# Patient Record
Sex: Female | Born: 1980 | Race: Black or African American | Hispanic: No | Marital: Single | Smoking: Former smoker
Health system: Southern US, Community
[De-identification: ages and names within clinical notes are randomized; demographics above are authoritative.]

## PROBLEM LIST (undated history)

## (undated) DIAGNOSIS — T7840XA Allergy, unspecified, initial encounter: Secondary | ICD-10-CM

## (undated) DIAGNOSIS — L509 Urticaria, unspecified: Secondary | ICD-10-CM

## (undated) DIAGNOSIS — A6000 Herpesviral infection of urogenital system, unspecified: Secondary | ICD-10-CM

## (undated) DIAGNOSIS — Z789 Other specified health status: Secondary | ICD-10-CM

## (undated) DIAGNOSIS — A599 Trichomoniasis, unspecified: Secondary | ICD-10-CM

## (undated) DIAGNOSIS — M1990 Unspecified osteoarthritis, unspecified site: Secondary | ICD-10-CM

## (undated) DIAGNOSIS — B999 Unspecified infectious disease: Secondary | ICD-10-CM

## (undated) DIAGNOSIS — D219 Benign neoplasm of connective and other soft tissue, unspecified: Secondary | ICD-10-CM

## (undated) HISTORY — PX: WISDOM TOOTH EXTRACTION: SHX21

## (undated) HISTORY — DX: Benign neoplasm of connective and other soft tissue, unspecified: D21.9

## (undated) HISTORY — PX: INDUCED ABORTION: SHX677

## (undated) HISTORY — DX: Allergy, unspecified, initial encounter: T78.40XA

## (undated) HISTORY — DX: Urticaria, unspecified: L50.9

---

## 2008-09-14 ENCOUNTER — Emergency Department (HOSPITAL_COMMUNITY): Admission: EM | Admit: 2008-09-14 | Discharge: 2008-09-14 | Payer: Self-pay | Admitting: Emergency Medicine

## 2009-04-10 ENCOUNTER — Emergency Department (HOSPITAL_COMMUNITY): Admission: EM | Admit: 2009-04-10 | Discharge: 2009-04-10 | Payer: Self-pay | Admitting: Emergency Medicine

## 2009-09-09 ENCOUNTER — Emergency Department (HOSPITAL_COMMUNITY): Admission: EM | Admit: 2009-09-09 | Discharge: 2009-09-09 | Payer: Self-pay | Admitting: Emergency Medicine

## 2010-04-10 ENCOUNTER — Ambulatory Visit (HOSPITAL_COMMUNITY): Admission: RE | Admit: 2010-04-10 | Discharge: 2010-04-10 | Payer: Self-pay | Admitting: Obstetrics

## 2010-11-29 LAB — URINALYSIS, ROUTINE W REFLEX MICROSCOPIC
Bilirubin Urine: NEGATIVE
Glucose, UA: NEGATIVE mg/dL
Protein, ur: 100 mg/dL — AB

## 2010-12-08 LAB — URINE MICROSCOPIC-ADD ON

## 2010-12-08 LAB — URINALYSIS, ROUTINE W REFLEX MICROSCOPIC
Glucose, UA: NEGATIVE mg/dL
Hgb urine dipstick: NEGATIVE
Ketones, ur: NEGATIVE mg/dL
Nitrite: NEGATIVE
Protein, ur: NEGATIVE mg/dL
Specific Gravity, Urine: 1.027 (ref 1.005–1.030)

## 2010-12-08 LAB — POCT PREGNANCY, URINE: Preg Test, Ur: NEGATIVE

## 2011-06-23 ENCOUNTER — Ambulatory Visit (INDEPENDENT_AMBULATORY_CARE_PROVIDER_SITE_OTHER): Payer: Medicaid Other

## 2011-06-23 ENCOUNTER — Inpatient Hospital Stay (INDEPENDENT_AMBULATORY_CARE_PROVIDER_SITE_OTHER)
Admission: RE | Admit: 2011-06-23 | Discharge: 2011-06-23 | Disposition: A | Discharge status: Home / Self Care | Payer: Medicaid Other | Source: Ambulatory Visit | Attending: Family Medicine | Admitting: Family Medicine

## 2011-06-23 DIAGNOSIS — J40 Bronchitis, not specified as acute or chronic: Secondary | ICD-10-CM

## 2011-06-23 DIAGNOSIS — J040 Acute laryngitis: Secondary | ICD-10-CM

## 2012-02-06 ENCOUNTER — Emergency Department (INDEPENDENT_AMBULATORY_CARE_PROVIDER_SITE_OTHER): Payer: Medicaid Other

## 2012-02-06 ENCOUNTER — Emergency Department (HOSPITAL_COMMUNITY)
Admission: EM | Admit: 2012-02-06 | Discharge: 2012-02-06 | Disposition: A | Discharge status: Home / Self Care | Payer: Medicaid Other | Source: Home / Self Care | Attending: Emergency Medicine | Admitting: Emergency Medicine

## 2012-02-06 ENCOUNTER — Encounter (HOSPITAL_COMMUNITY): Payer: Self-pay

## 2012-02-06 DIAGNOSIS — M25539 Pain in unspecified wrist: Secondary | ICD-10-CM

## 2012-02-06 DIAGNOSIS — Z202 Contact with and (suspected) exposure to infections with a predominantly sexual mode of transmission: Secondary | ICD-10-CM

## 2012-02-06 DIAGNOSIS — Z2089 Contact with and (suspected) exposure to other communicable diseases: Secondary | ICD-10-CM

## 2012-02-06 LAB — WET PREP, GENITAL
Trich, Wet Prep: NONE SEEN
WBC, Wet Prep HPF POC: NONE SEEN

## 2012-02-06 LAB — POCT URINALYSIS DIP (DEVICE)
Leukocytes, UA: NEGATIVE
Protein, ur: NEGATIVE mg/dL
pH: 8.5 — ABNORMAL HIGH (ref 5.0–8.0)

## 2012-02-06 LAB — POCT PREGNANCY, URINE: Preg Test, Ur: NEGATIVE

## 2012-02-06 MED ORDER — MELOXICAM 15 MG PO TABS: 15.0000 mg | ORAL_TABLET | Freq: Every day | ORAL | Status: DC

## 2012-02-06 NOTE — Discharge Instructions (Signed)
Arthralgia Your caregiver has diagnosed you as suffering from an arthralgia. Arthralgia means there is pain in a joint. This can come from many reasons including:  Bruising the joint which causes soreness (inflammation) in the joint.   Wear and tear on the joints which occur as we grow older (osteoarthritis).   Overusing the joint.   Various forms of arthritis.   Infections of the joint.  Regardless of the cause of pain in your joint, most of these different pains respond to anti-inflammatory drugs and rest. The exception to this is when a joint is infected, and these cases are treated with antibiotics, if it is a bacterial infection. HOME CARE INSTRUCTIONS   Rest the injured area for as long as directed by your caregiver. Then slowly start using the joint as directed by your caregiver and as the pain allows. Crutches as directed may be useful if the ankles, knees or hips are involved. If the knee was splinted or casted, continue use and care as directed. If an stretchy or elastic wrapping bandage has been applied today, it should be removed and re-applied every 3 to 4 hours. It should not be applied tightly, but firmly enough to keep swelling down. Watch toes and feet for swelling, bluish discoloration, coldness, numbness or excessive pain. If any of these problems (symptoms) occur, remove the ace bandage and re-apply more loosely. If these symptoms persist, contact your caregiver or return to this location.   For the first 24 hours, keep the injured extremity elevated on pillows while lying down.   Apply ice for 15 to 20 minutes to the sore joint every couple hours while awake for the first half day. Then 3 to 4 times per day for the first 48 hours. Put the ice in a plastic bag and place a towel between the bag of ice and your skin.   Wear any splinting, casting, elastic bandage applications, or slings as instructed.   Only take over-the-counter or prescription medicines for pain,  discomfort, or fever as directed by your caregiver. Do not use aspirin immediately after the injury unless instructed by your physician. Aspirin can cause increased bleeding and bruising of the tissues.   If you were given crutches, continue to use them as instructed and do not resume weight bearing on the sore joint until instructed.  Persistent pain and inability to use the sore joint as directed for more than 2 to 3 days are warning signs indicating that you should see a caregiver for a follow-up visit as soon as possible. Initially, a hairline fracture (break in bone) may not be evident on X-rays. Persistent pain and swelling indicate that further evaluation, non-weight bearing or use of the joint (use of crutches or slings as instructed), or further X-rays are indicated. X-rays may sometimes not show a small fracture until a week or 10 days later. Make a follow-up appointment with your own caregiver or one to whom we have referred you. A radiologist (specialist in reading X-rays) may read your X-rays. Make sure you know how you are to obtain your X-ray results. Do not assume everything is normal if you do not hear from us. SEEK MEDICAL CARE IF: Bruising, swelling, or pain increases. SEEK IMMEDIATE MEDICAL CARE IF:   Your fingers or toes are numb or blue.   The pain is not responding to medications and continues to stay the same or get worse.   The pain in your joint becomes severe.   You develop a fever over   102 F (38.9 C).   It becomes impossible to move or use the joint.  MAKE SURE YOU:   Understand these instructions.   Will watch your condition.   Will get help right away if you are not doing well or get worse.  Document Released: 08/10/2005 Document Revised: 07/30/2011 Document Reviewed: 03/28/2008 [facility] Patient Information 2012 [place], [place].Wrist Pain Wrist injuries are frequent in adults and children. A sprain is an injury to the ligaments that hold your bones together.  A strain is an injury to muscle or muscle cord-like structures (tendons) from stretching or pulling. Generally, when wrists are moderately tender to touch following a fall or injury, a break in the bone (fracture) may be present. Most wrist sprains or strains are better in 3 to 5 days, but complete healing may take several weeks. HOME CARE INSTRUCTIONS   Put ice on the injured area.   Put ice in a plastic bag.   Place a towel between your skin and the bag.   Leave the ice on for 15 to 20 minutes, 3 to 4 times a day, for the first 2 days.   Keep your arm raised above the level of your heart whenever possible to reduce swelling and pain.   Rest the injured area for at least 48 hours or as directed by your caregiver.   If a splint or elastic bandage has been applied, use it for as long as directed by your caregiver or until seen by a caregiver for a follow-up exam.   Only take over-the-counter or prescription medicines for pain, discomfort, or fever as directed by your caregiver.   Keep all follow-up appointments. You may need to follow up with a specialist or have follow-up X-rays. Improvement in pain level is not a guarantee that you did not fracture a bone in your wrist. The only way to determine whether or not you have a broken bone is by X-ray.  SEEK IMMEDIATE MEDICAL CARE IF:   Your fingers are swollen, very red, white, or cold and blue.   Your fingers are numb or tingling.   You have increasing pain.   You have difficulty moving your fingers.  MAKE SURE YOU:   Understand these instructions.   Will watch your condition.   Will get help right away if you are not doing well or get worse.  Document Released: 05/20/2005 Document Revised: 07/30/2011 Document Reviewed: 10/01/2010 [facility] Patient Information 2012 [place], [place].

## 2012-02-06 NOTE — ED Notes (Signed)
Pt states her partner in [place] called her and states he has STD and she wants to be checked, but she was seen and treated on Monday for BV at [facility].  She also has partner here in [place].  She also has painful lt wrist that woke her this am with numbness in her hand.

## 2012-02-06 NOTE — ED Provider Notes (Signed)
Chief Complaint  Patient presents with  . Exposure to STD    History of Present Illness:   The patient is a 31 year old female who comes in today for STD testing. She has 2 partners. Her boyfriend is no tongue. The last time they were together was a week ago. This past Thursday she had intercourse with a second partner without using protection or she's had some discharge and some odor. She had STD testing at [facility] 6 days ago and it turned out negative. She has had STD exposure since then without protection.  She also mentions today a three-month history of left wrist pain. She denies any injury or swelling. It pops and hurts. Sometimes her arm feels numb.  Review of Systems:  Other than noted above, the patient denies any of the following symptoms: Systemic:  No fever, chills, sweats, fatigue, or weight loss. GI:  No abdominal pain, nausea, anorexia, vomiting, diarrhea, constipation, melena or hematochezia. GU:  No dysuria, frequency, urgency, hematuria, vaginal discharge, itching, or abnormal vaginal bleeding. Skin:  No rash or itching.   PMFSH:  Past medical history, family history, social history, meds, and allergies were reviewed.  Physical Exam:   Vital signs:  BP 114/86  Pulse 60  Temp 99.5 F (37.5 C) (Oral)  Resp 16  SpO2 100%  LMP 01/25/2012 General:  Alert, oriented and in no distress. Lungs:  Breath sounds clear and equal bilaterally.  No wheezes, rales or rhonchi. Heart:  Regular rhythm.  No gallops or murmers. Abdomen:  Soft, flat and non-distended.  No organomegaly or mass.  No tenderness, guarding or rebound.  Bowel sounds normally active. Pelvic exam:  Normal external genitalia, vaginal and cervical mucosa unremarkable. No cervical motion tenderness. Uterus normal in size and nontender. No adnexal masses or tenderness. Skin:  Clear, warm and dry. Extremities: The left wrist was not swollen. There was tenderness to palpation of the dorsum of the wrist.  It has a full range of motion with no crepitus but there was some pain at the extremes of flexion and extension. No pain over the carpal tunnel and Tinel and Phalen signs are negative.  Labs:   Results for orders placed during the hospital encounter of 02/06/12  WET PREP, GENITAL      Component Value Range   Yeast Wet Prep HPF POC NONE SEEN  NONE SEEN   Trich, Wet Prep NONE SEEN  NONE SEEN   Clue Cells Wet Prep HPF POC NONE SEEN  NONE SEEN   WBC, Wet Prep HPF POC NONE SEEN  NONE SEEN  POCT URINALYSIS DIP (DEVICE)      Component Value Range   Glucose, UA NEGATIVE  NEGATIVE mg/dL   Bilirubin Urine NEGATIVE  NEGATIVE   Ketones, ur NEGATIVE  NEGATIVE mg/dL   Specific Gravity, Urine 1.020  1.005 - 1.030   Hgb urine dipstick NEGATIVE  NEGATIVE   pH 8.5 (*) 5.0 - 8.0   Protein, ur NEGATIVE  NEGATIVE mg/dL   Urobilinogen, UA 0.2  0.0 - 1.0 mg/dL   Nitrite NEGATIVE  NEGATIVE   Leukocytes, UA NEGATIVE  NEGATIVE  POCT PREGNANCY, URINE      Component Value Range   Preg Test, Ur NEGATIVE  NEGATIVE    Other Labs Obtained at Urgent Care Center:  GC and Chlamydia DNA probes were obtained as well as serologies for HIV and RPR.  Results are pending at this time and we will call about any positive results.  Assessment:  The  primary encounter diagnosis was Exposure to STD. A diagnosis of Wrist pain was also pertinent to this visit. I think her wrist pain is probably either tendinitis or cartilage injury. There is no sign that she has an STD at this time.  Plan:   1.  The following meds were prescribed:   New Prescriptions   MELOXICAM (MOBIC) 15 MG TABLET    Take 1 tablet (15 mg total) by mouth daily.   2.  The patient was instructed in symptomatic care and handouts were given. 3.  The patient was told to return if becoming worse in any way, if no better in 3 or 4 days, and given some red flag symptoms that would indicate earlier return.    Reuben Likes, MD 02/06/12 2225

## 2012-02-07 LAB — HIV ANTIBODY (ROUTINE TESTING W REFLEX): HIV: NONREACTIVE

## 2012-02-08 LAB — GONOCOCCUS CULTURE

## 2012-02-08 LAB — GC/CHLAMYDIA PROBE AMP, GENITAL
Chlamydia, DNA Probe: NEGATIVE
GC Probe Amp, Genital: NEGATIVE

## 2012-04-06 ENCOUNTER — Encounter (HOSPITAL_COMMUNITY): Payer: Self-pay | Admitting: *Deleted

## 2012-04-06 ENCOUNTER — Emergency Department (HOSPITAL_COMMUNITY): Payer: Medicaid Other

## 2012-04-06 ENCOUNTER — Emergency Department (HOSPITAL_COMMUNITY)
Admission: EM | Admit: 2012-04-06 | Discharge: 2012-04-06 | Disposition: A | Discharge status: Home / Self Care | Payer: Medicaid Other | Attending: Emergency Medicine | Admitting: Emergency Medicine

## 2012-04-06 DIAGNOSIS — IMO0002 Reserved for concepts with insufficient information to code with codable children: Secondary | ICD-10-CM | POA: Insufficient documentation

## 2012-04-06 DIAGNOSIS — S61209A Unspecified open wound of unspecified finger without damage to nail, initial encounter: Secondary | ICD-10-CM | POA: Insufficient documentation

## 2012-04-06 DIAGNOSIS — S62523A Displaced fracture of distal phalanx of unspecified thumb, initial encounter for closed fracture: Secondary | ICD-10-CM

## 2012-04-06 DIAGNOSIS — S62639A Displaced fracture of distal phalanx of unspecified finger, initial encounter for closed fracture: Secondary | ICD-10-CM | POA: Insufficient documentation

## 2012-04-06 DIAGNOSIS — S6992XA Unspecified injury of left wrist, hand and finger(s), initial encounter: Secondary | ICD-10-CM

## 2012-04-06 MED ORDER — CEPHALEXIN 500 MG PO CAPS: 500.0000 mg | ORAL_CAPSULE | Freq: Four times a day (QID) | ORAL | Status: AC

## 2012-04-06 MED ORDER — HYDROCODONE-ACETAMINOPHEN 5-325 MG PO TABS: 1.0000 | ORAL_TABLET | Freq: Four times a day (QID) | ORAL | Status: AC | PRN

## 2012-04-06 MED ORDER — HYDROCODONE-ACETAMINOPHEN 5-325 MG PO TABS
2.0000 | ORAL_TABLET | Freq: Once | ORAL | Status: AC
  Administered 2012-04-06: 2 via ORAL
  Filled 2012-04-06: qty 2

## 2012-04-06 NOTE — ED Provider Notes (Signed)
History     CSN: [id number]  Arrival date & time 04/06/12  0606   First MD Initiated Contact with Patient 04/06/12 [phone]      Chief Complaint  Patient presents with  . Hand Injury    (Consider location/radiation/quality/duration/timing/severity/associated sxs/prior treatment) HPI Comments: Hannah Fleming 31 y.o. female   The chief complaint is: Patient presents with:   Hand Injury   The patient has medical history significant for:   History reviewed. No pertinent past medical history.  Patient presents s/p slamming her left thumb in the door. She states that incident occurred at 1 am and she then went to sleep. At 5 am she awoke from sleep with pain and swelling of her left thumb. Associated symptoms are numbness and shooting pains from her midforearm to there thumb, index, and middle finger. Patient rates the pain 9/10. Denies fever or chills. Denies. NVD. Denies SOB, palpitations or CP.      Patient is a 31 y.o. female presenting with hand injury. The history is provided by the patient.  Hand Injury  Pertinent negatives include no fever.    History reviewed. No pertinent past medical history.  History reviewed. No pertinent past surgical history.  No family history on file.  History  Substance Use Topics  . Smoking status: Never Smoker   . Smokeless tobacco: Not on file  . Alcohol Use: Yes    OB History    Grav Para Term Preterm Abortions TAB SAB Ect Mult Living                  Review of Systems  Constitutional: Negative for fever, chills and diaphoresis.  Respiratory: Negative for shortness of breath.   Cardiovascular: Negative for chest pain and palpitations.  Gastrointestinal: Negative for nausea, vomiting and diarrhea.  Skin: Positive for color change.  All other systems reviewed and are negative.    Allergies  Latex  Home Medications   Current Outpatient Rx  Name Route Sig Dispense Refill  . HYDROCODONE-ACETAMINOPHEN 7.5-325 MG PO TABS  Oral Take 1 tablet by mouth every 6 (six) hours as needed. For menstrual  cramps    . IBUPROFEN 200 MG PO TABS Oral Take 200 mg by mouth every 6 (six) hours as needed. For menstual cramps      BP 112/72  Pulse 78  Temp 98.4 F (36.9 C) (Oral)  Resp 18  SpO2 99%  LMP 03/21/2012  Physical Exam  Nursing note and vitals reviewed. Constitutional: She appears well-developed and well-nourished. She appears distressed.  HENT:  Head: Normocephalic and atraumatic.  Mouth/Throat: Oropharynx is clear and moist.  Eyes: Conjunctivae and EOM are normal. No scleral icterus.  Neck: Normal range of motion. Neck supple.  Cardiovascular: Normal rate, regular rhythm, normal heart sounds and intact distal pulses.   Pulmonary/Chest: Effort normal and breath sounds normal.  Abdominal: Soft. Bowel sounds are normal. There is no tenderness.  Musculoskeletal:       Left wrist: She exhibits tenderness and swelling. She exhibits normal range of motion.       Arms: Neurological: She is alert.  Skin: Skin is warm and dry.    ED Course  NAIL REMOVAL Date/Time: 04/06/2012 12:57 PM Performed by: Nancie Neas, Samanta Gal Authorized by: Nancie Neas, [place] Name Consent: Verbal consent obtained. Risks and benefits: risks, benefits and alternatives were discussed Consent given by: patient Patient understanding: patient states understanding of the procedure being performed Patient consent: the patient's understanding of the procedure matches consent given Procedure consent: procedure consent  matches procedure scheduled Relevant documents: relevant documents present and verified Test results: test results available and properly labeled Site marked: the operative site was marked Imaging studies: imaging studies available Patient identity confirmed: verbally with patient Location: left hand Location details: left thumb Patient sedated: no Preparation: skin prepped with alcohol Dressing: antibiotic ointment, splint and tube  gauze Comments: Nail trephination performed successfully with electrocautery. No nail removed.   (including critical care time)  Labs Reviewed - No data to display   1. Injury of left thumb   2. Fracture of distal phalanx of thumb       MDM  Patient presented after slamming her thumb in the door at 1 am. Patient reported pain to be 9/10 with associated swelling and shooting pains. Patient given pain medication in ED, will reassess Imaging reveased a nondisplaced mildly comminuted fracture through the tuft of the distal phalanx. Nail trephination done with electrocautery. Left thumb will receive wound care, finger splint, and discharged on ABX. No red flags for paronychia, cellulitis, or felon. Return precautions given verbally and in discharge summary.        Pixie Casino, PA-C 04/06/12 1600

## 2012-04-06 NOTE — ED Notes (Signed)
MD at bedside. 

## 2012-04-06 NOTE — ED Notes (Signed)
Pt states that she closed her left thumb in her car door around 01:00 this morning. Pt states that she fell asleep with an ice pack and her pain woke her up. Pt does have bruised left thumb nail and finger tip. Pt able to wiggle thumb, sensation intact. Pt states that the pain is now radiating to her first and second finger.

## 2012-04-06 NOTE — Progress Notes (Signed)
Orthopedic Tech Progress Note Patient Details:  Imagine Nest 08-22-1981 [id number]  Ortho Devices Type of Ortho Device: Finger splint Ortho Device/Splint Location: left 1st Ortho Device/Splint Interventions: Application   Shauntell Iglesia T 04/06/2012, 10:32 AM

## 2012-04-06 NOTE — ED Notes (Signed)
Discharged home with instructions and prescriptions.  No questions or concerns at discharge.  Will follow up with ortho in 2 days

## 2012-04-06 NOTE — ED Provider Notes (Signed)
31 year old female closed a door on her left thumb yesterday. She presents with increased pain. On exam, there is a large subungual hematoma. X-ray shows a comminuted tuft fracture. No catheterization was done. She'll be placed in a thumb splint and placed on antibiotics and referred to hand surgery for followup.  Dione Booze, MD 04/06/12 [phone]

## 2012-04-06 NOTE — ED Notes (Signed)
Pt is here with left thumb infection, redness and pain that has extended to mid forearm .  Pt states she injured hand at 0100 by slamming thumb in car door.

## 2012-04-06 NOTE — ED Notes (Signed)
Patient returned from radiology

## 2012-04-07 NOTE — ED Provider Notes (Signed)
Medical screening examination/treatment/procedure(s) were conducted as a shared visit with non-physician practitioner(s) and myself.  I personally evaluated the patient during the encounter   Surina Storts, MD 04/07/12 0832 

## 2012-06-02 ENCOUNTER — Encounter (HOSPITAL_COMMUNITY): Payer: Self-pay | Admitting: *Deleted

## 2012-06-02 ENCOUNTER — Inpatient Hospital Stay (HOSPITAL_COMMUNITY)
Admission: AD | Admit: 2012-06-02 | Discharge: 2012-06-03 | Disposition: A | Discharge status: Home / Self Care | Payer: Medicaid Other | Source: Ambulatory Visit | Attending: Obstetrics | Admitting: Obstetrics

## 2012-06-02 DIAGNOSIS — O26899 Other specified pregnancy related conditions, unspecified trimester: Secondary | ICD-10-CM

## 2012-06-02 DIAGNOSIS — B9689 Other specified bacterial agents as the cause of diseases classified elsewhere: Secondary | ICD-10-CM | POA: Insufficient documentation

## 2012-06-02 DIAGNOSIS — O200 Threatened abortion: Secondary | ICD-10-CM | POA: Insufficient documentation

## 2012-06-02 DIAGNOSIS — N760 Acute vaginitis: Secondary | ICD-10-CM | POA: Insufficient documentation

## 2012-06-02 DIAGNOSIS — O2390 Unspecified genitourinary tract infection in pregnancy, unspecified trimester: Secondary | ICD-10-CM | POA: Insufficient documentation

## 2012-06-02 DIAGNOSIS — Z6791 Unspecified blood type, Rh negative: Secondary | ICD-10-CM

## 2012-06-02 DIAGNOSIS — A499 Bacterial infection, unspecified: Secondary | ICD-10-CM | POA: Insufficient documentation

## 2012-06-02 HISTORY — DX: Other specified health status: Z78.9

## 2012-06-02 NOTE — MAU Note (Signed)
PT SAYS SHE HAD TAB  [facility]- IN September- BUT WENT TO  [facility] [place]- BUT  DID NOT GO IN.---  THEY GAVE HER  WEEKS GEST ON THE PHONE.  PT HAS AN APPOINTMENT  WITH  [facility] AT 0945 IN AM.    SAYS SHE STARTED SPOTTING 2 WEEKS AGO-  THEN WENT AWAY  THEN 0830 THIS AM- HAD CRAMPS -  WENT TO B-ROOM - HAD SPOTTING.   THEN  AT 10PM TONIGHT  SHE WENT TO B-ROOM-  AND LARGE AMT BLEEDING.  HAS SLEPT ALL DAY.   HAS ON PANTY LINER NOW.-  SAYS SEES BLEEDING  NOW WHEN SHE WIPES.     NO PAIN - BUT FEELS HEAVY.   SAYS HAD H/A X1-2 WEEKS -  WAS TAKING IBUPROFEN.- WITHOUT RELIEF

## 2012-06-03 ENCOUNTER — Inpatient Hospital Stay (HOSPITAL_COMMUNITY): Payer: Medicaid Other

## 2012-06-03 ENCOUNTER — Encounter (HOSPITAL_COMMUNITY): Payer: Self-pay

## 2012-06-03 DIAGNOSIS — O200 Threatened abortion: Secondary | ICD-10-CM

## 2012-06-03 LAB — URINALYSIS, ROUTINE W REFLEX MICROSCOPIC
Bilirubin Urine: NEGATIVE
Nitrite: NEGATIVE
Specific Gravity, Urine: 1.02 (ref 1.005–1.030)
Urobilinogen, UA: 0.2 mg/dL (ref 0.0–1.0)
pH: 7 (ref 5.0–8.0)

## 2012-06-03 LAB — WET PREP, GENITAL
Trich, Wet Prep: NONE SEEN
Yeast Wet Prep HPF POC: NONE SEEN

## 2012-06-03 LAB — CBC
HCT: 35 % — ABNORMAL LOW (ref 36.0–46.0)
MCH: 29.7 pg (ref 26.0–34.0)
MCV: 87.3 fL (ref 78.0–100.0)
Platelets: 257 10*3/uL (ref 150–400)
RBC: 4.01 MIL/uL (ref 3.87–5.11)
WBC: 12 10*3/uL — ABNORMAL HIGH (ref 4.0–10.5)

## 2012-06-03 LAB — URINE MICROSCOPIC-ADD ON

## 2012-06-03 MED ORDER — METRONIDAZOLE 500 MG PO TABS: 500.0000 mg | ORAL_TABLET | Freq: Two times a day (BID) | ORAL | Status: DC

## 2012-06-03 MED ORDER — RHO D IMMUNE GLOBULIN 1500 UNIT/2ML IJ SOLN
300.0000 ug | Freq: Once | INTRAMUSCULAR | Status: AC
  Administered 2012-06-03: 300 ug via INTRAMUSCULAR

## 2012-06-03 MED ORDER — ACETAMINOPHEN 500 MG PO TABS
1000.0000 mg | ORAL_TABLET | Freq: Once | ORAL | Status: AC
  Administered 2012-06-03: 1000 mg via ORAL
  Filled 2012-06-03: qty 2

## 2012-06-03 MED ORDER — ONDANSETRON 8 MG PO TBDP
8.0000 mg | ORAL_TABLET | Freq: Once | ORAL | Status: AC
  Administered 2012-06-03: 8 mg via ORAL
  Filled 2012-06-03: qty 1

## 2012-06-03 MED ORDER — ACETAMINOPHEN-CODEINE #3 300-30 MG PO TABS: 1.0000 | ORAL_TABLET | ORAL | Status: DC | PRN

## 2012-06-03 NOTE — Progress Notes (Signed)
Small amount of bleeding noted 

## 2012-06-03 NOTE — MAU Provider Note (Signed)
Chief Complaint: No chief complaint on file.   First Provider Initiated Contact with Patient 06/03/12 0039     SUBJECTIVE HPI: Hannah Fleming is a 31 y.o. [id number] at [redacted]w[redacted]d by LMP who presents with spotting x several weeks, increased bleeding and cramping since last night. Rh neg. No [place] or bloodwork this pregnancy. Also reports nausea, no vomiting. Has planned EAB. Now scheduled to start prenatal care at [facility] tomorrow. Denies passage of tissue, vaginal discharge.   Past Medical History  Diagnosis Date  . No pertinent past medical history    OB History    Grav Para Term Preterm Abortions TAB SAB Ect Mult Living   7 3 3  3 3    3      # Outc Date GA Lbr Len/2nd Wgt Sex Del Anes PTL Lv   1 TRM     F SVD      2 TAB            3 TRM     M SVD      4 TRM     M SVD      5 TAB            6 TAB            7 CUR              Past Surgical History  Procedure Date  . Dilation and curettage of uterus    History   Social History  . Marital Status: Single    Spouse Name: N/A    Number of Children: N/A  . Years of Education: N/A   Occupational History  . Not on file.   Social History Main Topics  . Smoking status: Never Smoker   . Smokeless tobacco: Not on file  . Alcohol Use: No  . Drug Use: No  . Sexually Active: Yes    Birth Control/ Protection: None   Other Topics Concern  . Not on file   Social History Narrative  . No narrative on file   No current facility-administered medications on file prior to encounter.   Current Outpatient Prescriptions on File Prior to Encounter  Medication Sig Dispense Refill  . HYDROcodone-acetaminophen (NORCO) 7.5-325 MG per tablet Take 1 tablet by mouth every 6 (six) hours as needed. For menstrual  cramps       Allergies  Allergen Reactions  . Latex Itching and Rash    ROS: Pertinent items in HPI  OBJECTIVE Blood pressure 97/70, pulse 75, temperature 97.9 F (36.6 C), temperature source Oral, resp. rate 20, height 5\' 3"  (1.6 m),  weight 75.921 kg (167 lb 6 oz), last menstrual period 03/21/2012. GENERAL: Well-developed, well-nourished female in mild distress.  HEENT: Normocephalic HEART: normal rate RESP: normal effort ABDOMEN: Soft, non-tender EXTREMITIES: Nontender, no edema NEURO: Alert and oriented SPECULUM EXAM: NEFG, moderate amount of pink, malodorous discharge, cervix clean BIMANUAL: cervix long and closed; uterus normal size, no adnexal tenderness or masses  LAB RESULTS Results for orders placed during the hospital encounter of 06/02/12 (from the past 24 hour(s))  URINALYSIS, ROUTINE W REFLEX MICROSCOPIC     Status: Abnormal   Collection Time   06/02/12 11:15 PM      Component Value Range   Color, Urine YELLOW  YELLOW   APPearance CLEAR  CLEAR   Specific Gravity, Urine 1.020  1.005 - 1.030   pH 7.0  5.0 - 8.0   Glucose, UA NEGATIVE  NEGATIVE mg/dL   Hgb  urine dipstick LARGE (*) NEGATIVE   Bilirubin Urine NEGATIVE  NEGATIVE   Ketones, ur NEGATIVE  NEGATIVE mg/dL   Protein, ur NEGATIVE  NEGATIVE mg/dL   Urobilinogen, UA 0.2  0.0 - 1.0 mg/dL   Nitrite NEGATIVE  NEGATIVE   Leukocytes, UA NEGATIVE  NEGATIVE  URINE MICROSCOPIC-ADD ON     Status: Abnormal   Collection Time   06/02/12 11:15 PM      Component Value Range   Squamous Epithelial / LPF MANY (*) RARE   WBC, UA 0-2  <3 WBC/hpf   RBC / HPF 7-10  <3 RBC/hpf   Bacteria, UA RARE  RARE   Urine-Other MUCOUS PRESENT    POCT PREGNANCY, URINE     Status: Abnormal   Collection Time   06/03/12 12:16 AM      Component Value Range   Preg Test, Ur POSITIVE (*) NEGATIVE  ABO/RH     Status: Normal (Preliminary result)   Collection Time   06/03/12 12:45 AM      Component Value Range   ABO/RH(D) B NEG    HCG, QUANTITATIVE, PREGNANCY     Status: Abnormal   Collection Time   06/03/12 12:45 AM      Component Value Range   hCG, Beta Chain, Quant, S 13298 (*) <5 mIU/mL  CBC     Status: Abnormal   Collection Time   06/03/12 12:45 AM      Component  Value Range   WBC 12.0 (*) 4.0 - 10.5 K/uL   RBC 4.01  3.87 - 5.11 MIL/uL   Hemoglobin 11.9 (*) 12.0 - 15.0 g/dL   HCT 40.9 (*) 81.1 - 91.4 %   MCV 87.3  78.0 - 100.0 fL   MCH 29.7  26.0 - 34.0 pg   MCHC 34.0  30.0 - 36.0 g/dL   RDW 78.2  95.6 - 21.3 %   Platelets 257  150 - 400 K/uL  RH IG WORKUP (INCLUDES ABO/RH)     Status: Normal (Preliminary result)   Collection Time   06/03/12 12:45 AM      Component Value Range   Gestational Age(Wks) 10.4     ABO/RH(D) B NEG     Antibody Screen NEG     Unit Number [id number]     Blood Component Type RHIG     Unit division 00     Status of Unit ISSUED     Transfusion Status OK TO TRANSFUSE    WET PREP, GENITAL     Status: Abnormal   Collection Time   06/03/12  2:34 AM      Component Value Range   Yeast Wet Prep HPF POC NONE SEEN  NONE SEEN   Trich, Wet Prep NONE SEEN  NONE SEEN   Clue Cells Wet Prep HPF POC MODERATE (*) NONE SEEN   WBC, Wet Prep HPF POC FEW (*) NONE SEEN    IMAGING No results found.  MAU COURSE  ASSESSMENT 1. Threatened abortion, antepartum   2. BV (bacterial vaginosis)   3. Rh negative state in antepartum period   S<D, uncertain viability  PLAN Discharge home Pelvic rest SAB precautions F/U US at [facility] in 1 week. Urine culture, GC/CT pending     Follow-up Information    Follow up with [facility]. On 06/03/2012. (or MAU as needed if symptoms worsen)    Contact information:   [address] [place] [address] [phone]  Medication List     As of 06/03/2012  6:22 AM    STOP taking these medications         ibuprofen 200 MG tablet   Commonly known as: ADVIL,MOTRIN      TAKE these medications         acetaminophen-codeine 300-30 MG per tablet   Commonly known as: TYLENOL #3   Take 1-2 tablets by mouth every 4 (four) hours as needed for pain.      HYDROcodone-acetaminophen 7.5-325 MG per tablet   Commonly known as: NORCO   Take 1  tablet by mouth every 6 (six) hours as needed. For menstrual  cramps      metroNIDAZOLE 500 MG tablet   Commonly known as: FLAGYL   Take 1 tablet (500 mg total) by mouth 2 (two) times daily.        [place], CNM 06/03/2012  6:22 AM

## 2012-06-04 LAB — GC/CHLAMYDIA PROBE AMP, GENITAL
Chlamydia, DNA Probe: NEGATIVE
GC Probe Amp, Genital: NEGATIVE

## 2012-06-05 ENCOUNTER — Inpatient Hospital Stay (HOSPITAL_COMMUNITY)
Admission: AD | Admit: 2012-06-05 | Discharge: 2012-06-06 | Disposition: A | Discharge status: Home / Self Care | Payer: Medicaid Other | Source: Ambulatory Visit | Attending: Obstetrics and Gynecology | Admitting: Obstetrics and Gynecology

## 2012-06-05 ENCOUNTER — Encounter (HOSPITAL_COMMUNITY): Payer: Self-pay

## 2012-06-05 DIAGNOSIS — O021 Missed abortion: Secondary | ICD-10-CM | POA: Insufficient documentation

## 2012-06-05 DIAGNOSIS — O039 Complete or unspecified spontaneous abortion without complication: Secondary | ICD-10-CM

## 2012-06-05 LAB — RH IG WORKUP (INCLUDES ABO/RH)
ABO/RH(D): B NEG
Antibody Screen: NEGATIVE
Gestational Age(Wks): 10.4

## 2012-06-05 NOTE — MAU Note (Signed)
Pt states she was seen here 3 days ago for bleeding and u/s showed no FHB,, today started having heavy bleeding , worsening pain.

## 2012-06-05 NOTE — MAU Provider Note (Signed)
History     CSN: [id number]  Arrival date and time: 06/05/12 2242   First Provider Initiated Contact with Patient 06/05/12 2320      Chief Complaint  Patient presents with  . Abdominal Pain   HPI Hannah Fleming is a 31 y.o. female @ [redacted]w[redacted]d gestation by LMP who presents to MAU with vaginal bleeding. The bleeding started 3 days ago. She was evaluated here at that time and had an ultrasound that showed a 6 week 2 day IUP but no cardiac activity and moderate SCH. She is Rh negative and received Rhogam at that visit. Patient reports that she has continued to bleed but tonight got really heavy and started having severe cramping and passed a clot. She rates her pain as 10/10. Describes pain as feeling like contractions. Last sexual intercourse one week ago. The history was provided by the patient.   OB History    Grav Para Term Preterm Abortions TAB SAB Ect Mult Living   7 3 3  3 3    3       Past Medical History  Diagnosis Date  . No pertinent past medical history     Past Surgical History  Procedure Date  . Dilation and curettage of uterus     Family History  Problem Relation Age of Onset  . Other Neg Hx     History  Substance Use Topics  . Smoking status: Never Smoker   . Smokeless tobacco: Not on file  . Alcohol Use: No    Allergies:  Allergies  Allergen Reactions  . Latex Itching and Rash    Prescriptions prior to admission  Medication Sig Dispense Refill  . acetaminophen-codeine (TYLENOL #3) 300-30 MG per tablet Take 1-2 tablets by mouth every 4 (four) hours as needed for pain.  30 tablet  0  . HYDROcodone-acetaminophen (NORCO) 7.5-325 MG per tablet Take 1 tablet by mouth every 6 (six) hours as needed. For menstrual  cramps      . metroNIDAZOLE (FLAGYL) 500 MG tablet Take 1 tablet (500 mg total) by mouth 2 (two) times daily.  14 tablet  0    Review of Systems  Constitutional: Negative for fever, chills and weight loss.  HENT: Negative for ear pain,  nosebleeds, congestion, sore throat and neck pain.   Eyes: Negative for blurred vision, double vision, photophobia and pain.  Respiratory: Negative for cough, shortness of breath and wheezing.   Cardiovascular: Negative for chest pain, palpitations and leg swelling.  Gastrointestinal: Positive for abdominal pain. Negative for heartburn, nausea, vomiting, diarrhea and constipation.  Genitourinary: Negative for dysuria, urgency and frequency.       Vaginal bleeding   Musculoskeletal: Positive for back pain. Negative for myalgias.  Skin: Negative for itching and rash.  Neurological: Negative for dizziness, sensory change, speech change, seizures, weakness and headaches.  Endo/Heme/Allergies: Does not bruise/bleed easily.  Psychiatric/Behavioral: Negative for depression. The patient is not nervous/anxious and does not have insomnia.    Physical Exam   Blood pressure 111/70, pulse 89, temperature 98.7 F (37.1 C), temperature source Oral, resp. rate 20, last menstrual period 03/21/2012.  Physical Exam  Nursing note and vitals reviewed. Constitutional: She is oriented to person, place, and time. She appears well-developed and well-nourished. No distress.  HENT:  Head: Normocephalic and atraumatic.  Eyes: EOM are normal.  Neck: Neck supple.  Cardiovascular: Normal rate.   Respiratory: Effort normal.  GI: Soft. Normal appearance. There is tenderness in the right lower quadrant, suprapubic area and  left lower quadrant. There is no rigidity, no rebound, no guarding and no CVA tenderness.  Genitourinary:       External genitalia without lesions. Moderate blood vaginal vault. Cervix long, closed. Uterus slightly enlarged.   Musculoskeletal: Normal range of motion.  Neurological: She is alert and oriented to person, place, and time.  Skin: Skin is warm and dry.  Psychiatric: She has a normal mood and affect. Her behavior is normal. Judgment and thought content normal.    MAU Course: discussed  with Dr. Emelda Fear, will give Cytotec po and Pain management.   Procedures Assessment: 31 y.o. female with failed IUP  Plan:  Cytotec   Follow up in GYN Clinic in one week       Early Intrauterine Pregnancy Failure  _x__  Documented intrauterine pregnancy failure less than or equal to [redacted] weeks gestation  __x_  No serious current illness  _x__  Baseline Hgb greater than or equal to 10g/dl  _x__  Patient has easily accessible transportation to the hospital  __x_  Clear preference  _x__  Practitioner/physician deems patient reliable  _x__  Counseling by practitioner or physician  __x_  Patient education by RN  _x__  Consent form signed  __x_  Rho-Gam given by RN if indicated (given at last visit)  __x_ Medication dispensed   _x__   Cytotec 800 mcg  __   Intravaginally by patient at home         __   Intravaginally by RN in MAU        __   Rectally by patient at home        __   Oral by RN in MAU  __x_  Ibuprofen 600 mg 1 tablet by mouth every 6 hours as needed #30  _x__  Hydrocodone/acetaminophen 5/325 mg by mouth every 4 to 6 hours as needed  __x_  Phenergan 12.5 mg by mouth every 4 hours as needed for nausea  Discussed with the patient and all questioned fully answered. She will return if any problems arise.   Medication List     As of 06/06/2012  1:18 AM    START taking these medications         * HYDROcodone-acetaminophen 5-325 MG per tablet   Commonly known as: NORCO/VICODIN   Take 1 tablet by mouth every 4 (four) hours as needed for pain.      ibuprofen 600 MG tablet   Commonly known as: ADVIL,MOTRIN   Take 1 tablet (600 mg total) by mouth every 6 (six) hours as needed for pain.      promethazine 25 MG tablet   Commonly known as: PHENERGAN   Take 0.5 tablets (12.5 mg total) by mouth every 6 (six) hours as needed for nausea.     * Notice: This list has 1 medication(s) that are the same as other medications prescribed for you. Read the directions  carefully, and ask your doctor or other care provider to review them with you.    CONTINUE taking these medications         acetaminophen-codeine 300-30 MG per tablet   Commonly known as: TYLENOL #3   Take 1-2 tablets by mouth every 4 (four) hours as needed for pain.      metroNIDAZOLE 500 MG tablet   Commonly known as: FLAGYL   Take 1 tablet (500 mg total) by mouth 2 (two) times daily.      ASK your doctor about these medications         *  HYDROcodone-acetaminophen 7.5-325 MG per tablet   Commonly known as: NORCO     * Notice: This list has 1 medication(s) that are the same as other medications prescribed for you. Read the directions carefully, and ask your doctor or other care provider to review them with you.        Where to get your medications    These are the prescriptions that you need to pick up.   You may get these medications from any pharmacy.         HYDROcodone-acetaminophen 5-325 MG per tablet   ibuprofen 600 MG tablet   promethazine 25 MG tablet             NEESE,HOPE, RN, FNP, BC 06/06/2012, 12:11 AM

## 2012-06-06 ENCOUNTER — Encounter (HOSPITAL_COMMUNITY): Payer: Self-pay | Admitting: *Deleted

## 2012-06-06 LAB — CBC
HCT: 33 % — ABNORMAL LOW (ref 36.0–46.0)
Hemoglobin: 11.1 g/dL — ABNORMAL LOW (ref 12.0–15.0)
MCH: 29.5 pg (ref 26.0–34.0)
MCV: 87.8 fL (ref 78.0–100.0)
Platelets: 248 10*3/uL (ref 150–400)
RBC: 3.76 MIL/uL — ABNORMAL LOW (ref 3.87–5.11)
WBC: 14.3 10*3/uL — ABNORMAL HIGH (ref 4.0–10.5)

## 2012-06-06 MED ORDER — MISOPROSTOL 200 MCG PO TABS
800.0000 ug | ORAL_TABLET | Freq: Once | ORAL | Status: AC
  Administered 2012-06-06: 800 ug via ORAL
  Filled 2012-06-06: qty 4

## 2012-06-06 MED ORDER — KETOROLAC TROMETHAMINE 60 MG/2ML IM SOLN
60.0000 mg | Freq: Once | INTRAMUSCULAR | Status: AC
  Administered 2012-06-06: 60 mg via INTRAMUSCULAR
  Filled 2012-06-06: qty 2

## 2012-06-06 MED ORDER — HYDROCODONE-ACETAMINOPHEN 5-325 MG PO TABS: 1.0000 | ORAL_TABLET | ORAL | Status: DC | PRN

## 2012-06-06 MED ORDER — IBUPROFEN 600 MG PO TABS: 600.0000 mg | ORAL_TABLET | Freq: Four times a day (QID) | ORAL | Status: DC | PRN

## 2012-06-06 MED ORDER — PROMETHAZINE HCL 25 MG PO TABS: 12.5000 mg | ORAL_TABLET | Freq: Four times a day (QID) | ORAL | Status: DC | PRN

## 2012-06-07 NOTE — MAU Provider Note (Signed)
Attestation of Attending Supervision of Advanced Practitioner: Evaluation and management procedures were performed by the PA/NP/CNM/OB Fellow under my supervision/collaboration. Chart reviewed and agree with management and plan.  Ronold Hardgrove V 06/07/2012 7:58 AM

## 2012-06-13 ENCOUNTER — Ambulatory Visit (INDEPENDENT_AMBULATORY_CARE_PROVIDER_SITE_OTHER): Payer: Self-pay | Admitting: Obstetrics & Gynecology

## 2012-06-13 ENCOUNTER — Encounter: Payer: Self-pay | Admitting: Obstetrics & Gynecology

## 2012-06-13 VITALS — BP 108/76 | HR 76 | Temp 98.3°F | Ht 64.0 in | Wt 167.4 lb

## 2012-06-13 DIAGNOSIS — Z3009 Encounter for other general counseling and advice on contraception: Secondary | ICD-10-CM

## 2012-06-13 DIAGNOSIS — O039 Complete or unspecified spontaneous abortion without complication: Secondary | ICD-10-CM

## 2012-06-13 DIAGNOSIS — Z3049 Encounter for surveillance of other contraceptives: Secondary | ICD-10-CM

## 2012-06-13 MED ORDER — MEDROXYPROGESTERONE ACETATE 150 MG/ML IM SUSP
150.0000 mg | Freq: Once | INTRAMUSCULAR | Status: AC
  Administered 2012-06-13: 150 mg via INTRAMUSCULAR

## 2012-06-13 NOTE — Progress Notes (Signed)
Subjective:     Patient ID: Hannah Fleming, female   DOB: 03-11-1981, 31 y.o.   MRN: [id number]  HPI Pt s/p SAB 2 weeks prevoiusly.  [place] f/u.  Pt denies bleeding. No abd or pelvic pain.  Wants to restart Depo Provera.  Has been on this previously with no problems.  Review of Systems     Objective:   Physical ExamBP 108/76  Pulse 76  Temp 98.3 F (36.8 C) (Oral)  Ht 5\' 4"  (1.626 m)  Wt 167 lb 6.4 oz (75.932 kg)  BMI 28.73 kg/m2  LMP 03/21/2012  Abd: soft, NT, ND     Assessment:     S/p SAB @ 6weeks- no problems Contraception counseling- will begin Depo Provera today    Plan:     Need records from prev OB/GYN for results and date of PAP    Depo Provera 150mg  IM today F/u 12 weeks for next Depo  Foxx Klarich L. Harraway-Smith, M.D., Evern Core

## 2012-06-13 NOTE — Patient Instructions (Addendum)
Miscarriage A miscarriage is the sudden loss of an unborn baby (fetus) before the 20th week of pregnancy. Most miscarriages happen in the first 3 months of pregnancy. Sometimes, it happens before a woman even knows she is pregnant. A miscarriage is also called a "spontaneous miscarriage" or "early pregnancy loss." Having a miscarriage can be an emotional experience. Talk with your caregiver about any questions you may have about miscarrying, the grieving process, and your future pregnancy plans. CAUSES   Problems with the fetal chromosomes that make it impossible for the baby to develop normally. Problems with the baby's genes or chromosomes are most often the result of errors that occur, by chance, as the embryo divides and grows. The problems are not inherited from the parents.  Infection of the cervix or uterus.   Hormone problems.   Problems with the cervix, such as having an incompetent cervix. This is when the tissue in the cervix is not strong enough to hold the pregnancy.   Problems with the uterus, such as an abnormally shaped uterus, uterine fibroids, or congenital abnormalities.   Certain medical conditions.   Smoking, drinking alcohol, or taking illegal drugs.   Trauma.  Often, the cause of a miscarriage is unknown.  SYMPTOMS   Vaginal bleeding or spotting, with or without cramps or pain.  Pain or cramping in the abdomen or lower back.  Passing fluid, tissue, or blood clots from the vagina. DIAGNOSIS  Your caregiver will perform a physical exam. You may also have an ultrasound to confirm the miscarriage. Blood or urine tests may also be ordered. TREATMENT   Sometimes, treatment is not necessary if you naturally pass all the fetal tissue that was in the uterus. If some of the fetus or placenta remains in the body (incomplete miscarriage), tissue left behind may become infected and must be removed. Usually, a dilation and curettage (D and C) procedure is performed.  During a D and C procedure, the cervix is widened (dilated) and any remaining fetal or placental tissue is gently removed from the uterus.  Antibiotic medicines are prescribed if there is an infection. Other medicines may be given to reduce the size of the uterus (contract) if there is a lot of bleeding.  If you have Rh negative blood and your baby was Rh positive, you will need a Rh immunoglobulin shot. This shot will protect any future baby from having Rh blood problems in future pregnancies. HOME CARE INSTRUCTIONS   Your caregiver may order bed rest or may allow you to continue light activity. Resume activity as directed by your caregiver.  Have someone help with home and family responsibilities during this time.   Keep track of the number of sanitary pads you use each day and how soaked (saturated) they are. Write down this information.   Do not use tampons. Do not douche or have sexual intercourse until approved by your caregiver.   Only take over-the-counter or prescription medicines for pain or discomfort as directed by your caregiver.   Do not take aspirin. Aspirin can cause bleeding.   Keep all follow-up appointments with your caregiver.   If you or your partner have problems with grieving, talk to your caregiver or seek counseling to help cope with the pregnancy loss. Allow enough time to grieve before trying to get pregnant again.  SEEK IMMEDIATE MEDICAL CARE IF:   You have severe cramps or pain in your back or abdomen.  You have a fever.  You pass large blood clots (walnut-sized   or larger) ortissue from your vagina. Save any tissue for your caregiver to inspect.   Your bleeding increases.   You have a thick, bad-smelling vaginal discharge.  You become lightheaded, weak, or you faint.   You have chills.  MAKE SURE YOU:  Understand these instructions.  Will watch your condition.  Will get help right away if you are not doing well or get  worse. Document Released: 02/03/2001 Document Revised: 02/09/2012 Document Reviewed: 09/29/2011 [facility] Patient Information 2013 [place], [place]. Contraception Choices Contraception (birth control) is the use of any methods or devices to prevent pregnancy. Below are some methods to help avoid pregnancy. HORMONAL METHODS   Contraceptive implant. This is a thin, plastic tube containing progesterone hormone. It does not contain estrogen hormone. Your caregiver inserts the tube in the inner part of the upper arm. The tube can remain in place for up to 3 years. After 3 years, the implant must be removed. The implant prevents the ovaries from releasing an egg (ovulation), thickens the cervical mucus which prevents sperm from entering the uterus, and thins the lining of the inside of the uterus.  Progesterone-only injections. These injections are given every 3 months by your caregiver to prevent pregnancy. This synthetic progesterone hormone stops the ovaries from releasing eggs. It also thickens cervical mucus and changes the uterine lining. This makes it harder for sperm to survive in the uterus.  Birth control pills. These pills contain estrogen and progesterone hormone. They work by stopping the egg from forming in the ovary (ovulation). Birth control pills are prescribed by a caregiver.Birth control pills can also be used to treat heavy periods.  Minipill. This type of birth control pill contains only the progesterone hormone. They are taken every day of each month and must be prescribed by your caregiver.  Birth control patch. The patch contains hormones similar to those in birth control pills. It must be changed once a week and is prescribed by a caregiver.  Vaginal ring. The ring contains hormones similar to those in birth control pills. It is left in the vagina for 3 weeks, removed for 1 week, and then a new one is put back in place. The patient must be comfortable inserting and removing the  ring from the vagina.A caregiver's prescription is necessary.  Emergency contraception. Emergency contraceptives prevent pregnancy after unprotected sexual intercourse. This pill can be taken right after sex or up to 5 days after unprotected sex. It is most effective the sooner you take the pills after having sexual intercourse. Emergency contraceptive pills are available without a prescription. Check with your pharmacist. Do not use emergency contraception as your only form of birth control. BARRIER METHODS   Female condom. This is a thin sheath (latex or rubber) that is worn over the penis during sexual intercourse. It can be used with spermicide to increase effectiveness.  Female condom. This is a soft, loose-fitting sheath that is put into the vagina before sexual intercourse.  Diaphragm. This is a soft, latex, dome-shaped barrier that must be fitted by a caregiver. It is inserted into the vagina, along with a spermicidal jelly. It is inserted before intercourse. The diaphragm should be left in the vagina for 6 to 8 hours after intercourse.  Cervical cap. This is a round, soft, latex or plastic cup that fits over the cervix and must be fitted by a caregiver. The cap can be left in place for up to 48 hours after intercourse.  Sponge. This is a soft, circular piece  of polyurethane foam. The sponge has spermicide in it. It is inserted into the vagina after wetting it and before sexual intercourse.  Spermicides. These are chemicals that kill or block sperm from entering the cervix and uterus. They come in the form of creams, jellies, suppositories, foam, or tablets. They do not require a prescription. They are inserted into the vagina with an applicator before having sexual intercourse. The process must be repeated every time you have sexual intercourse. INTRAUTERINE CONTRACEPTION  Intrauterine device (IUD). This is a T-shaped device that is put in a woman's uterus during a menstrual period to  prevent pregnancy. There are 2 types:  Copper IUD. This type of IUD is wrapped in copper wire and is placed inside the uterus. Copper makes the uterus and fallopian tubes produce a fluid that kills sperm. It can stay in place for 10 years.  Hormone IUD. This type of IUD contains the hormone progestin (synthetic progesterone). The hormone thickens the cervical mucus and prevents sperm from entering the uterus, and it also thins the uterine lining to prevent implantation of a fertilized egg. The hormone can weaken or kill the sperm that get into the uterus. It can stay in place for 5 years. PERMANENT METHODS OF CONTRACEPTION  Female tubal ligation. This is when the woman's fallopian tubes are surgically sealed, tied, or blocked to prevent the egg from traveling to the uterus.  Female sterilization. This is when the female has the tubes that carry sperm tied off (vasectomy).This blocks sperm from entering the vagina during sexual intercourse. After the procedure, the man can still ejaculate fluid (semen). NATURAL PLANNING METHODS  Natural family planning. This is not having sexual intercourse or using a barrier method (condom, diaphragm, cervical cap) on days the woman could become pregnant.  Calendar method. This is keeping track of the length of each menstrual cycle and identifying when you are fertile.  Ovulation method. This is avoiding sexual intercourse during ovulation.  Symptothermal method. This is avoiding sexual intercourse during ovulation, using a thermometer and ovulation symptoms.  Post-ovulation method. This is timing sexual intercourse after you have ovulated. Regardless of which type or method of contraception you choose, it is important that you use condoms to protect against the transmission of sexually transmitted diseases (STDs). Talk with your caregiver about which form of contraception is most appropriate for you. Document Released: 08/10/2005 Document Revised: 11/02/2011  Document Reviewed: 12/17/2010 [facility] Patient Information 2013 [place], [place].

## 2012-06-20 ENCOUNTER — Encounter: Payer: Medicaid Other | Admitting: Obstetrics & Gynecology

## 2013-01-30 ENCOUNTER — Ambulatory Visit (INDEPENDENT_AMBULATORY_CARE_PROVIDER_SITE_OTHER): Payer: Medicaid Other | Admitting: Obstetrics & Gynecology

## 2013-01-30 ENCOUNTER — Encounter: Payer: Self-pay | Admitting: Obstetrics & Gynecology

## 2013-01-30 VITALS — BP 156/81 | HR 71 | Temp 98.8°F

## 2013-01-30 DIAGNOSIS — R300 Dysuria: Secondary | ICD-10-CM

## 2013-01-30 DIAGNOSIS — B3731 Acute candidiasis of vulva and vagina: Secondary | ICD-10-CM

## 2013-01-30 DIAGNOSIS — B373 Candidiasis of vulva and vagina: Secondary | ICD-10-CM

## 2013-01-30 LAB — POCT URINALYSIS DIPSTICK
Bilirubin, UA: NEGATIVE
Blood, UA: NEGATIVE
Glucose, UA: NEGATIVE
Ketones, UA: NEGATIVE
Nitrite, UA: NEGATIVE
Spec Grav, UA: 1.01
pH, UA: 7

## 2013-01-30 LAB — POCT WET PREP (WET MOUNT)

## 2013-01-30 MED ORDER — TERCONAZOLE 0.4 % VA CREA: 1.0000 | TOPICAL_CREAM | Freq: Every day | VAGINAL | Status: DC

## 2013-01-30 NOTE — Progress Notes (Signed)
Subjective:     Hannah Fleming is a 32 y.o. female here for a routine exam.  Current complaints: Patient c/o itching and irritation.Patinet states she has some swelling in the vulva     Gynecologic History Patient's last menstrual period was 01/01/2013. Contraception: none Last Pap: due now. Results were: normal  Obstetric History OB History   Grav Para Term Preterm Abortions TAB SAB Ect Mult Living   7 3 3  3 3    3      # Outc Date GA Lbr Len/2nd Wgt Sex Del Anes PTL Lv   1 TRM     F SVD      2 TAB            3 TRM     M SVD      4 TRM     M SVD      5 TAB            6 TAB            7 GRA            Comments: System Generated. Please review and update pregnancy details.       The following portions of the patient's history were reviewed and updated as appropriate: allergies, current medications, past family history, past medical history, past social history, past surgical history and problem list.  Review of Systems Pertinent items are noted in HPI.    Objective:    Pelvic: cervix normal in appearance, external genitalia normal and white vaginal discharge    Assessment:   Likely candida vulvovaginitis  Plan:   Terazol Return prn

## 2013-01-30 NOTE — Patient Instructions (Signed)
Candidal Vulvovaginitis  Candidal vulvovaginitis is an infection of the vagina and vulva. The vulva is the skin around the opening of the vagina. This may cause itching and discomfort in and around the vagina.   HOME CARE  · Only take medicine as told by your doctor.  · Do not have sex (intercourse) until the infection is healed or as told by your doctor.  · Practice safe sex.  · Tell your sex partner about your infection.  · Do not douche or use tampons.  · Wear cotton underwear. Do not wear tight pants or panty hose.  · Eat yogurt. This may help treat and prevent yeast infections.  GET HELP RIGHT AWAY IF:   · You have a fever.  · Your problems get worse during treatment or do not get better in 3 days.  · You have discomfort, irritation, or itching in your vagina or vulva area.  · You have pain after sex.  · You start to get belly (abdominal) pain.  MAKE SURE YOU:  · Understand these instructions.  · Will watch your condition.  · Will get help right away if you are not doing well or get worse.  Document Released: 11/06/2008 Document Revised: 11/02/2011 Document Reviewed: 11/06/2008  [employer]® Patient Information ©2014 [employer].

## 2013-01-31 LAB — GC/CHLAMYDIA PROBE AMP: GC Probe RNA: NEGATIVE

## 2013-01-31 LAB — WET PREP BY MOLECULAR PROBE
Candida species: POSITIVE — AB
Trichomonas vaginosis: NEGATIVE

## 2013-02-07 ENCOUNTER — Encounter: Payer: Self-pay | Admitting: Obstetrics

## 2013-02-07 ENCOUNTER — Ambulatory Visit (INDEPENDENT_AMBULATORY_CARE_PROVIDER_SITE_OTHER): Payer: Medicaid Other | Admitting: Obstetrics

## 2013-02-07 VITALS — BP 117/68 | HR 87 | Temp 97.9°F | Wt 170.0 lb

## 2013-02-07 DIAGNOSIS — Z3202 Encounter for pregnancy test, result negative: Secondary | ICD-10-CM

## 2013-02-07 DIAGNOSIS — F419 Anxiety disorder, unspecified: Secondary | ICD-10-CM | POA: Insufficient documentation

## 2013-02-07 DIAGNOSIS — F411 Generalized anxiety disorder: Secondary | ICD-10-CM

## 2013-02-07 DIAGNOSIS — N946 Dysmenorrhea, unspecified: Secondary | ICD-10-CM | POA: Insufficient documentation

## 2013-02-07 DIAGNOSIS — Z0000 Encounter for general adult medical examination without abnormal findings: Secondary | ICD-10-CM

## 2013-02-07 MED ORDER — IBUPROFEN 800 MG PO TABS: 800.0000 mg | ORAL_TABLET | Freq: Three times a day (TID) | ORAL | Status: DC | PRN

## 2013-02-07 MED ORDER — CLONAZEPAM 0.5 MG PO TABS: 0.5000 mg | ORAL_TABLET | Freq: Two times a day (BID) | ORAL | Status: DC

## 2013-02-07 NOTE — Addendum Note (Signed)
Addended by: Julaine Hua on: 02/07/2013 05:15 PM   Modules accepted: Orders

## 2013-02-07 NOTE — Progress Notes (Signed)
Subjective:     Hannah Fleming is a 32 y.o. female here for referral.  Current complaints: anxiety.  Personal health questionnaire reviewed: not asked.   Gynecologic History Patient's last menstrual period was 01/01/2013. Contraception: none Last Pap: 2014. Results were: unknown   Obstetric History OB History   Grav Para Term Preterm Abortions TAB SAB Ect Mult Living   7 3 3  3 3    3      # Outc Date GA Lbr Len/2nd Wgt Sex Del Anes PTL Lv   1 TRM     F SVD      2 TAB            3 TRM     M SVD      4 TRM     M SVD      5 TAB            6 TAB            7 GRA            Comments: System Generated. Please review and update pregnancy details.       The following portions of the patient's history were reviewed and updated as appropriate: allergies, current medications, past family history, past medical history, past social history, past surgical history and problem list.  Review of Systems Pertinent items are noted in HPI.    Objective:    General appearance: alert and no distress    No PE done.  Consult visit only.  Assessment:    Anxiety/Depression.   Plan:    Education reviewed: Management of anxiety.. Follow up in: several months. Klonopin Rx.   Referred to PMD for general medical care.

## 2013-02-28 ENCOUNTER — Ambulatory Visit: Payer: Medicaid Other | Admitting: Obstetrics

## 2013-05-29 ENCOUNTER — Ambulatory Visit: Payer: Self-pay

## 2013-06-01 ENCOUNTER — Encounter: Payer: Self-pay | Admitting: Obstetrics

## 2013-08-01 ENCOUNTER — Ambulatory Visit (INDEPENDENT_AMBULATORY_CARE_PROVIDER_SITE_OTHER): Payer: Medicaid Other | Admitting: Obstetrics

## 2013-08-01 ENCOUNTER — Encounter: Payer: Self-pay | Admitting: Obstetrics

## 2013-08-01 VITALS — BP 111/74 | HR 84 | Temp 98.5°F | Wt 176.0 lb

## 2013-08-01 DIAGNOSIS — B373 Candidiasis of vulva and vagina: Secondary | ICD-10-CM

## 2013-08-01 DIAGNOSIS — B3731 Acute candidiasis of vulva and vagina: Secondary | ICD-10-CM

## 2013-08-01 DIAGNOSIS — R1032 Left lower quadrant pain: Secondary | ICD-10-CM

## 2013-08-01 DIAGNOSIS — H6691 Otitis media, unspecified, right ear: Secondary | ICD-10-CM

## 2013-08-01 DIAGNOSIS — N760 Acute vaginitis: Secondary | ICD-10-CM

## 2013-08-01 DIAGNOSIS — N926 Irregular menstruation, unspecified: Secondary | ICD-10-CM

## 2013-08-01 DIAGNOSIS — H6690 Otitis media, unspecified, unspecified ear: Secondary | ICD-10-CM

## 2013-08-01 DIAGNOSIS — N912 Amenorrhea, unspecified: Secondary | ICD-10-CM

## 2013-08-01 LAB — CBC WITH DIFFERENTIAL/PLATELET
Eosinophils Relative: 2 % (ref 0–5)
HCT: 37.8 % (ref 36.0–46.0)
Hemoglobin: 13 g/dL (ref 12.0–15.0)
Lymphocytes Relative: 29 % (ref 12–46)
Lymphs Abs: 2.9 10*3/uL (ref 0.7–4.0)
MCV: 87.1 fL (ref 78.0–100.0)
Monocytes Absolute: 0.7 10*3/uL (ref 0.1–1.0)
Monocytes Relative: 7 % (ref 3–12)
Neutro Abs: 6.5 10*3/uL (ref 1.7–7.7)
WBC: 10.3 10*3/uL (ref 4.0–10.5)

## 2013-08-01 LAB — POCT URINE PREGNANCY: Preg Test, Ur: NEGATIVE

## 2013-08-01 MED ORDER — KETOROLAC TROMETHAMINE 60 MG/2ML IM SOLN
60.0000 mg | Freq: Once | INTRAMUSCULAR | Status: AC
  Administered 2013-08-01: 60 mg via INTRAMUSCULAR

## 2013-08-01 MED ORDER — AZITHROMYCIN 250 MG PO TABS: ORAL_TABLET | ORAL | Status: DC

## 2013-08-01 NOTE — Progress Notes (Signed)
Subjective:     Hannah Fleming is a 32 y.o. female here for a problem exam.  Current complaints: pt states that she has not had a menstral cycle since October 17,2014.  Pt states that has had unprotected intercourse once since occurring on 07/22/13 .  Pt states that she has had 2 negative home pregnancy test.  Pt states that she is also having a lot of left sided pain.  Pt states it is more of a constant flank pain.  Pt associates it with what would normally be the start of her cycle but has yet to start.  Pt states that she is also having right ear pain.   Personal health questionnaire reviewed: yes.   Gynecologic History Patient's last menstrual period was 06/09/2013. Contraception: condoms Last Pap: unsure. Results were: normal   Obstetric History OB History  Gravida Para Term Preterm AB SAB TAB Ectopic Multiple Living  7 3 3  3  3   3     # Outcome Date GA Lbr Len/2nd Weight Sex Delivery Anes PTL Lv  7 TAB           6 TAB           5 TRM     M SVD     4 TRM     M SVD     3 TAB           2 TRM     F SVD     1 GRA              Comments: System Generated. Please review and update pregnancy details.       The following portions of the patient's history were reviewed and updated as appropriate: allergies, current medications, past family history, past medical history, past social history, past surgical history and problem list.  Review of Systems Pertinent items are noted in HPI.    Objective:    General appearance: alert and no distress Abdomen: normal findings: soft, non-tender Pelvic: cervix normal in appearance, external genitalia normal, no adnexal masses or tenderness, no cervical motion tenderness, rectovaginal septum normal, uterus normal size, shape, and consistency and vagina normal without discharge    Assessment:    Amenorrhea.  Otitis    Plan:    Education reviewed: safe sex/STD prevention and management of amenorrhea. Follow up in: 2 weeks. Labs  ordered Azithromycin Rx Ultrasound ordered Ultrasound ordered

## 2013-08-02 ENCOUNTER — Encounter: Payer: Self-pay | Admitting: Obstetrics

## 2013-08-02 ENCOUNTER — Other Ambulatory Visit: Payer: Self-pay | Admitting: *Deleted

## 2013-08-02 DIAGNOSIS — B9689 Other specified bacterial agents as the cause of diseases classified elsewhere: Secondary | ICD-10-CM

## 2013-08-02 LAB — WET PREP BY MOLECULAR PROBE: Candida species: NEGATIVE

## 2013-08-02 LAB — COMPREHENSIVE METABOLIC PANEL
AST: 12 U/L (ref 0–37)
Albumin: 4.3 g/dL (ref 3.5–5.2)
BUN: 14 mg/dL (ref 6–23)
CO2: 28 mEq/L (ref 19–32)
Calcium: 9.6 mg/dL (ref 8.4–10.5)
Chloride: 102 mEq/L (ref 96–112)
Creat: 0.78 mg/dL (ref 0.50–1.10)
Glucose, Bld: 94 mg/dL (ref 70–99)
Potassium: 3.6 mEq/L (ref 3.5–5.3)

## 2013-08-02 LAB — GC/CHLAMYDIA PROBE AMP
CT Probe RNA: NEGATIVE
GC Probe RNA: NEGATIVE

## 2013-08-02 MED ORDER — METRONIDAZOLE 500 MG PO TABS: 500.0000 mg | ORAL_TABLET | Freq: Two times a day (BID) | ORAL | Status: DC

## 2013-08-08 ENCOUNTER — Ambulatory Visit (INDEPENDENT_AMBULATORY_CARE_PROVIDER_SITE_OTHER): Payer: Medicaid Other

## 2013-08-08 ENCOUNTER — Other Ambulatory Visit: Payer: Self-pay | Admitting: Obstetrics

## 2013-08-08 DIAGNOSIS — N946 Dysmenorrhea, unspecified: Secondary | ICD-10-CM

## 2013-08-08 DIAGNOSIS — N912 Amenorrhea, unspecified: Secondary | ICD-10-CM

## 2013-08-08 DIAGNOSIS — N915 Oligomenorrhea, unspecified: Secondary | ICD-10-CM

## 2013-08-15 ENCOUNTER — Ambulatory Visit: Payer: Medicaid Other | Admitting: Obstetrics

## 2013-08-21 ENCOUNTER — Telehealth: Payer: Self-pay | Admitting: *Deleted

## 2013-08-21 NOTE — Telephone Encounter (Signed)
Patient called and left voice message stating she believes she may have had a reaction to the medication she was given on  12/10/204. She states her bottom is raw and itching , and her vaginal discharge thick and unchanged. She was informed that she missed a follow up appointment on 08/08/2013, and was advised to schedule for evaluation.  Patient verbalized understanding and agrees as instructed. Patient was scheduled for 01/06/215 for evaluation with Dr Clearance Coots.

## 2013-08-28 ENCOUNTER — Encounter: Payer: Self-pay | Admitting: Obstetrics

## 2013-08-29 ENCOUNTER — Encounter: Payer: Self-pay | Admitting: Obstetrics

## 2013-08-29 ENCOUNTER — Ambulatory Visit (INDEPENDENT_AMBULATORY_CARE_PROVIDER_SITE_OTHER): Payer: Medicaid Other | Admitting: Obstetrics

## 2013-08-29 VITALS — BP 127/80 | HR 72 | Temp 97.7°F | Ht 64.0 in | Wt 177.0 lb

## 2013-08-29 DIAGNOSIS — B373 Candidiasis of vulva and vagina: Secondary | ICD-10-CM

## 2013-08-29 DIAGNOSIS — N899 Noninflammatory disorder of vagina, unspecified: Secondary | ICD-10-CM

## 2013-08-29 DIAGNOSIS — B3731 Acute candidiasis of vulva and vagina: Secondary | ICD-10-CM

## 2013-08-29 DIAGNOSIS — N898 Other specified noninflammatory disorders of vagina: Secondary | ICD-10-CM | POA: Insufficient documentation

## 2013-08-29 MED ORDER — FLUCONAZOLE 150 MG PO TABS
150.0000 mg | ORAL_TABLET | Freq: Once | ORAL | Status: DC
Start: 2013-08-29 — End: 2013-11-19

## 2013-08-29 NOTE — Progress Notes (Signed)
Subjective:     Hannah Fleming is a 33 y.o. female here for a routine exam.  Current complaints: follow up. Pt states when she was in the office on 08-01-13. Pt states she was informed that she had BV and was given treatment. Pt states after taking the Metronidazole she has been experiencing vaginal irritation and itching.  Personal health questionnaire reviewed: yes.   Gynecologic History Patient's last menstrual period was 07/28/2013. Contraception: condoms Last Pap: 09/07/2012. Results were: normal   Obstetric History OB History  Gravida Para Term Preterm AB SAB TAB Ectopic Multiple Living  7 3 3  4 1 3   3     # Outcome Date GA Lbr Len/2nd Weight Sex Delivery Anes PTL Lv  7 TAB           6 TAB           5 TRM     M SVD     4 TRM     M SVD     3 TAB           2 TRM     F SVD     1 SAB              Comments: System Generated. Please review and update pregnancy details.       The following portions of the patient's history were reviewed and updated as appropriate: allergies, current medications, past family history, past medical history, past social history, past surgical history and problem list.  Review of Systems Pertinent items are noted in HPI.    Objective:    General appearance: alert   PE:      Vagina with copious whitish discharge.  Assessment:    Candida vulva vaginitis.   Plan:    Follow up in: several months. Diflucan Rx.

## 2013-08-30 LAB — WET PREP BY MOLECULAR PROBE
Candida species: NEGATIVE
Gardnerella vaginalis: POSITIVE — AB
Trichomonas vaginosis: NEGATIVE

## 2013-08-30 LAB — GC/CHLAMYDIA PROBE AMP
CT Probe RNA: NEGATIVE
GC Probe RNA: NEGATIVE

## 2013-09-04 ENCOUNTER — Other Ambulatory Visit: Payer: Self-pay | Admitting: *Deleted

## 2013-09-04 DIAGNOSIS — B9689 Other specified bacterial agents as the cause of diseases classified elsewhere: Secondary | ICD-10-CM

## 2013-09-04 DIAGNOSIS — N760 Acute vaginitis: Principal | ICD-10-CM

## 2013-09-04 MED ORDER — METRONIDAZOLE 500 MG PO TABS: 500.0000 mg | ORAL_TABLET | Freq: Two times a day (BID) | ORAL | Status: DC

## 2013-10-11 ENCOUNTER — Other Ambulatory Visit: Payer: Self-pay | Admitting: *Deleted

## 2013-10-11 DIAGNOSIS — B373 Candidiasis of vulva and vagina: Secondary | ICD-10-CM

## 2013-10-11 DIAGNOSIS — B3731 Acute candidiasis of vulva and vagina: Secondary | ICD-10-CM

## 2013-10-11 MED ORDER — TERCONAZOLE 0.4 % VA CREA: 1.0000 | TOPICAL_CREAM | Freq: Every day | VAGINAL | Status: DC

## 2013-11-19 ENCOUNTER — Other Ambulatory Visit (HOSPITAL_COMMUNITY)
Admission: RE | Admit: 2013-11-19 | Discharge: 2013-11-19 | Disposition: A | Discharge status: Home / Self Care | Payer: Medicaid Other | Source: Ambulatory Visit | Attending: Family Medicine | Admitting: Family Medicine

## 2013-11-19 ENCOUNTER — Emergency Department (HOSPITAL_COMMUNITY)
Admission: EM | Admit: 2013-11-19 | Discharge: 2013-11-19 | Disposition: A | Discharge status: Home / Self Care | Payer: Medicaid Other | Source: Home / Self Care | Attending: Family Medicine | Admitting: Family Medicine

## 2013-11-19 ENCOUNTER — Encounter (HOSPITAL_COMMUNITY): Payer: Self-pay | Admitting: Emergency Medicine

## 2013-11-19 ENCOUNTER — Emergency Department (HOSPITAL_COMMUNITY)
Admission: EM | Admit: 2013-11-19 | Discharge: 2013-11-19 | Disposition: A | Discharge status: Home / Self Care | Payer: Medicaid Other | Attending: Emergency Medicine | Admitting: Emergency Medicine

## 2013-11-19 ENCOUNTER — Emergency Department (HOSPITAL_COMMUNITY): Payer: Medicaid Other

## 2013-11-19 DIAGNOSIS — Z87891 Personal history of nicotine dependence: Secondary | ICD-10-CM | POA: Insufficient documentation

## 2013-11-19 DIAGNOSIS — Z79899 Other long term (current) drug therapy: Secondary | ICD-10-CM | POA: Insufficient documentation

## 2013-11-19 DIAGNOSIS — Z113 Encounter for screening for infections with a predominantly sexual mode of transmission: Secondary | ICD-10-CM | POA: Insufficient documentation

## 2013-11-19 DIAGNOSIS — R109 Unspecified abdominal pain: Secondary | ICD-10-CM

## 2013-11-19 DIAGNOSIS — R630 Anorexia: Secondary | ICD-10-CM | POA: Insufficient documentation

## 2013-11-19 DIAGNOSIS — Z91040 Latex allergy status: Secondary | ICD-10-CM | POA: Insufficient documentation

## 2013-11-19 DIAGNOSIS — N760 Acute vaginitis: Secondary | ICD-10-CM | POA: Insufficient documentation

## 2013-11-19 DIAGNOSIS — R1031 Right lower quadrant pain: Secondary | ICD-10-CM | POA: Insufficient documentation

## 2013-11-19 LAB — POCT URINALYSIS DIP (DEVICE)
Bilirubin Urine: NEGATIVE
Glucose, UA: NEGATIVE mg/dL
Ketones, ur: NEGATIVE mg/dL
LEUKOCYTES UA: NEGATIVE
Nitrite: NEGATIVE
PH: 7 (ref 5.0–8.0)
PROTEIN: NEGATIVE mg/dL
Specific Gravity, Urine: 1.02 (ref 1.005–1.030)
UROBILINOGEN UA: 0.2 mg/dL (ref 0.0–1.0)

## 2013-11-19 LAB — COMPREHENSIVE METABOLIC PANEL
ALT: 14 U/L (ref 0–35)
AST: 17 U/L (ref 0–37)
Albumin: 4.1 g/dL (ref 3.5–5.2)
Alkaline Phosphatase: 65 U/L (ref 39–117)
BUN: 15 mg/dL (ref 6–23)
CALCIUM: 9.7 mg/dL (ref 8.4–10.5)
CO2: 23 mEq/L (ref 19–32)
Chloride: 99 mEq/L (ref 96–112)
Creatinine, Ser: 0.73 mg/dL (ref 0.50–1.10)
GFR calc non Af Amer: >90 mL/min (ref >90)
GLUCOSE: 80 mg/dL (ref 70–99)
Potassium: 4.3 mEq/L (ref 3.7–5.3)
SODIUM: 138 meq/L (ref 137–147)
Total Bilirubin: 0.2 mg/dL — ABNORMAL LOW (ref 0.3–1.2)
Total Protein: 8 g/dL (ref 6.0–8.3)

## 2013-11-19 LAB — POCT PREGNANCY, URINE: Preg Test, Ur: NEGATIVE

## 2013-11-19 LAB — CBC
HCT: 40.4 % (ref 36.0–46.0)
HEMOGLOBIN: 13.9 g/dL (ref 12.0–15.0)
MCH: 31 pg (ref 26.0–34.0)
MCHC: 34.4 g/dL (ref 30.0–36.0)
MCV: 90 fL (ref 78.0–100.0)
Platelets: 276 10*3/uL (ref 150–400)
RBC: 4.49 MIL/uL (ref 3.87–5.11)
RDW: 13.3 % (ref 11.5–15.5)
WBC: 16 10*3/uL — ABNORMAL HIGH (ref 4.0–10.5)

## 2013-11-19 LAB — LIPASE, BLOOD: LIPASE: 22 U/L (ref 11–59)

## 2013-11-19 LAB — RPR: RPR: NONREACTIVE

## 2013-11-19 MED ORDER — KETOROLAC TROMETHAMINE 30 MG/ML IJ SOLN
15.0000 mg | Freq: Once | INTRAMUSCULAR | Status: AC
  Administered 2013-11-19: 15 mg via INTRAVENOUS
  Filled 2013-11-19: qty 1

## 2013-11-19 MED ORDER — IOHEXOL 300 MG/ML  SOLN
100.0000 mL | Freq: Once | INTRAMUSCULAR | Status: AC | PRN
  Administered 2013-11-19: 100 mL via INTRAVENOUS

## 2013-11-19 MED ORDER — IOHEXOL 300 MG/ML  SOLN: 25.0000 mL | Freq: Once | INTRAMUSCULAR | Status: DC | PRN

## 2013-11-19 MED ORDER — KETOROLAC TROMETHAMINE 60 MG/2ML IM SOLN
60.0000 mg | Freq: Once | INTRAMUSCULAR | Status: DC
  Filled 2013-11-19: qty 2

## 2013-11-19 MED ORDER — HYDROCODONE-ACETAMINOPHEN 5-325 MG PO TABS: 1.0000 | ORAL_TABLET | ORAL | Status: DC | PRN

## 2013-11-19 NOTE — ED Notes (Signed)
Security called to transport pt back to urgent care where her vehicle is.

## 2013-11-19 NOTE — Discharge Instructions (Signed)
Abdominal Pain, Adult °Many things can cause abdominal pain. Usually, abdominal pain is not caused by a disease and will improve without treatment. It can often be observed and treated at home. Your health care provider will do a physical exam and possibly order blood tests and X-rays to help determine the seriousness of your pain. However, in many cases, more time must pass before a clear cause of the pain can be found. Before that point, your health care provider may not know if you need more testing or further treatment. °HOME CARE INSTRUCTIONS  °Monitor your abdominal pain for any changes. The following actions may help to alleviate any discomfort you are experiencing: °· Only take over-the-counter or prescription medicines as directed by your health care provider. °· Do not take laxatives unless directed to do so by your health care provider. °· Try a clear liquid diet (broth, tea, or water) as directed by your health care provider. Slowly move to a bland diet as tolerated. °SEEK MEDICAL CARE IF: °· You have unexplained abdominal pain. °· You have abdominal pain associated with nausea or diarrhea. °· You have pain when you urinate or have a bowel movement. °· You experience abdominal pain that wakes you in the night. °· You have abdominal pain that is worsened or improved by eating food. °· You have abdominal pain that is worsened with eating fatty foods. °SEEK IMMEDIATE MEDICAL CARE IF:  °· Your pain does not go away within 2 hours. °· You have a fever. °· You keep throwing up (vomiting). °· Your pain is felt only in portions of the abdomen, such as the right side or the left lower portion of the abdomen. °· You pass bloody or black tarry stools. °MAKE SURE YOU: °· Understand these instructions.   °· Will watch your condition.   °· Will get help right away if you are not doing well or get worse.   °Document Released: 05/20/2005 Document Revised: 05/31/2013 Document Reviewed: 04/19/2013 °[employer]® Patient  Information ©2014 [employer]. ° °

## 2013-11-19 NOTE — ED Provider Notes (Signed)
CSN: [id number]     Arrival date & time 11/19/13  1627 History   First MD Initiated Contact with Patient 11/19/13 1709     Chief Complaint  Patient presents with  . Abdominal Pain     (Consider location/radiation/quality/duration/timing/severity/associated sxs/prior Treatment) Patient is a 33 y.o. female presenting with abdominal pain. The history is provided by the patient.  Abdominal Pain Pain location:  RLQ Pain quality: aching and fullness   Pain radiates to:  Does not radiate Pain severity:  Moderate Onset quality:  Gradual Duration:  2 days Timing:  Constant Progression:  Worsening Chronicity:  New Context: alcohol use and recent sexual activity   Context: not medication withdrawal, not previous surgeries, not recent travel, not sick contacts and not trauma   Relieved by:  Nothing Worsened by:  Palpation Ineffective treatments:  None tried Associated symptoms: anorexia   Associated symptoms: no chest pain, no constipation, no cough, no diarrhea, no dysuria, no fever, no hematuria, no nausea, no shortness of breath, no vaginal bleeding, no vaginal discharge and no vomiting   Risk factors: obesity   Risk factors: not elderly, not pregnant and no recent hospitalization     Past Medical History  Diagnosis Date  . No pertinent past medical history    Past Surgical History  Procedure Laterality Date  . Dilation and curettage of uterus     Family History  Problem Relation Age of Onset  . Other Neg Hx    History  Substance Use Topics  . Smoking status: Former Smoker    Types: Cigarettes    Quit date: 02/07/2009  . Smokeless tobacco: Never Used  . Alcohol Use: Yes     Comment: occasional   OB History   Grav Para Term Preterm Abortions TAB SAB Ect Mult Living   7 3 3  4 3 1   3      Review of Systems  Constitutional: Positive for appetite change. Negative for fever and activity change.  HENT: Negative for congestion and rhinorrhea.   Eyes: Negative for  discharge, redness and itching.  Respiratory: Negative for cough, shortness of breath and wheezing.   Cardiovascular: Negative for chest pain and palpitations.  Gastrointestinal: Positive for abdominal pain and anorexia. Negative for nausea, vomiting, diarrhea and constipation.  Genitourinary: Negative for dysuria, hematuria, vaginal bleeding and vaginal discharge.  Skin: Negative for rash and wound.  Neurological: Negative for syncope and weakness.      Allergies  Latex  Home Medications   Current Outpatient Rx  Name  Route  Sig  Dispense  Refill  . clonazePAM (KLONOPIN) 0.5 MG tablet   Oral   Take 1 tablet (0.5 mg total) by mouth 2 (two) times daily.   60 tablet   2   . ibuprofen (ADVIL,MOTRIN) 800 MG tablet   Oral   Take 1 tablet (800 mg total) by mouth every 8 (eight) hours as needed for pain.   30 tablet   5   . HYDROcodone-acetaminophen (NORCO/VICODIN) 5-325 MG per tablet   Oral   Take 1 tablet by mouth every 4 (four) hours as needed for moderate pain or severe pain.   20 tablet   0    BP 128/95  Pulse 63  Temp(Src) 98.1 F (36.7 C)  Resp 16  Ht 5' 4.5" (1.638 m)  Wt 176 lb (79.833 kg)  BMI 29.75 kg/m2  SpO2 100%  LMP 10/13/2013 Physical Exam  Constitutional: She is oriented to person, place, and time. She appears well-developed  and well-nourished. No distress.  NAD, well appearing  HENT:  Head: Normocephalic and atraumatic.  Mouth/Throat: Oropharynx is clear and moist.  Eyes: Conjunctivae and EOM are normal. Pupils are equal, round, and reactive to light. Right eye exhibits no discharge. Left eye exhibits no discharge. No scleral icterus.  Neck: Normal range of motion. Neck supple.  Cardiovascular: Normal rate, regular rhythm and intact distal pulses.  Exam reveals no gallop and no friction rub.   No murmur heard. Pulmonary/Chest: Effort normal and breath sounds normal. No respiratory distress. She has no wheezes. She has no rales.  Abdominal: Soft.  She exhibits no distension and no mass. There is tenderness (diffuse R sided ttp, no rigidity but considerable ttp. TTP over mcburneys point, neg murphy, + CVA TTP on R. No l eft sided ttp. + Heel tap, neg obturator, neg psoas).  Musculoskeletal: Normal range of motion.  Neurological: She is alert and oriented to person, place, and time. No cranial nerve deficit. She exhibits normal muscle tone. Coordination normal.  Skin: She is not diaphoretic.    ED Course  Procedures (including critical care time) Labs Review Labs Reviewed  COMPREHENSIVE METABOLIC PANEL - Abnormal; Notable for the following:    Total Bilirubin 0.2 (*)    All other components within normal limits  LIPASE, BLOOD   Imaging Review Ct Abdomen Pelvis W Contrast  11/19/2013   CLINICAL DATA:  Right flank pain.  EXAM: CT ABDOMEN AND PELVIS WITH CONTRAST  TECHNIQUE: Multidetector CT imaging of the abdomen and pelvis was performed using the standard protocol following bolus administration of intravenous contrast.  CONTRAST:  153mL OMNIPAQUE IOHEXOL 300 MG/ML  SOLN  COMPARISON:  None.  FINDINGS: Clear lung bases.  Heart is normal in size.  Normal liver, spleen, gallbladder pancreas. No bile duct dilation. Normal adrenal glands. Normal kidneys, ureters and bladder.  The uterus is retroverted. Uterus is unremarkable. No adnexal masses. Trace, physiologic, pelvic free fluid.  There are no pathologically enlarged lymph nodes. No abnormal fluid collections.  Bowel is unremarkable.  A normal appendix is visualized.  Normal bony structures.  IMPRESSION: Normal enhanced CT scan the abdomen pelvis.   Electronically Signed   By: Lajean Manes M.D.   On: 11/19/2013 20:01     EKG Interpretation None      MDM   MDM: 33 y.o. AAF w/ no PMHx sent from [facility] for r/o appy. Pt with 2 days of abdominal pain. Loss of appetite. No n/v/d, no dysuria, no f/c. No hx of similar, no hx of surgery. Seen at [facility], negative urine, negative pelvic. Labs drawn sent  here. Here with pain in R side, tender over mcburneys, non rigid. Labs ordered, normal CBC, CMP, Lipase. Urine from [facility] with no infection. Given Toradol for pain with good relief. CT scan obtained to r/o Appy. Negative CT. Pt feels better. Suspect pain from cyst. Will give rx for short course of Norco. Recommend f/u w/ PCP in 3 days if not better. Return precautions given including vomiting, fever, change or worsening sxs. Discharged. Care of case d/w my attending.  Final diagnoses:  Abdominal pain    Discharged  Sol Passer, MD 11/19/13 2239

## 2013-11-19 NOTE — ED Notes (Signed)
C/o std check and right flank pain States she has sharp pain States pain started Thursday after drinking heavy the night before No tx tried

## 2013-11-19 NOTE — ED Provider Notes (Signed)
CSN: [id number]     Arrival date & time 11/19/13  1320 History   First MD Initiated Contact with Patient 11/19/13 1541     Chief Complaint  Patient presents with  . Flank Pain  . SEXUALLY TRANSMITTED DISEASE   (Consider location/radiation/quality/duration/timing/severity/associated sxs/prior Treatment) HPI Comments: Developed fatigue and anorexia yesterday. Did not have an appetite for dinner. Began experiencing RLQ abdominal discomfort early this morning. Did not want breakfast. No fever, N/V/D/C. No dysuria, hematuria, vaginal bleeding. No previous abdominal surgery. LNBM: yesterday.  LNMP: [id number] Also reports hx of unprotected intercourse with new partner on 11-16-2013 and requests testing for STIs.   Patient is a 33 y.o. female presenting with abdominal pain. The history is provided by the patient.  Abdominal Pain Pain location:  RLQ Pain quality: aching and cramping   Pain radiates to:  R flank Pain severity:  Moderate Onset quality:  Gradual Associated symptoms: fatigue   Associated symptoms: no constipation, no diarrhea, no nausea and no vomiting     Past Medical History  Diagnosis Date  . No pertinent past medical history    Past Surgical History  Procedure Laterality Date  . Dilation and curettage of uterus     Family History  Problem Relation Age of Onset  . Other Neg Hx    History  Substance Use Topics  . Smoking status: Former Smoker    Types: Cigarettes    Quit date: 02/07/2009  . Smokeless tobacco: Never Used  . Alcohol Use: Yes     Comment: occasional   OB History   Grav Para Term Preterm Abortions TAB SAB Ect Mult Living   7 3 3  4 3 1   3      Review of Systems  Constitutional: Positive for appetite change and fatigue.  HENT: Negative.   Eyes: Negative.   Respiratory: Negative.   Cardiovascular: Negative.   Gastrointestinal: Positive for abdominal pain. Negative for nausea, vomiting, diarrhea, constipation, blood in stool, abdominal  distention and rectal pain.  Endocrine: Negative for polydipsia, polyphagia and polyuria.  Genitourinary: Negative.   Musculoskeletal: Positive for back pain.  Skin: Negative.   Neurological: Negative.     Allergies  Latex  Home Medications   Current Outpatient Rx  Name  Route  Sig  Dispense  Refill  . clonazePAM (KLONOPIN) 0.5 MG tablet   Oral   Take 1 tablet (0.5 mg total) by mouth 2 (two) times daily.   60 tablet   2   . HYDROcodone-acetaminophen (NORCO/VICODIN) 5-325 MG per tablet   Oral   Take 1 tablet by mouth every 4 (four) hours as needed for moderate pain or severe pain.   20 tablet   0   . ibuprofen (ADVIL,MOTRIN) 800 MG tablet   Oral   Take 1 tablet (800 mg total) by mouth every 8 (eight) hours as needed for pain.   30 tablet   5   . metroNIDAZOLE (FLAGYL) 500 MG tablet   Oral   Take 1 tablet (500 mg total) by mouth 2 (two) times daily.   14 tablet   0    BP 124/99  Pulse 82  Temp(Src) 98.5 F (36.9 C) (Oral)  Resp 18  SpO2 97%  LMP 10/13/2013 Physical Exam  Nursing note and vitals reviewed. Constitutional: She is oriented to person, place, and time. She appears well-developed and well-nourished. No distress.  HENT:  Head: Normocephalic and atraumatic.  Eyes: Conjunctivae are normal. No scleral icterus.  Cardiovascular: Normal rate, regular  rhythm and normal heart sounds.   Pulmonary/Chest: Effort normal and breath sounds normal. No respiratory distress. She has no wheezes.  Abdominal: Soft. She exhibits no mass. Bowel sounds are decreased. There is no hepatosplenomegaly. There is tenderness in the right lower quadrant. There is CVA tenderness and tenderness at McBurney's point. There is no rigidity, no rebound and no guarding.  +Right CVAT  Genitourinary: Vagina normal. Pelvic exam was performed with patient supine. There is no rash, tenderness or lesion on the right labia. There is no rash, tenderness or lesion on the left labia. Uterus is not  enlarged and not tender. Cervix exhibits no motion tenderness, no discharge and no friability. Right adnexum displays tenderness. Right adnexum displays no mass and no fullness. Left adnexum displays no mass, no tenderness and no fullness. No erythema, tenderness or bleeding around the vagina. No foreign body around the vagina. No vaginal discharge found.  Musculoskeletal: Normal range of motion.  Neurological: She is alert and oriented to person, place, and time.  Skin: Skin is warm and dry. No rash noted.  Psychiatric: She has a normal mood and affect. Her behavior is normal.    ED Course  Procedures (including critical care time) Labs Review Labs Reviewed  CBC - Abnormal; Notable for the following:    WBC 16.0 (*)    All other components within normal limits  POCT URINALYSIS DIP (DEVICE) - Abnormal; Notable for the following:    Hgb urine dipstick TRACE (*)    All other components within normal limits  CERVICOVAGINAL ANCILLARY ONLY - Abnormal; Notable for the following:    BD affirm **Gardnerella: POSITIVE** (*)    All other components within normal limits  RPR  HIV ANTIBODY (ROUTINE TESTING)  POCT PREGNANCY, URINE  CERVICOVAGINAL ANCILLARY ONLY   Imaging Review No results found.   MDM   1. Abdominal pain    RLQ abdominal pain with anorexia: UPT negative. UA normal. Pelvic exam unremarkable for PID. Abdominal exam concerning for acute appendicitis. Will transfer to [facility] for further evaluation.    [place], [place] 11/19/13 1623   Medical screening examination/treatment/procedure(s) were performed by a resident physician or non-physician practitioner and as the supervising physician I was immediately available for consultation/collaboration.  Lynne Leader, MD    Gregor Hams, MD 11/22/13 [phone]

## 2013-11-19 NOTE — ED Notes (Signed)
The pt has had rt abd pain since Friday worse today. No n or v.  [facility] exam urine and labs.  Unable to see the type labs done.  Sent down here for further treatment.  Feb 20th

## 2013-11-20 LAB — HIV ANTIBODY (ROUTINE TESTING W REFLEX): HIV: NONREACTIVE

## 2013-11-20 LAB — CERVICOVAGINAL ANCILLARY ONLY
CHLAMYDIA, DNA PROBE: NEGATIVE
Neisseria Gonorrhea: NEGATIVE
WET PREP (BD AFFIRM): NEGATIVE
Wet Prep (BD Affirm): NEGATIVE
Wet Prep (BD Affirm): POSITIVE — AB

## 2013-11-21 ENCOUNTER — Telehealth (HOSPITAL_COMMUNITY): Payer: Self-pay | Admitting: *Deleted

## 2013-11-21 MED ORDER — METRONIDAZOLE 500 MG PO TABS: 500.0000 mg | ORAL_TABLET | Freq: Two times a day (BID) | ORAL | Status: DC

## 2013-11-21 NOTE — ED Notes (Signed)
GC/Chlamydia neg., Affirm: Candida and Trich neg., Gardnerella pos., HIV/RPR non-reactive.  3/30 Message sent to Dot Been PA.  3/31  Truman Hayward e-prescribed Flagyl to pt.'s pharmacy.  I called pt. Pt. verified x 2 and given results.  Pt. Told she needs Flagyl for bacterial vaginosis and where to pick up her Rx.   Pt. instructed to no alcohol while taking this medication. Pt. voiced understanding. Roselyn Meier 11/21/2013

## 2013-11-26 NOTE — ED Provider Notes (Signed)
I saw and evaluated the patient, reviewed the resident's note and I agree with the findings and plan.   .Face to face Exam:  General:  Awake HEENT:  Atraumatic Resp:  Normal effort Abd:  Nondistended Neuro:No focal weakness   Dot Lanes, MD 11/26/13 2237

## 2013-12-05 ENCOUNTER — Emergency Department (HOSPITAL_COMMUNITY)
Admission: EM | Admit: 2013-12-05 | Discharge: 2013-12-05 | Disposition: A | Discharge status: Home / Self Care | Payer: Medicaid Other | Attending: Emergency Medicine | Admitting: Emergency Medicine

## 2013-12-05 ENCOUNTER — Encounter (HOSPITAL_COMMUNITY): Payer: Self-pay | Admitting: Emergency Medicine

## 2013-12-05 ENCOUNTER — Emergency Department (HOSPITAL_COMMUNITY): Payer: Medicaid Other

## 2013-12-05 DIAGNOSIS — Z79899 Other long term (current) drug therapy: Secondary | ICD-10-CM | POA: Insufficient documentation

## 2013-12-05 DIAGNOSIS — Z792 Long term (current) use of antibiotics: Secondary | ICD-10-CM | POA: Insufficient documentation

## 2013-12-05 DIAGNOSIS — K5900 Constipation, unspecified: Secondary | ICD-10-CM | POA: Insufficient documentation

## 2013-12-05 DIAGNOSIS — N898 Other specified noninflammatory disorders of vagina: Secondary | ICD-10-CM | POA: Insufficient documentation

## 2013-12-05 DIAGNOSIS — N939 Abnormal uterine and vaginal bleeding, unspecified: Secondary | ICD-10-CM

## 2013-12-05 DIAGNOSIS — Z91040 Latex allergy status: Secondary | ICD-10-CM | POA: Insufficient documentation

## 2013-12-05 DIAGNOSIS — Z87891 Personal history of nicotine dependence: Secondary | ICD-10-CM | POA: Insufficient documentation

## 2013-12-05 DIAGNOSIS — Z3202 Encounter for pregnancy test, result negative: Secondary | ICD-10-CM | POA: Insufficient documentation

## 2013-12-05 DIAGNOSIS — R102 Pelvic and perineal pain: Secondary | ICD-10-CM

## 2013-12-05 DIAGNOSIS — N949 Unspecified condition associated with female genital organs and menstrual cycle: Secondary | ICD-10-CM | POA: Insufficient documentation

## 2013-12-05 LAB — WET PREP, GENITAL
Clue Cells Wet Prep HPF POC: NONE SEEN
Trich, Wet Prep: NONE SEEN

## 2013-12-05 LAB — URINALYSIS, ROUTINE W REFLEX MICROSCOPIC
BILIRUBIN URINE: NEGATIVE
Glucose, UA: NEGATIVE mg/dL
Ketones, ur: NEGATIVE mg/dL
LEUKOCYTES UA: NEGATIVE
NITRITE: NEGATIVE
PH: 6.5 (ref 5.0–8.0)
Protein, ur: NEGATIVE mg/dL
Specific Gravity, Urine: 1.024 (ref 1.005–1.030)
UROBILINOGEN UA: 0.2 mg/dL (ref 0.0–1.0)

## 2013-12-05 LAB — CBC WITH DIFFERENTIAL/PLATELET
Basophils Absolute: 0 10*3/uL (ref 0.0–0.1)
Basophils Relative: 0 % (ref 0–1)
EOS ABS: 0.2 10*3/uL (ref 0.0–0.7)
EOS PCT: 2 % (ref 0–5)
HCT: 39.2 % (ref 36.0–46.0)
Hemoglobin: 13.3 g/dL (ref 12.0–15.0)
LYMPHS ABS: 2.6 10*3/uL (ref 0.7–4.0)
LYMPHS PCT: 28 % (ref 12–46)
MCH: 30.4 pg (ref 26.0–34.0)
MCHC: 33.9 g/dL (ref 30.0–36.0)
MCV: 89.5 fL (ref 78.0–100.0)
MONOS PCT: 7 % (ref 3–12)
Monocytes Absolute: 0.6 10*3/uL (ref 0.1–1.0)
Neutro Abs: 5.9 10*3/uL (ref 1.7–7.7)
Neutrophils Relative %: 63 % (ref 43–77)
Platelets: 253 10*3/uL (ref 150–400)
RBC: 4.38 MIL/uL (ref 3.87–5.11)
RDW: 13.2 % (ref 11.5–15.5)
WBC: 9.4 10*3/uL (ref 4.0–10.5)

## 2013-12-05 LAB — COMPREHENSIVE METABOLIC PANEL
ALT: 8 U/L (ref 0–35)
AST: 13 U/L (ref 0–37)
Albumin: 3.7 g/dL (ref 3.5–5.2)
Alkaline Phosphatase: 56 U/L (ref 39–117)
BUN: 12 mg/dL (ref 6–23)
CO2: 24 meq/L (ref 19–32)
Calcium: 9.4 mg/dL (ref 8.4–10.5)
Chloride: 103 mEq/L (ref 96–112)
Creatinine, Ser: 0.82 mg/dL (ref 0.50–1.10)
GLUCOSE: 84 mg/dL (ref 70–99)
Potassium: 4 mEq/L (ref 3.7–5.3)
Sodium: 140 mEq/L (ref 137–147)
TOTAL PROTEIN: 7.3 g/dL (ref 6.0–8.3)
Total Bilirubin: <0.2 mg/dL — ABNORMAL LOW (ref 0.3–1.2)

## 2013-12-05 LAB — URINE MICROSCOPIC-ADD ON

## 2013-12-05 LAB — LIPASE, BLOOD: Lipase: 30 U/L (ref 11–59)

## 2013-12-05 LAB — POC URINE PREG, ED: Preg Test, Ur: NEGATIVE

## 2013-12-05 MED ORDER — IBUPROFEN 600 MG PO TABS: 600.0000 mg | ORAL_TABLET | Freq: Four times a day (QID) | ORAL | Status: DC | PRN

## 2013-12-05 MED ORDER — ONDANSETRON HCL 4 MG/2ML IJ SOLN
4.0000 mg | Freq: Once | INTRAMUSCULAR | Status: AC
  Administered 2013-12-05: 4 mg via INTRAVENOUS
  Filled 2013-12-05: qty 2

## 2013-12-05 MED ORDER — MORPHINE SULFATE 4 MG/ML IJ SOLN
4.0000 mg | Freq: Once | INTRAMUSCULAR | Status: AC
  Administered 2013-12-05: 4 mg via INTRAVENOUS
  Filled 2013-12-05: qty 1

## 2013-12-05 MED ORDER — KETOROLAC TROMETHAMINE 30 MG/ML IJ SOLN
30.0000 mg | Freq: Once | INTRAMUSCULAR | Status: AC
  Administered 2013-12-05: 30 mg via INTRAVENOUS
  Filled 2013-12-05: qty 1

## 2013-12-05 NOTE — Discharge Instructions (Signed)
Please read and follow all provided instructions.  Your diagnoses today include:  1. Pelvic pain   2. Vaginal bleeding     Tests performed today include:  Blood counts and electrolytes  Blood tests to check liver and kidney function  Blood tests to check pancreas function  Urine test to look for infection and pregnancy (in women)  Ultrasound - shows normal uterus and ovaries  Vital signs. See below for your results today.    Medications prescribed:   Ibuprofen (Motrin, Advil) - anti-inflammatory pain medication  Do not exceed 600mg  ibuprofen every 6 hours, take with food  You have been prescribed an anti-inflammatory medication or NSAID. Take with food. Take smallest effective dose for the shortest duration needed for your pain. Stop taking if you experience stomach pain or vomiting.   Take any prescribed medications only as directed.  Home care instructions:   Follow any educational materials contained in this packet.  Follow-up instructions: Please follow-up with your primary care provider or gynecologist in the next 7 days for further evaluation of your symptoms. If you do not have a primary care doctor -- see below for referral information.   Return instructions:  SEEK IMMEDIATE MEDICAL ATTENTION IF:  The pain does not go away or becomes severe   A temperature above 101F develops   Repeated vomiting occurs (multiple episodes)   The pain becomes localized to portions of the abdomen. The right side could possibly be appendicitis. In an adult, the left lower portion of the abdomen could be colitis or diverticulitis.   Blood is being passed in stools or vomit (bright red or black tarry stools)   You develop chest pain, difficulty breathing, dizziness or fainting, or become confused, poorly responsive, or inconsolable (young children)  If you have any other emergent concerns regarding your health  Additional Information: Abdominal (belly) pain can be caused by  many things. Your caregiver performed an examination and possibly ordered blood/urine tests and imaging (CT scan, x-rays, ultrasound). Many cases can be observed and treated at home after initial evaluation in the emergency department. Even though you are being discharged home, abdominal pain can be unpredictable. Therefore, you need a repeated exam if your pain does not resolve, returns, or worsens. Most patients with abdominal pain don't have to be admitted to the hospital or have surgery, but serious problems like appendicitis and gallbladder attacks can start out as nonspecific pain. Many abdominal conditions cannot be diagnosed in one visit, so follow-up evaluations are very important.  Your vital signs today were: BP 99/55   Pulse 69   Temp(Src) 97.5 F (36.4 C) (Oral)   Resp 16   Ht 5\' 5"  (1.651 m)   Wt 179 lb 2 oz (81.251 kg)   BMI 29.81 kg/m2   SpO2 99%   LMP 10/13/2013 If your blood pressure (bp) was elevated above 135/85 this visit, please have this repeated by your doctor within one month. --------------

## 2013-12-05 NOTE — ED Notes (Signed)
Back from [place].

## 2013-12-05 NOTE — ED Notes (Signed)
Pt reports abd pain with bleeding starting and stopping on Saturday. Pt states that her menses came on Sunday" pt reports an increase in pain and states 'I have been eating ibuprofen"  Pt states that she was seen at this facility for similar s/s 2 weeks ago and diagnosed with ovarian cyst. Condition is chronic in nature. Condition is made better by codone. Condition is made worse by "touching me"

## 2013-12-05 NOTE — ED Provider Notes (Deleted)
Medical screening examination/treatment/procedure(s) were performed by non-physician practitioner and as supervising physician I was immediately available for consultation/collaboration.  Richarda Blade, MD 12/05/13 [phone]

## 2013-12-05 NOTE — ED Provider Notes (Signed)
CSN: [id number]     Arrival date & time 12/05/13  3382 History   First MD Initiated Contact with Patient 12/05/13 1145     Chief Complaint  Patient presents with  . Abdominal Pain     (Consider location/radiation/quality/duration/timing/severity/associated sxs/prior Treatment) HPI Comments: Patient presents with complaint of abdominal pain and vaginal bleeding. Patient was seen in emergency department approximately 2 weeks ago and had a CT scan to rule out appendicitis. The CT scan was negative. She was told that she may have an ovarian cyst. Since that time she has continued to have pain. Pain became worse yesterday and was in her left abdomen. Patient also complains of pain in the top of her right shoulder. Patient also has typical crampy back pain which she associates with her menstrual period. Patient has irregular periods at baseline. She had a normal pelvic ultrasound in 07/2013 due to amenorrhea. Patient has had vaginal bleeding which began 2 days ago and is now slowing. Patient states that her cycles are typically always 5 days in duration, severe the first 3 days and then gradually resolving. She is not due to start her period for another week. No history of abdominal surgeries. She's not sexually active. The onset of this condition was acute. The course is constant. Aggravating factors: none. Alleviating factors: none.    The history is provided by the patient and medical records.    Past Medical History  Diagnosis Date  . No pertinent past medical history    Past Surgical History  Procedure Laterality Date  . Dilation and curettage of uterus     Family History  Problem Relation Age of Onset  . Other Neg Hx    History  Substance Use Topics  . Smoking status: Former Smoker    Types: Cigarettes    Quit date: 02/07/2009  . Smokeless tobacco: Never Used  . Alcohol Use: Yes     Comment: occasional   OB History   Grav Para Term Preterm Abortions TAB SAB Ect Mult Living   7  3 3  4 3 1   3      Review of Systems  Constitutional: Negative for fever.  HENT: Negative for rhinorrhea and sore throat.   Eyes: Negative for redness.  Respiratory: Negative for cough.   Cardiovascular: Negative for chest pain.  Gastrointestinal: Positive for abdominal pain and constipation (last BM 4 days ago). Negative for nausea, vomiting and diarrhea.  Genitourinary: Positive for vaginal bleeding. Negative for dysuria, hematuria, vaginal discharge and pelvic pain.  Musculoskeletal: Negative for myalgias.  Skin: Negative for rash.  Neurological: Negative for headaches.      Allergies  Latex  Home Medications   Prior to Admission medications   Medication Sig Start Date End Date Taking? Authorizing Provider  clonazePAM (KLONOPIN) 0.5 MG tablet Take 1 tablet (0.5 mg total) by mouth 2 (two) times daily. 02/07/13 02/07/14  Shelly Bombard, MD  HYDROcodone-acetaminophen (NORCO/VICODIN) 5-325 MG per tablet Take 1 tablet by mouth every 4 (four) hours as needed for moderate pain or severe pain. 11/19/13   Sol Passer, MD  ibuprofen (ADVIL,MOTRIN) 800 MG tablet Take 1 tablet (800 mg total) by mouth every 8 (eight) hours as needed for pain. 02/07/13   Shelly Bombard, MD  metroNIDAZOLE (FLAGYL) 500 MG tablet Take 1 tablet (500 mg total) by mouth 2 (two) times daily. 11/21/13   Annett Gula Presson, PA   BP 119/77  Pulse 80  Temp(Src) 97.5 F (36.4 C) (Oral)  Resp  18  Ht 5\' 5"  (1.651 m)  Wt 179 lb 2 oz (81.251 kg)  BMI 29.81 kg/m2  SpO2 99%  LMP 10/13/2013  Physical Exam  Nursing note and vitals reviewed. Constitutional: She appears well-developed and well-nourished.  HENT:  Head: Normocephalic and atraumatic.  Eyes: Conjunctivae are normal. Right eye exhibits no discharge. Left eye exhibits no discharge.  Neck: Normal range of motion. Neck supple.  Cardiovascular: Normal rate, regular rhythm and normal heart sounds.   Pulmonary/Chest: Effort normal and breath sounds normal.   Abdominal: Soft. Bowel sounds are normal. She exhibits no distension. There is tenderness. There is no rebound and no guarding.  Generalized, worse lower bilaterally  Genitourinary: There is no lesion on the right labia. There is no lesion on the left labia. Uterus is not tender. Cervix exhibits no motion tenderness, no discharge and no friability. Right adnexum displays no mass, no tenderness and no fullness. Left adnexum displays tenderness. Left adnexum displays no mass and no fullness. There is bleeding around the vagina. No tenderness around the vagina. No vaginal discharge found.  Neurological: She is alert.  Skin: Skin is warm and dry.  Psychiatric: She has a normal mood and affect.    ED Course  Procedures (including critical care time)  Labs Review Labs Reviewed  WET PREP, GENITAL - Abnormal; Notable for the following:    Yeast Wet Prep HPF POC FEW (*)    WBC, Wet Prep HPF POC FEW (*)    All other components within normal limits  COMPREHENSIVE METABOLIC PANEL - Abnormal; Notable for the following:    Total Bilirubin <0.2 (*)    All other components within normal limits  URINALYSIS, ROUTINE W REFLEX MICROSCOPIC - Abnormal; Notable for the following:    APPearance HAZY (*)    Hgb urine dipstick LARGE (*)    All other components within normal limits  URINE MICROSCOPIC-ADD ON - Abnormal; Notable for the following:    Squamous Epithelial / LPF MANY (*)    Bacteria, UA FEW (*)    All other components within normal limits  GC/CHLAMYDIA PROBE AMP  CBC WITH DIFFERENTIAL  LIPASE, BLOOD  POC URINE PREG, ED   Imaging Review US Transvaginal Non-ob  12/05/2013   CLINICAL DATA:  Left adnexal pain, evaluate for ovarian torsion  EXAM: TRANSABDOMINAL AND TRANSVAGINAL ULTRASOUND OF PELVIS  DOPPLER ULTRASOUND OF OVARIES  TECHNIQUE: Both transabdominal and transvaginal ultrasound examinations of the pelvis were performed. Transabdominal technique was performed for global imaging of the  pelvis including uterus, ovaries, adnexal regions, and pelvic cul-de-sac.  It was necessary to proceed with endovaginal exam following the transabdominal exam to visualize the uterus, endometrium, ovaries, and adnexal regions. Color and duplex Doppler ultrasound was utilized to evaluate blood flow to the ovaries.  COMPARISON:  None.  FINDINGS: Uterus  Measurements: 8 x 5.1 x 7.7 cm.  Retroverted otherwise no fibroids or other mass visualized.  Endometrium  Thickness: 1.8 mm.  No focal abnormality visualized.  Right ovary  Measurements: 2.3 x 1.9 x 3.5 cm. Normal appearance/no adnexal mass.  Left ovary  Measurements: 4.1 x 2.8 x 2.5 cm. Normal appearance/no adnexal mass.  Pulsed Doppler evaluation of both ovaries demonstrates normal low-resistance arterial and venous waveforms.  Other findings  No free fluid.  IMPRESSION: Negative pelvic ultrasound.   Electronically Signed   By: Margaree Mackintosh M.D.   On: 12/05/2013 15:01   US Pelvis Complete  12/05/2013   CLINICAL DATA:  Left adnexal pain, evaluate for ovarian  torsion  EXAM: TRANSABDOMINAL AND TRANSVAGINAL ULTRASOUND OF PELVIS  DOPPLER ULTRASOUND OF OVARIES  TECHNIQUE: Both transabdominal and transvaginal ultrasound examinations of the pelvis were performed. Transabdominal technique was performed for global imaging of the pelvis including uterus, ovaries, adnexal regions, and pelvic cul-de-sac.  It was necessary to proceed with endovaginal exam following the transabdominal exam to visualize the uterus, endometrium, ovaries, and adnexal regions. Color and duplex Doppler ultrasound was utilized to evaluate blood flow to the ovaries.  COMPARISON:  None.  FINDINGS: Uterus  Measurements: 8 x 5.1 x 7.7 cm.  Retroverted otherwise no fibroids or other mass visualized.  Endometrium  Thickness: 1.8 mm.  No focal abnormality visualized.  Right ovary  Measurements: 2.3 x 1.9 x 3.5 cm. Normal appearance/no adnexal mass.  Left ovary  Measurements: 4.1 x 2.8 x 2.5 cm. Normal  appearance/no adnexal mass.  Pulsed Doppler evaluation of both ovaries demonstrates normal low-resistance arterial and venous waveforms.  Other findings  No free fluid.  IMPRESSION: Negative pelvic ultrasound.   Electronically Signed   By: Margaree Mackintosh M.D.   On: 12/05/2013 15:01   [place] Art/ven Flow Abd Pelv Doppler  12/05/2013   CLINICAL DATA:  Left adnexal pain, evaluate for ovarian torsion  EXAM: TRANSABDOMINAL AND TRANSVAGINAL ULTRASOUND OF PELVIS  DOPPLER ULTRASOUND OF OVARIES  TECHNIQUE: Both transabdominal and transvaginal ultrasound examinations of the pelvis were performed. Transabdominal technique was performed for global imaging of the pelvis including uterus, ovaries, adnexal regions, and pelvic cul-de-sac.  It was necessary to proceed with endovaginal exam following the transabdominal exam to visualize the uterus, endometrium, ovaries, and adnexal regions. Color and duplex Doppler ultrasound was utilized to evaluate blood flow to the ovaries.  COMPARISON:  None.  FINDINGS: Uterus  Measurements: 8 x 5.1 x 7.7 cm.  Retroverted otherwise no fibroids or other mass visualized.  Endometrium  Thickness: 1.8 mm.  No focal abnormality visualized.  Right ovary  Measurements: 2.3 x 1.9 x 3.5 cm. Normal appearance/no adnexal mass.  Left ovary  Measurements: 4.1 x 2.8 x 2.5 cm. Normal appearance/no adnexal mass.  Pulsed Doppler evaluation of both ovaries demonstrates normal low-resistance arterial and venous waveforms.  Other findings  No free fluid.  IMPRESSION: Negative pelvic ultrasound.   Electronically Signed   By: Margaree Mackintosh M.D.   On: 12/05/2013 15:01     EKG Interpretation None      12:21 PM Patient seen and examined. Work-up initiated. Medications ordered.   Vital signs reviewed and are as follows: Filed Vitals:   12/05/13 1011  BP: 119/77  Pulse: 80  Temp: 97.5 F (36.4 C)  Resp: 18   1:40 PM pelvic exam performed with nurse tech chaperone. Ultrasound ordered. Pain is controlled.    3:42 PM patient informed of all results. She will followup with her gynecologist and continue ibuprofen and home pain medications.  The patient was urged to return to the Emergency Department immediately with worsening of current symptoms, worsening abdominal pain, persistent vomiting, blood noted in stools, fever, or any other concerns. The patient verbalized understanding.   MDM   Final diagnoses:  Pelvic pain  Vaginal bleeding   Patient with pelvic pain, painful periods x 2 weeks. Previously had negative CT scan. Today had negative ultrasound including negative for torsion. Workup has been negative for STD. She's not pregnant. Labs are reassuring. Doubt ureteral stone, diverticulitis, appendicitis. Vitals WNL. Uncertain etiology of pain, ? Endometriosis. Feel patient is safe for discharge home with gynecologist followup.  No dangerous or  life-threatening conditions suspected or identified by history, physical exam, and by work-up. No indications for hospitalization identified.      Carlisle Cater, PA-C 12/05/13 1544

## 2013-12-05 NOTE — ED Provider Notes (Signed)
  Face-to-face evaluation   History: Pain and bloating for 2 weeks. Hx of painful menses. No prior DX of endometriosis.  Physical exam: Abdomen - tender left upper and lower.   Medical screening examination/treatment/procedure(s) were conducted as a shared visit with non-physician practitioner(s) and myself.  I personally evaluated the patient during the encounter  Richarda Blade, MD 12/05/13 [phone]

## 2013-12-05 NOTE — ED Notes (Signed)
abd pain lef tside   x 2 weeks was seen and dx w/  Was given meds  But it makes  Her sleepy she is only taking motrin started taking meds rx on Sunday  So she has  Not been taking her pain as she should, period started  On sat spotted  States cannot be preg

## 2013-12-06 LAB — GC/CHLAMYDIA PROBE AMP
CT PROBE, AMP APTIMA: NEGATIVE
GC Probe RNA: NEGATIVE

## 2013-12-11 ENCOUNTER — Ambulatory Visit: Payer: Medicaid Other | Admitting: Obstetrics

## 2013-12-19 ENCOUNTER — Ambulatory Visit: Payer: Medicaid Other | Admitting: Obstetrics

## 2013-12-28 ENCOUNTER — Ambulatory Visit (INDEPENDENT_AMBULATORY_CARE_PROVIDER_SITE_OTHER): Payer: Medicaid Other | Admitting: Obstetrics

## 2013-12-28 ENCOUNTER — Encounter: Payer: Self-pay | Admitting: Obstetrics

## 2013-12-28 VITALS — BP 122/83 | HR 80 | Temp 97.8°F | Wt 178.0 lb

## 2013-12-28 DIAGNOSIS — N946 Dysmenorrhea, unspecified: Secondary | ICD-10-CM

## 2013-12-28 DIAGNOSIS — N939 Abnormal uterine and vaginal bleeding, unspecified: Principal | ICD-10-CM

## 2013-12-28 DIAGNOSIS — N926 Irregular menstruation, unspecified: Secondary | ICD-10-CM

## 2013-12-28 DIAGNOSIS — N920 Excessive and frequent menstruation with regular cycle: Secondary | ICD-10-CM | POA: Insufficient documentation

## 2013-12-28 LAB — POCT URINE PREGNANCY: Preg Test, Ur: NEGATIVE

## 2013-12-28 MED ORDER — OXYCODONE HCL 10 MG PO TABS: 10.0000 mg | ORAL_TABLET | Freq: Four times a day (QID) | ORAL | Status: DC | PRN

## 2013-12-28 NOTE — Progress Notes (Signed)
Patient ID: Hannah Fleming, female   DOB: April 17, 1981, 33 y.o.   MRN: [id number]  Chief Complaint  Patient presents with  . Follow-up    pelvic pain    HPI Hannah Fleming is a 33 y.o. female.  Heavy and painful periods.  Would like medical therapy with contraceptive benefit too.  HPI  Past Medical History  Diagnosis Date  . No pertinent past medical history     Past Surgical History  Procedure Laterality Date  . Dilation and curettage of uterus      Family History  Problem Relation Age of Onset  . Other Neg Hx     Social History History  Substance Use Topics  . Smoking status: Former Smoker    Types: Cigarettes    Quit date: 02/07/2009  . Smokeless tobacco: Never Used  . Alcohol Use: Yes     Comment: occasional    Allergies  Allergen Reactions  . Latex Itching and Rash    Current Outpatient Prescriptions  Medication Sig Dispense Refill  . clonazePAM (KLONOPIN) 0.5 MG tablet Take 1 tablet (0.5 mg total) by mouth 2 (two) times daily.  60 tablet  2  . HYDROcodone-acetaminophen (NORCO/VICODIN) 5-325 MG per tablet Take 1 tablet by mouth every 4 (four) hours as needed for moderate pain or severe pain.  20 tablet  0  . ibuprofen (ADVIL,MOTRIN) 600 MG tablet Take 1 tablet (600 mg total) by mouth every 6 (six) hours as needed.  30 tablet  0  . metroNIDAZOLE (FLAGYL) 500 MG tablet Take 1 tablet (500 mg total) by mouth 2 (two) times daily.  14 tablet  0  . Oxycodone HCl 10 MG TABS Take 1 tablet (10 mg total) by mouth every 6 (six) hours as needed.  40 tablet  0   No current facility-administered medications for this visit.    Review of Systems Review of Systems Constitutional: negative for fatigue and weight loss Respiratory: negative for cough and wheezing Cardiovascular: negative for chest pain, fatigue and palpitations Gastrointestinal: negative for abdominal pain and change in bowel habits Genitourinary:negative Integument/breast: negative for nipple  discharge Musculoskeletal:negative for myalgias Neurological: negative for gait problems and tremors Behavioral/Psych: negative for abusive relationship, depression Endocrine: negative for temperature intolerance     Blood pressure 122/83, pulse 80, temperature 97.8 F (36.6 C), weight 178 lb (80.74 kg), last menstrual period 11/29/2013.  Physical Exam Physical Exam General:   alert  Skin:   no rash or abnormalities  Lungs:   clear to auscultation bilaterally  Heart:   regular rate and rhythm, S1, S2 normal, no murmur, click, rub or gallop  Breasts:   normal without suspicious masses, skin or nipple changes or axillary nodes  Abdomen:  normal findings: no organomegaly, soft, non-tender and no hernia  Pelvis:  External genitalia: normal general appearance Urinary system: urethral meatus normal and bladder without fullness, nontender Vaginal: normal without tenderness, induration or masses Cervix: normal appearance Adnexa: normal bimanual exam Uterus: anteverted and non-tender, normal size    100% of 10 min visit spent on counseling and coordination of care.   Data Reviewed Ultrasound is WNL's.  Assessment    AUB.  Heavy and painful periods.     Plan  Mirena IUD. Ibuprofen / Oxycodone Rx. F/U in 2 weeks for IUD insertion.    Orders Placed This Encounter  Procedures  . POCT urine pregnancy   Meds ordered this encounter  Medications  . DISCONTD: Oxycodone HCl 10 MG TABS  Sig: Take 1 tablet (10 mg total) by mouth every 6 (six) hours as needed.    Dispense:  40 tablet    Refill:  0  . Oxycodone HCl 10 MG TABS    Sig: Take 1 tablet (10 mg total) by mouth every 6 (six) hours as needed.    Dispense:  40 tablet    Refill:  0      Shelly Bombard 12/28/2013, 3:28 PM

## 2014-01-04 ENCOUNTER — Other Ambulatory Visit: Payer: Self-pay | Admitting: *Deleted

## 2014-01-04 DIAGNOSIS — B9689 Other specified bacterial agents as the cause of diseases classified elsewhere: Secondary | ICD-10-CM

## 2014-01-04 DIAGNOSIS — N760 Acute vaginitis: Principal | ICD-10-CM

## 2014-01-04 MED ORDER — METRONIDAZOLE 500 MG PO TABS: 500.0000 mg | ORAL_TABLET | Freq: Two times a day (BID) | ORAL | Status: DC

## 2014-03-03 ENCOUNTER — Encounter (HOSPITAL_COMMUNITY): Payer: Self-pay | Admitting: Emergency Medicine

## 2014-03-03 ENCOUNTER — Emergency Department (HOSPITAL_COMMUNITY)
Admission: EM | Admit: 2014-03-03 | Discharge: 2014-03-03 | Disposition: A | Discharge status: Home / Self Care | Payer: Medicaid Other | Attending: Emergency Medicine | Admitting: Emergency Medicine

## 2014-03-03 DIAGNOSIS — H0019 Chalazion unspecified eye, unspecified eyelid: Secondary | ICD-10-CM | POA: Insufficient documentation

## 2014-03-03 DIAGNOSIS — Z79899 Other long term (current) drug therapy: Secondary | ICD-10-CM | POA: Diagnosis not present

## 2014-03-03 DIAGNOSIS — H01009 Unspecified blepharitis unspecified eye, unspecified eyelid: Secondary | ICD-10-CM | POA: Insufficient documentation

## 2014-03-03 DIAGNOSIS — Z87891 Personal history of nicotine dependence: Secondary | ICD-10-CM | POA: Insufficient documentation

## 2014-03-03 DIAGNOSIS — Z91040 Latex allergy status: Secondary | ICD-10-CM | POA: Diagnosis not present

## 2014-03-03 DIAGNOSIS — H5710 Ocular pain, unspecified eye: Secondary | ICD-10-CM | POA: Diagnosis present

## 2014-03-03 DIAGNOSIS — H0015 Chalazion left lower eyelid: Secondary | ICD-10-CM

## 2014-03-03 DIAGNOSIS — H01006 Unspecified blepharitis left eye, unspecified eyelid: Secondary | ICD-10-CM

## 2014-03-03 MED ORDER — ERYTHROMYCIN 5 MG/GM OP OINT: TOPICAL_OINTMENT | OPHTHALMIC | Status: DC

## 2014-03-03 MED ORDER — IBUPROFEN 400 MG PO TABS
400.0000 mg | ORAL_TABLET | Freq: Once | ORAL | Status: AC
Start: 2014-03-03 — End: 2014-03-03
  Administered 2014-03-03: 400 mg via ORAL
  Filled 2014-03-03: qty 1

## 2014-03-03 NOTE — ED Provider Notes (Signed)
CSN: [id number]     Arrival date & time 03/03/14  0756 History   First MD Initiated Contact with Patient 03/03/14 [phone]     Chief Complaint  Patient presents with  . Eye Pain     (Consider location/radiation/quality/duration/timing/severity/associated sxs/prior Treatment) The history is provided by the patient. No language interpreter was used.  Hannah Fleming is a 33 year old female with known significant past medical history presenting to the ED with left eye pain that started on Thursday. Patient reported that the discomfort is localized to her lower eyelid described as a soreness. Stated that on Thursday the eye discomfort started as an itching sensation. Stated that as the days progressed she started to notice mild swelling to the medial aspect of her lower eyelid and a soreness upon palpation. Stated that when she woke up this morning she had to pry her eye open secondary to drainage and debris on her eyelashes. Patient reported that she recently had a false eyelashes individually placed last weekend - reported that this was for the first time. Denied fever, chills, nasal congestion, cough, blurred vision, sudden loss of vision, photosensitivity, pain with eye motion, actual eye pain. PCP none  Past Medical History  Diagnosis Date  . No pertinent past medical history    Past Surgical History  Procedure Laterality Date  . Dilation and curettage of uterus     Family History  Problem Relation Age of Onset  . Other Neg Hx    History  Substance Use Topics  . Smoking status: Former Smoker    Types: Cigarettes    Quit date: 02/07/2009  . Smokeless tobacco: Never Used  . Alcohol Use: Yes     Comment: occasional   OB History   Grav Para Term Preterm Abortions TAB SAB Ect Mult Living   7 3 3  4 3 1   3      Review of Systems  Constitutional: Negative for fever and chills.  HENT: Negative for congestion.   Eyes: Positive for discharge and itching. Negative for photophobia,  pain, redness and visual disturbance.       Lower eyelid swelling and discomfort  Respiratory: Negative for cough.   Musculoskeletal: Negative for neck pain.      Allergies  Latex  Home Medications   Prior to Admission medications   Medication Sig Start Date End Date Taking? Authorizing Provider  clonazePAM (KLONOPIN) 0.5 MG tablet Take 0.5 mg by mouth 2 (two) times daily as needed for anxiety.   Yes Historical Provider, MD  ibuprofen (ADVIL,MOTRIN) 600 MG tablet Take 1 tablet (600 mg total) by mouth every 6 (six) hours as needed. 12/05/13  Yes Carlisle Cater, PA-C  Oxycodone HCl 10 MG TABS Take 1 tablet (10 mg total) by mouth every 6 (six) hours as needed. 12/28/13  Yes Shelly Bombard, MD  clonazePAM (KLONOPIN) 0.5 MG tablet Take 1 tablet (0.5 mg total) by mouth 2 (two) times daily. 02/07/13 02/07/14  Shelly Bombard, MD  erythromycin ophthalmic ointment Place a 1/2 inch ribbon of ointment into the lower eyelid 3 times per day for 5 days. 03/03/14   Johnattan Strassman, PA-C   BP 125/76  Pulse 73  Temp(Src) 97.6 F (36.4 C) (Oral)  Resp 18  Ht 5\' 5"  (1.651 m)  Wt 176 lb (79.833 kg)  BMI 29.29 kg/m2  SpO2 99%  LMP 02/01/2014 Physical Exam  Nursing note and vitals reviewed. Constitutional: She is oriented to person, place, and time. She appears well-developed and well-nourished.  No distress.  HENT:  Head: Normocephalic and atraumatic.  Mouth/Throat: Oropharynx is clear and moist. No oropharyngeal exudate.  Eyes: Conjunctivae and EOM are normal. Pupils are equal, round, and reactive to light. Right eye exhibits no chemosis, no discharge and no exudate. No foreign body present in the right eye. Left eye exhibits no chemosis, no discharge, no exudate and no hordeolum. No foreign body present in the left eye. Right conjunctiva is not injected. Right conjunctiva has no hemorrhage. Left conjunctiva is not injected. Left conjunctiva has no hemorrhage. Right eye exhibits normal extraocular motion  and no nystagmus. Left eye exhibits normal extraocular motion and no nystagmus. Right pupil is round and reactive. Left pupil is round and reactive.    Debris identified in the upper and lower eyelashes of the left eye. Mild swelling active drainage, bleeding, tearing identified to the medial aspect of the lower eyelid.  Neck: Normal range of motion. Neck supple. No tracheal deviation present.  Cardiovascular: Normal rate, regular rhythm and normal heart sounds.  Exam reveals no friction rub.   No murmur heard. Pulses:      Radial pulses are 2+ on the right side, and 2+ on the left side.  Pulmonary/Chest: Effort normal and breath sounds normal. No respiratory distress. She has no wheezes. She has no rales.  Musculoskeletal: Normal range of motion.  Full ROM to upper and lower extremities without difficulty noted, negative ataxia noted.  Lymphadenopathy:    She has no cervical adenopathy.  Neurological: She is alert and oriented to person, place, and time. No cranial nerve deficit. She exhibits normal muscle tone. Coordination normal.  Cranial nerves III-XII grossly intact Negative facial drooping Negative slurred speech Negative aphasia  Skin: Skin is warm and dry. No rash noted. She is not diaphoretic. No erythema.  Psychiatric: She has a normal mood and affect. Her behavior is normal. Thought content normal.    ED Course  Procedures (including critical care time) Labs Review Labs Reviewed - No data to display  Imaging Review No results found.   EKG Interpretation None      MDM   Final diagnoses:  Blepharitis, left  Chalazion of left lower eyelid    Medications  ibuprofen (ADVIL,MOTRIN) tablet 400 mg (400 mg Oral Given 03/03/14 0826)   Filed Vitals:   03/03/14 0803  BP: 125/76  Pulse: 73  Temp: 97.6 F (36.4 C)  TempSrc: Oral  Resp: 18  Height: 5\' 5"  (1.651 m)  Weight: 176 lb (79.833 kg)  SpO2: 99%   Patient presenting to the ED with left eye discomfort that  started on Thursday. Patient recently just had false eyelashes placed over the weekend.  Negative injection identified to the sclera and conjunctiva of the left eye. Negative active drainage or bleeding noted. Swelling to the medial aspect of the lower eyelid noted with negative active drainage or bleeding-discomfort, soreness upon palpation. Debris identified to the eyelashes upper and lower. Negative findings of seborrheic dermatitis identified to the scalp. Negative pain with motion to the eye. Doubt preseptal cellulitis. Doubt posterior to cellulitis. Doubt corneal abrasion. Doubt acute angle glaucoma. Suspicion to be possible blepharitis and secondary infection from glue of the false eyelashes. Possible beginnings of stye. Patient stable, afebrile. Discharged patient. Discussed with patient to remove eyelashes. Recommended baby oil for the debris. Discharge patient with antibiotic ointment for coverage. [facility] and [facility]mologist. Discussed with patient to apply cool compressions. Discussed with patient to avoid any contact to the eye.  Discussed with patient to closely monitor symptoms and if symptoms are to worsen or change to report back to the ED - strict return instructions given.  Patient agreed to plan of care, understood, all questions answered.   Jamse Mead, PA-C 03/03/14 1846

## 2014-03-03 NOTE — ED Notes (Signed)
VISUAL ACUITY RESULTED:  LEFT EYE: 20/50 RIGHT EYE: 20/40 BOTH EYES: 20/40  PT DOES NOT WEAR CONTACTS OR GLASSES

## 2014-03-03 NOTE — Discharge Instructions (Signed)
Please call your doctor for a followup appointment within 24-48 hours. When you talk to your doctor please let them know that you were seen in the emergency department and have them acquire all of your records so that they can discuss the findings with you and formulate a treatment plan to fully care for your new and ongoing problems. Please call and set up an appointment with ophthalmologist Please remove the eyelashes as soon as possible Please gently applied baby oil to help soothe the on a Please apply cool compressions Please wear protective eyewear Please try to avoid contact in the eye and itching the eye Please use medications if the eye discomfort worsens or swelling gets worse Please continue to monitor symptoms closely and if symptoms are to worsen or change (fever [place] [address], redness to the eye, tearing, drainage, discharge, swelling to the upper and lower eyelids, changes to skin to the eye, pain with motion to the eye, blurred vision, sudden loss of vision, trauma to the eye) please report back to the ED immediately   Blepharitis Blepharitis is redness, soreness, and swelling (inflammation) of one or both eyelids. It may be caused by an allergic reaction or a bacterial infection. Blepharitis may also be associated with reddened, scaly skin (seborrhea) of the scalp and eyebrows. While you sleep, eye discharge may cause your eyelashes to stick together. Your eyelids may itch, burn, swell, and may lose their lashes. These will grow back. Your eyes may become sensitive. Blepharitis may recur and need repeated treatment. If this is the case, you may require further evaluation by an eye specialist (ophthalmologist). HOME CARE INSTRUCTIONS   Keep your hands clean.  Use a clean towel each time you dry your eyelids. Do not use this towel to clean other areas. Do not share a towel or makeup with anyone.  Wash your eyelids with warm water or warm water mixed with a small amount of baby shampoo.  Do this twice a day or as often as needed.  Wash your face and eyebrows at least once a day.  Use warm compresses 2 times a day for 10 minutes at a time, or as directed by your caregiver.  Apply antibiotic ointment as directed by your caregiver.  Avoid rubbing your eyes.  Avoid wearing makeup until you get better.  Follow up with your caregiver as directed. SEEK IMMEDIATE MEDICAL CARE IF:   You have pain, redness, or swelling that gets worse or spreads to other parts of your face.  Your vision changes, or you have pain when looking at lights or moving objects.  You have a fever.  Your symptoms continue for longer than 2 to 4 days or become worse. MAKE SURE YOU:   Understand these instructions.  Will watch your condition.  Will get help right away if you are not doing well or get worse. Document Released: 08/07/2000 Document Revised: 11/02/2011 Document Reviewed: 09/17/2010 [facility] Patient Information 2015 [place], [place]. This information is not intended to replace advice given to you by your health care provider. Make sure you discuss any questions you have with your health care provider.  Chalazion A chalazion is a swelling or hard lump on the eyelid caused by a blocked oil gland. Chalazions may occur on the upper or the lower eyelid.  CAUSES  Oil gland in the eyelid becomes blocked. SYMPTOMS   Swelling or hard lump on the eyelid. This lump may make it hard to see out of the eye.  The swelling may spread  to areas around the eye. TREATMENT   Although some chalazions disappear by themselves in 1 or 2 months, some chalazions may need to be removed.  Medicines to treat an infection may be required. HOME CARE INSTRUCTIONS   Wash your hands often and dry them with a clean towel. Do not touch the chalazion.  Apply heat to the eyelid several times a day for 10 minutes to help ease discomfort and bring any yellowish white fluid (pus) to the surface. One way to apply heat  to a chalazion is to use the handle of a metal spoon.  Hold the handle under hot water until it is hot, and then wrap the handle in paper towels so that the heat can come through without burning your skin.  Hold the wrapped handle against the chalazion and reheat the spoon handle as needed.  Apply heat in this fashion for 10 minutes, 4 times per day.  Return to your caregiver to have the pus removed if it does not break (rupture) on its own.  Do not try to remove the pus yourself by squeezing the chalazion or sticking it with a pin or needle.  Only take over-the-counter or prescription medicines for pain, discomfort, or fever as directed by your caregiver. SEEK IMMEDIATE MEDICAL CARE IF:   You have pain in your eye.  Your vision changes.  The chalazion does not go away.  The chalazion becomes painful, red, or swollen, grows larger, or does not start to disappear after 2 weeks. MAKE SURE YOU:   Understand these instructions.  Will watch your condition.  Will get help right away if you are not doing well or get worse. Document Released: 08/07/2000 Document Revised: 11/02/2011 Document Reviewed: 11/25/2009 [facility] Patient Information 2015 [place], [place]. This information is not intended to replace advice given to you by your health care provider. Make sure you discuss any questions you have with your health care provider.   Emergency Department Resource Guide 1) Find a Doctor and Pay Out of Pocket Although you won't have to find out who is covered by your insurance plan, it is a good idea to ask around and get recommendations. You will then need to call the office and see if the doctor you have chosen will accept you as a new patient and what types of options they offer for patients who are self-pay. Some doctors offer discounts or will set up payment plans for their patients who do not have insurance, but you will need to ask so you aren't surprised when you get to your  appointment.  2) Contact Your Local Health Department Not all health departments have doctors that can see patients for sick visits, but many do, so it is worth a call to see if yours does. If you don't know where your local health department is, you can check in your phone book. The CDC also has a tool to help you locate your state's health department, and many state websites also have listings of all of their local health departments.  3) Find a [facility] If your illness is not likely to be very severe or complicated, you may want to try a walk in clinic. These are popping up all over the country in pharmacies, drugstores, and shopping centers. They're usually staffed by nurse practitioners or physician assistants that have been trained to treat common illnesses and complaints. They're usually fairly quick and inexpensive. However, if you have serious medical issues or chronic medical problems, these are probably  not your best option.  No Primary Care Doctor: - Call Health Connect at  [phone] - they can help you locate a primary care doctor that  accepts your insurance, provides certain services, etc. - Physician Referral Service- [phone]  Chronic Pain Problems: Organization         Address  Phone   Notes  [facility]  [phone] Patients need to be referred by their primary care doctor.   Medication Assistance: Organization         Address  Phone   Notes  [facility] Medication [facility] [address] [place], [place] [address] [phone] --Must be a resident of [facility] -- Must have NO insurance coverage whatsoever (no Medicaid/ Medicare, etc.) -- The pt. MUST have a primary care doctor that directs their care regularly and follows them in the community   Med[facility]ist  [phone]   [employer]  [phone]    Agencies that provide inexpensive medical care: Organization         Address  Phone   Notes  [place]  [phone]   [facility]    [phone]   [facility] [place], [place] [address] [phone]   [place] [address], [place] [phone]   Planned Parenthood    [phone]   [facility]    [phone]   [place] and [address], [place] Phone:  [phone], Fax:  [phone] Hours of Operation:  9 am - 6 pm, M-F.  Also accepts Medicaid/Medicare and self-pay.  [facility] [place], [address], [place] Phone: [phone], Fax: [phone]. Hours of Operation:  8:30 am - 5:30 pm, M-F.  Also accepts Medicaid and self-pay.  [facility] [address] [place] Phone: [phone]   [place], [place], [place] [phone], Ext. [phone] Mondays & Thursdays: 7-9 AM.  First 15 patients are seen on a first come, first serve basis.    [place] Providers:  Organization         Address  Phone   Notes  [facility] [address], [place] [phone] Also accepts self-pay patients.  [facility] [address], [place]  [phone]   [place], [address], [place] [phone]   [facility] [address], [place] [phone]   Lucianne Lei [address], [place]   [phone] Only accepts [place] Access [place] patients after they have their name applied to their card.   Self-Pay (no insurance) in [facility]:  Organization         Address  Phone   Notes  Sickle Cell Patients, [facility] Internal Medicine [place] [phone]   [facility] Urgent Care [place] [phone]   [facility]  [place]  [place], [place],  [phone]   [facility]/Dr. Osei-Bonsu  [address] [place] or [address], [place] [phone] Phone number for both [place] and [place] locations is the same.  [facility] [address], [place] [phone]   [facility] [address], [place] or [address] [phone]   [facility] [address] [place] [phone], phone; [phone], fax Sees patients 1st and 3rd Saturday of every month.  Must not qualify for public or private insurance (i.e. Medicaid, Medicare, [place] Health Choice, Veterans' Benefits)  Household income should be no more than 200% of the poverty level The clinic cannot treat you if you are pregnant or think you are pregnant  Sexually transmitted diseases are not treated at the clinic.    Dental Care: Organization         Address  Phone  Notes  [facility] Department of [facility] [place] [phone] Accepts children up to age 57 who are enrolled in [place] or [place]; pregnant women with a Medicaid card; and children who have applied for Medicaid or [place] Health Choice, but were declined, whose parents can pay a reduced fee at time of service.  [facility] Department of [facility]  [address], [place] [phone] Accepts children up to age 56 who are enrolled in [place] or [place]; pregnant women with a Medicaid card; and children who have applied for Medicaid or  Health Choice, but were declined, whose parents can pay a reduced fee at time of service.  [place] Adult Dental Access PROGRAM  [place] [phone] Patients are seen by appointment only. Walk-ins are not accepted. [place] will see patients 39 years of age and  older. Monday - Tuesday (8am-5pm) Most Wednesdays (8:30-5pm) $30 per visit, cash only  [facility] Adult Dental Access PROGRAM  [address], [facility] [phone] Patients are seen by appointment only. Walk-ins are not accepted. [place] will see patients 70 years of age and older. One Wednesday Evening (Monthly: Volunteer Based).  $30 per visit, cash only  [place]  [phone] for adults; Children under age 59, call Graduate Pediatric Dentistry at [phone]. Children aged 39-14, please call [phone] to request a pediatric application.  Dental services are provided in all areas of dental care including fillings, crowns and bridges, complete and partial dentures, implants, gum treatment, root canals, and extractions. Preventive care is also provided. Treatment is provided to both adults and children. Patients are selected via a lottery and there is often a waiting list.   [facility] [address], [place]  [phone] [email or web address]   [facility] [address] [place], [place] [phone], Ext. [phone] Second and Fourth Thursday of each month, opens at 6:30 AM; Clinic ends at 9 AM.  Patients are seen on a first-come first-served basis, and a limited number are seen during each clinic.   [facility]  [address] Hill[place] Danker [place], [place] [phone]   Eligibility Requirements You must have lived in [place], [place], or [place] counties for at least the last three months.   You cannot be eligible for state or federal sponsored [employer], including [employer], [place], or [employer].   You generally cannot be eligible for healthcare insurance through your employer.    How to apply: Eligibility screenings are held every Tuesday and Wednesday afternoon from 1:00 pm until 4:00 pm. You do not need an appointment for the interview!  [facility] [address]  Barbara Cower  [place], [place]   [place]  [place] Department  [place]  [phone]    Behavioral Health Resources in the Community: Intensive Outpatient Programs Organization         Address  Phone  Notes  [place]. [address], [place], [place] [phone]   [facility] [address] [place], [place]   ADS: Alcohol & Drug Svcs [address], [place], [place]   [place] [address]  [place], [place] or [phone]   Substance Abuse Resources Organization         Address  Phone  Notes  Alcohol and Drug Services  [phone]   [place]  [phone]   [facility] [place]   Chinita Pester  [phone]   Residential & Outpatient Substance Abuse Program  [phone]   Psychological Services Organization         Address  Phone  Notes  [facility] [place]  [place]  [phone]   [place] [address] [place] or [phone]    Mobile Crisis Teams Organization         Address  Phone  Notes  [facility], Mobile Crisis Care Unit  [phone]   [facility]  [address] [place], [place]   [place] [address] [place] [phone]    Self-Help/Support Groups Organization         Address  Phone             Notes  [place]. of [place] - variety of support groups  [place] Call for more information  Narcotics Anonymous (NA), [facility] [address], [employer] [place]  2 meetings at this location   Special educational needs teacher         Address  Phone  Notes  [facility] Residential Treatment [place],    [place]  [phone]   [facility]  [address] [place] [address]93, [place], [place]   [place], [place] [phone] Admissions: 8am-3pm M-F  Incentives Substance [place] [address]    [place], [place] [phone]   [facility] [address] [place], [place], [place]   [facility] [address]  [place], [place]   [facility] Programs - Intensive Outpatient [address] Kristeen [address], [place], [place]   [facility] ([place].,  [place], [place] [phone] or [phone]   [facility] Services (RTS) [address], [place], [place] Accepts Medicaid  [facility] [place] [address],  [place] [phone] Substance Abuse/Addiction Treatment   [facility] Organization         Address  Phone  Notes  [facility]  [phone]   Domenic Schwab, PhD [address] Arlis Porta [place], [place]   [phone] or [phone]   [place], [place] [phone]   [place] [address] [place], [place] [phone] Insurance/Medicaid/sponsorship through [employer] and Families [address], [address]  [place], [place] [phone] [place], [place] [phone]    Dr. Adele Schilder  [phone]   [facility] Dept. 1) [address] [place] 2) [place] 3)  [address], [place] [phone]  [phone]   [place] [phone] or [phone] (After Hours)

## 2014-03-03 NOTE — ED Notes (Signed)
Declined W/C at D/C and was escorted to lobby by RN. 

## 2014-03-03 NOTE — ED Notes (Signed)
Pt reports eye pain and itching started on Thur.

## 2014-03-04 NOTE — ED Provider Notes (Signed)
Medical screening examination/treatment/procedure(s) were performed by non-physician practitioner and as supervising physician I was immediately available for consultation/collaboration.   EKG Interpretation None        Osvaldo Shipper, MD 03/04/14 1523

## 2014-04-11 ENCOUNTER — Telehealth: Payer: Self-pay | Admitting: *Deleted

## 2014-04-11 NOTE — Telephone Encounter (Signed)
Fax received from pharmacy requesting refill of Ibuprofen 800 mg #[id number]

## 2014-04-18 ENCOUNTER — Other Ambulatory Visit: Payer: Self-pay | Admitting: *Deleted

## 2014-04-18 ENCOUNTER — Telehealth: Payer: Self-pay | Admitting: *Deleted

## 2014-04-18 DIAGNOSIS — H9209 Otalgia, unspecified ear: Secondary | ICD-10-CM

## 2014-04-18 NOTE — Telephone Encounter (Signed)
Pt placed call to office requesting appt for ear ache and throat pain.   Return call to pt making her aware of referral for PCP.  Pt advised to be seen at urgent care.  Pt states that she plans to return to urgent care tomorrow morning in order for office to call here for verification to be seen.  Pt made aware that we will follow up on referral and let her know about scheduling.

## 2014-04-19 ENCOUNTER — Telehealth: Payer: Self-pay

## 2014-04-19 NOTE — Telephone Encounter (Signed)
CALLED PATIENT AND LET HER KNOW ABOUT REFERRAL TO [facility] - THEY WILL CALL HER AND SET-UP APPT - SENT PATIENT'S INFO TO THEM - THEY TAKE MEDICAID - SHE SAID SHE WAS SEEING [facility] THIS AM, BUT WANTED A PRIMARY CARE PHYSICIAN, ANYWAY.

## 2014-05-04 ENCOUNTER — Other Ambulatory Visit: Payer: Self-pay | Admitting: *Deleted

## 2014-05-04 DIAGNOSIS — N946 Dysmenorrhea, unspecified: Secondary | ICD-10-CM

## 2014-05-04 MED ORDER — OXYCODONE HCL 10 MG PO TABS: 10.0000 mg | ORAL_TABLET | Freq: Four times a day (QID) | ORAL | Status: DC | PRN

## 2014-05-04 MED ORDER — IBUPROFEN 600 MG PO TABS: 600.0000 mg | ORAL_TABLET | Freq: Four times a day (QID) | ORAL | Status: DC | PRN

## 2014-05-04 MED ORDER — MEDROXYPROGESTERONE ACETATE 150 MG/ML IM SUSP: 150.0000 mg | INTRAMUSCULAR | Status: DC

## 2014-05-07 ENCOUNTER — Telehealth: Payer: Self-pay | Admitting: *Deleted

## 2014-05-07 NOTE — Telephone Encounter (Signed)
Patient called stating she had an Rx for DEPO Provera, When does she come in? Patient states she is on her cycle now.  CB: 5:48pm, LM letting patient know she could come in anytime this week and that if she had any questions or concerns to call the office.

## 2014-05-08 ENCOUNTER — Ambulatory Visit (INDEPENDENT_AMBULATORY_CARE_PROVIDER_SITE_OTHER): Payer: Medicaid Other | Admitting: *Deleted

## 2014-05-08 VITALS — BP 120/75 | HR 81 | Temp 97.0°F | Ht 64.0 in | Wt 182.0 lb

## 2014-05-08 DIAGNOSIS — Z3049 Encounter for surveillance of other contraceptives: Secondary | ICD-10-CM

## 2014-05-08 MED ORDER — MEDROXYPROGESTERONE ACETATE 150 MG/ML IM SUSP
150.0000 mg | INTRAMUSCULAR | Status: AC
  Administered 2014-05-08 – 2015-01-14 (×4): 150 mg via INTRAMUSCULAR

## 2014-05-08 NOTE — Progress Notes (Signed)
Patient in the office for a Depo Injection. Patient is just starting the Depo. Pregnancy test in office is negative. Patient states she last had intercourse around a month ago. Patient tolerated injection well.  Patient due for next injection on 07-30-14.  BP 120/75  Pulse 81  Temp(Src) 97 F (36.1 C)  Ht 5\' 4"  (1.626 m)  Wt 182 lb (82.555 kg)  BMI 31.22 kg/m2  LMP 05/06/2014  Administrations This Visit   medroxyPROGESTERone (DEPO-PROVERA) injection 150 mg   Administered Action Dose Route Administered By   05/08/2014 Given 150 mg Intramuscular Sherrin Daisy, LPN

## 2014-07-31 ENCOUNTER — Ambulatory Visit (INDEPENDENT_AMBULATORY_CARE_PROVIDER_SITE_OTHER): Payer: Medicaid Other | Admitting: *Deleted

## 2014-07-31 VITALS — BP 121/82 | HR 87 | Temp 97.8°F | Ht 64.0 in | Wt 177.0 lb

## 2014-07-31 DIAGNOSIS — Z3049 Encounter for surveillance of other contraceptives: Secondary | ICD-10-CM

## 2014-07-31 DIAGNOSIS — Z3042 Encounter for surveillance of injectable contraceptive: Secondary | ICD-10-CM

## 2014-07-31 NOTE — Progress Notes (Signed)
Patient in office today for a Depo Injection. Patient states she has had some increased headaches and increased acne.  Patient due to return to office October 23, 2014  BP 121/82 mmHg  Pulse 87  Temp(Src) 97.8 F (36.6 C)  Ht 5\' 4"  (1.626 m)  Wt 177 lb (80.287 kg)  BMI 30.37 kg/m2

## 2014-09-11 ENCOUNTER — Ambulatory Visit (INDEPENDENT_AMBULATORY_CARE_PROVIDER_SITE_OTHER): Payer: Medicaid Other | Admitting: Obstetrics

## 2014-09-11 ENCOUNTER — Encounter: Payer: Self-pay | Admitting: Obstetrics

## 2014-09-11 VITALS — BP 126/75 | HR 89 | Temp 98.2°F | Wt 179.0 lb

## 2014-09-11 DIAGNOSIS — N760 Acute vaginitis: Secondary | ICD-10-CM

## 2014-09-11 DIAGNOSIS — B9689 Other specified bacterial agents as the cause of diseases classified elsewhere: Secondary | ICD-10-CM

## 2014-09-11 DIAGNOSIS — A499 Bacterial infection, unspecified: Secondary | ICD-10-CM

## 2014-09-11 MED ORDER — METRONIDAZOLE 500 MG PO TABS: 500.0000 mg | ORAL_TABLET | Freq: Two times a day (BID) | ORAL | Status: DC

## 2014-09-11 NOTE — Progress Notes (Signed)
   Patient ID: Hannah Fleming, female   DOB: Feb 26, 1981, 34 y.o.   MRN: [id number]  Chief Complaint  Patient presents with  . Vaginitis    HPI Hannah Fleming is a 34 y.o. female.  Vaginal discharge with odor.  HPI  Past Medical History  Diagnosis Date  . No pertinent past medical history     Past Surgical History  Procedure Laterality Date  . Dilation and curettage of uterus      Family History  Problem Relation Age of Onset  . Other Neg Hx     Social History History  Substance Use Topics  . Smoking status: Former Smoker    Types: Cigarettes    Quit date: 02/07/2009  . Smokeless tobacco: Never Used  . Alcohol Use: 0.0 oz/week    0 Not specified per week     Comment: occasional    Allergies  Allergen Reactions  . Latex Itching and Rash    Current Outpatient Prescriptions  Medication Sig Dispense Refill  . clonazePAM (KLONOPIN) 0.5 MG tablet Take 0.5 mg by mouth 2 (two) times daily as needed for anxiety.    Marland Kitchen ibuprofen (ADVIL,MOTRIN) 600 MG tablet Take 1 tablet (600 mg total) by mouth every 6 (six) hours as needed. (Patient not taking: Reported on 07/31/2014) 30 tablet 0  . medroxyPROGESTERone (DEPO-PROVERA) 150 MG/ML injection Inject 1 mL (150 mg total) into the muscle every 3 (three) months. 1 mL 3  . metroNIDAZOLE (FLAGYL) 500 MG tablet Take 1 tablet (500 mg total) by mouth 2 (two) times daily. 14 tablet 2   Current Facility-Administered Medications  Medication Dose Route Frequency Provider Last Rate Last Dose  . medroxyPROGESTERone (DEPO-PROVERA) injection 150 mg  150 mg Intramuscular Q90 days Shelly Bombard, MD   150 mg at 07/31/14 1117    Review of Systems Review of Systems Constitutional: negative for fatigue and weight loss Respiratory: negative for cough and wheezing Cardiovascular: negative for chest pain, fatigue and palpitations Gastrointestinal: negative for abdominal pain and change in bowel habits Genitourinary: vaginal discharge with  odor Integument/breast: negative for nipple discharge Musculoskeletal:negative for myalgias Neurological: negative for gait problems and tremors Behavioral/Psych: negative for abusive relationship, depression Endocrine: negative for temperature intolerance     Blood pressure 126/75, pulse 89, temperature 98.2 F (36.8 C), weight 179 lb (81.194 kg).  Physical Exam Physical Exam General:   alert  Skin:   no rash or abnormalities  Lungs:   clear to auscultation bilaterally  Heart:   regular rate and rhythm, S1, S2 normal, no murmur, click, rub or gallop  Breasts:   normal without suspicious masses, skin or nipple changes or axillary nodes  Abdomen:  normal findings: no organomegaly, soft, non-tender and no hernia  Pelvis:  External genitalia: normal general appearance Urinary system: urethral meatus normal and bladder without fullness, nontender Vaginal: normal without tenderness, induration or masses Cervix: normal appearance Adnexa: normal bimanual exam Uterus: anteverted and non-tender, normal size      Data Reviewed Labs  A/P:  BV.  Flagyl Rx.

## 2014-09-14 LAB — SURESWAB, VAGINOSIS/VAGINITIS PLUS
Atopobium vaginae: 6.3 Log (cells/mL)
C. albicans, DNA: NOT DETECTED
C. glabrata, DNA: NOT DETECTED
C. parapsilosis, DNA: NOT DETECTED
C. trachomatis RNA, TMA: NOT DETECTED
C. tropicalis, DNA: NOT DETECTED
Gardnerella vaginalis: >8 Log (cells/mL)
LACTOBACILLUS SPECIES: NOT DETECTED Log (cells/mL)
MEGASPHAERA SPECIES: 7.8 Log (cells/mL)
N. gonorrhoeae RNA, TMA: NOT DETECTED
T. vaginalis RNA, QL TMA: NOT DETECTED

## 2014-10-23 ENCOUNTER — Encounter: Payer: Self-pay | Admitting: Certified Nurse Midwife

## 2014-10-23 ENCOUNTER — Ambulatory Visit (INDEPENDENT_AMBULATORY_CARE_PROVIDER_SITE_OTHER): Payer: Medicaid Other | Admitting: *Deleted

## 2014-10-23 ENCOUNTER — Ambulatory Visit (INDEPENDENT_AMBULATORY_CARE_PROVIDER_SITE_OTHER): Payer: Medicaid Other | Admitting: Certified Nurse Midwife

## 2014-10-23 VITALS — BP 112/73 | HR 74 | Temp 98.8°F | Ht 64.0 in | Wt 173.0 lb

## 2014-10-23 DIAGNOSIS — Z3042 Encounter for surveillance of injectable contraceptive: Secondary | ICD-10-CM

## 2014-10-23 DIAGNOSIS — Z01419 Encounter for gynecological examination (general) (routine) without abnormal findings: Secondary | ICD-10-CM

## 2014-10-23 DIAGNOSIS — N939 Abnormal uterine and vaginal bleeding, unspecified: Secondary | ICD-10-CM

## 2014-10-23 DIAGNOSIS — J0191 Acute recurrent sinusitis, unspecified: Secondary | ICD-10-CM

## 2014-10-23 DIAGNOSIS — R5382 Chronic fatigue, unspecified: Secondary | ICD-10-CM

## 2014-10-23 LAB — CBC WITH DIFFERENTIAL/PLATELET
BASOS PCT: 0 % (ref 0–1)
Basophils Absolute: 0 10*3/uL (ref 0.0–0.1)
EOS ABS: 0.2 10*3/uL (ref 0.0–0.7)
Eosinophils Relative: 2 % (ref 0–5)
HCT: 39.9 % (ref 36.0–46.0)
Hemoglobin: 12.7 g/dL (ref 12.0–15.0)
Lymphocytes Relative: 35 % (ref 12–46)
Lymphs Abs: 3.3 10*3/uL (ref 0.7–4.0)
MCH: 29.1 pg (ref 26.0–34.0)
MCHC: 31.8 g/dL (ref 30.0–36.0)
MCV: 91.3 fL (ref 78.0–100.0)
MPV: 10.8 fL (ref 8.6–12.4)
Monocytes Absolute: 0.5 10*3/uL (ref 0.1–1.0)
Monocytes Relative: 5 % (ref 3–12)
NEUTROS PCT: 58 % (ref 43–77)
Neutro Abs: 5.4 10*3/uL (ref 1.7–7.7)
Platelets: 285 10*3/uL (ref 150–400)
RBC: 4.37 MIL/uL (ref 3.87–5.11)
RDW: 13.5 % (ref 11.5–15.5)
WBC: 9.3 10*3/uL (ref 4.0–10.5)

## 2014-10-23 LAB — TSH: TSH: 1.034 u[IU]/mL (ref 0.350–4.500)

## 2014-10-23 NOTE — Progress Notes (Signed)
Patient is in the office today for her DEPO Injection. Patient is on time for her Injection. Injection given in Left Upper Outer Quadrant. Patient tolerated well. Patient did voice some concerns and was seen by our CNM for AEX and concerns today also. Front notified that the patient needs an appointment for Jan 14, 2015 for her next DEPO Injection. Patient voiced understanding.   BP 112/73 mmHg  Pulse 74  Temp(Src) 98.8 F (37.1 C)  Ht 5\' 4"  (1.626 m)  Wt 173 lb (78.472 kg)  BMI 29.68 kg/m2  LMP 10/09/2014  Administrations This Visit    medroxyPROGESTERone (DEPO-PROVERA) injection 150 mg    Admin Date Action Dose Route Administered By         10/23/2014 Given 150 mg Intramuscular Ladona Ridgel, LPN

## 2014-10-23 NOTE — Progress Notes (Signed)
Patient ID: Hannah Fleming, female   DOB: [birth year], 34 y.o.   MRN: [id number]   Subjective:     Hannah Fleming is a 34 y.o. female here for a routine exam.  Current complaints: here for Depo injection and annual exam.  Complaints of fatigue, works shift work at night.  Has a 35 year old child at home.  Also, complaints of otitis.  Desires ENT referral. She does not have a PCP.  She does complain of spotting with Depo injections, denies any heavy bleeding or clots.    Personal health questionnaire:  Is patient Hannah Fleming, have a family history of breast and/or ovarian cancer: no Is there a family history of uterine cancer diagnosed at age < 44, gastrointestinal cancer, urinary tract cancer, family member who is a Field seismologist syndrome-associated carrier: no Is the patient overweight and hypertensive, family history of diabetes, personal history of gestational diabetes, preeclampsia or PCOS: yes Is patient over 69, have PCOS,  family history of premature CHD under age 21, diabetes, smoke, have hypertension or peripheral artery disease:  no At any time, has a partner hit, kicked or otherwise hurt or frightened you?: no Over the past 2 weeks, have you felt down, depressed or hopeless?: no Over the past 2 weeks, have you felt little interest or pleasure in doing things?:no   Gynecologic History Patient's last menstrual period was 10/09/2014. Contraception: Depo-Provera injections Last Pap: unknown. Results were: unknown  Obstetric History OB History  Gravida Para Term Preterm AB SAB TAB Ectopic Multiple Living  7 3 3  4 1 3   3     # Outcome Date GA Lbr Len/2nd Weight Sex Delivery Anes PTL Lv  7 TAB           6 TAB           5 Term     M Vag-Spont     4 Term     M Vag-Spont     3 TAB           2 Term     F Vag-Spont     1 SAB              Comments: System Generated. Please review and update pregnancy details.      Past Medical History  Diagnosis Date  . No pertinent past medical  history     Past Surgical History  Procedure Laterality Date  . Dilation and curettage of uterus       Current outpatient prescriptions:  .  clonazePAM (KLONOPIN) 0.5 MG tablet, Take 0.5 mg by mouth 2 (two) times daily as needed for anxiety., Disp: , Rfl:  .  ibuprofen (ADVIL,MOTRIN) 600 MG tablet, Take 1 tablet (600 mg total) by mouth every 6 (six) hours as needed., Disp: 30 tablet, Rfl: 0 .  medroxyPROGESTERone (DEPO-PROVERA) 150 MG/ML injection, Inject 1 mL (150 mg total) into the muscle every 3 (three) months., Disp: 1 mL, Rfl: 3 .  metroNIDAZOLE (FLAGYL) 500 MG tablet, Take 1 tablet (500 mg total) by mouth 2 (two) times daily. (Patient not taking: Reported on 10/23/2014), Disp: 14 tablet, Rfl: 2  Current facility-administered medications:  .  medroxyPROGESTERone (DEPO-PROVERA) injection 150 mg, 150 mg, Intramuscular, Q90 days, Shelly Bombard, MD, 150 mg at 10/23/14 1028 Allergies  Allergen Reactions  . Latex Itching and Rash    History  Substance Use Topics  . Smoking status: Former Smoker    Types: Cigarettes    Quit date: 02/07/2009  .  Smokeless tobacco: Never Used  . Alcohol Use: 0.0 oz/week    0 Standard drinks or equivalent per week     Comment: occasional    Family History  Problem Relation Age of Onset  . Other Neg Hx       Review of Systems  Constitutional: negative for fatigue and weight loss Respiratory: negative for cough and wheezing Cardiovascular: negative for chest pain, fatigue and palpitations Gastrointestinal: negative for abdominal pain and change in bowel habits Musculoskeletal:negative for myalgias Neurological: negative for gait problems and tremors Behavioral/Psych: negative for abusive relationship, depression Endocrine: negative for temperature intolerance   Genitourinary:negative for abnormal menstrual periods, genital lesions, hot flashes, sexual problems and vaginal discharge Integument/breast: negative for breast lump, breast tenderness,  nipple discharge and skin lesion(s)    Objective:       LMP 10/09/2014 General:   alert  Skin:   no rash or abnormalities  Lungs:   clear to auscultation bilaterally  Heart:   regular rate and rhythm, S1, S2 normal, no murmur, click, rub or gallop  Breasts:   normal without suspicious masses, skin or nipple changes or axillary nodes  Abdomen:  normal findings: no organomegaly, soft, non-tender and no hernia  Pelvis:  External genitalia: normal general appearance Urinary system: urethral meatus normal and bladder without fullness, nontender Vaginal: normal without tenderness, induration or masses Cervix: normal appearance Adnexa: normal bimanual exam Uterus: anteverted and non-tender, normal size   Lab Review Urine pregnancy test Labs reviewed yes Radiologic studies reviewed no   Assessment:    Healthy female exam.  Otitis Fatigue d/t shift work   Plan:    Education reviewed: low fat, low cholesterol diet, safe sex/STD prevention, self breast exams and weight bearing exercise. Contraception: Depo-Provera injections. Follow up in: 1 year.   No orders of the defined types were placed in this encounter.   Orders Placed This Encounter  Procedures  . SureSwab, Vaginosis/Vaginitis Plus  . RPR  . Hepatitis B surface antigen  . Hepatitis C antibody  . HIV antibody (with reflex)  . CBC with Differential/Platelet  . TSH  . Basic metabolic panel  . Ambulatory referral to ENT    Referral Priority:  Routine    Referral Type:  Consultation    Referral Reason:  Specialty Services Required    Requested Specialty:  Otolaryngology    Number of Visits Requested:  1  . Ambulatory referral to Internal Medicine    Referral Priority:  Routine    Referral Type:  Consultation    Referral Reason:  Specialty Services Required    Requested Specialty:  Internal Medicine    Number of Visits Requested:  1   Possible management options include: OCO or another contraception form for  breakthrough bleeding.

## 2014-10-24 ENCOUNTER — Telehealth: Payer: Self-pay

## 2014-10-24 LAB — HIV ANTIBODY (ROUTINE TESTING W REFLEX): HIV: NONREACTIVE

## 2014-10-24 LAB — HEPATITIS C ANTIBODY: HCV Ab: NEGATIVE

## 2014-10-24 LAB — RPR

## 2014-10-24 LAB — BASIC METABOLIC PANEL
BUN: 14 mg/dL (ref 6–23)
CO2: 21 mEq/L (ref 19–32)
Calcium: 9.2 mg/dL (ref 8.4–10.5)
Chloride: 106 mEq/L (ref 96–112)
Creat: 0.88 mg/dL (ref 0.50–1.10)
Glucose, Bld: 88 mg/dL (ref 70–99)
POTASSIUM: 4.3 meq/L (ref 3.5–5.3)
SODIUM: 140 meq/L (ref 135–145)

## 2014-10-24 LAB — HEPATITIS B SURFACE ANTIGEN: HEP B S AG: NEGATIVE

## 2014-10-24 NOTE — Telephone Encounter (Signed)
CALLED PATIENT AND LEFT MESSAGE REGARDING ENT APPT INFO

## 2014-10-25 LAB — PAP IG AND HPV HIGH-RISK: HPV DNA High Risk: NOT DETECTED

## 2014-10-26 LAB — SURESWAB, VAGINOSIS/VAGINITIS PLUS
ATOPOBIUM VAGINAE: NOT DETECTED Log (cells/mL)
C. ALBICANS, DNA: DETECTED — AB
C. glabrata, DNA: NOT DETECTED
C. parapsilosis, DNA: NOT DETECTED
C. trachomatis RNA, TMA: NOT DETECTED
C. tropicalis, DNA: NOT DETECTED
Gardnerella vaginalis: <4.7 Log (cells/mL)
LACTOBACILLUS SPECIES: NOT DETECTED Log (cells/mL)
MEGASPHAERA SPECIES: NOT DETECTED Log (cells/mL)
N. gonorrhoeae RNA, TMA: NOT DETECTED
T. VAGINALIS RNA, QL TMA: NOT DETECTED

## 2014-11-02 ENCOUNTER — Other Ambulatory Visit: Payer: Self-pay | Admitting: *Deleted

## 2014-11-02 DIAGNOSIS — B379 Candidiasis, unspecified: Secondary | ICD-10-CM

## 2014-11-02 MED ORDER — FLUCONAZOLE 200 MG PO TABS: 200.0000 mg | ORAL_TABLET | ORAL | Status: DC

## 2015-01-10 ENCOUNTER — Ambulatory Visit: Payer: Medicaid Other | Admitting: Obstetrics

## 2015-01-14 ENCOUNTER — Ambulatory Visit (INDEPENDENT_AMBULATORY_CARE_PROVIDER_SITE_OTHER): Payer: Medicaid Other | Admitting: *Deleted

## 2015-01-14 VITALS — BP 122/84 | HR 72 | Temp 98.4°F | Wt 179.0 lb

## 2015-01-14 DIAGNOSIS — Z3040 Encounter for surveillance of contraceptives, unspecified: Secondary | ICD-10-CM

## 2015-01-14 DIAGNOSIS — Z3042 Encounter for surveillance of injectable contraceptive: Secondary | ICD-10-CM

## 2015-01-14 NOTE — Progress Notes (Signed)
Pt is in office for Depo injection.  Pt is on time for her injection.  Pt has no other concerns today, states that she is doing well.   Pt tolerated injection well. Pt advised to RTO on April 07, 2015 for next injection.    BP 122/84 mmHg  Pulse 72  Temp(Src) 98.4 F (36.9 C)  Wt 179 lb (81.194 kg)  Administrations This Visit    medroxyPROGESTERone (DEPO-PROVERA) injection 150 mg    Admin Date Action Dose Route Administered By         01/14/2015 Given 150 mg Intramuscular Valene Bors, CMA

## 2015-03-07 ENCOUNTER — Telehealth: Payer: Self-pay | Admitting: *Deleted

## 2015-03-07 NOTE — Telephone Encounter (Signed)
Patient is using Depo and having spotting for 1 month- with cramping. 2:12 LM on VM to CB

## 2015-03-14 NOTE — Telephone Encounter (Signed)
Attempted to contact patient for follow up - LM on VM to CB if needed.

## 2015-04-07 ENCOUNTER — Other Ambulatory Visit: Payer: Self-pay | Admitting: Obstetrics

## 2015-04-07 DIAGNOSIS — Z3042 Encounter for surveillance of injectable contraceptive: Secondary | ICD-10-CM

## 2015-04-08 ENCOUNTER — Ambulatory Visit: Payer: Medicaid Other

## 2015-04-08 ENCOUNTER — Telehealth: Payer: Self-pay | Admitting: *Deleted

## 2015-04-08 NOTE — Telephone Encounter (Signed)
Pt contacted office stating she had requested information about an appt.   Pt would like to know if any information had been sent.  Return call to pt.  No answer, LM on VM to call office.

## 2015-04-09 ENCOUNTER — Ambulatory Visit: Payer: Medicaid Other

## 2015-04-09 ENCOUNTER — Telehealth: Payer: Self-pay | Admitting: *Deleted

## 2015-04-09 VITALS — BP 118/90 | HR 88 | Temp 98.1°F | Ht 64.0 in | Wt 186.2 lb

## 2015-04-09 DIAGNOSIS — Z789 Other specified health status: Secondary | ICD-10-CM

## 2015-04-09 DIAGNOSIS — Z3042 Encounter for surveillance of injectable contraceptive: Secondary | ICD-10-CM

## 2015-04-09 MED ORDER — MEDROXYPROGESTERONE ACETATE 150 MG/ML IM SUSP: 150.0000 mg | INTRAMUSCULAR | Status: DC

## 2015-04-09 MED ORDER — MEDROXYPROGESTERONE ACETATE 150 MG/ML IM SUSP
150.0000 mg | Freq: Once | INTRAMUSCULAR | Status: AC
  Administered 2015-04-09: 150 mg via INTRAMUSCULAR

## 2015-04-09 NOTE — Telephone Encounter (Signed)
Pt called to office to verify depo Rx was sent to pharmacy. Review chart, depo was sent on 8/15 by Dr Jodi Mourning.  Attempt to contact pt.  No answer, LM making pt aware that Rx should be at pharmacy.  Pt advised to call if any problems getting her Rx.

## 2015-04-09 NOTE — Telephone Encounter (Signed)
Pt had called office again, inquiring about depo Rx.  LM on VM for pt regarding depo.  See phone note.

## 2015-04-09 NOTE — Progress Notes (Unsigned)
Pt was seen in the office today to receive her depo injection. Pt. Received injection in the left arm. Pt. Took injection well and is aware of her next due date.  Due July 01, 2015.   Injection was given and prepared by Chandra Batch on 04-09-15.  Pt. Was released with no problems.

## 2015-04-19 ENCOUNTER — Telehealth: Payer: Self-pay

## 2015-04-19 NOTE — Telephone Encounter (Signed)
Gave patient the number for Dr. Dannielle Karvonen office for PCP - she did not like Dr. Nancy Fetter at [facility]

## 2015-07-01 ENCOUNTER — Ambulatory Visit: Payer: Medicaid Other

## 2015-09-04 ENCOUNTER — Encounter (HOSPITAL_COMMUNITY): Payer: Self-pay | Admitting: Emergency Medicine

## 2015-09-04 ENCOUNTER — Emergency Department (HOSPITAL_COMMUNITY)
Admission: EM | Admit: 2015-09-04 | Discharge: 2015-09-04 | Disposition: A | Discharge status: Home / Self Care | Payer: Medicaid Other | Attending: Emergency Medicine | Admitting: Emergency Medicine

## 2015-09-04 DIAGNOSIS — J069 Acute upper respiratory infection, unspecified: Secondary | ICD-10-CM | POA: Diagnosis not present

## 2015-09-04 DIAGNOSIS — Z91040 Latex allergy status: Secondary | ICD-10-CM | POA: Insufficient documentation

## 2015-09-04 DIAGNOSIS — Z87891 Personal history of nicotine dependence: Secondary | ICD-10-CM | POA: Insufficient documentation

## 2015-09-04 DIAGNOSIS — R0981 Nasal congestion: Secondary | ICD-10-CM | POA: Diagnosis present

## 2015-09-04 DIAGNOSIS — Z793 Long term (current) use of hormonal contraceptives: Secondary | ICD-10-CM | POA: Insufficient documentation

## 2015-09-04 MED ORDER — IBUPROFEN 200 MG PO TABS
600.0000 mg | ORAL_TABLET | Freq: Once | ORAL | Status: AC
  Administered 2015-09-04: 600 mg via ORAL
  Filled 2015-09-04: qty 3

## 2015-09-04 NOTE — ED Notes (Signed)
Samantha, PA at bedside  °

## 2015-09-04 NOTE — Discharge Instructions (Signed)
°  Viral Infections A viral infection can be caused by different types of viruses.Most viral infections are not serious and resolve on their own. However, some infections may cause severe symptoms and may lead to further complications. SYMPTOMS Viruses can frequently cause:  Minor sore throat.  Aches and pains.  Headaches.  Runny nose.  Different types of rashes.  Watery eyes.  Tiredness.  Cough.  Loss of appetite.  Gastrointestinal infections, resulting in nausea, vomiting, and diarrhea. These symptoms do not respond to antibiotics because the infection is not caused by bacteria. However, you might catch a bacterial infection following the viral infection. This is sometimes called a "superinfection." Symptoms of such a bacterial infection may include:  Worsening sore throat with pus and difficulty swallowing.  Swollen neck glands.  Chills and a high or persistent fever.  Severe headache.  Tenderness over the sinuses.  Persistent overall ill feeling (malaise), muscle aches, and tiredness (fatigue).  Persistent cough.  Yellow, green, or brown mucus production with coughing. HOME CARE INSTRUCTIONS   Only take over-the-counter or prescription medicines for pain, discomfort, diarrhea, or fever as directed by your caregiver.  Drink enough water and fluids to keep your urine clear or pale yellow. Sports drinks can provide valuable electrolytes, sugars, and hydration.  Get plenty of rest and maintain proper nutrition. Soups and broths with crackers or rice are fine. SEEK IMMEDIATE MEDICAL CARE IF:   You have severe headaches, shortness of breath, chest pain, neck pain, or an unusual rash.  You have uncontrolled vomiting, diarrhea, or you are unable to keep down fluids.  You or your child has an oral temperature above 102 F (38.9 C), not controlled by medicine.  Your baby is older than 3 months with a rectal temperature of 102 F (38.9 C) or higher.  Your baby is  70 months old or younger with a rectal temperature of 100.4 F (38 C) or higher. MAKE SURE YOU:   Understand these instructions.  Will watch your condition.  Will get help right away if you are not doing well or get worse.   This information is not intended to replace advice given to you by your health care provider. Make sure you discuss any questions you have with your health care provider.  Follow up with your primary care provider if symptoms do not improve. Drink plenty of fluids. Take ibuprofen as needed for headache and body aches. Take OTC Mucinex OR Sudafed for congestion symptoms. Return to the ED if you experience severe worsening of your symptoms, fever, difficulty breathing, loss of consciousness.

## 2015-09-04 NOTE — ED Provider Notes (Signed)
CSN: [id number]     Arrival date & time 09/04/15  0536 History   First MD Initiated Contact with Patient 09/04/15 [phone]     Chief Complaint  Patient presents with  . URI     (Consider location/radiation/quality/duration/timing/severity/associated sxs/prior Treatment) HPI   Hannah Fleming is a 35 y.o F with no significant pmhx who presents to the ED today c/o cold symptoms. Pt states that 5 days ago she began experiencing rhinorrhea and nasal congestion. Over the weekend, pt reports fevers at home with a Tmax of 100F. Pt states that she has been taking Theraflu with some relief. Pt states that she took one dose of ibuprofen 2 days ago for fever which resolved her symptoms. Pt now reporting headache and generalized body aches. Pt has been able to eat and drink appropriately. Denies cough, SOB, dizziness, N/V/D, sore throat, otalgia, blurry vision, paresthesias. No known sick contacts. Pt states that she has not been able to work due to her symptoms. Requesting work note.   Past Medical History  Diagnosis Date  . No pertinent past medical history    Past Surgical History  Procedure Laterality Date  . Dilation and curettage of uterus     Family History  Problem Relation Age of Onset  . Other Neg Hx    Social History  Substance Use Topics  . Smoking status: Former Smoker    Types: Cigarettes    Quit date: 02/07/2009  . Smokeless tobacco: Never Used  . Alcohol Use: 0.0 oz/week    0 Standard drinks or equivalent per week     Comment: occasional   OB History    Gravida Para Term Preterm AB TAB SAB Ectopic Multiple Living   7 3 3  4 3 1   3      Review of Systems  All other systems reviewed and are negative.     Allergies  Latex  Home Medications   Prior to Admission medications   Medication Sig Start Date End Date Taking? Authorizing Provider  clonazePAM (KLONOPIN) 0.5 MG tablet Take 0.5 mg by mouth 2 (two) times daily as needed for anxiety.    Historical Provider, MD   fluconazole (DIFLUCAN) 200 MG tablet Take 1 tablet (200 mg total) by mouth every other day. Patient not taking: Reported on 04/09/2015 11/02/14   Shelly Bombard, MD  ibuprofen (ADVIL,MOTRIN) 600 MG tablet Take 1 tablet (600 mg total) by mouth every 6 (six) hours as needed. 05/04/14   Shelly Bombard, MD  medroxyPROGESTERone (DEPO-PROVERA) 150 MG/ML injection INJECT 1 ML (150 MG TOTAL) INTO THE MUSCLE EVERY 3 (THREE) MONTHS. 04/08/15   Shelly Bombard, MD  medroxyPROGESTERone (DEPO-PROVERA) 150 MG/ML injection Inject 1 mL (150 mg total) into the muscle every 3 (three) months. 04/09/15   Shelly Bombard, MD  metroNIDAZOLE (FLAGYL) 500 MG tablet Take 1 tablet (500 mg total) by mouth 2 (two) times daily. Patient not taking: Reported on 10/23/2014 09/11/14   Shelly Bombard, MD   BP 101/83 mmHg  Pulse 84  Temp(Src) 98.9 F (37.2 C) (Oral)  Resp 20  Ht 5\' 4"  (1.626 m)  Wt 84.369 kg  BMI 31.91 kg/m2  SpO2 99% Physical Exam  Constitutional: She is oriented to person, place, and time. She appears well-developed and well-nourished. No distress.  HENT:  Head: Normocephalic and atraumatic.  Right Ear: External ear normal.  Left Ear: External ear normal.  Nose: Nose normal.  Mouth/Throat: Oropharynx is clear and moist. No oropharyngeal exudate.  Eyes: Conjunctivae and EOM are normal. Pupils are equal, round, and reactive to light. Right eye exhibits no discharge. Left eye exhibits no discharge. No scleral icterus.  Neck: Neck supple.  No meningismus.  Cardiovascular: Normal rate, regular rhythm, normal heart sounds and intact distal pulses.  Exam reveals no gallop and no friction rub.   No murmur heard. Pulmonary/Chest: Effort normal and breath sounds normal. No respiratory distress. She has no wheezes. She has no rales. She exhibits no tenderness.  Abdominal: Soft. Bowel sounds are normal. She exhibits no distension and no mass. There is no tenderness. There is no rebound and no guarding.   Musculoskeletal: Normal range of motion. She exhibits no edema.  Lymphadenopathy:    She has no cervical adenopathy.  Neurological: She is alert and oriented to person, place, and time. No cranial nerve deficit.  Strength 5/5 throughout. No sensory deficits.  No gait abnormality.  Skin: Skin is warm and dry. No rash noted. She is not diaphoretic. No erythema. No pallor.  Psychiatric: She has a normal mood and affect. Her behavior is normal.  Nursing note and vitals reviewed.   ED Course  Procedures (including critical care time) Labs Review Labs Reviewed - No data to display  Imaging Review No results found. I have personally reviewed and evaluated these images and lab results as part of my medical decision-making.   EKG Interpretation None      MDM   Final diagnoses:  Viral URI   Otherwise healthy 35 year old female presents for cold like symptoms onset 5 days ago. In ED patient is afebrile, appears well, in NAD. No cough or shortness of breath. Lungs CTAB. No CXR indicated at this time. Patients symptoms are consistent with URI, likely viral etiology. Discussed that antibiotics are not indicated for viral infections. Pt will be discharged with symptomatic treatment. Patient encouraged to stay adequately hydrated. May take ibuprofen as needed for headache and generalized body aches. Recommend OTC Mucinex or Sudafed for congestion-like symptoms. No history of HTN.  Verbalizes understanding and is agreeable with plan. Patient may follow up with primary care provider if symptoms do not improve. Pt is hemodynamically stable & in NAD prior to dc.     Dondra Spry [place], PA-C [id number]  David Glick, MD [id number]

## 2015-09-04 NOTE — ED Notes (Signed)
Pt arrives by POV with c/o cold symptoms. Nasal drainage began Friday, then congestion started, as well as alternating chills/sweats, fever, headache, sore throat, body aches. Denies cough, n/v/diarrhea.

## 2015-09-19 ENCOUNTER — Telehealth: Payer: Self-pay

## 2015-09-19 NOTE — Telephone Encounter (Signed)
Call to patient- LM on VM- we normally do referrals for our pregnant women only. We don not know what kind of problem she is having and would prefer she call her primary care for that. If she does not have a primary care she can let us know.

## 2015-09-19 NOTE — Telephone Encounter (Signed)
she left msg on your phone - needs referral put in for orthopedic dr

## 2015-09-25 ENCOUNTER — Telehealth: Payer: Self-pay

## 2015-09-25 NOTE — Telephone Encounter (Signed)
CALLED PATIENT BACK AND GAVE HER THE NUMBER OF [facility] FOR PCP - SHE SAID SHE WENT THERE ONCE - TOLD HER TO GET APPT WITH THEM TO GET REFERRAL FOR ORTHOPEDIC DR.

## 2015-12-12 ENCOUNTER — Encounter: Payer: Self-pay | Admitting: Obstetrics

## 2015-12-12 ENCOUNTER — Ambulatory Visit (INDEPENDENT_AMBULATORY_CARE_PROVIDER_SITE_OTHER): Payer: Medicaid Other | Admitting: Obstetrics

## 2015-12-12 VITALS — BP 121/76 | HR 98 | Wt 188.0 lb

## 2015-12-12 DIAGNOSIS — Z01419 Encounter for gynecological examination (general) (routine) without abnormal findings: Secondary | ICD-10-CM

## 2015-12-12 DIAGNOSIS — Z1389 Encounter for screening for other disorder: Secondary | ICD-10-CM | POA: Diagnosis not present

## 2015-12-12 DIAGNOSIS — Z3202 Encounter for pregnancy test, result negative: Secondary | ICD-10-CM

## 2015-12-12 DIAGNOSIS — Z0000 Encounter for general adult medical examination without abnormal findings: Secondary | ICD-10-CM | POA: Diagnosis not present

## 2015-12-12 DIAGNOSIS — N644 Mastodynia: Secondary | ICD-10-CM

## 2015-12-12 DIAGNOSIS — N912 Amenorrhea, unspecified: Secondary | ICD-10-CM

## 2015-12-12 LAB — POCT URINALYSIS DIPSTICK
Bilirubin, UA: NEGATIVE
GLUCOSE UA: NEGATIVE
Ketones, UA: NEGATIVE
Leukocytes, UA: NEGATIVE
NITRITE UA: NEGATIVE
PH UA: 6
SPEC GRAV UA: 1.02
UROBILINOGEN UA: NEGATIVE

## 2015-12-12 LAB — POCT URINE PREGNANCY: Preg Test, Ur: NEGATIVE

## 2015-12-12 NOTE — Progress Notes (Signed)
Subjective:        Hannah Fleming is a 35 y.o. female here for a routine exam.  Current complaints: Breast tenderness over past 3 weeks.  No masses felt.  Stopped Depo shots November 2016.  First period occurred this month, after stopping Depo Provera.  Personal health questionnaire:  Is patient Ashkenazi Jewish, have a family history of breast and/or ovarian cancer: no Is there a family history of uterine cancer diagnosed at age < 78, gastrointestinal cancer, urinary tract cancer, family member who is a Field seismologist syndrome-associated carrier: no Is the patient overweight and hypertensive, family history of diabetes, personal history of gestational diabetes, preeclampsia or PCOS: no Is patient over 60, have PCOS,  family history of premature CHD under age 24, diabetes, smoke, have hypertension or peripheral artery disease:  no At any time, has a partner hit, kicked or otherwise hurt or frightened you?: no Over the past 2 weeks, have you felt down, depressed or hopeless?: no Over the past 2 weeks, have you felt little interest or pleasure in doing things?:no   Gynecologic History Patient's last menstrual period was 12/05/2015. Contraception:  none Last Pap: 2016. Results were: normal Last mammogram: n/a. Results were: n/a  Obstetric History OB History  Gravida Para Term Preterm AB SAB TAB Ectopic Multiple Living  7 3 3  4 1 3   3     # Outcome Date GA Lbr Len/2nd Weight Sex Delivery Anes PTL Lv  7 SAB              Comments: System Generated. Please review and update pregnancy details.  6 TAB           5 TAB           4 Term     M Vag-Spont     3 Term     M Vag-Spont     2 TAB           1 Term     F Vag-Spont         Past Medical History  Diagnosis Date  . No pertinent past medical history     Past Surgical History  Procedure Laterality Date  . Dilation and curettage of uterus       Current outpatient prescriptions:  .  DULoxetine (CYMBALTA) 20 MG capsule, Take 20 mg  by mouth daily., Disp: , Rfl:  .  omeprazole (PRILOSEC) 10 MG capsule, Take 10 mg by mouth daily., Disp: , Rfl:  .  clonazePAM (KLONOPIN) 0.5 MG tablet, Take 0.5 mg by mouth 2 (two) times daily as needed for anxiety. Reported on 12/12/2015, Disp: , Rfl:  .  ibuprofen (ADVIL,MOTRIN) 600 MG tablet, Take 1 tablet (600 mg total) by mouth every 6 (six) hours as needed. (Patient not taking: Reported on 12/12/2015), Disp: 30 tablet, Rfl: 0 .  medroxyPROGESTERone (DEPO-PROVERA) 150 MG/ML injection, INJECT 1 ML (150 MG TOTAL) INTO THE MUSCLE EVERY 3 (THREE) MONTHS. (Patient not taking: Reported on 12/12/2015), Disp: 1 mL, Rfl: 3 .  medroxyPROGESTERone (DEPO-PROVERA) 150 MG/ML injection, Inject 1 mL (150 mg total) into the muscle every 3 (three) months. (Patient not taking: Reported on 12/12/2015), Disp: 1 mL, Rfl: 0 Allergies  Allergen Reactions  . Latex Itching and Rash    Social History  Substance Use Topics  . Smoking status: Former Smoker    Types: Cigarettes    Quit date: 02/07/2009  . Smokeless tobacco: Never Used  . Alcohol Use: 0.0 oz/week  0 Standard drinks or equivalent per week     Comment: occasional    Family History  Problem Relation Age of Onset  . Other Neg Hx       Review of Systems  Constitutional: negative for fatigue and weight loss Respiratory: negative for cough and wheezing Cardiovascular: negative for chest pain, fatigue and palpitations Gastrointestinal: negative for abdominal pain and change in bowel habits Musculoskeletal:negative for myalgias Neurological: negative for gait problems and tremors Behavioral/Psych: negative for abusive relationship, depression Endocrine: negative for temperature intolerance   Genitourinary:negative for abnormal menstrual periods, genital lesions, hot flashes, sexual problems and vaginal discharge Integument/breast: positive for breast tenderness    Objective:       BP 121/76 mmHg  Pulse 98  Wt 188 lb (85.276 kg)  LMP  12/05/2015 General:   alert  Skin:   no rash or abnormalities  Lungs:   clear to auscultation bilaterally  Heart:   regular rate and rhythm, S1, S2 normal, no murmur, click, rub or gallop  Breasts:   normal without suspicious masses, skin or nipple changes or axillary nodes  Abdomen:  normal findings: no organomegaly, soft, non-tender and no hernia  Pelvis:  External genitalia: normal general appearance Urinary system: urethral meatus normal and bladder without fullness, nontender Vaginal: normal without tenderness, induration or masses Cervix: normal appearance Adnexa: normal bimanual exam Uterus: anteverted and non-tender, normal size   Lab Review Urine pregnancy test Labs reviewed yes Radiologic studies reviewed no    Assessment:    Healthy female exam.    H/O post Depo amenorrhea  Breast tenderness   Plan:   Amenorrhea probably normal post Depo.  Will follow.  [facility]   Education reviewed: calcium supplements, depression evaluation, low fat, low cholesterol diet, safe sex/STD prevention, self breast exams and weight bearing exercise. Contraception: condoms. Mammogram ordered. Follow up in: 1 year.   Meds ordered this encounter  Medications  . omeprazole (PRILOSEC) 10 MG capsule    Sig: Take 10 mg by mouth daily.  . DULoxetine (CYMBALTA) 20 MG capsule    Sig: Take 20 mg by mouth daily.   Orders Placed This Encounter  Procedures  . MM Digital Diagnostic Bilat    Standing Status: Future     Number of Occurrences:      Standing Expiration Date: 02/10/2017    Order Specific Question:  Reason for Exam (SYMPTOM  OR DIAGNOSIS REQUIRED)    Answer:  Bilateral breast tenderness    Order Specific Question:  Is the patient pregnant?    Answer:  No    Order Specific Question:  Preferred imaging location?    Answer:  [facility]  . NuSwab Vaginitis Plus (VG+)

## 2015-12-15 LAB — NUSWAB VAGINITIS PLUS (VG+)
Atopobium vaginae: HIGH Score — AB
BVAB 2: HIGH Score — AB
CANDIDA ALBICANS, NAA: NEGATIVE
CANDIDA GLABRATA, NAA: NEGATIVE
Chlamydia trachomatis, NAA: NEGATIVE
Megasphaera 1: HIGH Score — AB
NEISSERIA GONORRHOEAE, NAA: NEGATIVE
TRICH VAG BY NAA: NEGATIVE

## 2015-12-16 ENCOUNTER — Other Ambulatory Visit: Payer: Self-pay | Admitting: Obstetrics

## 2015-12-16 ENCOUNTER — Encounter: Payer: Self-pay | Admitting: *Deleted

## 2015-12-16 DIAGNOSIS — N760 Acute vaginitis: Principal | ICD-10-CM

## 2015-12-16 DIAGNOSIS — B9689 Other specified bacterial agents as the cause of diseases classified elsewhere: Secondary | ICD-10-CM

## 2015-12-16 LAB — PAP IG AND HPV HIGH-RISK
HPV, HIGH-RISK: NEGATIVE
PAP SMEAR COMMENT: 0

## 2015-12-16 MED ORDER — METRONIDAZOLE 500 MG PO TABS: 500.0000 mg | ORAL_TABLET | Freq: Two times a day (BID) | ORAL | Status: DC

## 2015-12-17 ENCOUNTER — Other Ambulatory Visit: Payer: Self-pay | Admitting: Obstetrics

## 2015-12-17 DIAGNOSIS — N644 Mastodynia: Secondary | ICD-10-CM

## 2015-12-23 ENCOUNTER — Other Ambulatory Visit: Payer: Self-pay | Admitting: Obstetrics

## 2015-12-23 ENCOUNTER — Ambulatory Visit
Admission: RE | Admit: 2015-12-23 | Discharge: 2015-12-23 | Disposition: A | Discharge status: Home / Self Care | Payer: Medicaid Other | Source: Ambulatory Visit | Attending: Obstetrics | Admitting: Obstetrics

## 2015-12-23 DIAGNOSIS — N644 Mastodynia: Secondary | ICD-10-CM

## 2016-03-16 ENCOUNTER — Telehealth: Payer: Self-pay | Admitting: *Deleted

## 2016-03-16 MED ORDER — VALACYCLOVIR HCL 500 MG PO TABS: 500.0000 mg | ORAL_TABLET | Freq: Two times a day (BID) | ORAL | 99 refills | Status: DC

## 2016-03-16 NOTE — Telephone Encounter (Signed)
Patient called requesting a refill of her valtrex. It is over a year and she needs it renewed.LM on VM for patient- Rx sent to pharmacy.

## 2016-06-03 ENCOUNTER — Encounter: Payer: Self-pay | Admitting: Obstetrics and Gynecology

## 2016-06-03 ENCOUNTER — Ambulatory Visit (INDEPENDENT_AMBULATORY_CARE_PROVIDER_SITE_OTHER): Payer: Medicaid Other | Admitting: Obstetrics and Gynecology

## 2016-06-03 DIAGNOSIS — Z202 Contact with and (suspected) exposure to infections with a predominantly sexual mode of transmission: Secondary | ICD-10-CM

## 2016-06-03 NOTE — Progress Notes (Signed)
GYN Office Note  Pt presents with desires for STD testing. Pt reports intercourse without someone other than her partner about 2 months ago. She just noted some vaginal discharge and itching over the last few days.  PE Pelvic: Nl EGBUS, scant white discharge, cervix no lesion Nuswab collected  A/P STD exposure  Nuswab and blood work obtained today. Will contact pt with results. F/U per results or PRN

## 2016-06-03 NOTE — Patient Instructions (Signed)
Sexually Transmitted Disease °A sexually transmitted disease (STD) is a disease or infection that may be passed (transmitted) from person to person, usually during sexual activity. This may happen by way of saliva, semen, blood, vaginal mucus, or urine. Common STDs include: °· Gonorrhea. °· Chlamydia. °· Syphilis. °· HIV and AIDS. °· Genital herpes. °· Hepatitis B and C. °· Trichomonas. °· Human papillomavirus (HPV). °· Pubic lice. °· Scabies. °· Mites. °· Bacterial vaginosis. °WHAT ARE CAUSES OF STDs? °An STD may be caused by bacteria, a virus, or parasites. STDs are often transmitted during sexual activity if one person is infected. However, they may also be transmitted through nonsexual means. STDs may be transmitted after:  °· Sexual intercourse with an infected person. °· Sharing sex toys with an infected person. °· Sharing needles with an infected person or using unclean piercing or tattoo needles. °· Having intimate contact with the genitals, mouth, or rectal areas of an infected person. °· Exposure to infected fluids during birth. °WHAT ARE THE SIGNS AND SYMPTOMS OF STDs? °Different STDs have different symptoms. Some people may not have any symptoms. If symptoms are present, they may include: °· Painful or bloody urination. °· Pain in the pelvis, abdomen, vagina, anus, throat, or eyes. °· A skin rash, itching, or irritation. °· Growths, ulcerations, blisters, or sores in the genital and anal areas. °· Abnormal vaginal discharge with or without bad odor. °· Penile discharge in men. °· Fever. °· Pain or bleeding during sexual intercourse. °· Swollen glands in the groin area. °· Yellow skin and eyes (jaundice). This is seen with hepatitis. °· Swollen testicles. °· Infertility. °· Sores and blisters in the mouth. °HOW ARE STDs DIAGNOSED? °To make a diagnosis, your health care provider may: °· Take a medical history. °· Perform a physical exam. °· Take a sample of any discharge to examine. °· Swab the throat,  cervix, opening to the penis, rectum, or vagina for testing. °· Test a sample of your first morning urine. °· Perform blood tests. °· Perform a Pap test, if this applies. °· Perform a colposcopy. °· Perform a laparoscopy. °HOW ARE STDs TREATED? °Treatment depends on the STD. Some STDs may be treated but not cured. °· Chlamydia, gonorrhea, trichomonas, and syphilis can be cured with antibiotic medicine. °· Genital herpes, hepatitis, and HIV can be treated, but not cured, with prescribed medicines. The medicines lessen symptoms. °· Genital warts from HPV can be treated with medicine or by freezing, burning (electrocautery), or surgery. Warts may come back. °· HPV cannot be cured with medicine or surgery. However, abnormal areas may be removed from the cervix, vagina, or vulva. °· If your diagnosis is confirmed, your recent sexual partners need treatment. This is true even if they are symptom-free or have a negative culture or evaluation. They should not have sex until their health care providers say it is okay. °· Your health care provider may test you for infection again 3 months after treatment. °HOW CAN I REDUCE MY RISK OF GETTING AN STD? °Take these steps to reduce your risk of getting an STD: °· Use latex condoms, dental dams, and water-soluble lubricants during sexual activity. Do not use petroleum jelly or oils. °· Avoid having multiple sex partners. °· Do not have sex with someone who has other sex partners °· Do not have sex with anyone you do not know or who is at high risk for an STD. °· Avoid risky sex practices that can break your skin. °· Do not have sex   if you have open sores on your mouth or skin. °· Avoid drinking too much alcohol or taking illegal drugs. Alcohol and drugs can affect your judgment and put you in a vulnerable position. °· Avoid engaging in oral and anal sex acts. °· Get vaccinated for HPV and hepatitis. If you have not received these vaccines in the past, talk to your health care  provider about whether one or both might be right for you. °· If you are at risk of being infected with HIV, it is recommended that you take a prescription medicine daily to prevent HIV infection. This is called pre-exposure prophylaxis (PrEP). You are considered at risk if: °¨ You are a man who has sex with other men (MSM). °¨ You are a heterosexual man or woman and are sexually active with more than one partner. °¨ You take drugs by injection. °¨ You are sexually active with a partner who has HIV. °· Talk with your health care provider about whether you are at high risk of being infected with HIV. If you choose to begin PrEP, you should first be tested for HIV. You should then be tested every 3 months for as long as you are taking PrEP. °WHAT SHOULD I DO IF I THINK I HAVE AN STD? °· See your health care provider. °· Tell your sexual partner(s). They should be tested and treated for any STDs. °· Do not have sex until your health care provider says it is okay. °WHEN SHOULD I GET IMMEDIATE MEDICAL CARE? °Contact your health care provider right away if:  °· You have severe abdominal pain. °· You are a man and notice swelling or pain in your testicles. °· You are a woman and notice swelling or pain in your vagina. °  °This information is not intended to replace advice given to you by your health care provider. Make sure you discuss any questions you have with your health care provider. °  °Document Released: 10/31/2002 Document Revised: 08/31/2014 Document Reviewed: 02/28/2013 °Elsevier Interactive Patient Education ©2016 [employer] ° °

## 2016-06-04 ENCOUNTER — Telehealth: Payer: Self-pay

## 2016-06-04 LAB — RPR: RPR Ser Ql: NONREACTIVE

## 2016-06-04 LAB — HEPATITIS B SURFACE ANTIGEN: Hepatitis B Surface Ag: NEGATIVE

## 2016-06-04 LAB — HIV ANTIBODY (ROUTINE TESTING W REFLEX): HIV Screen 4th Generation wRfx: NONREACTIVE

## 2016-06-04 NOTE — Telephone Encounter (Signed)
S/w patient and informed of lab results.

## 2016-06-07 LAB — NUSWAB VG+, CANDIDA 6SP
ATOPOBIUM VAGINAE: HIGH {score} — AB
BVAB 2: HIGH Score — AB
CANDIDA ALBICANS, NAA: POSITIVE — AB
CANDIDA GLABRATA, NAA: NEGATIVE
CANDIDA KRUSEI, NAA: NEGATIVE
CANDIDA LUSITANIAE, NAA: NEGATIVE
CANDIDA PARAPSILOSIS, NAA: NEGATIVE
CHLAMYDIA TRACHOMATIS, NAA: NEGATIVE
Candida tropicalis, NAA: NEGATIVE
Megasphaera 1: HIGH Score — AB
NEISSERIA GONORRHOEAE, NAA: NEGATIVE
Trich vag by NAA: NEGATIVE

## 2016-06-09 ENCOUNTER — Telehealth: Payer: Self-pay

## 2016-06-09 DIAGNOSIS — N760 Acute vaginitis: Principal | ICD-10-CM

## 2016-06-09 DIAGNOSIS — B9689 Other specified bacterial agents as the cause of diseases classified elsewhere: Secondary | ICD-10-CM

## 2016-06-09 DIAGNOSIS — B379 Candidiasis, unspecified: Secondary | ICD-10-CM

## 2016-06-09 MED ORDER — FLUCONAZOLE 150 MG PO TABS: ORAL_TABLET | ORAL | 0 refills | Status: DC

## 2016-06-09 MED ORDER — METRONIDAZOLE 500 MG PO TABS: 500.0000 mg | ORAL_TABLET | Freq: Two times a day (BID) | ORAL | 0 refills | Status: AC

## 2016-06-09 NOTE — Telephone Encounter (Signed)
S/w pt and informed of results, and prescriptions sent to pharmacy.

## 2016-09-15 ENCOUNTER — Telehealth: Payer: Self-pay | Admitting: *Deleted

## 2016-09-15 NOTE — Telephone Encounter (Signed)
Pt called to office asking for medication refill.  Return call to pt. Pt states that her cycle pain is increasing. Pt states her cycle in December was tolerable but cycle this month is very painful.   Pt states that she has heavy bleeding for 8 days and very painful.  Pt states that she has been given Ibuprofen and a Narcotic in the past to help with cycle pain. Pt states that it has been quite some time since she last needed pain medication.  Pt would like to know if she could get medication now for pain.  Pt advised that message would be sent to provider about refills. Pt made aware unsure if Narcotic would be prescribed due to tighter regulations. Pt made aware will receive return call regarding Rx once provider gives recommendations.    Pleased advise on medication.

## 2016-09-16 ENCOUNTER — Other Ambulatory Visit: Payer: Self-pay | Admitting: Obstetrics

## 2016-09-16 ENCOUNTER — Ambulatory Visit (INDEPENDENT_AMBULATORY_CARE_PROVIDER_SITE_OTHER): Payer: Medicaid Other | Admitting: *Deleted

## 2016-09-16 VITALS — BP 119/86 | HR 85 | Wt 188.0 lb

## 2016-09-16 DIAGNOSIS — Z30013 Encounter for initial prescription of injectable contraceptive: Secondary | ICD-10-CM | POA: Diagnosis not present

## 2016-09-16 DIAGNOSIS — Z3042 Encounter for surveillance of injectable contraceptive: Secondary | ICD-10-CM | POA: Diagnosis not present

## 2016-09-16 DIAGNOSIS — N946 Dysmenorrhea, unspecified: Secondary | ICD-10-CM

## 2016-09-16 DIAGNOSIS — Z3202 Encounter for pregnancy test, result negative: Secondary | ICD-10-CM | POA: Diagnosis not present

## 2016-09-16 LAB — POCT URINE PREGNANCY: Preg Test, Ur: NEGATIVE

## 2016-09-16 MED ORDER — IBUPROFEN 800 MG PO TABS: 800.0000 mg | ORAL_TABLET | Freq: Three times a day (TID) | ORAL | 5 refills | Status: DC | PRN

## 2016-09-16 MED ORDER — MEDROXYPROGESTERONE ACETATE 150 MG/ML IM SUSP: INTRAMUSCULAR | 3 refills | Status: DC

## 2016-09-16 MED ORDER — OXYCODONE-ACETAMINOPHEN 10-325 MG PO TABS: 1.0000 | ORAL_TABLET | ORAL | 0 refills | Status: DC | PRN

## 2016-09-16 MED ORDER — MEDROXYPROGESTERONE ACETATE 150 MG/ML IM SUSP
150.0000 mg | Freq: Once | INTRAMUSCULAR | Status: AC
  Administered 2016-09-16: 150 mg via INTRAMUSCULAR

## 2016-09-16 NOTE — Progress Notes (Signed)
Patient is in the office for Depo Provera injection for her painful cycle.

## 2016-09-18 ENCOUNTER — Other Ambulatory Visit: Payer: Self-pay | Admitting: Obstetrics

## 2016-12-02 ENCOUNTER — Other Ambulatory Visit (HOSPITAL_COMMUNITY)
Admission: RE | Admit: 2016-12-02 | Discharge: 2016-12-02 | Disposition: A | Discharge status: Home / Self Care | Payer: Medicaid Other | Source: Ambulatory Visit | Attending: Obstetrics | Admitting: Obstetrics

## 2016-12-02 ENCOUNTER — Ambulatory Visit (INDEPENDENT_AMBULATORY_CARE_PROVIDER_SITE_OTHER): Payer: Medicaid Other | Admitting: Obstetrics

## 2016-12-02 ENCOUNTER — Encounter: Payer: Self-pay | Admitting: Obstetrics

## 2016-12-02 DIAGNOSIS — N760 Acute vaginitis: Secondary | ICD-10-CM

## 2016-12-02 DIAGNOSIS — B373 Candidiasis of vulva and vagina: Secondary | ICD-10-CM | POA: Diagnosis not present

## 2016-12-02 DIAGNOSIS — A5901 Trichomonal vulvovaginitis: Secondary | ICD-10-CM | POA: Diagnosis not present

## 2016-12-02 DIAGNOSIS — B9689 Other specified bacterial agents as the cause of diseases classified elsewhere: Secondary | ICD-10-CM | POA: Diagnosis not present

## 2016-12-02 DIAGNOSIS — N898 Other specified noninflammatory disorders of vagina: Secondary | ICD-10-CM | POA: Diagnosis not present

## 2016-12-02 DIAGNOSIS — B3731 Acute candidiasis of vulva and vagina: Secondary | ICD-10-CM

## 2016-12-02 MED ORDER — METRONIDAZOLE 0.75 % VA GEL: 1.0000 | Freq: Two times a day (BID) | VAGINAL | 2 refills | Status: DC

## 2016-12-02 MED ORDER — FLUCONAZOLE 200 MG PO TABS: 200.0000 mg | ORAL_TABLET | ORAL | 2 refills | Status: DC

## 2016-12-02 NOTE — Progress Notes (Addendum)
Patient ID: Hannah Fleming, female   DOB: 1981-08-04, 36 y.o.   MRN: [id number]  Chief Complaint  Patient presents with  . Vaginal Discharge    Vaginal discharge, odor, and itching   . Vaginal Itching    HPI Hannah Fleming is a 36 y.o. female.  Yellowish discharge with odor and itching.  Recently treated for BV and yeast. HPI  Past Medical History:  Diagnosis Date  . No pertinent past medical history     Past Surgical History:  Procedure Laterality Date  . DILATION AND CURETTAGE OF UTERUS      Family History  Problem Relation Age of Onset  . Other Neg Hx     Social History Social History  Substance Use Topics  . Smoking status: Former Smoker    Types: Cigarettes    Quit date: 02/07/2009  . Smokeless tobacco: Never Used  . Alcohol use 0.0 oz/week     Comment: occasional    Allergies  Allergen Reactions  . Latex Itching and Rash    Current Outpatient Prescriptions  Medication Sig Dispense Refill  . ibuprofen (ADVIL,MOTRIN) 800 MG tablet Take 1 tablet (800 mg total) by mouth every 8 (eight) hours as needed. 30 tablet 5  . medroxyPROGESTERone (DEPO-PROVERA) 150 MG/ML injection INJECT 1 ML (150 MG TOTAL) INTO THE MUSCLE EVERY 3 (THREE) MONTHS. 1 mL 3  . oxyCODONE-acetaminophen (PERCOCET) 10-325 MG tablet Take 1 tablet by mouth every 4 (four) hours as needed for pain. 30 tablet 0  . valACYclovir (VALTREX) 500 MG tablet Take 1 tablet (500 mg total) by mouth 2 (two) times daily. (Patient to use 1000mg  daily for 3 days as needed for outbreak) 30 tablet prn  . clonazePAM (KLONOPIN) 0.5 MG tablet Take 0.5 mg by mouth 2 (two) times daily as needed for anxiety. Reported on 12/12/2015    . DULoxetine (CYMBALTA) 20 MG capsule Take 20 mg by mouth daily.    . fluconazole (DIFLUCAN) 200 MG tablet Take 1 tablet (200 mg total) by mouth every other day. 3 tablet 2  . ibuprofen (ADVIL,MOTRIN) 600 MG tablet Take 1 tablet (600 mg total) by mouth every 6 (six) hours as needed. (Patient  not taking: Reported on 12/12/2015) 30 tablet 0  . medroxyPROGESTERone (DEPO-PROVERA) 150 MG/ML injection Inject 1 mL (150 mg total) into the muscle every 3 (three) months. 1 mL 0  . metroNIDAZOLE (METROGEL VAGINAL) 0.75 % vaginal gel Place 1 Applicatorful vaginally 2 (two) times daily. 70 g 2  . omeprazole (PRILOSEC) 10 MG capsule Take 10 mg by mouth daily.     No current facility-administered medications for this visit.     Review of Systems Review of Systems Constitutional: negative for fatigue and weight loss Respiratory: negative for cough and wheezing Cardiovascular: negative for chest pain, fatigue and palpitations Gastrointestinal: negative for abdominal pain and change in bowel habits Genitourinary:positive for vaginal discharge, odor and itching Integument/breast: negative for nipple discharge Musculoskeletal:negative for myalgias Neurological: negative for gait problems and tremors Behavioral/Psych: negative for abusive relationship, depression Endocrine: negative for temperature intolerance      There were no vitals taken for this visit.  Physical Exam Physical Exam           General:  Alert and no distress Abdomen:  normal findings: no organomegaly, soft, non-tender and no hernia  Pelvis:  External genitalia: normal general appearance Urinary system: urethral meatus normal and bladder without fullness, nontender Vaginal: normal without tenderness, induration or masses.  Pearline Cables, thin vaginal discharge  Cervix: normal appearance Adnexa: normal bimanual exam Uterus: anteverted and non-tender, normal size    50% of 15 min visit spent on counseling and coordination of care.    Data Reviewed Wet prep and cultures  Assessment     Vaginal discharge with odor and itching     Plan    Wet prep and cultures sent MetroGel and Diflucan Rx for presumptive BV and candida F/U in 3 months for Annual or prn  No orders of the defined types were placed in this encounter.  Meds  ordered this encounter  Medications  . metroNIDAZOLE (METROGEL VAGINAL) 0.75 % vaginal gel    Sig: Place 1 Applicatorful vaginally 2 (two) times daily.    Dispense:  70 g    Refill:  2  . fluconazole (DIFLUCAN) 200 MG tablet    Sig: Take 1 tablet (200 mg total) by mouth every other day.    Dispense:  3 tablet    Refill:  2

## 2016-12-02 NOTE — Progress Notes (Signed)
Patient presents for vaginal yellow discharge, odor, nausea and itching. Pain in her side.  Patient is currently on DEPO.

## 2016-12-03 LAB — CERVICOVAGINAL ANCILLARY ONLY
BACTERIAL VAGINITIS: POSITIVE — AB
CHLAMYDIA, DNA PROBE: NEGATIVE
Candida vaginitis: NEGATIVE
NEISSERIA GONORRHEA: NEGATIVE
Trichomonas: POSITIVE — AB

## 2016-12-04 ENCOUNTER — Other Ambulatory Visit: Payer: Self-pay | Admitting: Obstetrics

## 2016-12-04 DIAGNOSIS — A5901 Trichomonal vulvovaginitis: Secondary | ICD-10-CM

## 2016-12-04 MED ORDER — TINIDAZOLE 500 MG PO TABS: 2.0000 g | ORAL_TABLET | Freq: Once | ORAL | 0 refills | Status: AC

## 2016-12-14 ENCOUNTER — Ambulatory Visit: Payer: Self-pay | Admitting: Obstetrics

## 2016-12-21 ENCOUNTER — Other Ambulatory Visit (HOSPITAL_COMMUNITY)
Admission: RE | Admit: 2016-12-21 | Discharge: 2016-12-21 | Disposition: A | Discharge status: Home / Self Care | Payer: Medicaid Other | Source: Ambulatory Visit | Attending: Obstetrics | Admitting: Obstetrics

## 2016-12-21 ENCOUNTER — Ambulatory Visit (INDEPENDENT_AMBULATORY_CARE_PROVIDER_SITE_OTHER): Payer: Medicaid Other | Admitting: Obstetrics

## 2016-12-21 ENCOUNTER — Encounter: Payer: Self-pay | Admitting: Obstetrics

## 2016-12-21 VITALS — BP 106/75 | HR 89 | Ht 64.0 in | Wt 188.6 lb

## 2016-12-21 DIAGNOSIS — Z0000 Encounter for general adult medical examination without abnormal findings: Secondary | ICD-10-CM

## 2016-12-21 DIAGNOSIS — Z3042 Encounter for surveillance of injectable contraceptive: Secondary | ICD-10-CM

## 2016-12-21 DIAGNOSIS — Z202 Contact with and (suspected) exposure to infections with a predominantly sexual mode of transmission: Secondary | ICD-10-CM | POA: Insufficient documentation

## 2016-12-21 DIAGNOSIS — Z01419 Encounter for gynecological examination (general) (routine) without abnormal findings: Secondary | ICD-10-CM | POA: Insufficient documentation

## 2016-12-21 DIAGNOSIS — Z124 Encounter for screening for malignant neoplasm of cervix: Secondary | ICD-10-CM

## 2016-12-21 DIAGNOSIS — Z3009 Encounter for other general counseling and advice on contraception: Secondary | ICD-10-CM

## 2016-12-21 MED ORDER — MEDROXYPROGESTERONE ACETATE 150 MG/ML IM SUSP
150.0000 mg | Freq: Once | INTRAMUSCULAR | Status: AC
  Administered 2016-12-21: 150 mg via INTRAMUSCULAR

## 2016-12-21 MED ORDER — METRONIDAZOLE 500 MG PO TABS: 2000.0000 mg | ORAL_TABLET | Freq: Once | ORAL | 0 refills | Status: AC

## 2016-12-21 NOTE — Progress Notes (Signed)
Needs refill on prilosec, clonazepam, duloxetine

## 2016-12-21 NOTE — Progress Notes (Signed)
Subjective:        Hannah Fleming is a 36 y.o. female here for a routine exam.  Current complaints: Recently treated for trichomonas contact.  Partner also treated..    Personal health questionnaire:  Is patient Hannah Fleming, have a family history of breast and/or ovarian cancer: no Is there a family history of uterine cancer diagnosed at age < 51, gastrointestinal cancer, urinary tract cancer, family member who is a Field seismologist syndrome-associated carrier: no Is the patient overweight and hypertensive, family history of diabetes, personal history of gestational diabetes, preeclampsia or PCOS: no Is patient over 29, have PCOS,  family history of premature CHD under age 80, diabetes, smoke, have hypertension or peripheral artery disease:  no At any time, has a partner hit, kicked or otherwise hurt or frightened you?: no Over the past 2 weeks, have you felt down, depressed or hopeless?: no Over the past 2 weeks, have you felt little interest or pleasure in doing things?:no   Gynecologic History No LMP recorded. Patient has had an injection. Contraception: Depo-Provera injections Last Pap: 2017. Results were: normal Last mammogram: n/a. Results were: n/a  Obstetric History OB History  Gravida Para Term Preterm AB Living  7 3 3  0 4 3  SAB TAB Ectopic Multiple Live Births  1 3 0 0 3    # Outcome Date GA Lbr Len/2nd Weight Sex Delivery Anes PTL Lv  7 SAB              Birth Comments: System Generated. Please review and update pregnancy details.  6 TAB           5 TAB           4 Term     M Vag-Spont   LIV  3 Term     M Vag-Spont   LIV  2 TAB           1 Term     F Vag-Spont   LIV      Past Medical History:  Diagnosis Date  . No pertinent past medical history     Past Surgical History:  Procedure Laterality Date  . DILATION AND CURETTAGE OF UTERUS       Current Outpatient Prescriptions:  .  ibuprofen (ADVIL,MOTRIN) 600 MG tablet, Take 1 tablet (600 mg total) by mouth  every 6 (six) hours as needed., Disp: 30 tablet, Rfl: 0 .  ibuprofen (ADVIL,MOTRIN) 800 MG tablet, Take 1 tablet (800 mg total) by mouth every 8 (eight) hours as needed., Disp: 30 tablet, Rfl: 5 .  medroxyPROGESTERone (DEPO-PROVERA) 150 MG/ML injection, Inject 1 mL (150 mg total) into the muscle every 3 (three) months., Disp: 1 mL, Rfl: 0 .  medroxyPROGESTERone (DEPO-PROVERA) 150 MG/ML injection, INJECT 1 ML (150 MG TOTAL) INTO THE MUSCLE EVERY 3 (THREE) MONTHS., Disp: 1 mL, Rfl: 3 .  oxyCODONE-acetaminophen (PERCOCET) 10-325 MG tablet, Take 1 tablet by mouth every 4 (four) hours as needed for pain., Disp: 30 tablet, Rfl: 0 .  valACYclovir (VALTREX) 500 MG tablet, Take 1 tablet (500 mg total) by mouth 2 (two) times daily. (Patient to use 1000mg  daily for 3 days as needed for outbreak), Disp: 30 tablet, Rfl: prn .  clonazePAM (KLONOPIN) 0.5 MG tablet, Take 0.5 mg by mouth 2 (two) times daily as needed for anxiety. Reported on 12/12/2015, Disp: , Rfl:  .  DULoxetine (CYMBALTA) 20 MG capsule, Take 20 mg by mouth daily., Disp: , Rfl:  .  metroNIDAZOLE (FLAGYL) 500  MG tablet, Take 4 tablets (2,000 mg total) by mouth once., Disp: 4 tablet, Rfl: 0 .  omeprazole (PRILOSEC) 10 MG capsule, Take 10 mg by mouth daily., Disp: , Rfl:  Allergies  Allergen Reactions  . Latex Itching and Rash    Social History  Substance Use Topics  . Smoking status: Former Smoker    Types: Cigarettes    Quit date: 02/07/2009  . Smokeless tobacco: Never Used  . Alcohol use 0.0 oz/week     Comment: occasional    Family History  Problem Relation Age of Onset  . Other Neg Hx       Review of Systems  Constitutional: negative for fatigue and weight loss Respiratory: negative for cough and wheezing Cardiovascular: negative for chest pain, fatigue and palpitations Gastrointestinal: negative for abdominal pain and change in bowel habits Musculoskeletal:negative for myalgias Neurological: negative for gait problems and  tremors Behavioral/Psych: negative for abusive relationship, depression Endocrine: negative for temperature intolerance    Genitourinary:negative for abnormal menstrual periods, genital lesions, hot flashes, sexual problems and vaginal discharge Integument/breast: negative for breast lump, breast tenderness, nipple discharge and skin lesion(s)    Objective:       BP 106/75   Pulse 89   Ht 5\' 4"  (1.626 m)   Wt 188 lb 9.6 oz (85.5 kg)   BMI 32.37 kg/m  General:   alert  Skin:   no rash or abnormalities  Lungs:   clear to auscultation bilaterally  Heart:   regular rate and rhythm, S1, S2 normal, no murmur, click, rub or gallop  Breasts:   normal without suspicious masses, skin or nipple changes or axillary nodes  Abdomen:  normal findings: no organomegaly, soft, non-tender and no hernia  Pelvis:  External genitalia: normal general appearance Urinary system: urethral meatus normal and bladder without fullness, nontender Vaginal: normal without tenderness, induration or masses Cervix: normal appearance Adnexa: normal bimanual exam Uterus: anteverted and non-tender, normal size   Lab Review Urine pregnancy test Labs reviewed yes Radiologic studies reviewed no  50% of 20 min visit spent on counseling and coordination of care.    Assessment:    Healthy female exam.    Contraceptive Surveillance.  Wants Depo Provera.   Plan:   Depo Provera Rx  Education reviewed: calcium supplements, depression evaluation, low fat, low cholesterol diet, safe sex/STD prevention, self breast exams and weight bearing exercise. Contraception: Depo-Provera injections. Follow up in: 1 year.   Meds ordered this encounter  Medications  . medroxyPROGESTERone (DEPO-PROVERA) injection 150 mg  . metroNIDAZOLE (FLAGYL) 500 MG tablet    Sig: Take 4 tablets (2,000 mg total) by mouth once.    Dispense:  4 tablet    Refill:  0   No orders of the defined types were placed in this encounter.   Patient  ID: Hannah Fleming, female   DOB: 1981/08/13, 36 y.o.   MRN: [id number] Patient ID: Hannah Fleming, female   DOB: Dec 23, 1980, 36 y.o.   MRN: [id number]

## 2016-12-22 LAB — CYTOLOGY - PAP
DIAGNOSIS: NEGATIVE
HPV (WINDOPATH): NOT DETECTED

## 2016-12-22 LAB — CERVICOVAGINAL ANCILLARY ONLY
BACTERIAL VAGINITIS: NEGATIVE
CANDIDA VAGINITIS: NEGATIVE
Chlamydia: NEGATIVE
Neisseria Gonorrhea: NEGATIVE
Trichomonas: NEGATIVE

## 2017-03-05 ENCOUNTER — Emergency Department (HOSPITAL_COMMUNITY)
Admission: EM | Admit: 2017-03-05 | Discharge: 2017-03-05 | Disposition: A | Discharge status: Home / Self Care | Payer: Medicaid Other | Attending: Emergency Medicine | Admitting: Emergency Medicine

## 2017-03-05 ENCOUNTER — Encounter (HOSPITAL_COMMUNITY): Payer: Self-pay | Admitting: Emergency Medicine

## 2017-03-05 DIAGNOSIS — M545 Low back pain, unspecified: Secondary | ICD-10-CM

## 2017-03-05 DIAGNOSIS — Z87891 Personal history of nicotine dependence: Secondary | ICD-10-CM | POA: Insufficient documentation

## 2017-03-05 DIAGNOSIS — Z91040 Latex allergy status: Secondary | ICD-10-CM | POA: Insufficient documentation

## 2017-03-05 LAB — URINALYSIS, ROUTINE W REFLEX MICROSCOPIC
BILIRUBIN URINE: NEGATIVE
Glucose, UA: NEGATIVE mg/dL
Hgb urine dipstick: NEGATIVE
KETONES UR: NEGATIVE mg/dL
LEUKOCYTES UA: NEGATIVE
NITRITE: NEGATIVE
PROTEIN: NEGATIVE mg/dL
Specific Gravity, Urine: 1.025 (ref 1.005–1.030)
pH: 6 (ref 5.0–8.0)

## 2017-03-05 MED ORDER — CYCLOBENZAPRINE HCL 10 MG PO TABS
5.0000 mg | ORAL_TABLET | Freq: Once | ORAL | Status: AC
  Administered 2017-03-05: 5 mg via ORAL
  Filled 2017-03-05: qty 1

## 2017-03-05 MED ORDER — NAPROXEN 500 MG PO TABS: 500.0000 mg | ORAL_TABLET | Freq: Two times a day (BID) | ORAL | 0 refills | Status: DC

## 2017-03-05 MED ORDER — KETOROLAC TROMETHAMINE 30 MG/ML IJ SOLN
30.0000 mg | Freq: Once | INTRAMUSCULAR | Status: AC
  Administered 2017-03-05: 30 mg via INTRAMUSCULAR
  Filled 2017-03-05: qty 1

## 2017-03-05 MED ORDER — CYCLOBENZAPRINE HCL 5 MG PO TABS: 5.0000 mg | ORAL_TABLET | Freq: Two times a day (BID) | ORAL | 0 refills | Status: DC | PRN

## 2017-03-05 NOTE — ED Provider Notes (Signed)
[facility] DEPT Provider Note   CSN: [id number] Arrival date & time: 03/05/17  7371     History   Chief Complaint Chief Complaint  Patient presents with  . Back Pain    HPI Hannah Fleming is a 36 y.o. female.  HPI  This is a 36 year old female with no significant past medical history who presents with left-sided back pain. Patient reports 2 week history of atraumatic left-sided back pain. No noted injury. Denies heavy lifting. Pain is worse with certain movements and walking. She denies any radiation of the pain. Current pain is 6 out of 10. She took an oxycodone several days ago which helped for a limited period of time. She also reports taking ibuprofen with minimal relief. She denies any weakness, numbness, tingling of the lower extremities. She denies any bowel or bladder difficulty. She denies any urinary symptoms or fever.  Past Medical History:  Diagnosis Date  . No pertinent past medical history     Patient Active Problem List   Diagnosis Date Noted  . Trichomonas contact 12/21/2016  . Exposure to sexually transmitted disease (STD) 06/03/2016  . Unspecified disorder of menstruation and other abnormal bleeding from female genital tract 12/28/2013  . Anxiety 02/07/2013  . Dysmenorrhea 02/07/2013    Past Surgical History:  Procedure Laterality Date  . DILATION AND CURETTAGE OF UTERUS      OB History    Gravida Para Term Preterm AB Living   7 3 3  0 4 3   SAB TAB Ectopic Multiple Live Births   1 3 0 0 3       Home Medications    Prior to Admission medications   Medication Sig Start Date End Date Taking? Authorizing Provider  ibuprofen (ADVIL,MOTRIN) 800 MG tablet Take 1 tablet (800 mg total) by mouth every 8 (eight) hours as needed. 09/16/16  Yes Shelly Bombard, MD  medroxyPROGESTERone (DEPO-PROVERA) 150 MG/ML injection INJECT 1 ML (150 MG TOTAL) INTO THE MUSCLE EVERY 3 (THREE) MONTHS. 09/16/16  Yes Shelly Bombard, MD  oxyCODONE-acetaminophen  (PERCOCET) 10-325 MG tablet Take 1 tablet by mouth every 4 (four) hours as needed for pain. 09/16/16  Yes Shelly Bombard, MD  cyclobenzaprine (FLEXERIL) 5 MG tablet Take 1 tablet (5 mg total) by mouth 2 (two) times daily as needed for muscle spasms. 03/05/17   Mayrene Bastarache, Barbette Hair, MD  ibuprofen (ADVIL,MOTRIN) 600 MG tablet Take 1 tablet (600 mg total) by mouth every 6 (six) hours as needed. Patient not taking: Reported on 03/05/2017 05/04/14   Shelly Bombard, MD  medroxyPROGESTERone (DEPO-PROVERA) 150 MG/ML injection Inject 1 mL (150 mg total) into the muscle every 3 (three) months. Patient not taking: Reported on 03/05/2017 04/09/15   Shelly Bombard, MD  naproxen (NAPROSYN) 500 MG tablet Take 1 tablet (500 mg total) by mouth 2 (two) times daily. 03/05/17   Myrikal Messmer, Barbette Hair, MD  valACYclovir (VALTREX) 500 MG tablet Take 1 tablet (500 mg total) by mouth 2 (two) times daily. (Patient to use 1000mg  daily for 3 days as needed for outbreak) Patient not taking: Reported on 03/05/2017 03/16/16   Shelly Bombard, MD    Family History Family History  Problem Relation Age of Onset  . Other Neg Hx     Social History Social History  Substance Use Topics  . Smoking status: Former Smoker    Types: Cigarettes    Quit date: 02/07/2009  . Smokeless tobacco: Never Used  . Alcohol use 0.0 oz/week  Comment: occasional     Allergies   Latex   Review of Systems Review of Systems  Constitutional: Negative for fever.  Genitourinary: Negative for dysuria and hematuria.  Musculoskeletal: Positive for back pain. Negative for gait problem.  Skin: Negative for wound.  Neurological: Negative for weakness, numbness and headaches.  Psychiatric/Behavioral: Negative for confusion.  All other systems reviewed and are negative.    Physical Exam Updated Vital Signs BP 109/78 (BP Location: Right Arm)   Pulse 94   Temp 98.7 F (37.1 C) (Oral)   Resp 16   SpO2 100%   Physical Exam    Constitutional: She is oriented to person, place, and time. She appears well-developed and well-nourished. No distress.  HENT:  Head: Normocephalic and atraumatic.  Cardiovascular: Normal rate, regular rhythm and normal heart sounds.   Pulmonary/Chest: Effort normal and breath sounds normal. No respiratory distress. She has no wheezes.  Abdominal: Soft. Bowel sounds are normal. There is no tenderness. There is no guarding.  Musculoskeletal:  Tenderness to palpation left paraspinous muscle region of the lower thoracic spine, no midline tenderness, step-off, or deformity, no overlying skin changes  Neurological: She is alert and oriented to person, place, and time.  Skin: Skin is warm and dry.  Psychiatric: She has a normal mood and affect.  Nursing note and vitals reviewed.    ED Treatments / Results  Labs (all labs ordered are listed, but only abnormal results are displayed) Labs Reviewed  URINALYSIS, ROUTINE W REFLEX MICROSCOPIC    EKG  EKG Interpretation None       Radiology No results found.  Procedures Procedures (including critical care time)  Medications Ordered in ED Medications  cyclobenzaprine (FLEXERIL) tablet 5 mg (5 mg Oral Given 03/05/17 0702)  ketorolac (TORADOL) 30 MG/ML injection 30 mg (30 mg Intramuscular Given 03/05/17 0702)     Initial Impression / Assessment and Plan / ED Course  I have reviewed the triage vital signs and the nursing notes.  Pertinent labs & imaging results that were available during my care of the patient were reviewed by me and considered in my medical decision making (see chart for details).     Patient presents with back pain. Ongoing for the last 2 weeks. No signs or symptoms of cauda equina. Exam is fairly reassuring. No neurologic deficits. Patient was given anti-inflammatories and muscle relaxers. Urinalysis without evidence of UTI. Suspect musculoskeletal etiology given physical exam and history. Recommend scheduled  anti-inflammatory medications and muscle relaxants. Follow-up with primary recommended.  After history, exam, and medical workup I feel the patient has been appropriately medically screened and is safe for discharge home. Pertinent diagnoses were discussed with the patient. Patient was given return precautions.   Final Clinical Impressions(s) / ED Diagnoses   Final diagnoses:  Acute left-sided low back pain without sciatica    New Prescriptions Discharge Medication List as of 03/05/2017  7:26 AM    START taking these medications   Details  cyclobenzaprine (FLEXERIL) 5 MG tablet Take 1 tablet (5 mg total) by mouth 2 (two) times daily as needed for muscle spasms., Starting Fri 03/05/2017, Print    naproxen (NAPROSYN) 500 MG tablet Take 1 tablet (500 mg total) by mouth 2 (two) times daily., Starting Fri 03/05/2017, Print         Meshach Perry, Barbette Hair, MD 03/07/17 2312

## 2017-03-05 NOTE — ED Triage Notes (Signed)
Pt reports L lumbar back pain ongoing x2 weeks, relieved by RX oxycodone but comes back. NO urinary symptoms. Denies heavy lifting.

## 2017-03-05 NOTE — ED Notes (Signed)
MD Horton at bedside. 

## 2017-03-05 NOTE — Discharge Instructions (Signed)
You were seen today for back pain.  Your UA was negative.  This is likely musculoskeletal pain.  COntinue stretching and see PCP for PT referral.  If you develop weakness, numbness, difficulty with your bowel or bladder you should be re-evaluated.

## 2018-02-23 ENCOUNTER — Encounter (HOSPITAL_COMMUNITY): Payer: Self-pay | Admitting: Emergency Medicine

## 2018-02-23 ENCOUNTER — Emergency Department (HOSPITAL_COMMUNITY)
Admission: EM | Admit: 2018-02-23 | Discharge: 2018-02-24 | Disposition: A | Discharge status: Home / Self Care | Payer: Self-pay | Attending: Emergency Medicine | Admitting: Emergency Medicine

## 2018-02-23 DIAGNOSIS — M545 Low back pain: Secondary | ICD-10-CM | POA: Insufficient documentation

## 2018-02-23 DIAGNOSIS — Z87891 Personal history of nicotine dependence: Secondary | ICD-10-CM | POA: Insufficient documentation

## 2018-02-23 DIAGNOSIS — Z79899 Other long term (current) drug therapy: Secondary | ICD-10-CM | POA: Insufficient documentation

## 2018-02-23 DIAGNOSIS — M79641 Pain in right hand: Secondary | ICD-10-CM | POA: Insufficient documentation

## 2018-02-23 DIAGNOSIS — M546 Pain in thoracic spine: Secondary | ICD-10-CM | POA: Insufficient documentation

## 2018-02-23 DIAGNOSIS — M25531 Pain in right wrist: Secondary | ICD-10-CM | POA: Insufficient documentation

## 2018-02-23 DIAGNOSIS — Z91040 Latex allergy status: Secondary | ICD-10-CM | POA: Insufficient documentation

## 2018-02-23 DIAGNOSIS — R079 Chest pain, unspecified: Secondary | ICD-10-CM | POA: Insufficient documentation

## 2018-02-23 DIAGNOSIS — N946 Dysmenorrhea, unspecified: Secondary | ICD-10-CM
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
## 2018-02-24 ENCOUNTER — Emergency Department (HOSPITAL_COMMUNITY): Payer: Self-pay

## 2018-02-24 LAB — I-STAT BETA HCG BLOOD, ED (MC, WL, AP ONLY): I-stat hCG, quantitative: <5 m[IU]/mL (ref <5)

## 2018-02-24 MED ORDER — IOPAMIDOL (ISOVUE-370) INJECTION 76%
50.0000 mL | Freq: Once | INTRAVENOUS | Status: AC | PRN
  Administered 2018-02-24: 50 mL via INTRAVENOUS

## 2018-02-24 MED ORDER — IBUPROFEN 800 MG PO TABS: 800.0000 mg | ORAL_TABLET | Freq: Three times a day (TID) | ORAL | 0 refills | Status: DC | PRN

## 2018-02-24 MED ORDER — IOPAMIDOL (ISOVUE-370) INJECTION 76%
INTRAVENOUS | Status: AC
  Filled 2018-02-24: qty 50

## 2018-02-24 MED ORDER — CYCLOBENZAPRINE HCL 10 MG PO TABS: 10.0000 mg | ORAL_TABLET | Freq: Two times a day (BID) | ORAL | 0 refills | Status: DC | PRN

## 2018-02-24 NOTE — ED Notes (Signed)
Pt. Return from [facility] and CT via stretcher.

## 2018-03-06 ENCOUNTER — Other Ambulatory Visit: Payer: Self-pay

## 2018-03-06 ENCOUNTER — Emergency Department (HOSPITAL_COMMUNITY)
Admission: EM | Admit: 2018-03-06 | Discharge: 2018-03-06 | Disposition: A | Discharge status: Home / Self Care | Payer: Medicaid Other | Attending: Emergency Medicine | Admitting: Emergency Medicine

## 2018-03-06 DIAGNOSIS — A5901 Trichomonal vulvovaginitis: Secondary | ICD-10-CM | POA: Insufficient documentation

## 2018-03-06 DIAGNOSIS — Z91040 Latex allergy status: Secondary | ICD-10-CM | POA: Insufficient documentation

## 2018-03-06 DIAGNOSIS — Z87891 Personal history of nicotine dependence: Secondary | ICD-10-CM | POA: Insufficient documentation

## 2018-03-06 LAB — WET PREP, GENITAL
Sperm: NONE SEEN
Yeast Wet Prep HPF POC: NONE SEEN

## 2018-03-06 LAB — RPR: RPR Ser Ql: NONREACTIVE

## 2018-03-06 LAB — I-STAT BETA HCG BLOOD, ED (MC, WL, AP ONLY): I-stat hCG, quantitative: <5 m[IU]/mL (ref <5)

## 2018-03-06 LAB — HIV ANTIBODY (ROUTINE TESTING W REFLEX): HIV Screen 4th Generation wRfx: NONREACTIVE

## 2018-03-06 MED ORDER — AZITHROMYCIN 250 MG PO TABS
1000.0000 mg | ORAL_TABLET | Freq: Once | ORAL | Status: AC
  Administered 2018-03-06: 1000 mg via ORAL
  Filled 2018-03-06: qty 4

## 2018-03-06 MED ORDER — LIDOCAINE HCL (PF) 1 % IJ SOLN
INTRAMUSCULAR | Status: AC
  Administered 2018-03-06: 0.9 mL
  Filled 2018-03-06: qty 5

## 2018-03-06 MED ORDER — METRONIDAZOLE 500 MG PO TABS
2000.0000 mg | ORAL_TABLET | Freq: Once | ORAL | Status: AC
  Administered 2018-03-06: 2000 mg via ORAL
  Filled 2018-03-06: qty 4

## 2018-03-06 MED ORDER — CEFTRIAXONE SODIUM 250 MG IJ SOLR
250.0000 mg | Freq: Once | INTRAMUSCULAR | Status: AC
  Administered 2018-03-06: 250 mg via INTRAMUSCULAR
  Filled 2018-03-06: qty 250

## 2018-03-06 NOTE — Discharge Instructions (Signed)
You were given doses of antibiotics to treat the trichomonas infection.  Follow-up with your GYN doctor in a week or so to make sure the symptoms have resolved

## 2018-03-06 NOTE — ED Triage Notes (Signed)
Patient c/o vaginal irritation and discharge; states that it's "itchy" and used OTC Monistat with no relief.

## 2018-03-06 NOTE — ED Provider Notes (Signed)
[facility] EMERGENCY DEPARTMENT Provider Note   CSN: [id number] Arrival date & time: 03/06/18  0550     History   Chief Complaint Chief Complaint  Patient presents with  . Vaginal Discharge    HPI Hannah Fleming is a 37 y.o. female.  HPI Pt had sex with a latex condom 1 week ago.  She then had intercourse without protection a few days ago.  She has had irritation, itching in the vaginal area.  THe area looks red and swollen.  She tried monistat without relief.  She has vaginal discharge, white.  No odor.  Past Medical History:  Diagnosis Date  . No pertinent past medical history     Patient Active Problem List   Diagnosis Date Noted  . Trichomonas contact 12/21/2016  . Exposure to sexually transmitted disease (STD) 06/03/2016  . Unspecified disorder of menstruation and other abnormal bleeding from female genital tract 12/28/2013  . Anxiety 02/07/2013  . Dysmenorrhea 02/07/2013    Past Surgical History:  Procedure Laterality Date  . DILATION AND CURETTAGE OF UTERUS       OB History    Gravida  7   Para  3   Term  3   Preterm  0   AB  4   Living  3     SAB  1   TAB  3   Ectopic  0   Multiple  0   Live Births  3            Home Medications    Prior to Admission medications   Medication Sig Start Date End Date Taking? Authorizing Provider  cyclobenzaprine (FLEXERIL) 10 MG tablet Take 1 tablet (10 mg total) by mouth 2 (two) times daily as needed for muscle spasms. Patient not taking: Reported on 03/06/2018 02/24/18   Montine Circle, PA-C  ibuprofen (ADVIL,MOTRIN) 800 MG tablet Take 1 tablet (800 mg total) by mouth every 8 (eight) hours as needed. Patient not taking: Reported on 03/06/2018 02/24/18   Montine Circle, PA-C  medroxyPROGESTERone (DEPO-PROVERA) 150 MG/ML injection Inject 1 mL (150 mg total) into the muscle every 3 (three) months. Patient not taking: Reported on 03/05/2017 04/09/15   Shelly Bombard, MD    medroxyPROGESTERone (DEPO-PROVERA) 150 MG/ML injection INJECT 1 ML (150 MG TOTAL) INTO THE MUSCLE EVERY 3 (THREE) MONTHS. Patient not taking: Reported on 03/06/2018 09/16/16   Shelly Bombard, MD  valACYclovir (VALTREX) 500 MG tablet Take 1 tablet (500 mg total) by mouth 2 (two) times daily. (Patient to use 1000mg  daily for 3 days as needed for outbreak) Patient not taking: Reported on 03/05/2017 03/16/16   Shelly Bombard, MD    Family History Family History  Problem Relation Age of Onset  . Other Neg Hx     Social History Social History   Tobacco Use  . Smoking status: Former Smoker    Types: Cigarettes    Last attempt to quit: 02/07/2009    Years since quitting: 9.0  . Smokeless tobacco: Never Used  Substance Use Topics  . Alcohol use: Yes    Alcohol/week: 0.0 oz    Comment: occasional  . Drug use: No     Allergies   Latex   Review of Systems Review of Systems  All other systems reviewed and are negative.    Physical Exam Updated Vital Signs BP 118/82   Pulse 76   Temp 97.9 F (36.6 C) (Oral)   Resp 18   Ht  1.626 m (5\' 4" )   Wt 85.7 kg (189 lb)   LMP 01/29/2018 (Approximate)   SpO2 99%   BMI 32.44 kg/m   Physical Exam  Constitutional: She appears well-developed and well-nourished. No distress.  HENT:  Head: Normocephalic and atraumatic.  Right Ear: External ear normal.  Left Ear: External ear normal.  Eyes: Conjunctivae are normal. Right eye exhibits no discharge. Left eye exhibits no discharge. No scleral icterus.  Neck: Neck supple. No tracheal deviation present.  Cardiovascular: Normal rate.  Pulmonary/Chest: Effort normal. No stridor. No respiratory distress.  Abdominal: She exhibits no distension.  Genitourinary: Uterus normal. Pelvic exam was performed with patient supine. There is no rash, tenderness, lesion or injury on the right labia. There is no rash, tenderness, lesion or injury on the left labia. Uterus is not tender. Cervix exhibits no  motion tenderness, no discharge and no friability. Right adnexum displays no mass, no tenderness and no fullness. Left adnexum displays no mass, no tenderness and no fullness. No erythema or tenderness in the vagina. No foreign body in the vagina. No signs of injury around the vagina. Vaginal discharge (white, yellow, thick) found.  Musculoskeletal: She exhibits no edema.  Neurological: She is alert. Cranial nerve deficit: no gross deficits.  Skin: Skin is warm and dry. No rash noted.  Psychiatric: She has a normal mood and affect.  Nursing note and vitals reviewed.    ED Treatments / Results  Labs (all labs ordered are listed, but only abnormal results are displayed) Labs Reviewed  WET PREP, GENITAL - Abnormal; Notable for the following components:      Result Value   Trich, Wet Prep PRESENT (*)    Clue Cells Wet Prep HPF POC PRESENT (*)    WBC, Wet Prep HPF POC MANY (*)    All other components within normal limits  RPR  HIV ANTIBODY (ROUTINE TESTING)  I-STAT BETA HCG BLOOD, ED ([facility], [facility], [facility] ONLY)  GC/CHLAMYDIA PROBE AMP ([place]) NOT AT [facility]     Procedures Procedures (including critical care time)  Medications Ordered in ED Medications  cefTRIAXone (ROCEPHIN) injection 250 mg (has no administration in time range)  azithromycin (ZITHROMAX) tablet 1,000 mg (has no administration in time range)  metroNIDAZOLE (FLAGYL) tablet 2,000 mg (has no administration in time range)     Initial Impression / Assessment and Plan / ED Course  I have reviewed the triage vital signs and the nursing notes.  Pertinent labs & imaging results that were available during my care of the patient were reviewed by me and considered in my medical decision making (see chart for details).    Patient is ED work-up is notable for trichomonas on wet prep.  Patient has no abdominal pain.  No fevers.  No signs of PID.  Patient will be empirically treated to cover gonorrhea and chlamydia.  She was also given  a dose of Flagyl here in the emergency room for treatment of her trichomonas.  Discussed outpatient follow-up with her GYN doctor.  Final Clinical Impressions(s) / ED Diagnoses   Final diagnoses:  Trichomonal vaginitis    ED Discharge Orders    None       Dorie Rank, MD 03/06/18 1021

## 2018-03-07 LAB — GC/CHLAMYDIA PROBE AMP (~~LOC~~) NOT AT ARMC
Chlamydia: NEGATIVE
Neisseria Gonorrhea: NEGATIVE

## 2018-03-21 ENCOUNTER — Encounter: Payer: Self-pay | Admitting: Obstetrics

## 2018-03-21 ENCOUNTER — Other Ambulatory Visit (HOSPITAL_COMMUNITY)
Admission: RE | Admit: 2018-03-21 | Discharge: 2018-03-21 | Disposition: A | Discharge status: Home / Self Care | Payer: Medicaid Other | Source: Ambulatory Visit | Attending: Obstetrics | Admitting: Obstetrics

## 2018-03-21 ENCOUNTER — Ambulatory Visit (INDEPENDENT_AMBULATORY_CARE_PROVIDER_SITE_OTHER): Payer: Medicaid Other | Admitting: Obstetrics

## 2018-03-21 VITALS — BP 121/82 | HR 88 | Ht 64.0 in | Wt 185.6 lb

## 2018-03-21 DIAGNOSIS — Z3009 Encounter for other general counseling and advice on contraception: Secondary | ICD-10-CM

## 2018-03-21 DIAGNOSIS — Z0000 Encounter for general adult medical examination without abnormal findings: Secondary | ICD-10-CM | POA: Diagnosis not present

## 2018-03-21 DIAGNOSIS — Z01419 Encounter for gynecological examination (general) (routine) without abnormal findings: Secondary | ICD-10-CM | POA: Diagnosis not present

## 2018-03-21 DIAGNOSIS — Z3202 Encounter for pregnancy test, result negative: Secondary | ICD-10-CM | POA: Diagnosis not present

## 2018-03-21 DIAGNOSIS — N912 Amenorrhea, unspecified: Secondary | ICD-10-CM

## 2018-03-21 DIAGNOSIS — Z1151 Encounter for screening for human papillomavirus (HPV): Secondary | ICD-10-CM | POA: Diagnosis not present

## 2018-03-21 DIAGNOSIS — Z202 Contact with and (suspected) exposure to infections with a predominantly sexual mode of transmission: Secondary | ICD-10-CM

## 2018-03-21 LAB — POCT URINE PREGNANCY: Preg Test, Ur: NEGATIVE
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
## 2018-03-23 LAB — CYTOLOGY - PAP
Diagnosis: NEGATIVE
HPV: NOT DETECTED

## 2018-08-11 DIAGNOSIS — Z0000 Encounter for general adult medical examination without abnormal findings: Secondary | ICD-10-CM | POA: Diagnosis not present

## 2018-08-11 DIAGNOSIS — Z13220 Encounter for screening for lipoid disorders: Secondary | ICD-10-CM | POA: Diagnosis not present

## 2018-08-11 DIAGNOSIS — Z202 Contact with and (suspected) exposure to infections with a predominantly sexual mode of transmission: Secondary | ICD-10-CM | POA: Diagnosis not present

## 2018-08-11 DIAGNOSIS — Z13228 Encounter for screening for other metabolic disorders: Secondary | ICD-10-CM | POA: Diagnosis not present

## 2018-08-11 DIAGNOSIS — Z113 Encounter for screening for infections with a predominantly sexual mode of transmission: Secondary | ICD-10-CM | POA: Diagnosis not present

## 2018-08-24 HISTORY — PX: DILATION AND CURETTAGE OF UTERUS: SHX78

## 2018-12-18 ENCOUNTER — Other Ambulatory Visit: Payer: Self-pay

## 2018-12-18 ENCOUNTER — Encounter (HOSPITAL_COMMUNITY): Payer: Self-pay

## 2018-12-18 ENCOUNTER — Inpatient Hospital Stay (HOSPITAL_COMMUNITY)
Admission: EM | Admit: 2018-12-18 | Discharge: 2018-12-19 | Disposition: A | Discharge status: Home / Self Care | Payer: Medicaid Other | Attending: Obstetrics & Gynecology | Admitting: Obstetrics & Gynecology

## 2018-12-18 DIAGNOSIS — O26891 Other specified pregnancy related conditions, first trimester: Secondary | ICD-10-CM | POA: Insufficient documentation

## 2018-12-18 DIAGNOSIS — O418X10 Other specified disorders of amniotic fluid and membranes, first trimester, not applicable or unspecified: Secondary | ICD-10-CM | POA: Insufficient documentation

## 2018-12-18 DIAGNOSIS — B9689 Other specified bacterial agents as the cause of diseases classified elsewhere: Secondary | ICD-10-CM | POA: Insufficient documentation

## 2018-12-18 DIAGNOSIS — R109 Unspecified abdominal pain: Secondary | ICD-10-CM | POA: Diagnosis not present

## 2018-12-18 DIAGNOSIS — O3680X0 Pregnancy with inconclusive fetal viability, not applicable or unspecified: Secondary | ICD-10-CM | POA: Diagnosis not present

## 2018-12-18 DIAGNOSIS — N760 Acute vaginitis: Secondary | ICD-10-CM | POA: Insufficient documentation

## 2018-12-18 DIAGNOSIS — O208 Other hemorrhage in early pregnancy: Secondary | ICD-10-CM | POA: Diagnosis not present

## 2018-12-18 DIAGNOSIS — O209 Hemorrhage in early pregnancy, unspecified: Secondary | ICD-10-CM

## 2018-12-18 DIAGNOSIS — Z6791 Unspecified blood type, Rh negative: Secondary | ICD-10-CM

## 2018-12-18 DIAGNOSIS — Z3A01 Less than 8 weeks gestation of pregnancy: Secondary | ICD-10-CM | POA: Insufficient documentation

## 2018-12-18 DIAGNOSIS — O468X1 Other antepartum hemorrhage, first trimester: Secondary | ICD-10-CM

## 2018-12-18 LAB — CBC WITH DIFFERENTIAL/PLATELET
Abs Immature Granulocytes: 0.03 10*3/uL (ref 0.00–0.07)
Basophils Absolute: 0 10*3/uL (ref 0.0–0.1)
Basophils Relative: 0 %
Eosinophils Absolute: 0.2 10*3/uL (ref 0.0–0.5)
Eosinophils Relative: 2 %
HCT: 37.9 % (ref 36.0–46.0)
Hemoglobin: 12.6 g/dL (ref 12.0–15.0)
Immature Granulocytes: 0 %
Lymphocytes Relative: 25 %
Lymphs Abs: 3.4 10*3/uL (ref 0.7–4.0)
MCH: 29.6 pg (ref 26.0–34.0)
MCHC: 33.2 g/dL (ref 30.0–36.0)
MCV: 89.2 fL (ref 80.0–100.0)
Monocytes Absolute: 0.9 10*3/uL (ref 0.1–1.0)
Monocytes Relative: 6 %
Neutro Abs: 9 10*3/uL — ABNORMAL HIGH (ref 1.7–7.7)
Neutrophils Relative %: 67 %
Platelets: 279 10*3/uL (ref 150–400)
RBC: 4.25 MIL/uL (ref 3.87–5.11)
RDW: 13.5 % (ref 11.5–15.5)
WBC: 13.6 10*3/uL — ABNORMAL HIGH (ref 4.0–10.5)
nRBC: 0 % (ref 0.0–0.2)

## 2018-12-18 LAB — URINALYSIS, ROUTINE W REFLEX MICROSCOPIC
Bilirubin Urine: NEGATIVE
Glucose, UA: NEGATIVE mg/dL
Hgb urine dipstick: NEGATIVE
Ketones, ur: 5 mg/dL — AB
Leukocytes,Ua: NEGATIVE
Nitrite: NEGATIVE
Protein, ur: NEGATIVE mg/dL
Specific Gravity, Urine: 1.03 (ref 1.005–1.030)
pH: 6 (ref 5.0–8.0)

## 2018-12-18 LAB — POCT PREGNANCY, URINE: Preg Test, Ur: POSITIVE — AB

## 2018-12-18 MED ORDER — RHO D IMMUNE GLOBULIN 1500 UNIT/2ML IJ SOSY
300.0000 ug | PREFILLED_SYRINGE | Freq: Once | INTRAMUSCULAR | Status: AC
  Administered 2018-12-19: 300 ug via INTRAMUSCULAR
  Filled 2018-12-18: qty 2

## 2018-12-18 NOTE — MAU Provider Note (Addendum)
[facility] Provider Note   CSN: [id number] Arrival date & time: 12/18/18  2210    History   Chief Complaint Chief Complaint  Patient presents with   Possible Pregnancy   Abdominal Pain    HPI Hannah Fleming is a 38 y.o. female.[id number] @ [redacted]w[redacted]d here in MAU with light pink spotting/discharge and left sided abdominal pain. The symptoms started yesterday. The pain comes and goes. The pain is sharp at times. Last night the pain was aggravating and it kept her awake.  She has not taken anything for the pain.    No fever, SOB, or recent travel.   Past Medical History:  Diagnosis Date   No pertinent past medical history     Patient Active Problem List   Diagnosis Date Noted   Trichomonas contact 12/21/2016   Exposure to sexually transmitted disease (STD) 06/03/2016   Unspecified disorder of menstruation and other abnormal bleeding from female genital tract 12/28/2013   Anxiety 02/07/2013   Dysmenorrhea 02/07/2013    Past Surgical History:  Procedure Laterality Date   DILATION AND CURETTAGE OF UTERUS     INDUCED ABORTION     WISDOM TOOTH EXTRACTION       OB History    Gravida  8   Para  3   Term  3   Preterm  0   AB  4   Living  3     SAB  1   TAB  3   Ectopic  0   Multiple  0   Live Births  3           Home Medications    Prior to Admission medications   Medication Sig Start Date End Date Taking? Authorizing Provider  cyclobenzaprine (FLEXERIL) 10 MG tablet Take 1 tablet (10 mg total) by mouth 2 (two) times daily as needed for muscle spasms. 02/24/18   Montine Circle, PA-C  metroNIDAZOLE (FLAGYL) 500 MG tablet Take 1 tablet (500 mg total) by mouth 2 (two) times daily for 7 days. 12/19/18 12/26/18  Aakash Hollomon, Anderson Malta I, NP  valACYclovir (VALTREX) 500 MG tablet Take 1 tablet (500 mg total) by mouth 2 (two) times daily. (Patient to use 1000mg  daily for 3 days as needed for outbreak) Patient not taking: Reported on  03/21/2018 03/16/16   Shelly Bombard, MD    Family History Family History  Problem Relation Age of Onset   Other Neg Hx     Social History Social History   Tobacco Use   Smoking status: Former Smoker    Types: Cigarettes    Last attempt to quit: 02/07/2009    Years since quitting: 9.8   Smokeless tobacco: Never Used  Substance Use Topics   Alcohol use: Yes    Alcohol/week: 0.0 standard drinks    Comment: occasional   Drug use: No    Allergies   Latex   Review of Systems Review of Systems  Constitutional: Negative for fever.  Gastrointestinal: Positive for abdominal pain. Negative for constipation, nausea and vomiting.  Genitourinary: Positive for vaginal bleeding.   Physical Exam Updated Vital Signs BP (!) 103/58 (BP Location: Right Arm)    Pulse 82    Temp 98.2 F (36.8 C)    Resp 16    Ht 5\' 4"  (1.626 m)    Wt 88.5 kg    LMP 11/05/2018    SpO2 99%    BMI 33.49 kg/m   Physical Exam Exam conducted with a chaperone  present.  Constitutional:      Appearance: She is well-developed. She is obese. She is not ill-appearing, toxic-appearing or diaphoretic.  Abdominal:     Tenderness: There is generalized abdominal tenderness. There is no rebound.  Genitourinary:    Comments: Wet prep and GC collected without speculum by NP  Musculoskeletal: Normal range of motion.  Skin:    General: Skin is warm.  Neurological:     Mental Status: She is alert.  Psychiatric:        Mood and Affect: Mood normal.    ED Treatments / Results  Labs (all labs ordered are listed, but only abnormal results are displayed) Labs Reviewed  WET PREP, GENITAL - Abnormal; Notable for the following components:      Result Value   Clue Cells Wet Prep HPF POC PRESENT (*)    WBC, Wet Prep HPF POC FEW (*)    All other components within normal limits  URINALYSIS, ROUTINE W REFLEX MICROSCOPIC - Abnormal; Notable for the following components:   APPearance HAZY (*)    Ketones, ur 5 (*)     All other components within normal limits  CBC WITH DIFFERENTIAL/PLATELET - Abnormal; Notable for the following components:   WBC 13.6 (*)    Neutro Abs 9.0 (*)    All other components within normal limits  COMPREHENSIVE METABOLIC PANEL - Abnormal; Notable for the following components:   Sodium 134 (*)    Creatinine, Ser 1.06 (*)    Total Protein 5.8 (*)    Albumin 3.3 (*)    All other components within normal limits  HCG, QUANTITATIVE, PREGNANCY - Abnormal; Notable for the following components:   hCG, Beta Chain, Quant, S 695 (*)    All other components within normal limits  POCT PREGNANCY, URINE - Abnormal; Notable for the following components:   Preg Test, Ur POSITIVE (*)    All other components within normal limits  HIV ANTIBODY (ROUTINE TESTING W REFLEX)  RH IG WORKUP (INCLUDES ABO/RH)  GC/CHLAMYDIA PROBE AMP ([place]) NOT AT [facility]    EKG None  Radiology US Ob Less Than 14 Weeks With Ob Transvaginal  Result Date: 12/19/2018 CLINICAL DATA:  Initial evaluation for acute pelvic cramping, spotting. Early pregnancy. EXAM: OBSTETRIC <14 WK [place] AND TRANSVAGINAL OB US TECHNIQUE: Both transabdominal and transvaginal ultrasound examinations were performed for complete evaluation of the gestation as well as the maternal uterus, adnexal regions, and pelvic cul-de-sac. Transvaginal technique was performed to assess early pregnancy. COMPARISON:  None. FINDINGS: Intrauterine gestational sac: Single Yolk sac:  Negative. Embryo:  Negative. Cardiac Activity: N/A Heart Rate: N/A  bpm MSD: 3.9 mm   5 w   0 d Subchorionic hemorrhage: Small subchorionic hemorrhage without mass effect. Maternal uterus/adnexae: Ovaries normal in appearance bilaterally. Corpus luteal cyst noted on the left. No free fluid within the pelvis. IMPRESSION: 1. Probable early intrauterine gestational sac, but no yolk sac, fetal pole, or cardiac activity yet visualized. Recommend follow-up quantitative B-HCG levels and follow-up  US in 14 days to confirm and assess viability. This recommendation follows SRU consensus guidelines: Diagnostic Criteria for Nonviable Pregnancy Early in the First Trimester. [place] [employer] Med 2013; 810:1751-02. 2. Small perigestational hemorrhage without mass effect. 3. No other acute maternal uterine or adnexal abnormality identified. Electronically Signed   By: Jeannine Boga M.D.   On: 12/19/2018 00:42    Procedures Procedures (including critical care time)  Medications Ordered in ED Medications  rho (d) immune globulin (RHIG/RHOPHYLAC) injection 300  mcg (300 mcg Intramuscular Given 12/19/18 0055)     Initial Impression / Assessment and Plan / ED Course  I have reviewed the triage vital signs and the nursing notes.  Pertinent labs & imaging results that were available during my care of the patient were reviewed by me and considered in my medical decision making (see chart for details).  Discussed subchorionic hemorrhage in detail with the patient. Discussed Wet prep results; RX for flagyl.  Patient to return on Wednesday at 0900 to [facility] for Hcg levels.  Pelvic rest Ectopic precautions Rhogam given today   Final Clinical Impressions(s) / ED Diagnoses   Final diagnoses:  Vaginal bleeding in pregnancy, first trimester  Subchorionic hematoma in first trimester, single or unspecified fetus  Pregnancy of unknown anatomic location  Bacterial vaginosis  Rh negative state in antepartum period, first trimester    ED Discharge Orders         Ordered    Discharge patient     12/19/18 0049    metroNIDAZOLE (FLAGYL) 500 MG tablet  2 times daily     12/19/18 0049

## 2018-12-18 NOTE — MAU Note (Signed)
Had a + HPT on Monday.  Yesterday started having some cramping and having light pink discharge with foul odor.  Also having headaches.  Hasn't taken anything for the pain.  Cramping is mostly on her left side.  Last intercourse on Tuesday.  Wasn't on any birth control.

## 2018-12-18 NOTE — ED Triage Notes (Signed)
Pt reports two positive pregnancy tests this week.  First day of last cycle is March 14th.  Pt reports a discharge pink in color.  No COVID symptoms.

## 2018-12-19 ENCOUNTER — Inpatient Hospital Stay (HOSPITAL_COMMUNITY): Payer: Medicaid Other

## 2018-12-19 DIAGNOSIS — Z3A01 Less than 8 weeks gestation of pregnancy: Secondary | ICD-10-CM | POA: Diagnosis not present

## 2018-12-19 DIAGNOSIS — O208 Other hemorrhage in early pregnancy: Secondary | ICD-10-CM | POA: Diagnosis not present

## 2018-12-19 LAB — GC/CHLAMYDIA PROBE AMP (~~LOC~~) NOT AT ARMC
Chlamydia: NEGATIVE
Neisseria Gonorrhea: NEGATIVE

## 2018-12-19 LAB — COMPREHENSIVE METABOLIC PANEL
ALT: 13 U/L (ref 0–44)
AST: 15 U/L (ref 15–41)
Albumin: 3.3 g/dL — ABNORMAL LOW (ref 3.5–5.0)
Alkaline Phosphatase: 43 U/L (ref 38–126)
Anion gap: 6 (ref 5–15)
BUN: 16 mg/dL (ref 6–20)
CO2: 22 mmol/L (ref 22–32)
Calcium: 9.1 mg/dL (ref 8.9–10.3)
Chloride: 106 mmol/L (ref 98–111)
Creatinine, Ser: 1.06 mg/dL — ABNORMAL HIGH (ref 0.44–1.00)
GFR calc Af Amer: >60 mL/min (ref >60)
GFR calc non Af Amer: >60 mL/min (ref >60)
Glucose, Bld: 95 mg/dL (ref 70–99)
Potassium: 3.5 mmol/L (ref 3.5–5.1)
Sodium: 134 mmol/L — ABNORMAL LOW (ref 135–145)
Total Bilirubin: 0.5 mg/dL (ref 0.3–1.2)
Total Protein: 5.8 g/dL — ABNORMAL LOW (ref 6.5–8.1)

## 2018-12-19 LAB — WET PREP, GENITAL
Sperm: NONE SEEN
Trich, Wet Prep: NONE SEEN
Yeast Wet Prep HPF POC: NONE SEEN

## 2018-12-19 LAB — HIV ANTIBODY (ROUTINE TESTING W REFLEX): HIV Screen 4th Generation wRfx: NONREACTIVE

## 2018-12-19 LAB — HCG, QUANTITATIVE, PREGNANCY: hCG, Beta Chain, Quant, S: 695 m[IU]/mL — ABNORMAL HIGH (ref <5)

## 2018-12-19 MED ORDER — METRONIDAZOLE 500 MG PO TABS: 500.0000 mg | ORAL_TABLET | Freq: Two times a day (BID) | ORAL | 0 refills | Status: AC

## 2018-12-19 NOTE — Discharge Instructions (Signed)
Subchorionic Hematoma  A subchorionic hematoma is a gathering of blood between the outer wall of the embryo (chorion) and the inner wall of the womb (uterus). This condition can cause vaginal bleeding. If they cause little or no vaginal bleeding, early small hematomas usually shrink on their own and do not affect your baby or pregnancy. When bleeding starts later in pregnancy, or if the hematoma is larger or occurs in older pregnant women, the condition may be more serious. Larger hematomas may get bigger, which increases the chances of miscarriage. This condition also increases the risk of:  Premature separation of the placenta from the uterus.  Premature (preterm) labor.  Stillbirth. What are the causes? The exact cause of this condition is not known. It occurs when blood is trapped between the placenta and the uterine wall because the placenta has separated from the original site of implantation. What increases the risk? You are more likely to develop this condition if:  You were treated with fertility medicines.  You conceived through in vitro fertilization (IVF). What are the signs or symptoms? Symptoms of this condition include:  Vaginal spotting or bleeding.  Contractions of the uterus. These cause abdominal pain. Sometimes you may have no symptoms and the bleeding may only be seen when ultrasound images are taken (transvaginal ultrasound). How is this diagnosed? This condition is diagnosed based on a physical exam. This includes a pelvic exam. You may also have other tests, including:  Blood tests.  Urine tests.  Ultrasound of the abdomen. How is this treated? Treatment for this condition can vary. Treatment may include:  Watchful waiting. You will be monitored closely for any changes in bleeding. During this stage: ? The hematoma may be reabsorbed by the body. ? The hematoma may separate the fluid-filled space containing the embryo (gestational sac) from the wall of the  womb (endometrium).  Medicines.  Activity restriction. This may be needed until the bleeding stops. Follow these instructions at home:  Stay on bed rest if told to do so by your health care provider.  Do not lift anything that is heavier than 10 lbs. (4.5 kg) or as told by your health care provider.  Do not use any products that contain nicotine or tobacco, such as cigarettes and e-cigarettes. If you need help quitting, ask your health care provider.  Track and write down the number of pads you use each day and how soaked (saturated) they are.  Do not use tampons.  Keep all follow-up visits as told by your health care provider. This is important. Your health care provider may ask you to have follow-up blood tests or ultrasound tests or both. Contact a health care provider if:  You have any vaginal bleeding.  You have a fever. Get help right away if:  You have severe cramps in your stomach, back, abdomen, or pelvis.  You pass large clots or tissue. Save any tissue for your health care provider to look at.  You have more vaginal bleeding, and you faint or become lightheaded or weak. Summary  A subchorionic hematoma is a gathering of blood between the outer wall of the placenta and the uterus.  This condition can cause vaginal bleeding.  Sometimes you may have no symptoms and the bleeding may only be seen when ultrasound images are taken.  Treatment may include watchful waiting, medicines, or activity restriction. This information is not intended to replace advice given to you by your health care provider. Make sure you discuss any questions you  have with your health care provider. Document Released: 11/25/2006 Document Revised: 10/06/2016 Document Reviewed: 10/06/2016 Elsevier Interactive Patient Education  2019 [place]. Activity Restriction During Pregnancy Your health care provider may recommend specific activity restrictions during pregnancy for a variety of reasons.  Activity restriction may require that you limit activities that require great effort, such as exercise, lifting, or sex. The type of activity restriction will vary for each person, depending on your risk or the problems you are having. Activity restriction may be recommended for a period of time until your baby is delivered. Why are activity restrictions recommended? Activity restriction may be recommended if:  Your placenta is partially or completely covering the opening of your cervix (placenta previa).  There is bleeding between the wall of the uterus and the amniotic sac in the first trimester of pregnancy (subchorionic hemorrhage).  You went into labor too early (preterm labor).  You have a history of miscarriage.  You have a condition that causes high blood pressure during pregnancy (preeclampsia or eclampsia).  You are pregnant with more than one baby.  Your baby is not growing well. What are the risks? The risks depend on your specific restriction. Strict bed rest has the most physical and emotional risks and is no longer routinely recommended. Risks of strict bed rest include:  Loss of muscle conditioning from not moving.  Blood clots.  Social isolation.  Depression.  Loss of income. Talk with your health care team about activity restriction to decide if it is best for you and your baby. Even if you are having problems during your pregnancy, you may be able to continue with normal levels of activity with careful monitoring by your health care team. Follow these instructions at home: If needed, based on your overall health and the health of your baby, your health care provider will decide which type of activity restriction is right for you. Activity restrictions may include:  Not lifting anything heavier than 10 pounds (4.5 kg).  Avoiding activities that take a lot of physical effort.  No lifting or straining.  Resting in a sitting position or lying down for periods of  time during the day. Pelvic rest may be recommended along with activity restrictions. If pelvic rest is recommended, then:  Do not have sex, an orgasm, or use sexual stimulation.  Do not use tampons. Do not douche. Do not put anything into your vagina.  Do not lift anything that is heavier than 10 lb (4.5 kg).  Avoid activities that require a lot of effort.  Avoid any activity in which your pelvic muscles could become strained, such as squatting. Questions to ask your health care provider  Why is my activity being limited?  How will activity restrictions affect my body?  Why is rest helpful for me and my baby?  What activities can I do?  When can I return to normal activities? When should I seek immediate medical care? Seek immediate medical care if you have:  Vaginal bleeding.  Vaginal discharge.  Cramping pain in your lower abdomen.  Regular contractions.  A low, dull backache. Summary  Your health care provider may recommend specific activity restrictions during pregnancy for a variety of reasons.  Activity restriction may require that you limit activities such as exercise, lifting, sex, or any other activity that requires great effort.  Discuss the risks and benefits of activity restriction with your health care team to decide if it is best for you and your baby.  Contact your health  care provider right away if you think you are having contractions, or if you notice vaginal bleeding, discharge, or cramping. This information is not intended to replace advice given to you by your health care provider. Make sure you discuss any questions you have with your health care provider. Document Released: 12/05/2010 Document Revised: 11/30/2017 Document Reviewed: 11/30/2017 Elsevier Interactive Patient Education  2019 [employer].

## 2018-12-20 ENCOUNTER — Inpatient Hospital Stay (HOSPITAL_COMMUNITY)
Admission: AD | Admit: 2018-12-20 | Discharge: 2018-12-20 | Disposition: A | Discharge status: Home / Self Care | Payer: Medicaid Other | Attending: Obstetrics and Gynecology | Admitting: Obstetrics and Gynecology

## 2018-12-20 ENCOUNTER — Encounter (HOSPITAL_COMMUNITY): Payer: Self-pay | Admitting: *Deleted

## 2018-12-20 ENCOUNTER — Other Ambulatory Visit: Payer: Self-pay

## 2018-12-20 DIAGNOSIS — Z3A01 Less than 8 weeks gestation of pregnancy: Secondary | ICD-10-CM | POA: Diagnosis not present

## 2018-12-20 DIAGNOSIS — Z87891 Personal history of nicotine dependence: Secondary | ICD-10-CM | POA: Insufficient documentation

## 2018-12-20 DIAGNOSIS — O200 Threatened abortion: Secondary | ICD-10-CM

## 2018-12-20 DIAGNOSIS — R109 Unspecified abdominal pain: Secondary | ICD-10-CM | POA: Insufficient documentation

## 2018-12-20 DIAGNOSIS — O209 Hemorrhage in early pregnancy, unspecified: Secondary | ICD-10-CM | POA: Diagnosis present

## 2018-12-20 HISTORY — DX: Herpesviral infection of urogenital system, unspecified: A60.00

## 2018-12-20 HISTORY — DX: Trichomoniasis, unspecified: A59.9

## 2018-12-20 HISTORY — DX: Unspecified infectious disease: B99.9

## 2018-12-20 LAB — RH IG WORKUP (INCLUDES ABO/RH)
ABO/RH(D): B NEG
Antibody Screen: NEGATIVE
Gestational Age(Wks): 6
Unit division: 0

## 2018-12-20 LAB — HCG, QUANTITATIVE, PREGNANCY: hCG, Beta Chain, Quant, S: 574 m[IU]/mL — ABNORMAL HIGH (ref <5)

## 2018-12-20 NOTE — Discharge Instructions (Signed)
Threatened Miscarriage  A threatened miscarriage occurs when a woman has vaginal bleeding during the first 20 weeks of pregnancy but the pregnancy has not ended. If you have vaginal bleeding during this time, your health care provider will do tests to make sure you are still pregnant. If the tests show that you are still pregnant and that the developing baby (fetus) inside your uterus is still growing, your condition is considered a threatened miscarriage. A threatened miscarriage does not mean your pregnancy will end, but it does increase the risk of losing your pregnancy (complete miscarriage). What are the causes? The cause of this condition is usually not known. For women who go on to have a complete miscarriage, the most common cause is an abnormal number of chromosomes in the developing baby. Chromosomes are the structures inside cells that hold all of a person's genetic material. What increases the risk? The following lifestyle factors may increase your risk of a miscarriage in early pregnancy:  Smoking.  Drinking excessive amounts of alcohol or caffeine.  Recreational drug use. The following preexisting health conditions may increase your risk of a miscarriage in early pregnancy:  Polycystic ovary syndrome.  Uterine fibroids.  Infections.  Diabetes mellitus. What are the signs or symptoms? Symptoms of this condition include:  Vaginal bleeding.  Mild abdominal pain or cramps. How is this diagnosed? If you have bleeding with or without abdominal pain before 20 weeks of pregnancy, your health care provider will do tests to check whether you are still pregnant. These will include:  Ultrasound. This test uses sound waves to create images of the inside of your uterus. This allows your health care provider to look at your developing baby and other structures, such as your placenta.  Pelvic exam. This is an internal exam of your vagina and cervix.  Measurement of your baby's heart  rate.  Laboratory tests such as blood tests, urine tests, or swabs for infection You may be diagnosed with a threatened miscarriage if:  Ultrasound testing shows that you are still pregnant.  Your babys heart rate is strong.  A pelvic exam shows that the opening between your uterus and your vagina (cervix) is closed.  Blood tests confirm that you are still pregnant. How is this treated? No treatments have been shown to prevent a threatened miscarriage from going on to a complete miscarriage. However, the right home care is important. Follow these instructions at home:  Get plenty of rest.  Do not have sex or use tampons if you have vaginal bleeding.  Do not douche.  Do not smoke or use recreational drugs.  Do not drink alcohol.  Avoid caffeine.  Keep all follow-up prenatal visits as told by your health care provider. This is important. Contact a health care provider if:  You have light vaginal bleeding or spotting while pregnant.  You have abdominal pain or cramping.  You have a fever. Get help right away if:  You have heavy vaginal bleeding.  You have blood clots coming from your vagina.  You pass tissue from your vagina.  You leak fluid, or you have a gush of fluid from your vagina.  You have severe low back pain or abdominal cramps.  You have fever, chills, and severe abdominal pain. Summary  A threatened miscarriage occurs when a woman has vaginal bleeding during the first 20 weeks of pregnancy but the pregnancy has not ended.  The cause of a threatened miscarriage is usually not known.  Symptoms of this condition may  include vaginal bleeding and mild abdominal pain or cramps.  No treatments have been shown to prevent a threatened miscarriage from going on to a complete miscarriage.  Keep all follow-up prenatal visits as told by your health care provider. This is important. This information is not intended to replace advice given to you by your health  care provider. Make sure you discuss any questions you have with your health care provider. Document Released: 08/10/2005 Document Revised: 11/06/2016 Document Reviewed: 11/06/2016 Elsevier Interactive Patient Education  2019 [employer].

## 2018-12-20 NOTE — ED Triage Notes (Signed)
Pt reports being [redacted] weeks pregnant and having vaginal bleeding onset today with changing x2 pads over several hours today, vitals stable, A&O x4

## 2018-12-20 NOTE — MAU Note (Signed)
Sent up from ER.  Was here on Sunday night, spotting. Got up from nap around 1530,  Was having bleeding, no clots. Light cramping- not like it was.

## 2018-12-20 NOTE — MAU Provider Note (Signed)
History     CSN: [id number]  Arrival date and time: 12/20/18 1557   First Provider Initiated Contact with Patient 12/20/18 1712      Chief Complaint  Patient presents with  . Vaginal Bleeding  . Abdominal Pain   Hannah Fleming is a 38 y.o. [phone] at [redacted]w[redacted]d who presents for Vaginal Bleeding and Abdominal Pain.  She reports that she has been having intermittent cramping since Sunday.  She reports today she woke up and noticed blood that saturated her pantyliner.  She denies passing clots or recent sexual activity.  She reports the pain is 3 and "it's just uncomfortable."  She reports that she has not been taking the flagyl and has not yet picked. She states she has "just been in the house laying around and I haven't done anything."     OB History    Gravida  8   Para  3   Term  3   Preterm  0   AB  4   Living  3     SAB  1   TAB  3   Ectopic  0   Multiple  0   Live Births  3           Past Medical History:  Diagnosis Date  . Genital herpes    no outbreak in 16yrs  . Infection    UTI  . No pertinent past medical history   . Trichomonas infection     Past Surgical History:  Procedure Laterality Date  . DILATION AND CURETTAGE OF UTERUS    . INDUCED ABORTION    . WISDOM TOOTH EXTRACTION      Family History  Problem Relation Age of Onset  . Healthy Mother   . Healthy Father   . Other Neg Hx     Social History   Tobacco Use  . Smoking status: Former Smoker    Types: Cigarettes    Last attempt to quit: 02/07/2009    Years since quitting: 9.8  . Smokeless tobacco: Never Used  Substance Use Topics  . Alcohol use: Not Currently    Alcohol/week: 0.0 standard drinks    Comment: occasional  . Drug use: No    Allergies:  Allergies  Allergen Reactions  . Latex Itching and Rash    Medications Prior to Admission  Medication Sig Dispense Refill Last Dose  . cyclobenzaprine (FLEXERIL) 10 MG tablet Take 1 tablet (10 mg total) by mouth 2 (two)  times daily as needed for muscle spasms. 20 tablet 0 More than a month at Unknown time  . metroNIDAZOLE (FLAGYL) 500 MG tablet Take 1 tablet (500 mg total) by mouth 2 (two) times daily for 7 days. 14 tablet 0   . valACYclovir (VALTREX) 500 MG tablet Take 1 tablet (500 mg total) by mouth 2 (two) times daily. (Patient to use 1000mg  daily for 3 days as needed for outbreak) (Patient not taking: Reported on 03/21/2018) 30 tablet prn More than a month at Unknown time    Review of Systems  Constitutional: Negative for chills and fever.  Respiratory: Negative for cough and shortness of breath.   Gastrointestinal: Negative for constipation, diarrhea, nausea and vomiting.  Genitourinary: Positive for vaginal bleeding. Negative for dysuria and vaginal discharge.  Neurological: Negative for dizziness, light-headedness and headaches.   Physical Exam   Blood pressure 112/61, pulse 79, temperature 98 F (36.7 C), temperature source Oral, resp. rate 16, weight 88.1 kg, last menstrual period 11/05/2018, SpO2 99 %.  Physical Exam  Constitutional: She is oriented to person, place, and time. She appears well-developed and well-nourished.  HENT:  Head: Normocephalic and atraumatic.  Eyes: Conjunctivae are normal.  Neck: Normal range of motion.  Cardiovascular: Normal rate.  Respiratory: Effort normal.  GI: Soft.  Genitourinary: Cervix exhibits no motion tenderness, no discharge and no friability.    Vaginal bleeding present.  There is bleeding in the vagina.    Genitourinary Comments: Speculum Exam: -Vaginal Vault: Pink mucosa.  Moderate amt pink blood in vault. -Cervix: Large, Pink, Parous, no lesions, cysts, or polyps.  Appears closed. Scant amt blood noted active bleeding  -Bimanual Exam: Deferred   Musculoskeletal: Normal range of motion.  Neurological: She is alert and oriented to person, place, and time.  Skin: Skin is warm and dry.  Psychiatric: She has a normal mood and affect. Her behavior  is normal.    MAU Course  Procedures Results for orders placed or performed during the hospital encounter of 12/20/18 (from the past 24 hour(s))  hCG, quantitative, pregnancy     Status: Abnormal   Collection Time: 12/20/18  5:28 PM  Result Value Ref Range   hCG, Beta Chain, Quant, S 574 (H) <5 mIU/mL    MDM Pelvic Exam Quant Assessment and Plan  38 year old [id number] at 6.3 weeks  Vaginal Bleeding [place] Rh Negative  -Exam results discussed. -Informed that bleeding could be related to known [facility]. -Will perform Quant now and reassess. -Offered and declined pain medication. -Rhogam received 4/26. Discussed how this will cover any vaginal bleeding for the next 12 weeks. -Results pending.    Follow Up (6:40 PM) Threatened Miscarriage  -Quant returns with decrease noted. -Results discussed with patient. -Condolences given and patient informed most likely having a miscarriage.  -Informed of need to follow up as appropriate with labs. -Scheduled for Dec 27, 2018 at 0900 in [place] office. Visit for 4/29 cancelled. -Bleeding Precautions Given -Questions and concerns addressed regarding history of TAB as reason for difficulties with carrying pregnancy. -Discussed genetics and role in early miscarriage. -No other questions or concerns. -Encouraged to call or return to MAU if symptoms worsen or with the onset of new symptoms. -Discharged to home in stable condition  Maryann Conners MSN, CNM 12/20/2018, 5:12 PM

## 2018-12-20 NOTE — ED Notes (Signed)
Pt to be transported to MAU, transport aware, MAU notified

## 2018-12-21 ENCOUNTER — Ambulatory Visit: Payer: Self-pay

## 2018-12-27 ENCOUNTER — Other Ambulatory Visit: Payer: Self-pay

## 2018-12-27 ENCOUNTER — Ambulatory Visit: Payer: Medicaid Other

## 2018-12-27 DIAGNOSIS — O200 Threatened abortion: Secondary | ICD-10-CM | POA: Diagnosis not present

## 2018-12-27 LAB — BETA HCG QUANT (REF LAB): hCG Quant: 205 m[IU]/mL

## 2018-12-27 NOTE — Progress Notes (Signed)
ATTESTATION OF SUPERVISION OF RN: Evaluation and management procedures were performed by the RN under my supervision and collaboration. I have reviewed the nursing note and chart and agree with the management and plan for this patient.  Samantha Weinhold, CNM  

## 2018-12-27 NOTE — Progress Notes (Signed)
Pt here today for STAT Beta Lab.  Pt denies pain or bleeding today.  Pt informed that we will call her with results and f/u.    Received call for critical beta lab value is 205.  Notified Mallie Snooks, CNM who recommended that the pt continues to come weekly for f/u beta lab.  Notified pt providers recommendation.  Pt stated that she will be able to come in on May 12th @ 0900 for non stat beta lab.  [place] office notified to schedule appt.

## 2019-01-03 ENCOUNTER — Other Ambulatory Visit: Payer: Self-pay

## 2019-01-03 ENCOUNTER — Other Ambulatory Visit: Payer: Medicaid Other

## 2019-01-03 DIAGNOSIS — O039 Complete or unspecified spontaneous abortion without complication: Secondary | ICD-10-CM | POA: Diagnosis not present

## 2019-01-04 LAB — BETA HCG QUANT (REF LAB): hCG Quant: 35 m[IU]/mL

## 2019-01-06 ENCOUNTER — Telehealth: Payer: Self-pay | Admitting: *Deleted

## 2019-01-06 NOTE — Telephone Encounter (Signed)
Kamla left a message yesterday pm stating she has been having weekly blood draws and hasn't heard back her results from Tuesday and wants to know results and if she has to come in next week. . Per review bhcg 35 and per Mallie Snooks ,CNM needs weekly blood draw until normal.  I called Kayleen and left a message I am returning her call and she needs another blood draw and I will have registrars put her on lab schedule Tuesday 9:10 - call us if questions/ issues.

## 2019-01-10 ENCOUNTER — Other Ambulatory Visit: Payer: Medicaid Other

## 2019-01-10 ENCOUNTER — Other Ambulatory Visit: Payer: Self-pay

## 2019-01-10 DIAGNOSIS — O039 Complete or unspecified spontaneous abortion without complication: Secondary | ICD-10-CM

## 2019-01-11 LAB — BETA HCG QUANT (REF LAB): hCG Quant: 7 m[IU]/mL

## 2019-01-12 ENCOUNTER — Telehealth (INDEPENDENT_AMBULATORY_CARE_PROVIDER_SITE_OTHER): Payer: Medicaid Other | Admitting: *Deleted

## 2019-01-12 DIAGNOSIS — O039 Complete or unspecified spontaneous abortion without complication: Secondary | ICD-10-CM

## 2019-01-12 NOTE — Telephone Encounter (Signed)
Called pt regarding her hcg result and informed her that she would no longer need any testing.  Recommended pt not attempt to become pregnant prior to her next period.  Pt verbalized understanding.   Pt states she is having vaginal irritation and that she spoke with a nurse on Tuesday and was told she would be given a medication for yeast.  Pt denies itching, or abnormal vaginal discharge at this time.  Pt states she is spotting from the miscarriage.  Advised pt that she needed to come in for a self swab so we could ensure we are treating her for the correct thing.  Pt verbalized understanding and will come for a nurse visit on Tuesday 5/26 @ 0940.  Message sent to front office staff to schedule.

## 2019-01-12 NOTE — Telephone Encounter (Signed)
-----   Message from Sloan Leiter, MD sent at 01/11/2019  4:36 PM EDT ----- Appropriate decrease after SAB, no need for further followup HCG

## 2019-01-17 ENCOUNTER — Other Ambulatory Visit: Payer: Self-pay

## 2019-01-17 ENCOUNTER — Other Ambulatory Visit (HOSPITAL_COMMUNITY)
Admission: RE | Admit: 2019-01-17 | Discharge: 2019-01-17 | Disposition: A | Discharge status: Home / Self Care | Payer: Medicaid Other | Source: Ambulatory Visit | Attending: Obstetrics | Admitting: Obstetrics

## 2019-01-17 ENCOUNTER — Ambulatory Visit: Payer: Medicaid Other

## 2019-01-17 DIAGNOSIS — N898 Other specified noninflammatory disorders of vagina: Secondary | ICD-10-CM | POA: Insufficient documentation

## 2019-01-17 NOTE — Progress Notes (Signed)
SUBJECTIVE:  38 y.o. female complains of white vaginal discharge with irritation x couple of days. Denies abnormal vaginal bleeding or significant pelvic pain or fever. No UTI symptoms. Denies history of known exposure to STD.  Patient's last menstrual period was 11/05/2018.  Recent SAB 01/10/2019  OBJECTIVE:  She appears well, afebrile. Urine dipstick: not done.  ASSESSMENT:  Vaginal Discharge  Vaginal Odor   PLAN:  GC, chlamydia, trichomonas, BVAG, CVAG probe sent to lab. Treatment: To be determined once lab results are received ROV prn if symptoms persist or worsen. Pt advised to set up MyChart

## 2019-01-17 NOTE — Progress Notes (Signed)
I have reviewed the chart and agree with nursing staff's documentation of this patient's encounter.  Mora Bellman, MD 01/17/2019 1:30 PM

## 2019-01-18 ENCOUNTER — Other Ambulatory Visit: Payer: Self-pay | Admitting: Obstetrics and Gynecology

## 2019-01-18 LAB — CERVICOVAGINAL ANCILLARY ONLY
Bacterial vaginitis: POSITIVE — AB
Candida vaginitis: POSITIVE — AB
Chlamydia: NEGATIVE
Neisseria Gonorrhea: NEGATIVE
Trichomonas: NEGATIVE

## 2019-01-18 MED ORDER — TERCONAZOLE 0.8 % VA CREA: 1.0000 | TOPICAL_CREAM | Freq: Every day | VAGINAL | 0 refills | Status: DC

## 2019-01-18 MED ORDER — METRONIDAZOLE 500 MG PO TABS: 500.0000 mg | ORAL_TABLET | Freq: Two times a day (BID) | ORAL | 0 refills | Status: DC

## 2019-01-19 ENCOUNTER — Telehealth: Payer: Self-pay | Admitting: *Deleted

## 2019-01-19 NOTE — Telephone Encounter (Signed)
-----   Message from Mora Bellman, MD sent at 01/18/2019  4:16 PM EDT ----- Please inform patient of both BV and yeast infection. Rx has been e-prescribed

## 2019-01-19 NOTE — Telephone Encounter (Signed)
Called pt to inform that she tested positive for BV and yeast and that medications for both was prescribed and sent to the [facility] on [address].  Pt verbalized understanding.

## 2019-02-22 ENCOUNTER — Emergency Department (HOSPITAL_COMMUNITY)
Admission: EM | Admit: 2019-02-22 | Discharge: 2019-02-22 | Disposition: A | Discharge status: Home / Self Care | Payer: Medicaid Other | Attending: Emergency Medicine | Admitting: Emergency Medicine

## 2019-02-22 ENCOUNTER — Other Ambulatory Visit: Payer: Self-pay

## 2019-02-22 DIAGNOSIS — Z79899 Other long term (current) drug therapy: Secondary | ICD-10-CM | POA: Diagnosis not present

## 2019-02-22 DIAGNOSIS — L299 Pruritus, unspecified: Secondary | ICD-10-CM

## 2019-02-22 DIAGNOSIS — R000 Tachycardia, unspecified: Secondary | ICD-10-CM | POA: Diagnosis not present

## 2019-02-22 DIAGNOSIS — L509 Urticaria, unspecified: Secondary | ICD-10-CM | POA: Diagnosis present

## 2019-02-22 DIAGNOSIS — Z87891 Personal history of nicotine dependence: Secondary | ICD-10-CM | POA: Insufficient documentation

## 2019-02-22 DIAGNOSIS — T7840XA Allergy, unspecified, initial encounter: Secondary | ICD-10-CM | POA: Diagnosis not present

## 2019-02-22 MED ORDER — FAMOTIDINE IN NACL 20-0.9 MG/50ML-% IV SOLN
20.0000 mg | Freq: Once | INTRAVENOUS | Status: AC
  Administered 2019-02-22: 12:00:00 20 mg via INTRAVENOUS
  Filled 2019-02-22: qty 50

## 2019-02-22 MED ORDER — FAMOTIDINE 20 MG PO TABS: 20.0000 mg | ORAL_TABLET | Freq: Two times a day (BID) | ORAL | 0 refills | Status: DC

## 2019-02-22 NOTE — ED Provider Notes (Signed)
[facility] DEPT Provider Note   CSN: [id number] Arrival date & time: 02/22/19  1054    History   Chief Complaint Chief Complaint  Patient presents with  . Allergic Reaction    HPI Hannah Fleming is a 38 y.o. female.     Hannah Fleming is a 38 y.o. female with a history of anxiety, dysmenorrhea and GERD, who presents to the emergency department for evaluation of an allergic reaction.  She reports that this morning she took brand-name Prilosec, she usually takes generic omeprazole, about 30 minutes after taking the medicine she began to feel itchy, she took her child to daycare, and tried to take some liquid Benadryl but it did not improve so she called EMS, she was given 50 mg of IV Benadryl with EMS and has had some improvement in her itching, she still reports some occasional itching on her legs and back.  She denies any rash.  She has not noted any facial swelling, swelling of the lips or tongue, difficulty breathing or swallowing, chest pain, shortness of breath or wheezing.  She has not had any nausea or vomiting.  She denies any prior history of allergic reactions or anaphylaxis.  No other new medications, new foods or exposures this morning.     Past Medical History:  Diagnosis Date  . Genital herpes    no outbreak in 63yrs  . Infection    UTI  . No pertinent past medical history   . Trichomonas infection     Patient Active Problem List   Diagnosis Date Noted  . Trichomonas contact 12/21/2016  . Exposure to sexually transmitted disease (STD) 06/03/2016  . Unspecified disorder of menstruation and other abnormal bleeding from female genital tract 12/28/2013  . Anxiety 02/07/2013  . Dysmenorrhea 02/07/2013    Past Surgical History:  Procedure Laterality Date  . DILATION AND CURETTAGE OF UTERUS    . INDUCED ABORTION    . WISDOM TOOTH EXTRACTION       OB History    Gravida  8   Para  3   Term  3   Preterm  0   AB  5   Living  3     SAB  1   TAB  3   Ectopic  0   Multiple  0   Live Births  3            Home Medications    Prior to Admission medications   Medication Sig Start Date End Date Taking? Authorizing Provider  Multiple Vitamins-Minerals (MULTIVITAMIN ADULT) TABS Take 1 tablet by mouth daily.   Yes [provider]  omeprazole (PRILOSEC OTC) 20 MG tablet Take 20 mg by mouth daily as needed (heartburn).   Yes [provider]  terconazole (TERAZOL 3) 0.8 % vaginal cream Place 1 applicator vaginally at bedtime. Apply nightly for three nights. 01/18/19  Yes Constant, Peggy, MD  cyclobenzaprine (FLEXERIL) 10 MG tablet Take 1 tablet (10 mg total) by mouth 2 (two) times daily as needed for muscle spasms. Patient not taking: Reported on 02/22/2019 02/24/18   Montine Circle, PA-C  famotidine (PEPCID) 20 MG tablet Take 1 tablet (20 mg total) by mouth 2 (two) times daily. 02/22/19   Jacqlyn Larsen, PA-C  metroNIDAZOLE (FLAGYL) 500 MG tablet Take 1 tablet (500 mg total) by mouth 2 (two) times daily. Patient not taking: Reported on 02/22/2019 01/18/19   Constant, Vickii Chafe, MD    Family History Family History  Problem Relation Age of Onset  . Healthy Mother   . Healthy Father   . Other Neg Hx     Social History Social History   Tobacco Use  . Smoking status: Former Smoker    Types: Cigarettes    Quit date: 02/07/2009    Years since quitting: 10.0  . Smokeless tobacco: Never Used  Substance Use Topics  . Alcohol use: Not Currently    Alcohol/week: 0.0 standard drinks    Comment: occasional  . Drug use: No     Allergies   Latex   Review of Systems Review of Systems  Constitutional: Negative for chills and fever.  HENT: Negative for facial swelling, sore throat and trouble swallowing.   Respiratory: Negative for cough, chest tightness, shortness of breath, wheezing and stridor.   Cardiovascular: Negative for chest pain.  Gastrointestinal: Negative for abdominal pain,  diarrhea, nausea and vomiting.  Musculoskeletal: Negative for arthralgias and myalgias.  Skin: Negative for color change and rash.  Neurological: Negative for dizziness, syncope and light-headedness.  All other systems reviewed and are negative.    Physical Exam Updated Vital Signs BP 126/80   Pulse 78   Temp 97.6 F (36.4 C) (Oral)   Resp 18   Ht 5\' 5"  (1.651 m)   Wt 83.9 kg   LMP 01/23/2019 (Approximate)   SpO2 95%   BMI 30.79 kg/m   Physical Exam Vitals signs and nursing note reviewed.  Constitutional:      General: She is not in acute distress.    Appearance: Normal appearance. She is well-developed and normal weight. She is not diaphoretic.  HENT:     Head: Normocephalic and atraumatic.     Comments: No facial swelling noted.    Nose: Nose normal.     Mouth/Throat:     Mouth: Mucous membranes are moist.     Pharynx: Oropharynx is clear.     Comments: No edema of the lips or tongue, posterior oropharynx is clear with no edema or erythema, tolerating secretions without difficulty, normal phonation, uvula midline. Eyes:     General:        Right eye: No discharge.        Left eye: No discharge.     Pupils: Pupils are equal, round, and reactive to light.  Neck:     Musculoskeletal: Neck supple.     Comments: No stridor, breathing comfortably. Cardiovascular:     Rate and Rhythm: Normal rate and regular rhythm.     Heart sounds: Normal heart sounds. No murmur. No friction rub. No gallop.   Pulmonary:     Effort: Pulmonary effort is normal. No respiratory distress.     Breath sounds: Normal breath sounds. No wheezing or rales.     Comments: Respirations equal and unlabored, patient able to speak in full sentences, lungs clear to auscultation bilaterally Abdominal:     General: Bowel sounds are normal. There is no distension.     Palpations: Abdomen is soft. There is no mass.     Tenderness: There is no abdominal tenderness. There is no guarding.     Comments:  Abdomen soft, nondistended, nontender to palpation in all quadrants without guarding or peritoneal signs  Musculoskeletal:        General: No deformity.  Skin:    General: Skin is warm and dry.     Capillary Refill: Capillary refill takes less than 2 seconds.     Comments: Patient scratching intermittently but there is no rash  noted  Neurological:     Mental Status: She is alert.     Coordination: Coordination normal.     Comments: Speech is clear, able to follow commands Moves extremities without ataxia, coordination intact  Psychiatric:        Mood and Affect: Mood normal.        Behavior: Behavior normal.      ED Treatments / Results  Labs (all labs ordered are listed, but only abnormal results are displayed) Labs Reviewed - No data to display  EKG None  Radiology No results found.  Procedures Procedures (including critical care time)  Medications Ordered in ED Medications  famotidine (PEPCID) IVPB 20 mg premix (20 mg Intravenous New Bag/Given 02/22/19 1138)     Initial Impression / Assessment and Plan / ED Course  I have reviewed the triage vital signs and the nursing notes.  Pertinent labs & imaging results that were available during my care of the patient were reviewed by me and considered in my medical decision making (see chart for details).  Patient presents with pruritus after taking Prilosec, typically takes generic omeprazole.  She has no associated facial swelling, no difficulty breathing or tolerating secretions, no shortness of breath or wheezing.  She has no rash but has had persistent itching, improving after 50 of IV Benadryl.  Suspect mild allergic reaction, no signs of anaphylaxis.  Will add Pepcid which should also help with acid reflux will have patient discontinue Prilosec and omeprazole.  She has been observed here in the ED for greater than 1 hour and her symptoms have completely resolved.  We will have her continue with Pepcid and Benadryl at home.   Return precautions discussed.  Discharged home in good condition.  Final Clinical Impressions(s) / ED Diagnoses   Final diagnoses:  Allergic reaction, initial encounter  Pruritus    ED Discharge Orders         Ordered    famotidine (PEPCID) 20 MG tablet  2 times daily     02/22/19 1250           Janet Berlin 02/22/19 1316    Jola Schmidt, MD 02/22/19 1523

## 2019-02-22 NOTE — ED Triage Notes (Signed)
Per EMS: Pt woke up this am with heartburn, she took Prilosec.  While taking her child to day care she began itching.  Pt took liquid benadryl, which did not help.  Pt given IV 50 mg benadryl.  20 gauge R hand.

## 2019-02-22 NOTE — Discharge Instructions (Addendum)
This was likely a mild allergic reaction to Prilosec, please discontinue Prilosec and omeprazole, you can take Pepcid 20 mg before breakfast and bedtime this will help with itching from your allergic reaction will also help with heartburn you can continue to use this, you can take Benadryl as needed for itching.  Please follow-up with your primary care doctor.  Return to the emergency department for any facial swelling, difficulty breathing or any other new or worsening symptoms.

## 2019-03-17 ENCOUNTER — Telehealth: Payer: Self-pay

## 2019-03-17 NOTE — Telephone Encounter (Signed)

## 2019-03-20 ENCOUNTER — Other Ambulatory Visit: Payer: Self-pay

## 2019-03-20 ENCOUNTER — Ambulatory Visit (INDEPENDENT_AMBULATORY_CARE_PROVIDER_SITE_OTHER): Payer: Medicaid Other | Admitting: Family Medicine

## 2019-03-20 ENCOUNTER — Encounter: Payer: Self-pay | Admitting: Family Medicine

## 2019-03-20 VITALS — BP 121/86 | HR 78 | Temp 97.4°F | Resp 17 | Ht 64.0 in | Wt 191.4 lb

## 2019-03-20 DIAGNOSIS — E010 Iodine-deficiency related diffuse (endemic) goiter: Secondary | ICD-10-CM

## 2019-03-20 DIAGNOSIS — H6981 Other specified disorders of Eustachian tube, right ear: Secondary | ICD-10-CM

## 2019-03-20 DIAGNOSIS — H6991 Unspecified Eustachian tube disorder, right ear: Secondary | ICD-10-CM

## 2019-03-20 DIAGNOSIS — T7840XD Allergy, unspecified, subsequent encounter: Secondary | ICD-10-CM | POA: Diagnosis not present

## 2019-03-20 DIAGNOSIS — R1013 Epigastric pain: Secondary | ICD-10-CM

## 2019-03-20 MED ORDER — FAMOTIDINE 20 MG PO TABS: 20.0000 mg | ORAL_TABLET | Freq: Two times a day (BID) | ORAL | 5 refills | Status: DC

## 2019-03-20 NOTE — Progress Notes (Signed)
Established Patient Office Visit  Subjective:  Patient ID: Hannah Fleming, female    DOB: Mar 20, 1981  Age: 38 y.o. MRN: [id number]  CC:  Chief Complaint  Patient presents with  . Establish Care  . Follow-up    ED 7/1: allergic reaction    HPI Hannah Fleming presents for follow-up of an allergic reaction to name brand Prilosec and complaint of recurrent itching in the right ear and right side of the throat especially at night as well as tonsillar stones.  She is status post emergency department visit on 02/22/2019 after taking brand-name Prilosec OTC and then having onset of generalized itching and sensation of itchy throat.  Patient states that she normally has taken over-the-counter omeprazole for treatment of heartburn without any problems but she ran out of that medication and the store only had the name brand Prilosec.  When she took the name brand Prilosec OTC on 02/22/2019 and then dropped her child to daycare, she started having generalized itching especially on her arms and patient states she took Benadryl but this did not help.  Patient called 911 and EMS gave patient 50 mg of Benadryl patient then went to the emergency department for further evaluation and treatment.  Patient states that the higher dose did seem to decrease her itching.  She reports that she was given Pepcid which she took after her ED visit but has not taken since that time.       Patient with complaint of developing recurrent tonsillar stones in the back of her throat which patient picks out of her tonsils.  She also has complaint of feeling pressure and itchiness in her right ear as well as in the right side of her throat.  She would like to have a referral to ENT for further evaluation.  She does not currently take any medications for allergic rhinitis because she does not feel that her symptoms are related to possible allergy symptoms.  She does admit that she did have some decrease in sensation of itchy throat and  ear pressure when she took the Benadryl for her reaction to Prilosec.      Past Medical History:  Diagnosis Date  . Genital herpes    no outbreak in 17yrs  . Infection    UTI  . No pertinent past medical history   . Trichomonas infection     Past Surgical History:  Procedure Laterality Date  . DILATION AND CURETTAGE OF UTERUS    . INDUCED ABORTION    . WISDOM TOOTH EXTRACTION      Family History  Problem Relation Age of Onset  . Healthy Mother   . Healthy Father   . Other Neg Hx     Social History   Socioeconomic History  . Marital status: Single    Spouse name: Not on file  . Number of children: 3  . Years of education: Not on file  . Highest education level: Not on file  Occupational History  . Not on file  Social Needs  . Financial resource strain: Not on file  . Food insecurity    Worry: Not on file    Inability: Not on file  . Transportation needs    Medical: Not on file    Non-medical: Not on file  Tobacco Use  . Smoking status: Former Smoker    Types: Cigarettes    Quit date: 02/07/2009    Years since quitting: 10.1  . Smokeless tobacco: Never Used  Substance and  Sexual Activity  . Alcohol use: Not Currently    Alcohol/week: 0.0 standard drinks    Comment: occasional  . Drug use: No  . Sexual activity: Yes    Partners: Male    Birth control/protection: None  Lifestyle  . Physical activity    Days per week: Not on file    Minutes per session: Not on file  . Stress: Not on file  Relationships  . Social Herbalist on phone: Not on file    Gets together: Not on file    Attends religious service: Not on file    Active member of club or organization: Not on file    Attends meetings of clubs or organizations: Not on file    Relationship status: Not on file  . Intimate partner violence    Fear of current or ex partner: Not on file    Emotionally abused: Not on file    Physically abused: Not on file    Forced sexual activity: Not on  file  Other Topics Concern  . Not on file  Social History Narrative  . Not on file    Outpatient Medications Prior to Visit  Medication Sig Dispense Refill  . famotidine (PEPCID) 20 MG tablet Take 1 tablet (20 mg total) by mouth 2 (two) times daily. 30 tablet 0  . Multiple Vitamins-Minerals (MULTIVITAMIN ADULT) TABS Take 1 tablet by mouth daily.    . cyclobenzaprine (FLEXERIL) 10 MG tablet Take 1 tablet (10 mg total) by mouth 2 (two) times daily as needed for muscle spasms. (Patient not taking: Reported on 02/22/2019) 20 tablet 0  . metroNIDAZOLE (FLAGYL) 500 MG tablet Take 1 tablet (500 mg total) by mouth 2 (two) times daily. (Patient not taking: Reported on 02/22/2019) 14 tablet 0  . omeprazole (PRILOSEC OTC) 20 MG tablet Take 20 mg by mouth daily as needed (heartburn).    . terconazole (TERAZOL 3) 0.8 % vaginal cream Place 1 applicator vaginally at bedtime. Apply nightly for three nights. 20 g 0   No facility-administered medications prior to visit.     Allergies  Allergen Reactions  . Latex Itching and Rash    ROS Review of Systems  Constitutional: Positive for fatigue (Mild). Negative for chills and fever.  HENT: Positive for ear pain. Negative for congestion, postnasal drip, rhinorrhea, sore throat and trouble swallowing.   Respiratory: Negative for cough and shortness of breath.   Cardiovascular: Negative for chest pain, palpitations and leg swelling.  Gastrointestinal: Negative for abdominal pain, constipation, diarrhea and nausea.  Endocrine: Negative for cold intolerance, heat intolerance, polydipsia, polyphagia and polyuria.  Genitourinary: Negative for dysuria, flank pain and frequency.  Musculoskeletal: Negative for arthralgias and back pain.  Neurological: Negative for dizziness and headaches.  Hematological: Negative for adenopathy. Does not bruise/bleed easily.  Psychiatric/Behavioral: Negative for self-injury and suicidal ideas. The patient is not nervous/anxious.        Objective:    Physical Exam  Constitutional: She is oriented to person, place, and time. She appears well-developed and well-nourished.  HENT:  Right Ear: Hearing, external ear and ear canal normal.  Left Ear: Hearing, external ear and ear canal normal.  Nose: Mucosal edema and rhinorrhea present.  Mouth/Throat: Mucous membranes are normal. Posterior oropharyngeal edema and posterior oropharyngeal erythema present.  TMs dull bilaterally, patient with edematous nasal turbinates with mild clear nasal discharge, patient with mild posterior pharynx erythema  Neck: Normal range of motion. Neck supple. Thyromegaly present.  Cardiovascular: Normal rate  and regular rhythm.  Pulmonary/Chest: Effort normal and breath sounds normal.  Abdominal: Soft. There is no abdominal tenderness. There is no rebound and no guarding.  Musculoskeletal:        General: No tenderness or edema.  Lymphadenopathy:    She has no cervical adenopathy.  Neurological: She is alert and oriented to person, place, and time.  Skin: Skin is warm and dry.  Psychiatric: She has a normal mood and affect. Her behavior is normal.  Nursing note and vitals reviewed.   BP 121/86   Pulse 78   Temp (!) 97.4 F (36.3 C) (Temporal)   Resp 17   Ht 5\' 4"  (1.626 m)   Wt 191 lb 6.4 oz (86.8 kg)   LMP 02/25/2019   SpO2 97%   BMI 32.85 kg/m  Wt Readings from Last 3 Encounters:  03/20/19 191 lb 6.4 oz (86.8 kg)  02/22/19 185 lb (83.9 kg)  12/20/18 194 lb 3.2 oz (88.1 kg)     Health Maintenance Due  Topic Date Due  . TETANUS/TDAP  10/07/2000    There are no preventive care reminders to display for this patient.  Lab Results  Component Value Date   TSH 1.034 10/23/2014   Lab Results  Component Value Date   WBC 13.6 (H) 12/18/2018   HGB 12.6 12/18/2018   HCT 37.9 12/18/2018   MCV 89.2 12/18/2018   PLT 279 12/18/2018   Lab Results  Component Value Date   NA 134 (L) 12/18/2018   K 3.5 12/18/2018   CO2 22  12/18/2018   GLUCOSE 95 12/18/2018   BUN 16 12/18/2018   CREATININE 1.06 (H) 12/18/2018   BILITOT 0.5 12/18/2018   ALKPHOS 43 12/18/2018   AST 15 12/18/2018   ALT 13 12/18/2018   PROT 5.8 (L) 12/18/2018   ALBUMIN 3.3 (L) 12/18/2018   CALCIUM 9.1 12/18/2018   ANIONGAP 6 12/18/2018   No results found for: CHOL No results found for: HDL No results found for: LDLCALC No results found for: TRIG No results found for: CHOLHDL No results found for: HGBA1C    Assessment & Plan:  1. Allergic reaction to drug, subsequent encounter Patient's emergency department notes reviewed.  Patient's ED exam was unremarkable without any signs of edema to the throat, lips, tongue or mouth.  Patient should avoid the use of Prilosec OTC and at this time should also avoid omeprazole even though she has taken this in the past without difficulty.  Patient is encouraged to carry Benadryl with her in case of subsequent issues with itching.  2. Dysfunction of right eustachian tube: 4.  Thyromegaly Patient with complaint of sensation of fullness/pressure in the right ear as well as sensation of itchiness on the right side of the throat.  I discussed with the patient that she likely has eustachian tube dysfunction as a result of allergic rhinitis however patient does not believe that she has any issues with allergic rhinitis.  Patient request ENT referral.  Referral placed for ENT in follow-up of patient's ear pressure and sensation of throat itching as well as tonsillar stones.  Patient was given information on eustachian tube dysfunction as part of her after visit summary.  Patient was also noted to have some mild thyromegaly on exam for which she can also follow-up with ENT but TSH will also be done at today's visit to look for any thyroid dysfunction. - Ambulatory referral to ENT -TSH  3. Dyspepsia Prescription provided for Pepcid to take twice daily as  needed for dyspepsia/acid reflux symptoms. - famotidine  (PEPCID) 20 MG tablet; Take 1 tablet (20 mg total) by mouth 2 (two) times daily.  Dispense: 60 tablet; Refill: 5  An After Visit Summary was printed and given to the patient.   Follow-up: Return in about 4 months (around 07/21/2019), or if symptoms worsen or fail to improve.   Antony Blackbird, MD

## 2019-03-20 NOTE — Patient Instructions (Signed)
Eustachian Tube Dysfunction  Eustachian tube dysfunction refers to a condition in which a blockage develops in the narrow passage that connects the middle ear to the back of the nose (eustachian tube). The eustachian tube regulates air pressure in the middle ear by letting air move between the ear and nose. It also helps to drain fluid from the middle ear space. Eustachian tube dysfunction can affect one or both ears. When the eustachian tube does not function properly, air pressure, fluid, or both can build up in the middle ear. What are the causes? This condition occurs when the eustachian tube becomes blocked or cannot open normally. Common causes of this condition include:  Ear infections.  Colds and other infections that affect the nose, mouth, and throat (upper respiratory tract).  Allergies.  Irritation from cigarette smoke.  Irritation from stomach acid coming up into the esophagus (gastroesophageal reflux). The esophagus is the tube that carries food from the mouth to the stomach.  Sudden changes in air pressure, such as from descending in an airplane or scuba diving.  Abnormal growths in the nose or throat, such as: ? Growths that line the nose (nasal polyps). ? Abnormal growth of cells (tumors). ? Enlarged tissue at the back of the throat (adenoids). What increases the risk? You are more likely to develop this condition if:  You smoke.  You are overweight.  You are a child who has: ? Certain birth defects of the mouth, such as cleft palate. ? Large tonsils or adenoids. What are the signs or symptoms? Common symptoms of this condition include:  A feeling of fullness in the ear.  Ear pain.  Clicking or popping noises in the ear.  Ringing in the ear.  Hearing loss.  Loss of balance.  Dizziness. Symptoms may get worse when the air pressure around you changes, such as when you travel to an area of high elevation, fly on an airplane, or go scuba diving. How is  this diagnosed? This condition may be diagnosed based on:  Your symptoms.  A physical exam of your ears, nose, and throat.  Tests, such as those that measure: ? The movement of your eardrum (tympanogram). ? Your hearing (audiometry). How is this treated? Treatment depends on the cause and severity of your condition.  In mild cases, you may relieve your symptoms by moving air into your ears. This is called "popping the ears."  In more severe cases, or if you have symptoms of fluid in your ears, treatment may include: ? Medicines to relieve congestion (decongestants). ? Medicines that treat allergies (antihistamines). ? Nasal sprays or ear drops that contain medicines that reduce swelling (steroids). ? A procedure to drain the fluid in your eardrum (myringotomy). In this procedure, a small tube is placed in the eardrum to:  Drain the fluid.  Restore the air in the middle ear space. ? A procedure to insert a balloon device through the nose to inflate the opening of the eustachian tube (balloon dilation). Follow these instructions at home: Lifestyle  Do not do any of the following until your health care provider approves: ? Travel to high altitudes. ? Fly in airplanes. ? Work in a pressurized cabin or room. ? Scuba dive.  Do not use any products that contain nicotine or tobacco, such as cigarettes and e-cigarettes. If you need help quitting, ask your health care provider.  Keep your ears dry. Wear fitted earplugs during showering and bathing. Dry your ears completely after. General instructions  Take over-the-counter   and prescription medicines only as told by your health care provider.  Use techniques to help pop your ears as recommended by your health care provider. These may include: ? Chewing gum. ? Yawning. ? Frequent, forceful swallowing. ? Closing your mouth, holding your nose closed, and gently blowing as if you are trying to blow air out of your nose.  Keep all  follow-up visits as told by your health care provider. This is important. Contact a health care provider if:  Your symptoms do not go away after treatment.  Your symptoms come back after treatment.  You are unable to pop your ears.  You have: ? A fever. ? Pain in your ear. ? Pain in your head or neck. ? Fluid draining from your ear.  Your hearing suddenly changes.  You become very dizzy.  You lose your balance. Summary  Eustachian tube dysfunction refers to a condition in which a blockage develops in the eustachian tube.  It can be caused by ear infections, allergies, inhaled irritants, or abnormal growths in the nose or throat.  Symptoms include ear pain, hearing loss, or ringing in the ears.  Mild cases are treated with maneuvers to unblock the ears, such as yawning or ear popping.  Severe cases are treated with medicines. Surgery may also be done (rare). This information is not intended to replace advice given to you by your health care provider. Make sure you discuss any questions you have with your health care provider. Document Released: 09/06/2015 Document Revised: 11/30/2017 Document Reviewed: 11/30/2017 Elsevier Patient Education  2020 [employer] for Gastroesophageal Reflux Disease, Adult When you have gastroesophageal reflux disease (GERD), the foods you eat and your eating habits are very important. Choosing the right foods can help ease your discomfort. Think about working with a nutrition specialist (dietitian) to help you make good choices. What are tips for following this plan?  Meals  Choose healthy foods that are low in fat, such as fruits, vegetables, whole grains, low-fat dairy products, and lean meat, fish, and poultry.  Eat small meals often instead of 3 large meals a day. Eat your meals slowly, and in a place where you are relaxed. Avoid bending over or lying down until 2-3 hours after eating.  Avoid eating meals 2-3 hours before  bed.  Avoid drinking a lot of liquid with meals.  Cook foods using methods other than frying. Bake, grill, or broil food instead.  Avoid or limit: ? Chocolate. ? Peppermint or spearmint. ? Alcohol. ? Pepper. ? Black and decaffeinated coffee. ? Black and decaffeinated tea. ? Bubbly (carbonated) soft drinks. ? Caffeinated energy drinks and soft drinks.  Limit high-fat foods such as: ? Fatty meat or fried foods. ? Whole milk, cream, butter, or ice cream. ? Nuts and nut butters. ? Pastries, donuts, and sweets made with butter or shortening.  Avoid foods that cause symptoms. These foods may be different for everyone. Common foods that cause symptoms include: ? Tomatoes. ? Oranges, lemons, and limes. ? Peppers. ? Spicy food. ? Onions and garlic. ? Vinegar. Lifestyle  Maintain a healthy weight. Ask your doctor what weight is healthy for you. If you need to lose weight, work with your doctor to do so safely.  Exercise for at least 30 minutes for 5 or more days each week, or as told by your doctor.  Wear loose-fitting clothes.  Do not smoke. If you need help quitting, ask your doctor.  Sleep with the head of your bed  higher than your feet. Use a wedge under the mattress or blocks under the bed frame to raise the head of the bed. Summary  When you have gastroesophageal reflux disease (GERD), food and lifestyle choices are very important in easing your symptoms.  Eat small meals often instead of 3 large meals a day. Eat your meals slowly, and in a place where you are relaxed.  Limit high-fat foods such as fatty meat or fried foods.  Avoid bending over or lying down until 2-3 hours after eating.  Avoid peppermint and spearmint, caffeine, alcohol, and chocolate. This information is not intended to replace advice given to you by your health care provider. Make sure you discuss any questions you have with your health care provider. Document Released: 02/09/2012 Document Revised:  12/01/2018 Document Reviewed: 09/15/2016 Elsevier Patient Education  2020 [employer].

## 2019-03-21 LAB — TSH: TSH: 1.74 u[IU]/mL (ref 0.450–4.500)

## 2019-03-22 NOTE — Progress Notes (Signed)
Normal lab letter mailed.

## 2019-03-30 DIAGNOSIS — H938X3 Other specified disorders of ear, bilateral: Secondary | ICD-10-CM | POA: Diagnosis not present

## 2019-03-30 DIAGNOSIS — J358 Other chronic diseases of tonsils and adenoids: Secondary | ICD-10-CM | POA: Diagnosis not present

## 2019-03-30 DIAGNOSIS — L299 Pruritus, unspecified: Secondary | ICD-10-CM | POA: Insufficient documentation

## 2019-06-19 ENCOUNTER — Telehealth: Payer: Self-pay

## 2019-06-19 NOTE — Telephone Encounter (Signed)
Called patient to do their pre-visit COVID screening.  Call went to voicemail. Unable to do prescreening.  

## 2019-06-20 ENCOUNTER — Other Ambulatory Visit: Payer: Self-pay

## 2019-06-20 ENCOUNTER — Ambulatory Visit (INDEPENDENT_AMBULATORY_CARE_PROVIDER_SITE_OTHER): Payer: Medicaid Other | Admitting: Internal Medicine

## 2019-06-20 VITALS — BP 132/88 | HR 82 | Temp 97.5°F | Resp 17 | Wt 190.0 lb

## 2019-06-20 DIAGNOSIS — R3589 Other polyuria: Secondary | ICD-10-CM

## 2019-06-20 DIAGNOSIS — N946 Dysmenorrhea, unspecified: Secondary | ICD-10-CM

## 2019-06-20 DIAGNOSIS — R399 Unspecified symptoms and signs involving the genitourinary system: Secondary | ICD-10-CM

## 2019-06-20 DIAGNOSIS — R358 Other polyuria: Secondary | ICD-10-CM

## 2019-06-20 LAB — POCT URINALYSIS DIP (CLINITEK)
Bilirubin, UA: NEGATIVE
Glucose, UA: NEGATIVE mg/dL
Ketones, POC UA: NEGATIVE mg/dL
Leukocytes, UA: NEGATIVE
Nitrite, UA: POSITIVE — AB
POC PROTEIN,UA: NEGATIVE
Spec Grav, UA: >=1.03 — AB (ref 1.010–1.025)
Urobilinogen, UA: 0.2 E.U./dL
pH, UA: 6 (ref 5.0–8.0)

## 2019-06-20 MED ORDER — CIPROFLOXACIN HCL 500 MG PO TABS: 500.0000 mg | ORAL_TABLET | Freq: Two times a day (BID) | ORAL | 0 refills | Status: DC

## 2019-06-20 NOTE — Progress Notes (Signed)
Patient ID: Hannah Fleming, female    DOB: [birth year]  MRN: [id number]  CC: Urinary Tract Infection   Subjective: Hannah Fleming is a 38 y.o. female who presents for UC visit Her concerns today include:   Pt c/o odor and cloudiness to urine, urine frequency day and night and mid back pain since August.  Symptoms worse over past several days.  No incontinence of urine.  No pain with urination.   Back pain is worse during menses.  Menses last 7 days.  Had miscarriage in April and menses different and varies since then.  Can be heavy first 2 days, then light then heavy again last 2 days.  Very bad menstrual cramps and back pain. Her GYN use to give her Oxycodone with Ibuprofen to take first few days of cycle.  He stopped that and tried to put her on BCP but pt did not want to be on BCP.  She tells me he checked her for fibroids in past due to heavy painful menses and that was negative.  No fever, N/V.  No abn vaginal dischg No frequent thirst. No fhx of DM Patient Active Problem List   Diagnosis Date Noted  . Trichomonas contact 12/21/2016  . Exposure to sexually transmitted disease (STD) 06/03/2016  . Unspecified disorder of menstruation and other abnormal bleeding from female genital tract 12/28/2013  . Anxiety 02/07/2013  . Dysmenorrhea 02/07/2013     Current Outpatient Medications on File Prior to Visit  Medication Sig Dispense Refill  . famotidine (PEPCID) 20 MG tablet Take 1 tablet (20 mg total) by mouth 2 (two) times daily. 60 tablet 5  . Multiple Vitamins-Minerals (MULTIVITAMIN ADULT) TABS Take 1 tablet by mouth daily.     No current facility-administered medications on file prior to visit.     Allergies  Allergen Reactions  . Prilosec Otc [Omeprazole Magnesium] Hives  . Latex Itching and Rash    Social History   Socioeconomic History  . Marital status: Single    Spouse name: Not on file  . Number of children: 3  . Years of education: Not on file  . Highest  education level: Not on file  Occupational History  . Not on file  Social Needs  . Financial resource strain: Not on file  . Food insecurity    Worry: Not on file    Inability: Not on file  . Transportation needs    Medical: Not on file    Non-medical: Not on file  Tobacco Use  . Smoking status: Former Smoker    Types: Cigarettes    Quit date: 02/07/2009    Years since quitting: 10.3  . Smokeless tobacco: Never Used  Substance and Sexual Activity  . Alcohol use: Not Currently    Alcohol/week: 0.0 standard drinks    Comment: occasional  . Drug use: No  . Sexual activity: Yes    Partners: Male    Birth control/protection: None  Lifestyle  . Physical activity    Days per week: Not on file    Minutes per session: Not on file  . Stress: Not on file  Relationships  . Social Herbalist on phone: Not on file    Gets together: Not on file    Attends religious service: Not on file    Active member of club or organization: Not on file    Attends meetings of clubs or organizations: Not on file    Relationship status: Not on file  .  Intimate partner violence    Fear of current or ex partner: Not on file    Emotionally abused: Not on file    Physically abused: Not on file    Forced sexual activity: Not on file  Other Topics Concern  . Not on file  Social History Narrative  . Not on file    Family History  Problem Relation Age of Onset  . Healthy Mother   . Healthy Father   . Other Neg Hx     Past Surgical History:  Procedure Laterality Date  . DILATION AND CURETTAGE OF UTERUS    . INDUCED ABORTION    . WISDOM TOOTH EXTRACTION      ROS: Review of Systems Negative except as stated above  PHYSICAL EXAM: BP 132/88   Pulse 82   Temp (!) 97.5 F (36.4 C) (Temporal)   Resp 17   Wt 190 lb (86.2 kg)   LMP 06/01/2019 (Exact Date)   SpO2 98%   BMI 32.61 kg/m   Physical Exam  General appearance - alert, well appearing, and in no distress Abdomen - soft,  nontender, nondistended, no masses or organomegaly Musculoskeletal -mild tenderness on palpation of the left flank.  Mild tenderness on palpation of the mid to lower thoracic spine.   CMP Latest Ref Rng & Units 12/18/2018 10/23/2014 12/05/2013  Glucose 70 - 99 mg/dL 95 88 84  BUN 6 - 20 mg/dL 16 14 12   Creatinine 0.44 - 1.00 mg/dL 1.06(H) 0.88 0.82  Sodium 135 - 145 mmol/L 134(L) 140 140  Potassium 3.5 - 5.1 mmol/L 3.5 4.3 4.0  Chloride 98 - 111 mmol/L 106 106 103  CO2 22 - 32 mmol/L 22 21 24   Calcium 8.9 - 10.3 mg/dL 9.1 9.2 9.4  Total Protein 6.5 - 8.1 g/dL 5.8(L) - 7.3  Total Bilirubin 0.3 - 1.2 mg/dL 0.5 - <0.2(L)  Alkaline Phos 38 - 126 U/L 43 - 56  AST 15 - 41 U/L 15 - 13  ALT 0 - 44 U/L 13 - 8   Lipid Panel  No results found for: CHOL, TRIG, HDL, CHOLHDL, VLDL, LDLCALC, LDLDIRECT  CBC    Component Value Date/Time   WBC 13.6 (H) 12/18/2018 2336   RBC 4.25 12/18/2018 2336   HGB 12.6 12/18/2018 2336   HCT 37.9 12/18/2018 2336   PLT 279 12/18/2018 2336   MCV 89.2 12/18/2018 2336   MCH 29.6 12/18/2018 2336   MCHC 33.2 12/18/2018 2336   RDW 13.5 12/18/2018 2336   LYMPHSABS 3.4 12/18/2018 2336   MONOABS 0.9 12/18/2018 2336   EOSABS 0.2 12/18/2018 2336   BASOSABS 0.0 12/18/2018 2336   Results for orders placed or performed in visit on 06/20/19  POCT URINALYSIS DIP (CLINITEK)  Result Value Ref Range   Color, UA yellow yellow   Clarity, UA clear clear   Glucose, UA negative negative mg/dL   Bilirubin, UA negative negative   Ketones, POC UA negative negative mg/dL   Spec Grav, UA >=1.030 (A) 1.010 - 1.025   Blood, UA small (A) negative   pH, UA 6.0 5.0 - 8.0   POC PROTEIN,UA negative negative, trace   Urobilinogen, UA 0.2 0.2 or 1.0 E.U./dL   Nitrite, UA Positive (A) Negative   Leukocytes, UA Negative Negative    ASSESSMENT AND PLAN: 1. UTI symptoms Urine dip today is positive for nitrates but no leukocyte esterase.  We will send urine for culture.  In the meantime  we will put her on 3  days of Cipro antibiotics. - POCT URINALYSIS DIP (CLINITEK) - Urine Culture - ciprofloxacin (CIPRO) 500 MG tablet; Take 1 tablet (500 mg total) by mouth 2 (two) times daily.  Dispense: 6 tablet; Refill: 0  2. Polyuria - Hemoglobin A1c  3. Dysmenorrhea Recommend using ibuprofen 800 mg every 8 hours as needed for dysmenorrhea and back pain  Patient was given the opportunity to ask questions.  Patient verbalized understanding of the plan and was able to repeat key elements of the plan.   Orders Placed This Encounter  Procedures  . POCT URINALYSIS DIP (CLINITEK)     Requested Prescriptions    No prescriptions requested or ordered in this encounter    No follow-ups on file.  Karle Plumber, MD, FACP

## 2019-06-20 NOTE — Patient Instructions (Signed)
Purchase and use Ibuprofen over the counter and use as needed for menstrual cramps and back pain.  They sell the 200 mg over the counter.  You can take 4 tablets every 8 hrs as needed.

## 2019-06-20 NOTE — Progress Notes (Signed)
LMP 06/01/2019  Has c/o urinary frequency, lower back pain, scanty urination, urine odor. Denies nausea, vomiting, chills, fever.

## 2019-06-21 ENCOUNTER — Encounter: Payer: Self-pay | Admitting: Internal Medicine

## 2019-06-21 DIAGNOSIS — R7303 Prediabetes: Secondary | ICD-10-CM | POA: Insufficient documentation

## 2019-06-21 LAB — HEMOGLOBIN A1C
Est. average glucose Bld gHb Est-mCnc: 120 mg/dL
Hgb A1c MFr Bld: 5.8 % — ABNORMAL HIGH (ref 4.8–5.6)

## 2019-06-22 ENCOUNTER — Other Ambulatory Visit: Payer: Self-pay | Admitting: Internal Medicine

## 2019-06-22 LAB — URINE CULTURE

## 2019-06-22 MED ORDER — CIPROFLOXACIN HCL 500 MG PO TABS: 500.0000 mg | ORAL_TABLET | Freq: Two times a day (BID) | ORAL | 0 refills | Status: DC

## 2019-07-25 ENCOUNTER — Ambulatory Visit (INDEPENDENT_AMBULATORY_CARE_PROVIDER_SITE_OTHER): Payer: Medicaid Other | Admitting: Internal Medicine

## 2019-07-25 DIAGNOSIS — K219 Gastro-esophageal reflux disease without esophagitis: Secondary | ICD-10-CM

## 2019-07-25 NOTE — Progress Notes (Signed)
Patient following up on dyspepsia. States that her symptoms have mostly resolved with the Pepcid. Does have occasional heartburn but only when she eats something she shouldn't

## 2019-07-25 NOTE — Progress Notes (Signed)
Virtual Visit via Telephone Note Due to current restrictions/limitations of in-office visits due to the COVID-19 pandemic, this scheduled clinical appointment was converted to a telehealth visit  I connected with Hannah Fleming on 07/25/19 at 10:46 a.m by telephone and verified that I am speaking with the correct person using two identifiers. I am in my office.  The patient is at home.  Only the patient and myself participated in this encounter.  I discussed the limitations, risks, security and privacy concerns of performing an evaluation and management service by telephone and the availability of in person appointments. I also discussed with the patient that there may be a patient responsible charge related to this service. The patient expressed understanding and agreed to proceed.   History of Present Illness: Pt with hx of dysmenorrhea, anxiety, GERD.  Today's visit was for follow-up on GERD.  GERD: Patient previously on omeprazole.  However it was discontinued after she had an allergic reaction to brand-name Prilosec.  She was switched to Pepcid instead by Dr. Chapman Fitch.  Doing well on Pepcid and avoids foods that cause flare of heartburn  HM: Declines flu vaccine  Outpatient Encounter Medications as of 07/25/2019  Medication Sig  . famotidine (PEPCID) 20 MG tablet Take 1 tablet (20 mg total) by mouth 2 (two) times daily.  . Multiple Vitamins-Minerals (MULTIVITAMIN ADULT) TABS Take 1 tablet by mouth daily.  . [DISCONTINUED] ciprofloxacin (CIPRO) 500 MG tablet Take 1 tablet (500 mg total) by mouth 2 (two) times daily.  . [DISCONTINUED] ciprofloxacin (CIPRO) 500 MG tablet Take 1 tablet (500 mg total) by mouth 2 (two) times daily.   No facility-administered encounter medications on file as of 07/25/2019.       Observations/Objective: No direct observation done as this was a telephone encounter  Assessment and Plan: 1. Gastroesophageal reflux disease without esophagitis -Doing well on  Pepcid.  She will continue Pepcid 20 mg twice a day.  Continue to avoid foods that make acid reflux worse.  She will follow-up as needed.   Follow Up Instructions: PRN   I discussed the assessment and treatment plan with the patient. The patient was provided an opportunity to ask questions and all were answered. The patient agreed with the plan and demonstrated an understanding of the instructions.   The patient was advised to call back or seek an in-person evaluation if the symptoms worsen or if the condition fails to improve as anticipated.  I provided 3 minutes of non-face-to-face time during this encounter.   Karle Plumber, MD

## 2019-08-02 ENCOUNTER — Ambulatory Visit
Admission: EM | Admit: 2019-08-02 | Discharge: 2019-08-02 | Disposition: A | Discharge status: Home / Self Care | Payer: Medicaid Other | Attending: Emergency Medicine | Admitting: Emergency Medicine

## 2019-08-02 ENCOUNTER — Other Ambulatory Visit: Payer: Self-pay

## 2019-08-02 DIAGNOSIS — R5383 Other fatigue: Secondary | ICD-10-CM

## 2019-08-02 DIAGNOSIS — Z20828 Contact with and (suspected) exposure to other viral communicable diseases: Secondary | ICD-10-CM | POA: Diagnosis not present

## 2019-08-02 DIAGNOSIS — R5381 Other malaise: Secondary | ICD-10-CM

## 2019-08-02 DIAGNOSIS — Z20822 Contact with and (suspected) exposure to covid-19: Secondary | ICD-10-CM

## 2019-08-02 NOTE — ED Provider Notes (Signed)
[name]    CSN: [id number] Arrival date & time: 08/02/19  1314      History   Chief Complaint Chief Complaint  Patient presents with  . Fatigue    HPI Hannah Fleming is a 38 y.o. female   Presenting for Covid testing: Exposure: Fianc: Tested positive last night, symptomatic x1 week Date of exposure: All weekend Any fever, symptoms since exposure: Yes: Fatigue, malaise.  Not currently take anything for symptoms.    Past Medical History:  Diagnosis Date  . Genital herpes    no outbreak in 56yrs  . Infection    UTI  . No pertinent past medical history   . Trichomonas infection     Patient Active Problem List   Diagnosis Date Noted  . Prediabetes 06/21/2019  . Trichomonas contact 12/21/2016  . Exposure to sexually transmitted disease (STD) 06/03/2016  . Unspecified disorder of menstruation and other abnormal bleeding from female genital tract 12/28/2013  . Anxiety 02/07/2013  . Dysmenorrhea 02/07/2013    Past Surgical History:  Procedure Laterality Date  . DILATION AND CURETTAGE OF UTERUS    . INDUCED ABORTION    . WISDOM TOOTH EXTRACTION      OB History    Gravida  8   Para  3   Term  3   Preterm  0   AB  5   Living  3     SAB  1   TAB  3   Ectopic  0   Multiple  0   Live Births  3            Home Medications    Prior to Admission medications   Medication Sig Start Date End Date Taking? Authorizing Provider  famotidine (PEPCID) 20 MG tablet Take 1 tablet (20 mg total) by mouth 2 (two) times daily. 03/20/19   Fulp, Cammie, MD  Multiple Vitamins-Minerals (MULTIVITAMIN ADULT) TABS Take 1 tablet by mouth daily.    [provider]    Family History Family History  Problem Relation Age of Onset  . Healthy Mother   . Healthy Father   . Other Neg Hx     Social History Social History   Tobacco Use  . Smoking status: Former Smoker    Types: Cigarettes    Quit date: 02/07/2009    Years since  quitting: 10.4  . Smokeless tobacco: Never Used  Substance Use Topics  . Alcohol use: Not Currently    Alcohol/week: 0.0 standard drinks    Comment: occasional  . Drug use: No     Allergies   Prilosec otc [omeprazole magnesium] and Latex   Review of Systems Review of Systems  Constitutional: Positive for fatigue. Negative for fever.  HENT: Negative for ear pain, sinus pain, sore throat and voice change.   Eyes: Negative for pain, redness and visual disturbance.  Respiratory: Negative for cough, shortness of breath, wheezing and stridor.   Cardiovascular: Negative for chest pain, palpitations and leg swelling.  Gastrointestinal: Negative for abdominal distention, abdominal pain, diarrhea, nausea and vomiting.  Musculoskeletal: Negative for arthralgias, myalgias, neck pain and neck stiffness.  Skin: Negative for rash and wound.  Neurological: Negative for syncope and headaches.     Physical Exam Triage Vital Signs ED Triage Vitals  Enc Vitals Group     BP 08/02/19 1325 116/82     Pulse Rate 08/02/19 1325 75     Resp 08/02/19 1325 18     Temp 08/02/19 1325  97.7 F (36.5 C)     Temp Source 08/02/19 1325 Oral     SpO2 08/02/19 1325 97 %     Weight --      Height --      Head Circumference --      Peak Flow --      Pain Score 08/02/19 1326 0     Pain Loc --      Pain Edu? --      Excl. in [place]? --    No data found.  Updated Vital Signs BP 116/82 (BP Location: Left Arm)   Pulse 75   Temp 97.7 F (36.5 C) (Oral)   Resp 18   LMP 07/20/2019   SpO2 97%   Visual Acuity Right Eye Distance:   Left Eye Distance:   Bilateral Distance:    Right Eye Near:   Left Eye Near:    Bilateral Near:     Physical Exam Constitutional:      General: She is not in acute distress.    Appearance: She is ill-appearing. She is not toxic-appearing or diaphoretic.  HENT:     Head: Normocephalic and atraumatic.     Mouth/Throat:     Mouth: Mucous membranes are moist.     Pharynx:  Oropharynx is clear.  Eyes:     General: No scleral icterus.    Conjunctiva/sclera: Conjunctivae normal.     Pupils: Pupils are equal, round, and reactive to light.  Cardiovascular:     Rate and Rhythm: Normal rate.  Pulmonary:     Effort: Pulmonary effort is normal. No respiratory distress.     Breath sounds: No stridor. No wheezing.  Musculoskeletal: Normal range of motion.     Right lower leg: No edema.     Left lower leg: No edema.  Skin:    Coloration: Skin is not jaundiced or pale.  Neurological:     Mental Status: She is alert and oriented to person, place, and time.      UC Treatments / Results  Labs (all labs ordered are listed, but only abnormal results are displayed) Labs Reviewed  NOVEL CORONAVIRUS, NAA    EKG   Radiology No results found.  Procedures Procedures (including critical care time)  Medications Ordered in UC Medications - No data to display  Initial Impression / Assessment and Plan / UC Course  I have reviewed the triage vital signs and the nursing notes.  Pertinent labs & imaging results that were available during my care of the patient were reviewed by me and considered in my medical decision making (see chart for details).     Patient afebrile, nontoxic.  Covid PCR pending: Patient to quarantine until results are back.  Will treat supportively as outlined during appointment.  Return precautions discussed, patient verbalized understanding and is agreeable to plan. Final Clinical Impressions(s) / UC Diagnoses   Final diagnoses:  Exposure to COVID-19 virus  Other fatigue     Discharge Instructions     Your COVID test is pending - it is important to quarantine / isolate at home until your results are back. If you test positive and would like further evaluation for persistent or worsening symptoms, you may schedule an E-visit or virtual (video) visit throughout the [facility] app or website.  PLEASE NOTE: If you develop severe  chest pain or shortness of breath please go to the ER or call 9-1-1 for further evaluation --> DO NOT schedule electronic or virtual visits for this.  Please call our office for further guidance / recommendations as needed.    ED Prescriptions    None     PDMP not reviewed this encounter.   Hall[place]Potvin, [place], [place] 08/02/19 1354

## 2019-08-02 NOTE — ED Triage Notes (Signed)
Pt c/o fatigue since Monday, "not feeling good"; states her partner was tested positive last night for COVID

## 2019-08-02 NOTE — Discharge Instructions (Signed)
Your COVID test is pending - it is important to quarantine / isolate at home until your results are back. °If you test positive and would like further evaluation for persistent or worsening symptoms, you may schedule an E-visit or virtual (video) visit throughout the [facility] MyChart app or website. ° °PLEASE NOTE: If you develop severe chest pain or shortness of breath please go to the ER or call 9-1-1 for further evaluation --> DO NOT schedule electronic or virtual visits for this. °Please call our office for further guidance / recommendations as needed. °

## 2019-08-04 LAB — NOVEL CORONAVIRUS, NAA
SARS-CoV-2, NAA: NOT DETECTED
SARS-CoV-2, NAA: NOT DETECTED

## 2019-09-12 ENCOUNTER — Ambulatory Visit (INDEPENDENT_AMBULATORY_CARE_PROVIDER_SITE_OTHER): Payer: Medicaid Other | Admitting: Internal Medicine

## 2019-09-12 ENCOUNTER — Encounter: Payer: Self-pay | Admitting: Internal Medicine

## 2019-09-12 DIAGNOSIS — R519 Headache, unspecified: Secondary | ICD-10-CM | POA: Diagnosis not present

## 2019-09-12 MED ORDER — SUMATRIPTAN SUCCINATE 50 MG PO TABS: 50.0000 mg | ORAL_TABLET | ORAL | 0 refills | Status: DC | PRN

## 2019-09-12 NOTE — Progress Notes (Signed)
Headaches x 1 month Denies blurred vision or dizziness  Taking more tylenol and iburprofen   Wakes up with headaches at times or will onset throughout the day. Denies knowing triggers.   Denies congestion or runny nose

## 2019-09-12 NOTE — Progress Notes (Signed)
Virtual Visit via Telephone Note  I connected with Hannah Fleming, on 09/12/2019 at 1:38 PM by telephone due to the COVID-19 pandemic and verified that I am speaking with the correct person using two identifiers.   Consent: I discussed the limitations, risks, security and privacy concerns of performing an evaluation and management service by telephone and the availability of in person appointments. I also discussed with the patient that there may be a patient responsible charge related to this service. The patient expressed understanding and agreed to proceed.   Location of Patient: Home   Location of Provider: Clinic    Persons participating in Telemedicine visit: Stanisha Schacher [facility] Dr. Juleen China      History of Present Illness: HEADACHE   Patient reports that years ago she used to have migraine headaches. She has not had one for many years after making diet changes.   Will wake up from her headache sometimes and also notices them during the afternoon. She is experiencing them consistently to the point that she needs to carry tylenol or ibuprofen in her purse with her. Usually when she gets a headache she doesn't feel like doing anything. If she is at work, she is able to continue with her shift.   Sleeps about 5-6 hours per night. Will sometimes take a nap after she gets her son to school. Drinks a lot of water throughout the day.   Headaches started about 1 month ago. Two weeks ago, she noticed that she was having headaches very frequently. Needing to take medications every other day on average. Averages 3 headaches per week.   Pain is typically in the temporal region bilaterally. But sometimes it is on one side. Pain is typically sharp in nature.  Keeps from doing:  Has called out of the work twice due to headache (might have been related to menstrual cycle per patient report).  Medications tried: Tylenol and Ibuprofen relieve the headache   Head trauma: No   Sudden onset: No  Previous similar headaches: does not feel similar to her migraines, reports the migraines were worse in terms of pain intensity  Taking blood thinners: No  History of cancer: no   Symptoms Nose congestion stuffiness:  No  Nausea vomiting: occurs occasionally  Photophobia: no  Noise sensitivity: yes  Double vision or loss of vision: no  Fever: no  Neck Stiffness: no Trouble walking or speaking: no  Has had some right and left arm numbness/tingling but reports that was occurring prior to her headaches and doesn't seem to be associated with them.       Past Medical History:  Diagnosis Date  . Genital herpes    no outbreak in 60yrs  . Infection    UTI  . No pertinent past medical history   . Trichomonas infection    Allergies  Allergen Reactions  . Prilosec Otc [Omeprazole Magnesium] Hives  . Latex Itching and Rash    Current Outpatient Medications on File Prior to Visit  Medication Sig Dispense Refill  . famotidine (PEPCID) 20 MG tablet Take 1 tablet (20 mg total) by mouth 2 (two) times daily. 60 tablet 5  . Multiple Vitamins-Minerals (MULTIVITAMIN ADULT) TABS Take 1 tablet by mouth daily.     No current facility-administered medications on file prior to visit.    Observations/Objective: NAD. Speaking clearly.  Work of breathing normal.  Alert and oriented. Mood appropriate.   Assessment and Plan: 1. Nonintractable episodic headache, unspecified headache type Headache sounds most  consistent with a migraine variant with unilateral and bilateral occurrence and some associated symptoms. Patient has had migraines in the past. Discussed that migraines can be precipitated by menstrual cycles, stress, dehydration, lack of sleep etc. Discussed life style changes like increasing amount of sleep and encouraged to keep a headache log. No red flag symptoms that would warrant imaging at present. Will prescribe abortive therapy and patient can continue to take  NSAIDs with this. If does not see reduction in headaches, would consider daily preventative medication.  - SUMAtriptan (IMITREX) 50 MG tablet; Take 1 tablet (50 mg total) by mouth every 2 (two) hours as needed for migraine. May repeat in 2 hours if headache persists or recurs.  Dispense: 10 tablet; Refill: 0   Follow Up Instructions: Return prn or for annual exam    I discussed the assessment and treatment plan with the patient. The patient was provided an opportunity to ask questions and all were answered. The patient agreed with the plan and demonstrated an understanding of the instructions.   The patient was advised to call back or seek an in-person evaluation if the symptoms worsen or if the condition fails to improve as anticipated.     I provided 24 minutes total of non-face-to-face time during this encounter including median intraservice time, reviewing previous notes, investigations, ordering medications, medical decision making, coordinating care and patient verbalized understanding at the end of the visit.    Phill Myron, D.O. Primary Care at [facility]  09/12/2019, 1:38 PM

## 2019-09-27 ENCOUNTER — Ambulatory Visit: Payer: Medicaid Other | Attending: Internal Medicine

## 2019-09-27 DIAGNOSIS — Z20822 Contact with and (suspected) exposure to covid-19: Secondary | ICD-10-CM | POA: Diagnosis not present

## 2019-09-28 LAB — NOVEL CORONAVIRUS, NAA: SARS-CoV-2, NAA: NOT DETECTED

## 2019-10-03 ENCOUNTER — Telehealth: Payer: Self-pay | Admitting: *Deleted

## 2019-10-03 NOTE — Telephone Encounter (Signed)
Patient called given negative covid results . 

## 2019-10-04 ENCOUNTER — Ambulatory Visit (INDEPENDENT_AMBULATORY_CARE_PROVIDER_SITE_OTHER): Payer: Medicaid Other

## 2019-10-04 ENCOUNTER — Encounter: Payer: Self-pay | Admitting: Emergency Medicine

## 2019-10-04 ENCOUNTER — Other Ambulatory Visit: Payer: Self-pay

## 2019-10-04 ENCOUNTER — Ambulatory Visit
Admission: EM | Admit: 2019-10-04 | Discharge: 2019-10-04 | Disposition: A | Discharge status: Home / Self Care | Payer: Medicaid Other | Attending: Family Medicine | Admitting: Family Medicine

## 2019-10-04 DIAGNOSIS — S8992XA Unspecified injury of left lower leg, initial encounter: Secondary | ICD-10-CM | POA: Diagnosis not present

## 2019-10-04 DIAGNOSIS — W19XXXA Unspecified fall, initial encounter: Secondary | ICD-10-CM

## 2019-10-04 DIAGNOSIS — M25562 Pain in left knee: Secondary | ICD-10-CM | POA: Diagnosis not present

## 2019-10-04 DIAGNOSIS — M79661 Pain in right lower leg: Secondary | ICD-10-CM | POA: Diagnosis not present

## 2019-10-04 MED ORDER — IBUPROFEN 800 MG PO TABS: 800.0000 mg | ORAL_TABLET | Freq: Three times a day (TID) | ORAL | 0 refills | Status: DC | PRN

## 2019-10-04 MED ORDER — CYCLOBENZAPRINE HCL 10 MG PO TABS: 10.0000 mg | ORAL_TABLET | Freq: Two times a day (BID) | ORAL | 0 refills | Status: DC | PRN

## 2019-10-04 NOTE — ED Notes (Signed)
Patient able to ambulate independently  

## 2019-10-04 NOTE — Discharge Instructions (Addendum)
Your xray was negative in the office today. Follow up with ortho if symptoms persist or worsen.  Take the ibuprofen as prescribed.  Rest and elevate your left leg.  Apply ice packs 2-3 times a day for up to 20 minutes each.  Wear the Ace wrap as needed for comfort.    Follow up with your primary care provider or an orthopedist if you symptoms continue or worsen;  Or if you develop new symptoms, such as numbness, tingling, or weakness.

## 2019-10-04 NOTE — ED Provider Notes (Signed)
[place]   [id number] 10/04/19 Arrival Time: 1252  [id number] PAIN  SUBJECTIVE: History from: patient. Hannah Fleming is a 38 y.o. female complains of left knee pain that began 1 day ago. Reports specific injury from fall yesterday and thinks she landed on the inside of her knee.  Localizes the pain to the medial L knee.  Describes the pain as constant and sharp when standing, sore and achy when sitting.  Has tried OTC medications without relief.  Symptoms are made worse with weight bearing and activity.  Denies similar symptoms in the past.  Denies fever, chills, erythema, ecchymosis, effusion, weakness, numbness and tingling, saddle paresthesias, loss of bowel or bladder function.      ROS: As per HPI.  All other pertinent ROS negative.     Past Medical History:  Diagnosis Date  . Genital herpes    no outbreak in 50yrs  . Infection    UTI  . No pertinent past medical history   . Trichomonas infection    Past Surgical History:  Procedure Laterality Date  . DILATION AND CURETTAGE OF UTERUS    . INDUCED ABORTION    . WISDOM TOOTH EXTRACTION     Allergies  Allergen Reactions  . Prilosec Otc [Omeprazole Magnesium] Hives  . Latex Itching and Rash   No current facility-administered medications on file prior to encounter.   Current Outpatient Medications on File Prior to Encounter  Medication Sig Dispense Refill  . SUMAtriptan (IMITREX) 50 MG tablet Take 1 tablet (50 mg total) by mouth every 2 (two) hours as needed for migraine. May repeat in 2 hours if headache persists or recurs. 10 tablet 0  . famotidine (PEPCID) 20 MG tablet Take 1 tablet (20 mg total) by mouth 2 (two) times daily. 60 tablet 5  . Multiple Vitamins-Minerals (MULTIVITAMIN ADULT) TABS Take 1 tablet by mouth daily.     Social History   Socioeconomic History  . Marital status: Single    Spouse name: Not on file  . Number of children: 3  . Years of education: Not on file  . Highest education  level: Not on file  Occupational History  . Not on file  Tobacco Use  . Smoking status: Former Smoker    Types: Cigarettes    Quit date: 02/07/2009    Years since quitting: 10.6  . Smokeless tobacco: Never Used  Substance and Sexual Activity  . Alcohol use: Not Currently    Alcohol/week: 0.0 standard drinks    Comment: occasional  . Drug use: No  . Sexual activity: Yes    Partners: Male    Birth control/protection: None  Other Topics Concern  . Not on file  Social History Narrative  . Not on file   Social Determinants of Health   Financial Resource Strain:   . Difficulty of Paying Living Expenses: Not on file  Food Insecurity:   . Worried About Charity fundraiser in the Last Year: Not on file  . Ran Out of Food in the Last Year: Not on file  Transportation Needs:   . Lack of Transportation (Medical): Not on file  . Lack of Transportation (Non-Medical): Not on file  Physical Activity:   . Days of Exercise per Week: Not on file  . Minutes of Exercise per Session: Not on file  Stress:   . Feeling of Stress : Not on file  Social Connections:   . Frequency of Communication with Friends and Family: Not on file  .  Frequency of Social Gatherings with Friends and Family: Not on file  . Attends Religious Services: Not on file  . Active Member of Clubs or Organizations: Not on file  . Attends Archivist Meetings: Not on file  . Marital Status: Not on file  Intimate Partner Violence:   . Fear of Current or Ex-Partner: Not on file  . Emotionally Abused: Not on file  . Physically Abused: Not on file  . Sexually Abused: Not on file   Family History  Problem Relation Age of Onset  . Healthy Mother   . Healthy Father   . Other Neg Hx     OBJECTIVE:  Vitals:   10/04/19 1302  BP: 125/84  Pulse: 83  Resp: 16  Temp: 98.4 F (36.9 C)  TempSrc: Oral  SpO2: 98%    General appearance: ALERT; in no acute distress.  Head: NCAT Lungs: Normal respiratory  effort CV: pedal pulses 2+ bilaterally. Cap refill < 2 seconds Musculoskeletal:  Inspection: Skin warm, dry, clear and intact without obvious erythema, effusion, or ecchymosis.  Palpation: Nontender to palpation ROM: FROM active and passive, minimal swelling to L medial knee, tenderness to upper and lower medial knee.  Strength: 5/5 shld abduction, 5/5 shld adduction, 5/5 elbow flexion, 5/5 elbow extension, 5/5 grip strength, 5/5 hip flexion, 5/5 knee abduction, 5/5 knee adduction, 5/5 knee flexion, 5/5 knee extension, 5/5 dorsiflexion, 5/5 plantar flexion Stability: Anterior/ posterior drawer intact Skin: warm and dry Neurologic: Ambulates without difficulty; Sensation intact about the upper/ lower extremities Psychological: alert and cooperative; normal mood and affect  DIAGNOSTIC STUDIES:  DG Tibia/Fibula Left  Result Date: 10/04/2019 CLINICAL DATA:  Left lower leg pain after fall yesterday. EXAM: LEFT TIBIA AND FIBULA - 2 VIEW COMPARISON:  None. FINDINGS: No evidence of acute fracture or dislocation. Mild degenerate changes over the visualized left midfoot/hindfoot. IMPRESSION: No acute findings. Electronically Signed   By: Marin Olp M.D.   On: 10/04/2019 13:41   DG Knee Complete 4 Views Left  Result Date: 10/04/2019 CLINICAL DATA:  Fall yesterday with left knee pain. EXAM: LEFT KNEE - COMPLETE 4+ VIEW COMPARISON:  None. FINDINGS: No evidence of fracture, dislocation, or joint effusion. No evidence of arthropathy or other focal bone abnormality. Soft tissues are unremarkable. IMPRESSION: Negative. Electronically Signed   By: Marin Olp M.D.   On: 10/04/2019 13:40     ASSESSMENT & PLAN:  1. Acute pain of left knee     Meds ordered this encounter  Medications  . ibuprofen (ADVIL) 800 MG tablet    Sig: Take 1 tablet (800 mg total) by mouth every 8 (eight) hours as needed for moderate pain.    Dispense:  21 tablet    Refill:  0    Order Specific Question:   Supervising  Provider    Answer:   Chase Picket [id number]  . cyclobenzaprine (FLEXERIL) 10 MG tablet    Sig: Take 1 tablet (10 mg total) by mouth 2 (two) times daily as needed for muscle spasms.    Dispense:  20 tablet    Refill:  0    Order Specific Question:   Supervising Provider    Answer:   Chase Picket [id number]    Continue conservative management of rest, ice, and gentle stretches Take ibuprofen as needed for pain relief (may cause abdominal discomfort, ulcers, and GI bleeds avoid taking with other NSAIDs) Take cyclobenzaprine at nighttime for symptomatic relief. Avoid driving or operating heavy machinery while using  medication. Follow up with PCP if symptoms persist Return or go to the ER if you have any new or worsening symptoms (fever, chills, chest pain, abdominal pain, changes in bowel or bladder habits, pain radiating into lower legs, etc...)   [place] Controlled Substances Registry consulted for this patient. I feel the risk/benefit ratio today is favorable for proceeding with this prescription for a controlled substance. Medication sedation precautions given.  Reviewed expectations re: course of current medical issues. Questions answered. Outlined signs and symptoms indicating need for more acute intervention. Patient verbalized understanding. After Visit Summary given.      Faustino Congress, NP 10/04/19 1346

## 2019-10-04 NOTE — ED Triage Notes (Signed)
Pt presents to [facility] for assessment of left knee pain after stepping off of the escalator early and tripping down a few steps.  States straightening her leg makes it hurt worse.

## 2019-11-12 ENCOUNTER — Other Ambulatory Visit: Payer: Self-pay

## 2019-11-12 ENCOUNTER — Encounter (HOSPITAL_COMMUNITY): Payer: Self-pay | Admitting: Emergency Medicine

## 2019-11-12 ENCOUNTER — Emergency Department (HOSPITAL_COMMUNITY)
Admission: EM | Admit: 2019-11-12 | Discharge: 2019-11-12 | Disposition: A | Discharge status: Home / Self Care | Payer: Medicaid Other | Attending: Emergency Medicine | Admitting: Emergency Medicine

## 2019-11-12 DIAGNOSIS — Z91040 Latex allergy status: Secondary | ICD-10-CM | POA: Insufficient documentation

## 2019-11-12 DIAGNOSIS — L509 Urticaria, unspecified: Secondary | ICD-10-CM

## 2019-11-12 DIAGNOSIS — L0231 Cutaneous abscess of buttock: Secondary | ICD-10-CM | POA: Insufficient documentation

## 2019-11-12 DIAGNOSIS — Z87891 Personal history of nicotine dependence: Secondary | ICD-10-CM | POA: Diagnosis not present

## 2019-11-12 MED ORDER — LIDOCAINE-EPINEPHRINE (PF) 2 %-1:200000 IJ SOLN
10.0000 mL | Freq: Once | INTRAMUSCULAR | Status: AC
  Administered 2019-11-12: 10 mL
  Filled 2019-11-12: qty 20

## 2019-11-12 MED ORDER — FAMOTIDINE 20 MG PO TABS
20.0000 mg | ORAL_TABLET | Freq: Once | ORAL | Status: AC
  Administered 2019-11-12: 20 mg via ORAL
  Filled 2019-11-12: qty 1

## 2019-11-12 MED ORDER — DIPHENHYDRAMINE HCL 25 MG PO CAPS
25.0000 mg | ORAL_CAPSULE | Freq: Once | ORAL | Status: AC
  Administered 2019-11-12: 25 mg via ORAL
  Filled 2019-11-12: qty 1

## 2019-11-12 MED ORDER — DOXYCYCLINE HYCLATE 100 MG PO CAPS: 100.0000 mg | ORAL_CAPSULE | Freq: Two times a day (BID) | ORAL | 0 refills | Status: DC

## 2019-11-12 NOTE — ED Notes (Signed)
Patient verbalizes understanding of discharge instructions. Opportunity for questioning and answers were provided. Armband removed by staff, pt discharged from ED. Ambulated out to lobby  

## 2019-11-12 NOTE — ED Triage Notes (Signed)
C/o hives all over and itching since last night.  Taking Benadryl with some relief.   States this is 2nd time within 3-4 months of having reaction.  Only known allergy is latex.  States it may be related to condom.  Reports abscess on buttocks x 1 week.

## 2019-11-12 NOTE — ED Provider Notes (Signed)
[place] EMERGENCY DEPARTMENT Provider Note   CSN: [id number] Arrival date & time: 11/12/19  0745     History Chief Complaint  Patient presents with  . Urticaria  . Abscess    Hannah Fleming is a 39 y.o. female.  Hannah Fleming is a 39 y.o. female with hx of prediabetes, anxiety, presents for evaluation of hives and possible abscess. Hives began yesterday afternoon after eating at [employer]. She reports hives are all over and extremely itchy, she has taken two doses of benadryl with some relief but states she is still very itchy. Has not tried any other meds to treat symptoms. Does state she has latex allergy, and has used latex condoms twice in the last week and wonders if that could contribute.  No associated facial swelling, difficulty breathing, shortness of breath or throat closing sensation.  No associated nausea, vomiting, abdominal pain or diarrhea.  Patient does also state that she has a painful area on her left buttock and thinks she may be developing an abscess, no previous history of the same, states this has been there for about a week and has been getting increasingly large and painful.  She has not noted any drainage from this area.  No fevers or chills.  Patient has been able to have normal bowel movements.        Past Medical History:  Diagnosis Date  . Genital herpes    no outbreak in 33yrs  . Infection    UTI  . No pertinent past medical history   . Trichomonas infection     Patient Active Problem List   Diagnosis Date Noted  . Prediabetes 06/21/2019  . Unspecified disorder of menstruation and other abnormal bleeding from female genital tract 12/28/2013  . Anxiety 02/07/2013  . Dysmenorrhea 02/07/2013    Past Surgical History:  Procedure Laterality Date  . DILATION AND CURETTAGE OF UTERUS    . INDUCED ABORTION    . WISDOM TOOTH EXTRACTION       OB History    Gravida  8   Para  3   Term  3   Preterm  0   AB  5    Living  3     SAB  1   TAB  3   Ectopic  0   Multiple  0   Live Births  3           Family History  Problem Relation Age of Onset  . Healthy Mother   . Healthy Father   . Other Neg Hx     Social History   Tobacco Use  . Smoking status: Former Smoker    Types: Cigarettes    Quit date: 02/07/2009    Years since quitting: 10.7  . Smokeless tobacco: Never Used  Substance Use Topics  . Alcohol use: Not Currently    Alcohol/week: 0.0 standard drinks    Comment: occasional  . Drug use: No    Home Medications Prior to Admission medications   Medication Sig Start Date End Date Taking? Authorizing Provider  cyclobenzaprine (FLEXERIL) 10 MG tablet Take 1 tablet (10 mg total) by mouth 2 (two) times daily as needed for muscle spasms. 10/04/19   Faustino Congress, NP  doxycycline (VIBRAMYCIN) 100 MG capsule Take 1 capsule (100 mg total) by mouth 2 (two) times daily. One po bid x 7 days 11/12/19   Jacqlyn Larsen, PA-C  famotidine (PEPCID) 20 MG tablet Take 1 tablet (20  mg total) by mouth 2 (two) times daily. 03/20/19   Fulp, Cammie, MD  ibuprofen (ADVIL) 800 MG tablet Take 1 tablet (800 mg total) by mouth every 8 (eight) hours as needed for moderate pain. 10/04/19   Faustino Congress, NP  Multiple Vitamins-Minerals (MULTIVITAMIN ADULT) TABS Take 1 tablet by mouth daily.    [provider]  SUMAtriptan (IMITREX) 50 MG tablet Take 1 tablet (50 mg total) by mouth every 2 (two) hours as needed for migraine. May repeat in 2 hours if headache persists or recurs. 09/12/19   Nicolette Bang, DO    Allergies    Prilosec otc [omeprazole magnesium] and Latex  Review of Systems   Review of Systems  Constitutional: Negative for chills and fever.  HENT: Negative for facial swelling.   Respiratory: Negative for cough and shortness of breath.   Gastrointestinal: Negative for abdominal pain, diarrhea, nausea and vomiting.  Musculoskeletal: Negative for arthralgias and  myalgias.  Skin: Positive for rash.       Abscess    Physical Exam Updated Vital Signs BP 126/67 (BP Location: Right Arm)   Pulse (!) 110   Temp 98 F (36.7 C)   Resp 18   Ht 5\' 5"  (1.651 m)   Wt 83.5 kg   LMP 10/27/2019   SpO2 99%   BMI 30.62 kg/m   Physical Exam Vitals and nursing note reviewed.  Constitutional:      General: She is not in acute distress.    Appearance: Normal appearance. She is well-developed. She is not ill-appearing or diaphoretic.  HENT:     Head: Normocephalic and atraumatic.     Ears:     Comments: No facial swelling    Mouth/Throat:     Comments: Posterior oropharynx clear, no edema Eyes:     General:        Right eye: No discharge.        Left eye: No discharge.  Pulmonary:     Effort: Pulmonary effort is normal. No respiratory distress.  Abdominal:     General: Abdomen is flat. Bowel sounds are normal. There is no distension.     Palpations: Abdomen is soft.     Tenderness: There is no abdominal tenderness.  Genitourinary:    Comments: 2 x 2 centimeter area of fluctuance over the right buttock just distal to the gluteal cleft with 3 cm of surrounding induration and erythema no expressible drainage.  This area does not appear to track towards the rectum. Skin:    General: Skin is warm and dry.     Findings: Rash present.     Comments: Patient with scattered urticaria over the upper and lower extremities, stomach and back  Neurological:     Mental Status: She is alert and oriented to person, place, and time.     Coordination: Coordination normal.  Psychiatric:        Behavior: Behavior normal.     ED Results / Procedures / Treatments   Labs (all labs ordered are listed, but only abnormal results are displayed) Labs Reviewed - No data to display  EKG None  Radiology No results found.  Procedures .Marland KitchenIncision and Drainage  Date/Time: 11/12/2019 10:05 AM Performed by: Jacqlyn Larsen, PA-C Authorized by: Jacqlyn Larsen, PA-C    Consent:    Consent obtained:  Verbal   Consent given by:  Patient   Risks discussed:  Bleeding, incomplete drainage, pain and infection   Alternatives discussed:  No treatment Location:  Type:  Abscess   Size:  2x2 cm area of fluctuance with surrounding induration   Location:  Anogenital   Anogenital location: Left buttock. Pre-procedure details:    Skin preparation:  Chloraprep Anesthesia (see MAR for exact dosages):    Anesthesia method:  Local infiltration   Local anesthetic:  Lidocaine 2% WITH epi Procedure type:    Complexity:  Complex Procedure details:    Incision types:  Single straight   Incision depth:  Dermal   Scalpel blade:  11   Wound management:  Probed and deloculated and irrigated with saline   Drainage:  Purulent and bloody   Drainage amount:  Moderate   Wound treatment:  Wound left open   Packing materials:  None Post-procedure details:    Patient tolerance of procedure:  Tolerated well, no immediate complications   (including critical care time)  Medications Ordered in ED Medications  famotidine (PEPCID) tablet 20 mg (20 mg Oral Given 11/12/19 0928)  diphenhydrAMINE (BENADRYL) capsule 25 mg (25 mg Oral Given 11/12/19 0929)  lidocaine-EPINEPHrine (XYLOCAINE W/EPI) 2 %-1:200000 (PF) injection 10 mL (10 mLs Infiltration Given 11/12/19 [id number])    ED Course  I have reviewed the triage vital signs and the nursing notes.  Pertinent labs & imaging results that were available during my care of the patient were reviewed by me and considered in my medical decision making (see chart for details).    MDM Rules/Calculators/A&P                      38 year old female presents with urticaria that began since yesterday, history of similar allergic reactions previously but she is not sure of the trigger, no new household products or medications, did eat [employer] for the first time in a long time, does have a known latex allergy, used a latex condom several days ago  but had no reaction or symptoms at that time.  She has no associated shortness of breath, facial swelling, posterior oropharynx is clear, she has had some improvement with Benadryl but symptoms seem to be returning and she is continually pruritic.  Will treat with Benadryl and Pepcid here in the ED.  Patient is also here with an abscess to the left buttock with a large area of surrounding induration, incision and drainage performed, because of this infection that is present I discussed with patient that I would like to avoid using steroids to help treat her hives, they have significantly improved here in the ED with Benadryl and Pepcid.  Patient tolerated incision and drainage well, will treat with doxycycline and have patient do warm soaks.  Wound care and return precautions discussed.  Patient expresses understanding and agreement with plan.  I have encouraged her to follow-up with an allergist regarding her urticaria.  Discharged home in good condition.  Final Clinical Impression(s) / ED Diagnoses Final diagnoses:  Urticaria  Abscess of buttock, left    Rx / DC Orders ED Discharge Orders         Ordered    doxycycline (VIBRAMYCIN) 100 MG capsule  2 times daily     11/12/19 1013           Jacqlyn Larsen, [place] 11/12/19 1014    Mesner, Corene Cornea, MD 11/12/19 1423

## 2019-11-12 NOTE — Discharge Instructions (Addendum)
Take Pepcid twice a day, you can also take Zyrtec twice daily to help treat your hives, you can use Benadryl as needed for breakthrough symptoms.  I would recommend that you follow-up with an allergy specialist.  Because you have an infection going on today wanted to avoid steroids for treatment of your hives.  For your abscess please take doxycycline twice daily with food on your stomach, do warm soaks 2-3 times a day to help promote healing, if you note worsening abscess, with increasing drainage, worsening swelling or pain return to the ED.

## 2019-11-29 ENCOUNTER — Telehealth: Payer: Self-pay

## 2019-11-29 NOTE — Telephone Encounter (Signed)
Called patient to do their pre-visit COVID screening.  Call went to voicemail. Unable to do prescreening.  

## 2019-11-30 ENCOUNTER — Ambulatory Visit
Admission: EM | Admit: 2019-11-30 | Discharge: 2019-11-30 | Disposition: A | Discharge status: Home / Self Care | Payer: Medicaid Other | Attending: Emergency Medicine | Admitting: Emergency Medicine

## 2019-11-30 ENCOUNTER — Ambulatory Visit: Payer: Medicaid Other | Admitting: Internal Medicine

## 2019-11-30 ENCOUNTER — Other Ambulatory Visit: Payer: Self-pay

## 2019-11-30 DIAGNOSIS — N760 Acute vaginitis: Secondary | ICD-10-CM | POA: Diagnosis not present

## 2019-11-30 DIAGNOSIS — Z7251 High risk heterosexual behavior: Secondary | ICD-10-CM

## 2019-11-30 MED ORDER — FLUCONAZOLE 200 MG PO TABS: 200.0000 mg | ORAL_TABLET | Freq: Once | ORAL | 0 refills | Status: AC

## 2019-11-30 NOTE — ED Triage Notes (Signed)
Pt c/o vaginal irritation, burning and odor since Monday after finishing her menstrual cycle. States is allergic to latex and used a latex condom last week. States same partner and uses condoms for birth control.

## 2019-11-30 NOTE — Discharge Instructions (Addendum)

## 2019-11-30 NOTE — ED Provider Notes (Signed)
[name]    CSN: [id number] Arrival date & time: 11/30/19  1114      History   Chief Complaint Chief Complaint  Patient presents with  . Vaginal Itching    HPI Hannah Fleming is a 39 y.o. female with history of genital herpes, BV, yeast presenting for vaginal irritation since Monday.  LMP 2/28: States after finishing her cycle she noticed some irritation and itching, so she tried Monistat on Monday.  States it helped with itching a little bit, though still reporting irritation.  Denying small, discharge, pelvic pain, abdominal or back pain, fever.  No urinary symptoms such as frequency, urgency.  Patient currently sexually active with 1 female partner, using condoms.   Past Medical History:  Diagnosis Date  . Genital herpes    no outbreak in 43yrs  . Infection    UTI  . No pertinent past medical history   . Trichomonas infection     Patient Active Problem List   Diagnosis Date Noted  . Prediabetes 06/21/2019  . Unspecified disorder of menstruation and other abnormal bleeding from female genital tract 12/28/2013  . Anxiety 02/07/2013  . Dysmenorrhea 02/07/2013    Past Surgical History:  Procedure Laterality Date  . DILATION AND CURETTAGE OF UTERUS    . INDUCED ABORTION    . WISDOM TOOTH EXTRACTION      OB History    Gravida  8   Para  3   Term  3   Preterm  0   AB  5   Living  3     SAB  1   TAB  3   Ectopic  0   Multiple  0   Live Births  3            Home Medications    Prior to Admission medications   Medication Sig Start Date End Date Taking? Authorizing Provider  cyclobenzaprine (FLEXERIL) 10 MG tablet Take 1 tablet (10 mg total) by mouth 2 (two) times daily as needed for muscle spasms. 10/04/19   Faustino Congress, NP  famotidine (PEPCID) 20 MG tablet Take 1 tablet (20 mg total) by mouth 2 (two) times daily. 03/20/19   Fulp, Cammie, MD  fluconazole (DIFLUCAN) 200 MG tablet Take 1 tablet (200 mg total) by mouth once  for 1 dose. May repeat in 72 hours if needed 11/30/19 11/30/19  Hall-Potvin, [place], PA-C  Multiple Vitamins-Minerals (MULTIVITAMIN ADULT) TABS Take 1 tablet by mouth daily.    [provider]  SUMAtriptan (IMITREX) 50 MG tablet Take 1 tablet (50 mg total) by mouth every 2 (two) hours as needed for migraine. May repeat in 2 hours if headache persists or recurs. 09/12/19   Nicolette Bang, DO    Family History Family History  Problem Relation Age of Onset  . Healthy Mother   . Healthy Father   . Other Neg Hx     Social History Social History   Tobacco Use  . Smoking status: Former Smoker    Types: Cigarettes    Quit date: 02/07/2009    Years since quitting: 10.8  . Smokeless tobacco: Never Used  Substance Use Topics  . Alcohol use: Not Currently    Alcohol/week: 0.0 standard drinks    Comment: occasional  . Drug use: No     Allergies   Prilosec otc [omeprazole magnesium] and Latex   Review of Systems As per HPI   Physical Exam Triage Vital Signs ED Triage Vitals  Enc  Vitals Group     BP      Pulse      Resp      Temp      Temp src      SpO2      Weight      Height      Head Circumference      Peak Flow      Pain Score      Pain Loc      Pain Edu?      Excl. in [place]?    No data found.  Updated Vital Signs BP 123/83 (BP Location: Left Arm)   Pulse 76   Temp 98.5 F (36.9 C) (Oral)   Resp 16   LMP 11/19/2019   SpO2 96%   Visual Acuity Right Eye Distance:   Left Eye Distance:   Bilateral Distance:    Right Eye Near:   Left Eye Near:    Bilateral Near:     Physical Exam Constitutional:      General: She is not in acute distress. HENT:     Head: Normocephalic and atraumatic.  Eyes:     General: No scleral icterus.    Pupils: Pupils are equal, round, and reactive to light.  Cardiovascular:     Rate and Rhythm: Normal rate.  Pulmonary:     Effort: Pulmonary effort is normal.  Genitourinary:    General: Normal vulva.      Exam position: Supine.     Pubic Area: No rash or pubic lice.      Tanner stage (genital): 5.     Labia:        Right: Tenderness and lesion present. No rash.        Left: No rash or tenderness.      Urethra: No prolapse or urethral swelling.     Vagina: No foreign body. Vaginal discharge present. No erythema.     Cervix: Normal.          Comments: Mild amount of cottage cheese discharge.  Bimanual deferred Skin:    Coloration: Skin is not jaundiced or pale.  Neurological:     Mental Status: She is alert and oriented to person, place, and time.      UC Treatments / Results  Labs (all labs ordered are listed, but only abnormal results are displayed) Labs Reviewed  CERVICOVAGINAL ANCILLARY ONLY    EKG   Radiology No results found.  Procedures Procedures (including critical care time)  Medications Ordered in UC Medications - No data to display  Initial Impression / Assessment and Plan / UC Course  I have reviewed the triage vital signs and the nursing notes.  Pertinent labs & imaging results that were available during my care of the patient were reviewed by me and considered in my medical decision making (see chart for details).     Patient without significant pain or urinary symptoms.  Does have unprotected sex and history of BV, though states this feels similar: We will await cytology prior to treating thereof.  H&P concerning for acute vaginitis section taken data: Diflucan sent.  Return precautions discussed, patient verbalized understanding and is agreeable to plan. Final Clinical Impressions(s) / UC Diagnoses   Final diagnoses:  Acute vaginitis  Unprotected sex   Discharge Instructions   None    ED Prescriptions    Medication Sig Dispense Auth. Provider   fluconazole (DIFLUCAN) 200 MG tablet Take 1 tablet (200 mg total) by mouth once for 1  dose. May repeat in 72 hours if needed 2 tablet Hall-Potvin, [place], PA-C     PDMP not reviewed this  encounter.   Hall-Potvin, [place], [place] 11/30/19 1311

## 2019-12-01 ENCOUNTER — Telehealth (HOSPITAL_COMMUNITY): Payer: Self-pay

## 2019-12-01 LAB — CERVICOVAGINAL ANCILLARY ONLY
Bacterial Vaginitis (gardnerella): POSITIVE — AB
Chlamydia: NEGATIVE
Comment: NEGATIVE
Comment: NEGATIVE
Comment: NEGATIVE
Comment: NORMAL
Neisseria Gonorrhea: NEGATIVE
Trichomonas: NEGATIVE

## 2019-12-01 MED ORDER — METRONIDAZOLE 500 MG PO TABS: 500.0000 mg | ORAL_TABLET | Freq: Two times a day (BID) | ORAL | 0 refills | Status: DC

## 2019-12-05 ENCOUNTER — Telehealth: Payer: Self-pay

## 2019-12-05 NOTE — Patient Instructions (Signed)

## 2019-12-05 NOTE — Telephone Encounter (Signed)

## 2019-12-06 ENCOUNTER — Encounter: Payer: Self-pay | Admitting: Internal Medicine

## 2019-12-06 ENCOUNTER — Ambulatory Visit (INDEPENDENT_AMBULATORY_CARE_PROVIDER_SITE_OTHER): Payer: Medicaid Other | Admitting: Internal Medicine

## 2019-12-06 VITALS — BP 118/78 | HR 79 | Temp 97.5°F | Resp 17 | Wt 189.0 lb

## 2019-12-06 DIAGNOSIS — B9689 Other specified bacterial agents as the cause of diseases classified elsewhere: Secondary | ICD-10-CM | POA: Diagnosis not present

## 2019-12-06 DIAGNOSIS — N760 Acute vaginitis: Secondary | ICD-10-CM | POA: Diagnosis not present

## 2019-12-06 DIAGNOSIS — N946 Dysmenorrhea, unspecified: Secondary | ICD-10-CM | POA: Diagnosis not present

## 2019-12-06 DIAGNOSIS — Z0000 Encounter for general adult medical examination without abnormal findings: Secondary | ICD-10-CM

## 2019-12-06 DIAGNOSIS — Z131 Encounter for screening for diabetes mellitus: Secondary | ICD-10-CM | POA: Diagnosis not present

## 2019-12-06 MED ORDER — METRONIDAZOLE 0.75 % VA GEL: 1.0000 | Freq: Two times a day (BID) | VAGINAL | 0 refills | Status: DC

## 2019-12-06 MED ORDER — ACETAMINOPHEN-CODEINE 300-30 MG PO TABS: 1.0000 | ORAL_TABLET | ORAL | 2 refills | Status: DC | PRN

## 2019-12-06 NOTE — Progress Notes (Signed)
Subjective:    Hannah Fleming - 39 y.o. female MRN [id number]  Date of birth: 07/24/1981  HPI  Hannah Fleming is here for annual exam. She is UTD on health maintenance topics.   Has concerns about dysmenorrhea. Reports this has been chronic. Was previously on Tylenol #3 prescribed by Dr. Jodi Mourning. She is taking NSAIDs in days prior to menses and during heaviest days of cycles. She has used OCPs in the past to control bleeding/cramping. However, her and her future husband plan to conceive so wants to avoid hormonal contraception.    Health Maintenance:  There are no preventive care reminders to display for this patient.  -  reports that she quit smoking about 10 years ago. Her smoking use included cigarettes. She has never used smokeless tobacco. - Review of Systems: Per HPI. - Past Medical History: Patient Active Problem List   Diagnosis Date Noted  . Prediabetes 06/21/2019  . Unspecified disorder of menstruation and other abnormal bleeding from female genital tract 12/28/2013  . Anxiety 02/07/2013  . Dysmenorrhea 02/07/2013   - Medications: reviewed and updated   Objective:   Physical Exam Temp (!) 97.5 F (36.4 C) (Temporal)   Resp 17   Wt 189 lb (85.7 kg)   LMP 11/19/2019   BMI 31.45 kg/m  Physical Exam  Constitutional: She is oriented to person, place, and time and well-developed, well-nourished, and in no distress.  HENT:  Head: Normocephalic and atraumatic.  Mouth/Throat: Oropharynx is clear and moist.  Eyes: Pupils are equal, round, and reactive to light. Conjunctivae and EOM are normal.  Neck: No thyromegaly present.  Cardiovascular: Normal rate, regular rhythm, normal heart sounds and intact distal pulses.  No murmur heard. Pulmonary/Chest: Effort normal and breath sounds normal. No respiratory distress. She has no wheezes.  Abdominal: Soft. Bowel sounds are normal. She exhibits no distension. There is no abdominal tenderness. There is no rebound and no  guarding.  Musculoskeletal:        General: No deformity or edema. Normal range of motion.     Cervical back: Normal range of motion and neck supple.  Lymphadenopathy:    She has no cervical adenopathy.  Neurological: She is alert and oriented to person, place, and time. Gait normal.  Skin: Skin is warm and dry. No rash noted. She is not diaphoretic.  Psychiatric: Mood, affect and judgment normal.           Assessment & Plan:   1. Annual physical exam - CBC with Differential - Comprehensive metabolic panel - Lipid Panel  2. Bacterial vaginosis Given patient fasting for religious reasons and having difficulty tolerating PO flagyl for recently diagnosed BV infection at Urgent Care, will send in topical PV treatment.  - metroNIDAZOLE (METROGEL VAGINAL) 0.75 % vaginal gel; Place 1 Applicatorful vaginally 2 (two) times daily.  Dispense: 70 g; Refill: 0  3. Screening for diabetes mellitus (DM) - Hemoglobin A1c  4. Dysmenorrhea Discussed various options for managing pain with heavy menstrual cycles including NSAIDs, OCPs, IUDs, ablation, and hysterectomy. Given patient plans to try to conceive within the next year, she would like to avoid any medications or procedures that could interfere with conception. For now, will prescribe Tylenol #3 and continue to use NSAIDs for menstrual cycles. Patient plans for contraception or surgical options in the future when fertility is not desired.  - Acetaminophen-Codeine (TYLENOL/CODEINE #3) 300-30 MG tablet; Take 1 tablet by mouth every 4 (four) hours as needed for pain (with menstrual cycles).  Dispense: 20 tablet; Refill: [place], D.O. 12/06/2019, 11:05 AM Primary Care at [facility]

## 2019-12-07 LAB — COMPREHENSIVE METABOLIC PANEL
ALT: 11 IU/L (ref 0–32)
AST: 15 IU/L (ref 0–40)
Albumin/Globulin Ratio: 1.8 (ref 1.2–2.2)
Albumin: 4.4 g/dL (ref 3.8–4.8)
Alkaline Phosphatase: 60 IU/L (ref 39–117)
BUN/Creatinine Ratio: 18 (ref 9–23)
BUN: 14 mg/dL (ref 6–20)
Bilirubin Total: 0.3 mg/dL (ref 0.0–1.2)
CO2: 23 mmol/L (ref 20–29)
Calcium: 9.1 mg/dL (ref 8.7–10.2)
Chloride: 104 mmol/L (ref 96–106)
Creatinine, Ser: 0.8 mg/dL (ref 0.57–1.00)
GFR calc Af Amer: 108 mL/min/{1.73_m2} (ref >59)
GFR calc non Af Amer: 94 mL/min/{1.73_m2} (ref >59)
Globulin, Total: 2.4 g/dL (ref 1.5–4.5)
Glucose: 91 mg/dL (ref 65–99)
Potassium: 4.2 mmol/L (ref 3.5–5.2)
Sodium: 140 mmol/L (ref 134–144)
Total Protein: 6.8 g/dL (ref 6.0–8.5)

## 2019-12-07 LAB — CBC WITH DIFFERENTIAL/PLATELET
Basophils Absolute: 0 10*3/uL (ref 0.0–0.2)
Basos: 1 %
EOS (ABSOLUTE): 0.1 10*3/uL (ref 0.0–0.4)
Eos: 2 %
Hematocrit: 40.2 % (ref 34.0–46.6)
Hemoglobin: 13.1 g/dL (ref 11.1–15.9)
Immature Grans (Abs): 0 10*3/uL (ref 0.0–0.1)
Immature Granulocytes: 0 %
Lymphocytes Absolute: 2.3 10*3/uL (ref 0.7–3.1)
Lymphs: 27 %
MCH: 29.4 pg (ref 26.6–33.0)
MCHC: 32.6 g/dL (ref 31.5–35.7)
MCV: 90 fL (ref 79–97)
Monocytes Absolute: 0.5 10*3/uL (ref 0.1–0.9)
Monocytes: 5 %
Neutrophils Absolute: 5.4 10*3/uL (ref 1.4–7.0)
Neutrophils: 65 %
Platelets: 298 10*3/uL (ref 150–450)
RBC: 4.45 x10E6/uL (ref 3.77–5.28)
RDW: 12.6 % (ref 11.7–15.4)
WBC: 8.3 10*3/uL (ref 3.4–10.8)

## 2019-12-07 LAB — LIPID PANEL
Chol/HDL Ratio: 2.9 ratio (ref 0.0–4.4)
Cholesterol, Total: 162 mg/dL (ref 100–199)
HDL: 56 mg/dL (ref >39)
LDL Chol Calc (NIH): 95 mg/dL (ref 0–99)
Triglycerides: 52 mg/dL (ref 0–149)
VLDL Cholesterol Cal: 11 mg/dL (ref 5–40)

## 2019-12-07 LAB — HEMOGLOBIN A1C
Est. average glucose Bld gHb Est-mCnc: 117 mg/dL
Hgb A1c MFr Bld: 5.7 % — ABNORMAL HIGH (ref 4.8–5.6)

## 2019-12-08 NOTE — Progress Notes (Signed)
Patient notified of results & recommendations. Expressed understanding.

## 2019-12-14 ENCOUNTER — Ambulatory Visit (INDEPENDENT_AMBULATORY_CARE_PROVIDER_SITE_OTHER): Payer: Medicaid Other | Admitting: Allergy

## 2019-12-14 ENCOUNTER — Encounter: Payer: Self-pay | Admitting: Allergy

## 2019-12-14 ENCOUNTER — Other Ambulatory Visit: Payer: Self-pay

## 2019-12-14 VITALS — BP 114/80 | HR 67 | Temp 98.3°F | Resp 16 | Ht 63.5 in | Wt 192.8 lb

## 2019-12-14 DIAGNOSIS — T783XXD Angioneurotic edema, subsequent encounter: Secondary | ICD-10-CM | POA: Diagnosis not present

## 2019-12-14 DIAGNOSIS — K2100 Gastro-esophageal reflux disease with esophagitis, without bleeding: Secondary | ICD-10-CM

## 2019-12-14 DIAGNOSIS — L508 Other urticaria: Secondary | ICD-10-CM

## 2019-12-14 DIAGNOSIS — J3089 Other allergic rhinitis: Secondary | ICD-10-CM | POA: Diagnosis not present

## 2019-12-14 DIAGNOSIS — T65811D Toxic effect of latex, accidental (unintentional), subsequent encounter: Secondary | ICD-10-CM | POA: Diagnosis not present

## 2019-12-14 NOTE — Patient Instructions (Addendum)
Hives and swelling  - at this time etiology of hives and swelling is unknown.  Hives can be caused by a variety of different triggers including illness/infection, foods, medications, stings, exercise, pressure, vibrations, extremes of temperature to name a few however majority of the time there is no identifiable trigger.  Your symptoms have been ongoing for >6 weeks making this chronic thus will obtain labwork to evaluate: tryptase, hive panel, environmental panel, alpha-gal panel, inflammatory markers  - your CBC (blood cell counts) and CMP (liver/kidney functions) were normal from 11/2019  - if hives/swelling return recommend taking either Zyrtec 10mg , Allegra 180mg  or Xyzal 5mg  twice a day with Pepcid 20mg  twice a day  - discussed step-wise approach to managing hives if initial antihistamine regimen is not effective enough  - environmental allergy skin testing is positive to grass pollens, dust mites, cat and candida (skin flora yeast).    Allergies  - allergen avoidance measures to environmental allergens as above  - for nasal congestion/drainage can use over-the-counter nasal spray like Flonase, Rhinocort, Nasacort 2 sprays each nostril daily as needed for 1-2 weeks at a time before stopping once symptoms improve.    - long-acting antihistamine as above as needed  Latex allergy  - avoid latex materials  - symptoms following latex exposure of confirmation of latex allergy  Reflux  - continue avoidance of foods that worse reflux symptoms  - Pepcid as above   Follow-up 3-6 months or sooner if needed

## 2019-12-14 NOTE — Progress Notes (Signed)
New Patient Note  RE: Hannah Fleming MRN: [id number] DOB: Feb 20, 1981 Date of Office Visit: 12/14/2019  Referring provider: No ref. provider found Primary care provider: Nicolette Bang, DO  Chief Complaint: hives  History of present illness: Hannah Fleming is a 39 y.o. female presenting today for evaluation of hives.    She has been having hives that started summer 2021.  She states having episodes of hives last summer on 2 occasions that was severe enough that she needed to go to ED.   She states her hands and feet start itching first, then face will start to itch.  Then she will notice the welts all over.   The individual hives resolve in 24 hrs and does not leave any marks/bruising once resolved.  No joint aches/pains related to hives.  No fevers.  She has had lip swelling with the hives.  Benadryl does help.  She had a abscess on her buttocks that required draining prior to her developing hives.  She also states the day the hives started she had eaten at [facility] about 30 minutes prior she had a chicken [place] that she states contained chicken, the pita bread, sour cream, cheese, lettuce and tomato.  She has had all of these food products in different dishes since without any problems.   She did present to the ED for hives on 11/12/19 where on exam she was noted to have "no facial swelling, posterior oropharynx was clear with no edema, a 2 x 2 centimeter area of fluctuance over the right buttock just distal to the gluteal cleft with 3 cm of surrounding induration and erythema no expressible drainage.  This area does not appear to track towards the rectum.  Scattered urticaria over the upper and lower extremities, stomach and back." She did have an I&D performed of this abscess.  She was treated in the ED with Pepcid, Benadryl and Xylocaine with epi for the I&D.  She was discharged home to take doxycycline.  She states latex condoms would cause her to itch and having   swelling in vaginal area.  She changed to non-latex condoms or no protection but is trying not to conceive.    She states she lived in a home for 9 years with mold.  She has moved from this dwelling.  She states she would get occasional nasal congestion and cough while in this home.    She does have heartburn and does her best to avoid her triggering foods.  She will use pepcid as needed for relief of heartburn.     Review of systems: Review of Systems  Constitutional: Negative.   HENT: Negative.   Eyes: Negative.   Respiratory: Negative.   Cardiovascular: Negative.   Gastrointestinal: Positive for heartburn.  Musculoskeletal: Negative.   Skin: Positive for itching and rash.  Neurological: Negative.      All other systems negative unless noted above in HPI  Past medical history: Past Medical History:  Diagnosis Date  . Genital herpes    no outbreak in 80yrs  . Infection    UTI  . No pertinent past medical history   . Trichomonas infection   . Urticaria     Past surgical history: Past Surgical History:  Procedure Laterality Date  . DILATION AND CURETTAGE OF UTERUS    . INDUCED ABORTION    . WISDOM TOOTH EXTRACTION      Family history:  Family History  Problem Relation Age of Onset  . Healthy  Mother   . Healthy Father   . Other Neg Hx     Social history: She lives in a home with carpeting in the bedroom with electric heating and central cooling.  Dog in the home.  There is no concern for water damage, mildew or roaches in the home.  She works in Surveyor, quantity.  There is no reported smoking history.  Medication List: Current Outpatient Medications  Medication Sig Dispense Refill  . Acetaminophen-Codeine (TYLENOL/CODEINE #3) 300-30 MG tablet Take 1 tablet by mouth every 4 (four) hours as needed for pain (with menstrual cycles). 20 tablet 2  . famotidine (PEPCID) 20 MG tablet Take 1 tablet (20 mg total) by mouth 2 (two) times daily. 60 tablet 5  . metroNIDAZOLE  (METROGEL VAGINAL) 0.75 % vaginal gel Place 1 Applicatorful vaginally 2 (two) times daily. 70 g 0  . Multiple Vitamins-Minerals (MULTIVITAMIN ADULT) TABS Take 1 tablet by mouth daily.    . metroNIDAZOLE (FLAGYL) 500 MG tablet Take 1 tablet (500 mg total) by mouth 2 (two) times daily. (Patient not taking: Reported on 12/14/2019) 14 tablet 0  . SUMAtriptan (IMITREX) 50 MG tablet Take 1 tablet (50 mg total) by mouth every 2 (two) hours as needed for migraine. May repeat in 2 hours if headache persists or recurs. (Patient not taking: Reported on 12/14/2019) 10 tablet 0   No current facility-administered medications for this visit.    Known medication allergies: Allergies  Allergen Reactions  . Prilosec Otc [Omeprazole Magnesium] Hives  . Latex Itching and Rash     Physical examination: Blood pressure 114/80, pulse 67, temperature 98.3 F (36.8 C), temperature source Temporal, resp. rate 16, height 5' 3.5" (1.613 m), weight 192 lb 12.8 oz (87.5 kg), last menstrual period 11/19/2019, SpO2 99 %.  General: Alert, interactive, in no acute distress. HEENT: PERRLA, TMs pearly gray, turbinates non-edematous without discharge, post-pharynx non erythematous. Neck: Supple without lymphadenopathy. Lungs: Clear to auscultation without wheezing, rhonchi or rales. {no increased work of breathing. CV: Normal S1, S2 without murmurs. Abdomen: Nondistended, nontender. Skin: Warm and dry, without lesions or rashes. Extremities:  No clubbing, cyanosis or edema. Neuro:   Grossly intact.  Diagnositics/Labs: Labs:  Component     Latest Ref Rng & Units 12/06/2019  WBC     3.4 - 10.8 x10E3/uL 8.3  RBC     3.77 - 5.28 x10E6/uL 4.45  Hemoglobin     11.1 - 15.9 g/dL 13.1  HCT     34.0 - 46.6 % 40.2  MCV     79 - 97 fL 90  MCH     26.6 - 33.0 pg 29.4  MCHC     31.5 - 35.7 g/dL 32.6  RDW     11.7 - 15.4 % 12.6  Platelets     150 - 450 x10E3/uL 298  Neutrophils     Not Estab. % 65  Lymphs     Not  Estab. % 27  Monocytes     Not Estab. % 5  Eos     Not Estab. % 2  Basos     Not Estab. % 1  NEUT#     1.4 - 7.0 x10E3/uL 5.4  Lymphocyte #     0.7 - 3.1 x10E3/uL 2.3  Monocytes Absolute     0.1 - 0.9 x10E3/uL 0.5  EOS (ABSOLUTE)     0.0 - 0.4 x10E3/uL 0.1  Basophils Absolute     0.0 - 0.2 x10E3/uL 0.0  Immature Granulocytes  Not Estab. % 0  Immature Grans (Abs)     0.0 - 0.1 x10E3/uL 0.0  Glucose     65 - 99 mg/dL 91  BUN     6 - 20 mg/dL 14  Creatinine     0.57 - 1.00 mg/dL 0.80  GFR, Est Non African American     >59 mL/min/1.73 94  GFR, Est African American     >59 mL/min/1.73 108  BUN/Creatinine Ratio     9 - 23 18  Sodium     134 - 144 mmol/L 140  Potassium     3.5 - 5.2 mmol/L 4.2  Chloride     96 - 106 mmol/L 104  CO2     20 - 29 mmol/L 23  Calcium     8.7 - 10.2 mg/dL 9.1  Total Protein     6.0 - 8.5 g/dL 6.8  Albumin     3.8 - 4.8 g/dL 4.4  Globulin, Total     1.5 - 4.5 g/dL 2.4  Albumin/Globulin Ratio     1.2 - 2.2 1.8  Total Bilirubin     0.0 - 1.2 mg/dL 0.3  Alkaline Phosphatase     39 - 117 IU/L 60  AST     0 - 40 IU/L 15  ALT     0 - 32 IU/L 11    Allergy testing: Environmental allergy skin prick testing is positive to [place], [place] blue, meadow fescue, perennial rye, sweet Vernal, Timothy, Candida albicans, both dust mites, cat. 10 top, food allergen skin prick testing is negative. Allergy testing results were read and interpreted by provider, documented by clinical staff.   Assessment and plan: Urticaria with angioedema  - at this time etiology of hives and swelling is unknown.  Hives can be caused by a variety of different triggers including illness/infection, foods, medications, stings, exercise, pressure, vibrations, extremes of temperature to name a few however majority of the time there is no identifiable trigger.  Your symptoms have been ongoing for >6 weeks making this chronic thus will obtain labwork to evaluate:  tryptase, hive panel, environmental panel, alpha-gal panel, inflammatory markers  - your CBC (blood cell counts) and CMP (liver/kidney functions) were normal from 11/2019  - if hives/swelling return recommend taking either Zyrtec 10mg , Allegra 180mg  or Xyzal 5mg  twice a day with Pepcid 20mg  twice a day  - discussed step-wise approach to managing hives if initial antihistamine regimen is not effective enough  - environmental allergy skin testing is positive to grass pollens, dust mites, cat and candida (skin flora yeast).    Rhinitis, presumed allergic  - allergen avoidance measures to environmental allergens as above  - for nasal congestion/drainage can use over-the-counter nasal spray like Flonase, Rhinocort, Nasacort 2 sprays each nostril daily as needed for 1-2 weeks at a time before stopping once symptoms improve.    - long-acting antihistamine as above as needed  Latex allergy  - avoid latex materials  - symptoms following latex exposure of confirmation of latex allergy  GERD  - continue avoidance of foods that worse reflux symptoms  - Pepcid as above   Follow-up 3-6 months or sooner if needed  I appreciate the opportunity to take part in [place] care. Please do not hesitate to contact me with questions.  Sincerely,   Prudy Feeler, MD Allergy/Immunology [facility] [place]

## 2019-12-20 LAB — ANA W/REFLEX: Anti Nuclear Antibody (ANA): POSITIVE — AB

## 2019-12-20 LAB — THYROID ANTIBODIES
Thyroglobulin Antibody: <1 IU/mL (ref 0.0–0.9)
Thyroperoxidase Ab SerPl-aCnc: 198 IU/mL — ABNORMAL HIGH (ref 0–34)

## 2019-12-20 LAB — IGE+ALLERGENS ZONE 2(30)
Alternaria Alternata IgE: <0.1 kU/L
Amer Sycamore IgE Qn: <0.1 kU/L
Aspergillus Fumigatus IgE: <0.1 kU/L
Bahia Grass IgE: <0.1 kU/L
Bermuda Grass IgE: <0.1 kU/L
Cat Dander IgE: 0.28 kU/L — AB
Cedar, Mountain IgE: <0.1 kU/L
Cladosporium Herbarum IgE: <0.1 kU/L
Cockroach, American IgE: 0.11 kU/L — AB
Common Silver Birch IgE: <0.1 kU/L
D Farinae IgE: 1.89 kU/L — AB
D Pteronyssinus IgE: 2.55 kU/L — AB
Dog Dander IgE: 0.23 kU/L — AB
Elm, American IgE: <0.1 kU/L
Hickory, White IgE: <0.1 kU/L
IgE (Immunoglobulin E), Serum: 83 IU/mL (ref 6–495)
Johnson Grass IgE: <0.1 kU/L
Maple/Box Elder IgE: <0.1 kU/L
Mucor Racemosus IgE: <0.1 kU/L
Mugwort IgE Qn: <0.1 kU/L
Nettle IgE: <0.1 kU/L
Oak, White IgE: <0.1 kU/L
Penicillium Chrysogen IgE: <0.1 kU/L
Pigweed, Rough IgE: <0.1 kU/L
Plantain, English IgE: <0.1 kU/L
Ragweed, Short IgE: <0.1 kU/L
Sheep Sorrel IgE Qn: <0.1 kU/L
Stemphylium Herbarum IgE: <0.1 kU/L
Sweet gum IgE RAST Ql: <0.1 kU/L
Timothy Grass IgE: 1.45 kU/L — AB
White Mulberry IgE: <0.1 kU/L

## 2019-12-20 LAB — SEDIMENTATION RATE: Sed Rate: 19 mm/hr (ref 0–32)

## 2019-12-20 LAB — ENA+DNA/DS+SJORGEN'S
ENA RNP Ab: 2.5 AI — ABNORMAL HIGH (ref 0.0–0.9)
ENA SM Ab Ser-aCnc: <0.2 AI (ref 0.0–0.9)
ENA SSA (RO) Ab: <0.2 AI (ref 0.0–0.9)
ENA SSB (LA) Ab: <0.2 AI (ref 0.0–0.9)
dsDNA Ab: <1 IU/mL (ref 0–9)

## 2019-12-20 LAB — TRYPTASE: Tryptase: 2.7 ug/L (ref 2.2–13.2)

## 2019-12-20 LAB — CHRONIC URTICARIA: cu index: 4.9 (ref <10)

## 2019-12-29 ENCOUNTER — Ambulatory Visit: Payer: Medicaid Other

## 2019-12-29 ENCOUNTER — Ambulatory Visit: Payer: Medicaid Other | Attending: Internal Medicine

## 2019-12-29 DIAGNOSIS — Z20822 Contact with and (suspected) exposure to covid-19: Secondary | ICD-10-CM | POA: Diagnosis not present

## 2019-12-30 LAB — NOVEL CORONAVIRUS, NAA: SARS-CoV-2, NAA: NOT DETECTED

## 2019-12-30 LAB — SARS-COV-2, NAA 2 DAY TAT

## 2020-02-19 ENCOUNTER — Ambulatory Visit: Payer: Medicaid Other | Attending: Internal Medicine

## 2020-02-19 DIAGNOSIS — Z20822 Contact with and (suspected) exposure to covid-19: Secondary | ICD-10-CM | POA: Diagnosis not present

## 2020-02-20 LAB — NOVEL CORONAVIRUS, NAA: SARS-CoV-2, NAA: DETECTED — AB

## 2020-02-20 LAB — SARS-COV-2, NAA 2 DAY TAT

## 2020-02-21 ENCOUNTER — Telehealth: Payer: Self-pay | Admitting: Internal Medicine

## 2020-02-21 ENCOUNTER — Telehealth: Payer: Self-pay | Admitting: Adult Health

## 2020-02-21 ENCOUNTER — Telehealth: Payer: Self-pay | Admitting: Infectious Diseases

## 2020-02-21 NOTE — Telephone Encounter (Signed)
Schedule a virtual visit with the provider.

## 2020-02-21 NOTE — Telephone Encounter (Signed)
Patient tested positive for COVID 19, called and talked with her.  She has mild symptoms.  I reviewed with her that she is eligible for monoclonal antibody therapy with Regeneron as she meets criteria of being in an at risk group for complications and or hospitalization from [place] due to her BMI greater than 25.   Kryslyn notes she hasn't thought much about receiving the infusion.  She says her son was hospitalized over the last weekend with COVID and is in the ICU.  She notes that she has been more focused on him.  Her symptoms began about 2-3 days ago.  I reviewed with her the information about the therapy.  She would like to research it and will call us back if she wishes to receive it.  Wilber Bihari, NP

## 2020-02-21 NOTE — Telephone Encounter (Signed)
Pt has covid and wants to know what to do. Want to know what meds to take son is in ciu with covid 23

## 2020-02-21 NOTE — Telephone Encounter (Signed)
Called to discuss with patient about Covid symptoms and the use of Regeneron, a monoclonal antibody infusion for those with mild to moderate Covid symptoms and at a high risk of hospitalization.  Pt is qualified for this infusion at the [facility] infusion center due to [employer].    Message left to call back - MyChart message sent as well.    Janene Madeira, MSN, NP-C [facility] [email or web address] [facility] Main Line: [phone]

## 2020-02-29 ENCOUNTER — Telehealth (INDEPENDENT_AMBULATORY_CARE_PROVIDER_SITE_OTHER): Payer: Medicaid Other | Admitting: Internal Medicine

## 2020-02-29 DIAGNOSIS — U071 COVID-19: Secondary | ICD-10-CM | POA: Diagnosis not present

## 2020-02-29 NOTE — Progress Notes (Signed)
Virtual Visit via Telephone Note  I connected with Hannah Fleming, on 02/29/2020 at 8:39 AM by telephone due to the COVID-19 pandemic and verified that I am speaking with the correct person using two identifiers.   Consent: I discussed the limitations, risks, security and privacy concerns of performing an evaluation and management service by telephone and the availability of in person appointments. I also discussed with the patient that there may be a patient responsible charge related to this service. The patient expressed understanding and agreed to proceed.   Location of Patient: Home   Location of Provider: Clinic    Persons participating in Telemedicine visit: Larrissa Stivers [facility] Dr. Juleen China    History of Present Illness: Patient has a visit to follow up on COVID 19 infection. She was diagnosed on 6/28. She reports that now every day is different. Today, she feels fine currently. Yesterday she had a sore throat and a "light headache". Afebrile this entire time. No SOB or increased WOB. Has not been able to return to work yet. 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 She was contacted by ID regarding monoclonal Ab infusion. Patient reports that she does not feel like her symptoms are severe enough to warrant that type of treatment. Patient says she is trying hard to stay well hydrated. Has been taking Ibuprofen for the headaches. No underlying lung disease.    Past Medical History:  Diagnosis Date  . Allergy    Phreesia 02/29/2020  . Genital herpes    no outbreak in 39yrs  . Infection    UTI  . No pertinent past medical history   . Trichomonas infection   . Urticaria    Allergies  Allergen Reactions  . Prilosec Otc [Omeprazole Magnesium] Hives  . Latex Itching and Rash    Current Outpatient Medications on File Prior to Visit  Medication Sig Dispense Refill  . Acetaminophen-Codeine (TYLENOL/CODEINE #3) 300-30 MG tablet Take 1 tablet by mouth every  4 (four) hours as needed for pain (with menstrual cycles). 20 tablet 2  . Multiple Vitamins-Minerals (MULTIVITAMIN ADULT) TABS Take 1 tablet by mouth daily.     No current facility-administered medications on file prior to visit.    Observations/Objective: NAD. Speaking clearly.  Work of breathing normal.  Alert and oriented. Mood appropriate.   Assessment and Plan: 1. COVID-19 virus infection Recovering well. Still slightly symptomatic. Discussed supportive care options at home. Emphasized rest and hydration. Discussed reasons to seek emergency care. Adhere to 3W's. Vaccination recommended when appropriate.   Follow Up Instructions: PRN and for routine medical care    I discussed the assessment and treatment plan with the patient. The patient was provided an opportunity to ask questions and all were answered. The patient agreed with the plan and demonstrated an understanding of the instructions.   The patient was advised to call back or seek an in-person evaluation if the symptoms worsen or if the condition fails to improve as anticipated.     I provided 20 minutes total of non-face-to-face time during this encounter including median intraservice time, reviewing previous notes, investigations, ordering medications, medical decision making, coordinating care and patient verbalized understanding at the end of the visit.    Phill Myron, D.O. Primary Care at [facility]  02/29/2020, 8:39 AM

## 2020-03-05 ENCOUNTER — Telehealth: Payer: Medicaid Other | Admitting: Internal Medicine

## 2020-06-04 ENCOUNTER — Encounter: Payer: Self-pay | Admitting: Internal Medicine

## 2020-06-07 ENCOUNTER — Telehealth (INDEPENDENT_AMBULATORY_CARE_PROVIDER_SITE_OTHER): Payer: Medicaid Other | Admitting: Internal Medicine

## 2020-06-07 ENCOUNTER — Encounter: Payer: Self-pay | Admitting: Internal Medicine

## 2020-06-07 DIAGNOSIS — N946 Dysmenorrhea, unspecified: Secondary | ICD-10-CM

## 2020-06-07 NOTE — Progress Notes (Signed)
Virtual Visit via Telephone Note  I connected with Hannah Fleming, on 06/07/2020 at 10:51 AM by telephone due to the COVID-19 pandemic and verified that I am speaking with the correct person using two identifiers.   Consent: I discussed the limitations, risks, security and privacy concerns of performing an evaluation and management service by telephone and the availability of in person appointments. I also discussed with the patient that there may be a patient responsible charge related to this service. The patient expressed understanding and agreed to proceed.   Location of Patient: Home   Location of Provider: Clinic    Persons participating in Telemedicine visit: Alacia Rehmann [facility] Dr. Juleen China    History of Present Illness: Patient has a visit to f/u on dysmenorrhea. Reports that menses she had in Sept was the worst with cramping. She took Tylenol #3 and did not relieve pain. Reports that typically she is able to take the medication the first 2-3 days of the period and it will control her pain. Using heating pad as well. Patient still plans to conceive in the near future. Had some spotting about a week ago and was due to start her menstrual cycle today but reports she has not had any PMS like symptoms.    Past Medical History:  Diagnosis Date  . Allergy    Phreesia 02/29/2020  . Genital herpes    no outbreak in 46yrs  . Infection    UTI  . No pertinent past medical history   . Trichomonas infection   . Urticaria    Allergies  Allergen Reactions  . Prilosec Otc [Omeprazole Magnesium] Hives  . Latex Itching and Rash    Current Outpatient Medications on File Prior to Visit  Medication Sig Dispense Refill  . Acetaminophen-Codeine (TYLENOL/CODEINE #3) 300-30 MG tablet Take 1 tablet by mouth every 4 (four) hours as needed for pain (with menstrual cycles). 20 tablet 2  . Multiple Vitamins-Minerals (MULTIVITAMIN ADULT) TABS Take 1 tablet by mouth daily.      No current facility-administered medications on file prior to visit.    Observations/Objective: NAD. Speaking clearly.  Work of breathing normal.  Alert and oriented. Mood appropriate.   Assessment and Plan: 1. Dysmenorrhea Fairly well controlled with NSAID, Tylenol #3, heating pad. Encouraged patient to take a UPT given irregular spotting, which could theoretically be implantation bleeding, and lack of PMS for menses that was expected to occur today.     Follow Up Instructions: PRN and for routine medical care    I discussed the assessment and treatment plan with the patient. The patient was provided an opportunity to ask questions and all were answered. The patient agreed with the plan and demonstrated an understanding of the instructions.   The patient was advised to call back or seek an in-person evaluation if the symptoms worsen or if the condition fails to improve as anticipated.     I provided 14 minutes total of non-face-to-face time during this encounter including median intraservice time, reviewing previous notes, investigations, ordering medications, medical decision making, coordinating care and patient verbalized understanding at the end of the visit.    Phill Myron, D.O. Primary Care at [facility]  06/07/2020, 10:51 AM
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
## 2020-07-22 ENCOUNTER — Ambulatory Visit: Payer: Self-pay

## 2020-07-22 ENCOUNTER — Ambulatory Visit: Payer: Medicaid Other | Admitting: Rheumatology

## 2020-07-22 ENCOUNTER — Other Ambulatory Visit: Payer: Self-pay

## 2020-07-22 ENCOUNTER — Encounter: Payer: Self-pay | Admitting: Rheumatology

## 2020-07-22 VITALS — BP 125/92 | HR 79 | Resp 15 | Ht 63.5 in | Wt 198.4 lb

## 2020-07-22 DIAGNOSIS — M25562 Pain in left knee: Secondary | ICD-10-CM | POA: Diagnosis not present

## 2020-07-22 DIAGNOSIS — G8929 Other chronic pain: Secondary | ICD-10-CM | POA: Diagnosis not present

## 2020-07-22 DIAGNOSIS — M79671 Pain in right foot: Secondary | ICD-10-CM

## 2020-07-22 DIAGNOSIS — M545 Low back pain, unspecified: Secondary | ICD-10-CM | POA: Diagnosis not present

## 2020-07-22 DIAGNOSIS — R5383 Other fatigue: Secondary | ICD-10-CM | POA: Diagnosis not present

## 2020-07-22 DIAGNOSIS — M79672 Pain in left foot: Secondary | ICD-10-CM

## 2020-07-22 DIAGNOSIS — L509 Urticaria, unspecified: Secondary | ICD-10-CM

## 2020-07-22 DIAGNOSIS — U071 COVID-19: Secondary | ICD-10-CM | POA: Diagnosis not present

## 2020-07-22 DIAGNOSIS — R768 Other specified abnormal immunological findings in serum: Secondary | ICD-10-CM | POA: Diagnosis not present

## 2020-07-22 DIAGNOSIS — M79642 Pain in left hand: Secondary | ICD-10-CM

## 2020-07-22 DIAGNOSIS — R7303 Prediabetes: Secondary | ICD-10-CM | POA: Diagnosis not present

## 2020-07-22 DIAGNOSIS — M25561 Pain in right knee: Secondary | ICD-10-CM

## 2020-07-22 DIAGNOSIS — Z7189 Other specified counseling: Secondary | ICD-10-CM

## 2020-07-22 DIAGNOSIS — N946 Dysmenorrhea, unspecified: Secondary | ICD-10-CM | POA: Diagnosis not present

## 2020-07-22 DIAGNOSIS — M5459 Other low back pain: Secondary | ICD-10-CM | POA: Diagnosis not present

## 2020-07-22 DIAGNOSIS — M79641 Pain in right hand: Secondary | ICD-10-CM | POA: Diagnosis not present

## 2020-07-22 DIAGNOSIS — F419 Anxiety disorder, unspecified: Secondary | ICD-10-CM | POA: Diagnosis not present

## 2020-07-22 NOTE — Patient Instructions (Signed)

## 2020-07-24 LAB — COMPLETE METABOLIC PANEL WITH GFR
AG Ratio: 1.4 (calc) (ref 1.0–2.5)
ALT: 11 U/L (ref 6–29)
AST: 10 U/L (ref 10–30)
Albumin: 4.3 g/dL (ref 3.6–5.1)
Alkaline phosphatase (APISO): 51 U/L (ref 31–125)
BUN: 15 mg/dL (ref 7–25)
CO2: 26 mmol/L (ref 20–32)
Calcium: 9.4 mg/dL (ref 8.6–10.2)
Chloride: 103 mmol/L (ref 98–110)
Creat: 0.69 mg/dL (ref 0.50–1.10)
GFR, Est African American: 128 mL/min/{1.73_m2} (ref >=60)
GFR, Est Non African American: 110 mL/min/{1.73_m2} (ref >=60)
Globulin: 3.1 g/dL (calc) (ref 1.9–3.7)
Glucose, Bld: 89 mg/dL (ref 65–99)
Potassium: 4.3 mmol/L (ref 3.5–5.3)
Sodium: 137 mmol/L (ref 135–146)
Total Bilirubin: 0.4 mg/dL (ref 0.2–1.2)
Total Protein: 7.4 g/dL (ref 6.1–8.1)

## 2020-07-24 LAB — CBC WITH DIFFERENTIAL/PLATELET
Absolute Monocytes: 561 cells/uL (ref 200–950)
Basophils Absolute: 34 cells/uL (ref 0–200)
Basophils Relative: 0.4 %
Eosinophils Absolute: 119 cells/uL (ref 15–500)
Eosinophils Relative: 1.4 %
HCT: 39.4 % (ref 35.0–45.0)
Hemoglobin: 13 g/dL (ref 11.7–15.5)
Lymphs Abs: 2559 cells/uL (ref 850–3900)
MCH: 29.9 pg (ref 27.0–33.0)
MCHC: 33 g/dL (ref 32.0–36.0)
MCV: 90.6 fL (ref 80.0–100.0)
MPV: 11.5 fL (ref 7.5–12.5)
Monocytes Relative: 6.6 %
Neutro Abs: 5228 cells/uL (ref 1500–7800)
Neutrophils Relative %: 61.5 %
Platelets: 296 10*3/uL (ref 140–400)
RBC: 4.35 10*6/uL (ref 3.80–5.10)
RDW: 11.9 % (ref 11.0–15.0)
Total Lymphocyte: 30.1 %
WBC: 8.5 10*3/uL (ref 3.8–10.8)

## 2020-07-24 LAB — CARDIOLIPIN ANTIBODIES, IGG, IGM, IGA
Anticardiolipin IgA: <2 APL-U/mL
Anticardiolipin IgG: <2 GPL-U/mL
Anticardiolipin IgM: <2 MPL-U/mL

## 2020-07-24 LAB — BETA-2 GLYCOPROTEIN ANTIBODIES
Beta-2 Glyco 1 IgA: <2 U/mL
Beta-2 Glyco 1 IgM: <2 U/mL
Beta-2 Glyco I IgG: <2 U/mL

## 2020-07-24 LAB — URINALYSIS, ROUTINE W REFLEX MICROSCOPIC
Bilirubin Urine: NEGATIVE
Glucose, UA: NEGATIVE
Hgb urine dipstick: NEGATIVE
Ketones, ur: NEGATIVE
Leukocytes,Ua: NEGATIVE
Nitrite: NEGATIVE
Protein, ur: NEGATIVE
Specific Gravity, Urine: 1.021 (ref 1.001–1.03)
pH: 7 (ref 5.0–8.0)

## 2020-07-24 LAB — ANA: Anti Nuclear Antibody (ANA): NEGATIVE

## 2020-07-24 LAB — C3 AND C4
C3 Complement: 150 mg/dL (ref 83–193)
C4 Complement: 39 mg/dL (ref 15–57)

## 2020-07-24 LAB — CYCLIC CITRUL PEPTIDE ANTIBODY, IGG: Cyclic Citrullin Peptide Ab: <16 UNITS

## 2020-07-24 LAB — RFLX HEXAGONAL PHASE CONFIRM: Hexagonal Phase Conf: NEGATIVE

## 2020-07-24 LAB — LUPUS ANTICOAGULANT EVAL W/ REFLEX
PTT-LA Screen: 41 s — ABNORMAL HIGH (ref <=40)
dRVVT: 34 s (ref <=45)

## 2020-07-24 LAB — CK: Total CK: 102 U/L (ref 29–143)

## 2020-07-24 LAB — ANTI-DNA ANTIBODY, DOUBLE-STRANDED: ds DNA Ab: <1 IU/mL

## 2020-07-24 LAB — TSH: TSH: 1.59 mIU/L

## 2020-07-24 LAB — RHEUMATOID FACTOR: Rheumatoid fact SerPl-aCnc: <14 IU/mL (ref <14)

## 2020-07-24 LAB — SEDIMENTATION RATE: Sed Rate: 9 mm/h (ref 0–20)

## 2020-07-24 LAB — RNP ANTIBODY: Ribonucleic Protein(ENA) Antibody, IgG: 2.4 AI — AB

## 2020-07-24 LAB — GLUCOSE 6 PHOSPHATE DEHYDROGENASE: G-6PDH: 15.3 U/g Hgb (ref 7.0–20.5)

## 2020-07-24 NOTE — Progress Notes (Signed)
All the labs are within normal limits.  RNP is positive, which was positive before.  ANA is negative now.
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
## 2020-08-28 ENCOUNTER — Other Ambulatory Visit: Payer: Self-pay

## 2020-08-28 ENCOUNTER — Encounter: Payer: Self-pay | Admitting: Rheumatology

## 2020-08-28 ENCOUNTER — Ambulatory Visit: Payer: Medicaid Other | Admitting: Rheumatology

## 2020-08-28 VITALS — BP 118/84 | HR 88 | Resp 15 | Ht 65.0 in | Wt 200.0 lb

## 2020-08-28 DIAGNOSIS — M47816 Spondylosis without myelopathy or radiculopathy, lumbar region: Secondary | ICD-10-CM

## 2020-08-28 DIAGNOSIS — M4126 Other idiopathic scoliosis, lumbar region: Secondary | ICD-10-CM

## 2020-08-28 DIAGNOSIS — N946 Dysmenorrhea, unspecified: Secondary | ICD-10-CM | POA: Diagnosis not present

## 2020-08-28 DIAGNOSIS — M19041 Primary osteoarthritis, right hand: Secondary | ICD-10-CM

## 2020-08-28 DIAGNOSIS — L509 Urticaria, unspecified: Secondary | ICD-10-CM

## 2020-08-28 DIAGNOSIS — M19071 Primary osteoarthritis, right ankle and foot: Secondary | ICD-10-CM

## 2020-08-28 DIAGNOSIS — F419 Anxiety disorder, unspecified: Secondary | ICD-10-CM

## 2020-08-28 DIAGNOSIS — U071 COVID-19: Secondary | ICD-10-CM

## 2020-08-28 DIAGNOSIS — M7918 Myalgia, other site: Secondary | ICD-10-CM | POA: Diagnosis not present

## 2020-08-28 DIAGNOSIS — M19072 Primary osteoarthritis, left ankle and foot: Secondary | ICD-10-CM

## 2020-08-28 DIAGNOSIS — M170 Bilateral primary osteoarthritis of knee: Secondary | ICD-10-CM | POA: Diagnosis not present

## 2020-08-28 DIAGNOSIS — R768 Other specified abnormal immunological findings in serum: Secondary | ICD-10-CM | POA: Diagnosis not present

## 2020-08-28 DIAGNOSIS — R7303 Prediabetes: Secondary | ICD-10-CM

## 2020-08-28 DIAGNOSIS — M542 Cervicalgia: Secondary | ICD-10-CM | POA: Diagnosis not present

## 2020-08-28 DIAGNOSIS — M19042 Primary osteoarthritis, left hand: Secondary | ICD-10-CM

## 2020-08-28 NOTE — Patient Instructions (Addendum)
Journal for Nurse Practitioners, 15(4), 263-267. Retrieved May 30, 2018 from [email or web address]">  Knee Exercises Ask your health care provider which exercises are safe for you. Do exercises exactly as told by your health care provider and adjust them as directed. It is normal to feel mild stretching, pulling, tightness, or discomfort as you do these exercises. Stop right away if you feel sudden pain or your pain gets worse. Do not begin these exercises until told by your health care provider. Stretching and range-of-motion exercises These exercises warm up your muscles and joints and improve the movement and flexibility of your knee. These exercises also help to relieve pain and swelling. Knee extension, prone 1. Lie on your abdomen (prone position) on a bed. 2. Place your left / right knee just beyond the edge of the surface so your knee is not on the bed. You can put a towel under your left / right thigh just above your kneecap for comfort. 3. Relax your leg muscles and allow gravity to straighten your knee (extension). You should feel a stretch behind your left / right knee. 4. Hold this position for __________ seconds. 5. Scoot up so your knee is supported between repetitions. Repeat __________ times. Complete this exercise __________ times a day. Knee flexion, active  1. Lie on your back with both legs straight. If this causes back discomfort, bend your left / right knee so your foot is flat on the floor. 2. Slowly slide your left / right heel back toward your buttocks. Stop when you feel a gentle stretch in the front of your knee or thigh (flexion). 3. Hold this position for __________ seconds. 4. Slowly slide your left / right heel back to the starting position. Repeat __________ times. Complete this exercise __________ times a day. Quadriceps stretch, prone  1. Lie on your abdomen on a firm surface, such as a bed or padded floor. 2. Bend your left / right knee and hold  your ankle. If you cannot reach your ankle or pant leg, loop a belt around your foot and grab the belt instead. 3. Gently pull your heel toward your buttocks. Your knee should not slide out to the side. You should feel a stretch in the front of your thigh and knee (quadriceps). 4. Hold this position for __________ seconds. Repeat __________ times. Complete this exercise __________ times a day. Hamstring, supine 1. Lie on your back (supine position). 2. Loop a belt or towel over the ball of your left / right foot. The ball of your foot is on the walking surface, right under your toes. 3. Straighten your left / right knee and slowly pull on the belt to raise your leg until you feel a gentle stretch behind your knee (hamstring). ? Do not let your knee bend while you do this. ? Keep your other leg flat on the floor. 4. Hold this position for __________ seconds. Repeat __________ times. Complete this exercise __________ times a day. Strengthening exercises These exercises build strength and endurance in your knee. Endurance is the ability to use your muscles for a long time, even after they get tired. Quadriceps, isometric This exercise stretches the muscles in front of your thigh (quadriceps) without moving your knee joint (isometric). 1. Lie on your back with your left / right leg extended and your other knee bent. Put a rolled towel or small pillow under your knee if told by your health care provider. 2. Slowly tense the muscles in the front of your left /   right thigh. You should see your kneecap slide up toward your hip or see increased dimpling just above the knee. This motion will push the back of the knee toward the floor. 3. For __________ seconds, hold the muscle as tight as you can without increasing your pain. 4. Relax the muscles slowly and completely. Repeat __________ times. Complete this exercise __________ times a day. Straight leg raises This exercise stretches the muscles in front  of your thigh (quadriceps) and the muscles that move your hips (hip flexors). 1. Lie on your back with your left / right leg extended and your other knee bent. 2. Tense the muscles in the front of your left / right thigh. You should see your kneecap slide up or see increased dimpling just above the knee. Your thigh may even shake a bit. 3. Keep these muscles tight as you raise your leg 4-6 inches (10-15 cm) off the floor. Do not let your knee bend. 4. Hold this position for __________ seconds. 5. Keep these muscles tense as you lower your leg. 6. Relax your muscles slowly and completely after each repetition. Repeat __________ times. Complete this exercise __________ times a day. Hamstring, isometric 1. Lie on your back on a firm surface. 2. Bend your left / right knee about __________ degrees. 3. Dig your left / right heel into the surface as if you are trying to pull it toward your buttocks. Tighten the muscles in the back of your thighs (hamstring) to "dig" as hard as you can without increasing any pain. 4. Hold this position for __________ seconds. 5. Release the tension gradually and allow your muscles to relax completely for __________ seconds after each repetition. Repeat __________ times. Complete this exercise __________ times a day. Hamstring curls If told by your health care provider, do this exercise while wearing ankle weights. Begin with __________ lb weights. Then increase the weight by 1 lb (0.5 kg) increments. Do not wear ankle weights that are more than __________ lb. 1. Lie on your abdomen with your legs straight. 2. Bend your left / right knee as far as you can without feeling pain. Keep your hips flat against the floor. 3. Hold this position for __________ seconds. 4. Slowly lower your leg to the starting position. Repeat __________ times. Complete this exercise __________ times a day. Squats This exercise strengthens the muscles in front of your thigh and knee  (quadriceps). 1. Stand in front of a table, with your feet and knees pointing straight ahead. You may rest your hands on the table for balance but not for support. 2. Slowly bend your knees and lower your hips like you are going to sit in a chair. ? Keep your weight over your heels, not over your toes. ? Keep your lower legs upright so they are parallel with the table legs. ? Do not let your hips go lower than your knees. ? Do not bend lower than told by your health care provider. ? If your knee pain increases, do not bend as low. 3. Hold the squat position for __________ seconds. 4. Slowly push with your legs to return to standing. Do not use your hands to pull yourself to standing. Repeat __________ times. Complete this exercise __________ times a day. Wall slides This exercise strengthens the muscles in front of your thigh and knee (quadriceps). 1. Lean your back against a smooth wall or door, and walk your feet out 18-24 inches (46-61 cm) from it. 2. Place your feet hip-width apart. 3.   Slowly slide down the wall or door until your knees bend __________ degrees. Keep your knees over your heels, not over your toes. Keep your knees in line with your hips. 4. Hold this position for __________ seconds. Repeat __________ times. Complete this exercise __________ times a day. Straight leg raises This exercise strengthens the muscles that rotate the leg at the hip and move it away from your body (hip abductors). 1. Lie on your side with your left / right leg in the top position. Lie so your head, shoulder, knee, and hip line up. You may bend your bottom knee to help you keep your balance. 2. Roll your hips slightly forward so your hips are stacked directly over each other and your left / right knee is facing forward. 3. Leading with your heel, lift your top leg 4-6 inches (10-15 cm). You should feel the muscles in your outer hip lifting. ? Do not let your foot drift forward. ? Do not let your knee  roll toward the ceiling. 4. Hold this position for __________ seconds. 5. Slowly return your leg to the starting position. 6. Let your muscles relax completely after each repetition. Repeat __________ times. Complete this exercise __________ times a day. Straight leg raises This exercise stretches the muscles that move your hips away from the front of the pelvis (hip extensors). 1. Lie on your abdomen on a firm surface. You can put a pillow under your hips if that is more comfortable. 2. Tense the muscles in your buttocks and lift your left / right leg about 4-6 inches (10-15 cm). Keep your knee straight as you lift your leg. 3. Hold this position for __________ seconds. 4. Slowly lower your leg to the starting position. 5. Let your leg relax completely after each repetition. Repeat __________ times. Complete this exercise __________ times a day. This information is not intended to replace advice given to you by your health care provider. Make sure you discuss any questions you have with your health care provider. Document Revised: 05/31/2018 Document Reviewed: 05/31/2018 Elsevier Patient Education  [place]. Cervical Strain and Sprain Rehab Ask your health care provider which exercises are safe for you. Do exercises exactly as told by your health care provider and adjust them as directed. It is normal to feel mild stretching, pulling, tightness, or discomfort as you do these exercises. Stop right away if you feel sudden pain or your pain gets worse. Do not begin these exercises until told by your health care provider. Stretching and range-of-motion exercises Cervical side bending  6. Using good posture, sit on a stable chair or stand up. 7. Without moving your shoulders, slowly tilt your left / right ear to your shoulder until you feel a stretch in the opposite side neck muscles. You should be looking straight ahead. 8. Hold for __________ seconds. 9. Repeat with the other side of  your neck. Repeat __________ times. Complete this exercise __________ times a day. Cervical rotation  5. Using good posture, sit on a stable chair or stand up. 6. Slowly turn your head to the side as if you are looking over your left / right shoulder. ? Keep your eyes level with the ground. ? Stop when you feel a stretch along the side and the back of your neck. 7. Hold for __________ seconds. 8. Repeat this by turning to your other side. Repeat __________ times. Complete this exercise __________ times a day. Thoracic extension and pectoral stretch 5. Roll a towel or a  small blanket so it is about 4 inches (10 cm) in diameter. 6. Lie down on your back on a firm surface. 7. Put the towel lengthwise, under your spine in the middle of your back. It should not be under your shoulder blades. The towel should line up with your spine from your middle back to your lower back. 8. Put your hands behind your head and let your elbows fall out to your sides. 9. Hold for __________ seconds. Repeat __________ times. Complete this exercise __________ times a day. Strengthening exercises Isometric upper cervical flexion 5. Lie on your back with a thin pillow behind your head and a small rolled-up towel under your neck. 6. Gently tuck your chin toward your chest and nod your head down to look toward your feet. Do not lift your head off the pillow. 7. Hold for __________ seconds. 8. Release the tension slowly. Relax your neck muscles completely before you repeat this exercise. Repeat __________ times. Complete this exercise __________ times a day. Isometric cervical extension  5. Stand about 6 inches (15 cm) away from a wall, with your back facing the wall. 6. Place a soft object, about 6-8 inches (15-20 cm) in diameter, between the back of your head and the wall. A soft object could be a small pillow, a ball, or a folded towel. 7. Gently tilt your head back and press into the soft object. Keep your jaw  and forehead relaxed. 8. Hold for __________ seconds. 9. Release the tension slowly. Relax your neck muscles completely before you repeat this exercise. Repeat __________ times. Complete this exercise __________ times a day. Posture and body mechanics Body mechanics refers to the movements and positions of your body while you do your daily activities. Posture is part of body mechanics. Good posture and healthy body mechanics can help to relieve stress in your body's tissues and joints. Good posture means that your spine is in its natural S-curve position (your spine is neutral), your shoulders are pulled back slightly, and your head is not tipped forward. The following are general guidelines for applying improved posture and body mechanics to your everyday activities. Sitting  7. When sitting, keep your spine neutral and keep your feet flat on the floor. Use a footrest, if necessary, and keep your thighs parallel to the floor. Avoid rounding your shoulders, and avoid tilting your head forward. 8. When working at a desk or a computer, keep your desk at a height where your hands are slightly lower than your elbows. Slide your chair under your desk so you are close enough to maintain good posture. 9. When working at a computer, place your monitor at a height where you are looking straight ahead and you do not have to tilt your head forward or downward to look at the screen. Standing   When standing, keep your spine neutral and keep your feet about hip-width apart. Keep a slight bend in your knees. Your ears, shoulders, and hips should line up.  When you do a task in which you stand in one place for a long time, place one foot up on a stable object that is 2-4 inches (5-10 cm) high, such as a footstool. This helps keep your spine neutral. Resting When lying down and resting, avoid positions that are most painful for you. Try to support your neck in a neutral position. You can use a contour pillow or a  small rolled-up towel. Your pillow should support your neck but not push on it.  This information is not intended to replace advice given to you by your health care provider. Make sure you discuss any questions you have with your health care provider. Document Revised: 11/30/2018 Document Reviewed: 05/11/2018 Elsevier Patient Education  2020 [place]. Back Exercises The following exercises strengthen the muscles that help to support the trunk and back. They also help to keep the lower back flexible. Doing these exercises can help to prevent back pain or lessen existing pain.  If you have back pain or discomfort, try doing these exercises 2-3 times each day or as told by your health care provider.  As your pain improves, do them once each day, but increase the number of times that you repeat the steps for each exercise (do more repetitions).  To prevent the recurrence of back pain, continue to do these exercises once each day or as told by your health care provider. Do exercises exactly as told by your health care provider and adjust them as directed. It is normal to feel mild stretching, pulling, tightness, or discomfort as you do these exercises, but you should stop right away if you feel sudden pain or your pain gets worse. Exercises Single knee to chest Repeat these steps 3-5 times for each leg: 1. Lie on your back on a firm bed or the floor with your legs extended. 2. Bring one knee to your chest. Your other leg should stay extended and in contact with the floor. 3. Hold your knee in place by grabbing your knee or thigh with both hands and hold. 4. Pull on your knee until you feel a gentle stretch in your lower back or buttocks. 5. Hold the stretch for 10-30 seconds. 6. Slowly release and straighten your leg. Pelvic tilt Repeat these steps 5-10 times: 1. Lie on your back on a firm bed or the floor with your legs extended. 2. Bend your knees so they are pointing toward the ceiling and  your feet are flat on the floor. 3. Tighten your lower abdominal muscles to press your lower back against the floor. This motion will tilt your pelvis so your tailbone points up toward the ceiling instead of pointing to your feet or the floor. 4. With gentle tension and even breathing, hold this position for 5-10 seconds. Cat-cow Repeat these steps until your lower back becomes more flexible: 1. Get into a hands-and-knees position on a firm surface. Keep your hands under your shoulders, and keep your knees under your hips. You may place padding under your knees for comfort. 2. Let your head hang down toward your chest. Contract your abdominal muscles and point your tailbone toward the floor so your lower back becomes rounded like the back of a cat. 3. Hold this position for 5 seconds. 4. Slowly lift your head, let your abdominal muscles relax and point your tailbone up toward the ceiling so your back forms a sagging arch like the back of a cow. 5. Hold this position for 5 seconds.  Press-ups Repeat these steps 5-10 times: 1. Lie on your abdomen (face-down) on the floor. 2. Place your palms near your head, about shoulder-width apart. 3. Keeping your back as relaxed as possible and keeping your hips on the floor, slowly straighten your arms to raise the top half of your body and lift your shoulders. Do not use your back muscles to raise your upper torso. You may adjust the placement of your hands to make yourself more comfortable. 4. Hold this position for 5 seconds while  you keep your back relaxed. 5. Slowly return to lying flat on the floor.  Bridges Repeat these steps 10 times: 1. Lie on your back on a firm surface. 2. Bend your knees so they are pointing toward the ceiling and your feet are flat on the floor. Your arms should be flat at your sides, next to your body. 3. Tighten your buttocks muscles and lift your buttocks off the floor until your waist is at almost the same height as your  knees. You should feel the muscles working in your buttocks and the back of your thighs. If you do not feel these muscles, slide your feet 1-2 inches farther away from your buttocks. 4. Hold this position for 3-5 seconds. 5. Slowly lower your hips to the starting position, and allow your buttocks muscles to relax completely. If this exercise is too easy, try doing it with your arms crossed over your chest. Abdominal crunches Repeat these steps 5-10 times: 1. Lie on your back on a firm bed or the floor with your legs extended. 2. Bend your knees so they are pointing toward the ceiling and your feet are flat on the floor. 3. Cross your arms over your chest. 4. Tip your chin slightly toward your chest without bending your neck. 5. Tighten your abdominal muscles and slowly raise your trunk (torso) high enough to lift your shoulder blades a tiny bit off the floor. Avoid raising your torso higher than that because it can put too much stress on your low back and does not help to strengthen your abdominal muscles. 6. Slowly return to your starting position. Back lifts Repeat these steps 5-10 times: 1. Lie on your abdomen (face-down) with your arms at your sides, and rest your forehead on the floor. 2. Tighten the muscles in your legs and your buttocks. 3. Slowly lift your chest off the floor while you keep your hips pressed to the floor. Keep the back of your head in line with the curve in your back. Your eyes should be looking at the floor. 4. Hold this position for 3-5 seconds. 5. Slowly return to your starting position. Contact a health care provider if:  Your back pain or discomfort gets much worse when you do an exercise.  Your worsening back pain or discomfort does not lessen within 2 hours after you exercise. If you have any of these problems, stop doing these exercises right away. Do not do them again unless your health care provider says that you can. Get help right away if:  You develop  sudden, severe back pain. If this happens, stop doing the exercises right away. Do not do them again unless your health care provider says that you can. This information is not intended to replace advice given to you by your health care provider. Make sure you discuss any questions you have with your health care provider. Document Revised: 12/15/2018 Document Reviewed: 05/12/2018 Elsevier Patient Education  [place].  COVID-19 vaccine recommendations:   COVID-19 vaccine is recommended for everyone (unless you are allergic to a vaccine component), even if you are on a medication that suppresses your immune system.   Do not take Tylenol or any anti-inflammatory medications (NSAIDs) 24 hours prior to the COVID-19 vaccination.   There is no direct evidence about the efficacy of the COVID-19 vaccine in individuals who are on medications that suppress the immune system.   Even if you are fully vaccinated, and you are on any medications that suppress your immune  system, please continue to wear a mask, maintain at least six feet social distance and practice hand hygiene.   If you develop a COVID-19 infection, please contact your PCP or our office to determine if you need monoclonal antibody infusion.  The booster vaccine is now available for immunocompromised patients.   Please see the following web sites for updated information.   [email or web address]

## 2020-09-23 ENCOUNTER — Other Ambulatory Visit: Payer: Self-pay

## 2020-09-23 ENCOUNTER — Encounter: Payer: Self-pay | Admitting: Physical Therapy

## 2020-09-23 ENCOUNTER — Ambulatory Visit: Payer: Medicaid Other | Attending: Interventional Radiology | Admitting: Physical Therapy

## 2020-09-23 DIAGNOSIS — M62830 Muscle spasm of back: Secondary | ICD-10-CM | POA: Diagnosis present

## 2020-09-23 DIAGNOSIS — M47816 Spondylosis without myelopathy or radiculopathy, lumbar region: Secondary | ICD-10-CM | POA: Diagnosis present

## 2020-09-23 DIAGNOSIS — M542 Cervicalgia: Secondary | ICD-10-CM | POA: Diagnosis present

## 2020-09-23 DIAGNOSIS — G8929 Other chronic pain: Secondary | ICD-10-CM | POA: Diagnosis present

## 2020-09-23 DIAGNOSIS — M545 Low back pain, unspecified: Secondary | ICD-10-CM | POA: Diagnosis present

## 2020-09-23 NOTE — Patient Instructions (Signed)
Access Code: [email or web address]: [email or web address]  trigger point shepherds hook   Supine Piriformis Stretch with Foot on Ground - 2 x daily - 7 x weekly - 3 sets - 3 reps - 20 hold  Supine Lower Trunk Rotation - 2 x daily - 7 x weekly - 3 sets - 10 reps  Standing Glute Med Mobilization with Small Ball on Wall - 12 x daily - 7 x weekly - 3 sets - 10 reps - 1-2 minutes hold

## 2020-09-23 NOTE — Therapy (Signed)
[place], [place], [address] Phone: [phone]   Fax:  [phone]  Physical Therapy Evaluation  Patient Details  Name: DYNASTY HOLQUIN MRN: [id number] Date of Birth: December 04, 1980 Referring Provider (PT): Jaynie Collins MD   Encounter Date: 09/23/2020   PT End of Session - 09/23/20 1136    Visit Number 1    Number of Visits 12    Date for PT Re-Evaluation 11/04/20    Authorization Type [employer]    PT Start Time 0933    PT Stop Time 1016    PT Time Calculation (min) 43 min    Activity Tolerance Patient tolerated treatment well    Behavior During Therapy [facility] for tasks assessed/performed           Past Medical History:  Diagnosis Date  . Allergy    Phreesia 02/29/2020  . Genital herpes    no outbreak in 83yrs  . Infection    UTI  . No pertinent past medical history   . Trichomonas infection   . Urticaria     Past Surgical History:  Procedure Laterality Date  . DILATION AND CURETTAGE OF UTERUS    . INDUCED ABORTION    . WISDOM TOOTH EXTRACTION      There were no vitals filed for this visit.    Subjective Assessment - 09/23/20 0939    Subjective Patient has a long history of lower back pain. She had two icncedents of truama when she was younger that have eeffected her back. The pback can hurt at different times for different reasons. She also havs pain in her right hand. She takes ibuprofin daily. The pain is mostly in the middle of her back. The legs can hurt at times. She has had tight sided neck pain and stiffness.    Pertinent History nothing pertinent    Limitations Sitting;Walking;Standing    How long can you sit comfortably? sitting ids comfortable    How long can you stand comfortably? depends on the day    How long can you walk comfortably? depedns on the day    Diagnostic tests Bilateral knee: moderate arhtritis;    Patient Stated Goals to have less pain in her back and  neck    Currently in Pain? Yes    Pain Score 6     Pain Location Back    Pain Orientation Mid;Lower    Pain Descriptors / Indicators Aching    Pain Type Chronic pain    Pain Radiating Towards into bilateral LE    Pain Onset More than a month ago    Pain Frequency Constant    Aggravating Factors  Standing, walking , sleeping position    Pain Relieving Factors ibuprofin    Effect of Pain on Daily Activities pain with all daily activity    Multiple Pain Sites Yes    Pain Score 6    Pain Location Neck    Pain Orientation Right    Pain Descriptors / Indicators Aching    Pain Type Chronic pain    Pain Onset More than a month ago    Pain Frequency Constant    Aggravating Factors  using her arms; turning her head    Pain Relieving Factors rest    Effect of Pain on Daily Activities difficulty perfroming ADL's              [facility] PT Assessment - 09/23/20 0001      Assessment  Medical Diagnosis Low Back Pain/ Cervicalgia    Referring Provider (PT) Clementeen Hoof MD    Onset Date/Surgical Date --   For several years   Hand Dominance Right    Next MD Visit August    Prior Therapy Nothing      Precautions   Precautions None      Restrictions   Weight Bearing Restrictions No      Balance Screen   Has the patient fallen in the past 6 months No    Has the patient had a decrease in activity level because of a fear of falling?  No    Is the patient reluctant to leave their home because of a fear of falling?  No      Home [employer]ist information centre manager residence    Additional Comments A few steps onto the porch      Prior Function   Level of Independence Independent    Vocation Part time employment    Art therapist    Leisure Likes to walk her dog.      Cognition   Overall Cognitive Status Within Functional Limits for tasks assessed    Attention Focused    Focused Attention Appears intact    Memory Appears intact    Awareness Appears intact     Problem Solving Appears intact      Observation/Other Assessments   Observations Left shoulder elevation; bilateral flat foot.      Sensation   Light Touch Appears Intact      Coordination   Gross Motor Movements are Fluid and Coordinated Yes    Fine Motor Movements are Fluid and Coordinated Yes      Posture/Postural Control   Posture Comments rounded shoulders, forward head      ROM / Strength   AROM / PROM / Strength Strength;AROM;PROM      AROM   AROM Assessment Site Lumbar;Cervical    Cervical Flexion 40    Cervical Extension 43    Cervical - Right Rotation 80    Cervical - Left Rotation 76    Lumbar Flexion 90    Lumbar Extension WN:L    Lumbar - Right Side Bend wnl    Lumbar - Left Side Bend poain side bending to the left    Lumbar - Right Rotation wnl    Lumbar - Left Rotation WNL      PROM   PROM Assessment Site Hip    Right/Left Hip Right;Left    Right Hip Flexion 90   mild pain   Left Hip Flexion 93 with pain    Left Hip Internal Rotation  --   limited motion     Strength   Strength Assessment Site Hip;Knee    Right/Left Hip Right;Left    Right Hip Flexion 3/5    Right Hip ABduction 3+/5    Left Hip Flexion 3/5    Left Hip ABduction 3+/5    Right/Left Knee Right;Left    Right Knee Flexion 4/5    Right Knee Extension 4/5    Left Knee Flexion 4/5    Left Knee Extension 4/5      Palpation   Palpation comment spasming of bliateral gluteals R>L and bilateral lumbar paraspinals; tedner tro palpation in thoracic area and bilateral upper traps right >L      Ambulation/Gait   Gait Comments mild lateral movement with gait.  Objective measurements completed on examination: See above findings.       [facility] Adult PT Treatment/Exercise - 09/23/20 0001      Exercises   Exercises Lumbar      Lumbar Exercises: Stretches   Lower Trunk Rotation Limitations x20    Piriformis Stretch Limitations glute stretch 3x20 second  hold.    Other Lumbar Stretch Exercise reviewed self trigger point release wiht tennis ball and thera-cane                  PT Education - 09/23/20 1135    Education Details benefits of low intensity high rep exercises for firbormayliga, HEP\, symptom mangement, proper foot wear    Person(s) Educated Patient    Methods Tactile cues;Verbal cues;Demonstration;Explanation    Comprehension Verbalized understanding;Returned demonstration;Verbal cues required;Tactile cues required            PT Short Term Goals - 09/23/20 1448      PT SHORT TERM GOAL #1   Title Patient will increase bilateral hip strength to 4+/5 gross    Baseline 3/5 bilateral hip flexion 3+/5 hip abduction    Time 3    Period Weeks    Status New    Target Date 10/14/20      PT SHORT TERM GOAL #2   Title Patient will improve passive bilateral hip flexion to 115 degrees without pain    Baseline Pain withflexion past 90 degrees bilateral    Time 3    Period Weeks    Status New    Target Date 10/14/20      PT SHORT TERM GOAL #3   Title Patient will be independent with basic HEP for UE and LE strengthening    Baseline has no HEP    Time 3    Period Weeks    Status New    Target Date 10/14/20             PT Long Term Goals - 09/23/20 1451      PT LONG TERM GOAL #1   Title Patient will sleep through the night without increased pain    Baseline frequently wakes up in the night without pain    Time 6    Period Weeks    Status New    Target Date 11/04/20      PT LONG TERM GOAL #2   Title Patient will stand for 1 hour without increased pain    Baseline increased pain with limited standing time. Time can be variable.    Time 6    Period Weeks    Status New    Target Date 11/04/20                  Plan - 09/23/20 1138    Clinical Impression Statement Patient is a 40 year old female with low back and right cervical pain. she also has bilateral knee and right hand pain. She presents  with decreased strength in her hip flexors and abductors. She has pain with left side bending and rotation but full motion. She has limitations in activ and passive hip flexion and IR. She has WNL cervical movement. She has spasming in her gluteals, lower back, mid throacic area, right upper trap, and cervical paraspinals. She would benefit from skilled therapy to work on a hip strengthening progream and a program that helps manage multiple trigger poitns.    Personal Factors and Comorbidities Time since onset of injury/illness/exacerbation    Examination-Activity Limitations Bed  Mobility;Sit;Squat;Stairs;Locomotion Level;Lift;Stand    Examination-Participation Restrictions Cleaning;Occupation;Shop;Interpersonal Relationship;Medication Management    Stability/Clinical Decision Making Evolving/Moderate complexity    Clinical Decision Making Moderate    Rehab Potential Good    PT Frequency 2x / week    PT Duration 6 weeks    PT Treatment/Interventions ADLs/Self Care Home Management;Cryotherapy;Electrical Stimulation;Traction;Moist Heat;Ultrasound;DME Instruction;Gait training;Functional mobility Scientist, forensic;Therapeutic activities;Therapeutic exercise;Neuromuscular re-education;Patient/family education;Manual techniques;Passive range of motion;Dry needling;Taping    PT Next Visit Plan review stretching exercises for the neck; review HEP; begin light core astrengthening . Patient is weak in hip flors and abuctors. Consider soft tissue mobilization to lower lumbar spine and upper traps;    PT Home Exercise Plan [id number]    Consulted and Agree with Plan of Care Patient           Patient will benefit from skilled therapeutic intervention in order to improve the following deficits and impairments:  Decreased range of motion,Difficulty walking,Increased fascial restricitons,Increased muscle spasms,Decreased endurance,Pain,Decreased mobility,Decreased strength  Visit Diagnosis: Chronic  bilateral low back pain without sciatica  Cervicalgia  Neck pain  Arthropathy of lumbar facet joint  Muscle spasm of back     Problem List Patient Active Problem List   Diagnosis Date Noted  . Prediabetes 06/21/2019  . Unspecified disorder of menstruation and other abnormal bleeding from female genital tract 12/28/2013  . Anxiety 02/07/2013  . Dysmenorrhea 02/07/2013    Carney Living PT DPT  09/23/2020, 3:00 PM  [facility] [address] [place], [place], [address] Phone: [phone]   Fax:  [phone]  Name: LIZZETE GOUGH MRN: [id number] Date of Birth: February 24, 1981

## 2020-09-30 ENCOUNTER — Telehealth: Payer: Self-pay

## 2020-09-30 ENCOUNTER — Ambulatory Visit: Payer: Medicaid Other | Attending: Interventional Radiology

## 2020-09-30 NOTE — Telephone Encounter (Signed)
Patient states she called to cancel this appointment. Patient confirmed next scheduled appointment.

## 2020-10-10 ENCOUNTER — Ambulatory Visit: Payer: Medicaid Other | Admitting: Physical Therapy

## 2020-10-17 ENCOUNTER — Telehealth: Payer: Self-pay

## 2020-10-17 ENCOUNTER — Ambulatory Visit: Payer: Medicaid Other

## 2020-10-17 NOTE — Telephone Encounter (Signed)
Left voicemail notifying patient of missed appointment. Informed patient of attendance policy letting her know that she can only schedule one appointment at a time moving forward due to missed appointments. Reminded her of next scheduled visit and to call if she needs to cancel/reschedule.

## 2020-10-22 ENCOUNTER — Other Ambulatory Visit: Payer: Self-pay | Admitting: Internal Medicine

## 2020-10-22 DIAGNOSIS — N946 Dysmenorrhea, unspecified: Secondary | ICD-10-CM

## 2020-10-24 ENCOUNTER — Other Ambulatory Visit: Payer: Self-pay

## 2020-10-24 ENCOUNTER — Encounter: Payer: Self-pay | Admitting: Physical Therapy

## 2020-10-24 ENCOUNTER — Ambulatory Visit: Payer: Medicaid Other | Attending: Rheumatology | Admitting: Physical Therapy

## 2020-10-24 DIAGNOSIS — M545 Low back pain, unspecified: Secondary | ICD-10-CM | POA: Insufficient documentation

## 2020-10-24 DIAGNOSIS — M542 Cervicalgia: Secondary | ICD-10-CM | POA: Insufficient documentation

## 2020-10-24 DIAGNOSIS — M62830 Muscle spasm of back: Secondary | ICD-10-CM | POA: Diagnosis present

## 2020-10-24 DIAGNOSIS — M47816 Spondylosis without myelopathy or radiculopathy, lumbar region: Secondary | ICD-10-CM | POA: Insufficient documentation

## 2020-10-24 DIAGNOSIS — G8929 Other chronic pain: Secondary | ICD-10-CM | POA: Diagnosis present

## 2020-10-25 ENCOUNTER — Encounter: Payer: Self-pay | Admitting: Physical Therapy

## 2020-10-25 NOTE — Therapy (Addendum)
[place], [place], [address] Phone: [phone]   Fax:  [phone]  Physical Therapy Treatment/Discharge   Patient Details  Name: Hannah Fleming MRN: [id number] Date of Birth: 12-28-1980 Referring Provider (PT): Jaynie Collins MD   Encounter Date: 10/24/2020   PT End of Session - 10/24/20 1357     Visit Number 2    Number of Visits 12    Date for PT Re-Evaluation 11/04/20    Authorization Type [employer]    PT Start Time 1030   Patient 15 minutes late for her appointment '   PT Stop Time 1100    PT Time Calculation (min) 30 min    Activity Tolerance Patient tolerated treatment well    Behavior During Therapy [facility] for tasks assessed/performed             Past Medical History:  Diagnosis Date   Allergy    Phreesia 02/29/2020   Genital herpes    no outbreak in 40yr   Infection    UTI   No pertinent past medical history    Trichomonas infection    Urticaria     Past Surgical History:  Procedure Laterality Date   DILATION AND CURETTAGE OF UTERUS     INDUCED ABORTION     WISDOM TOOTH EXTRACTION      There were no vitals filed for this visit.   Subjective Assessment - 10/24/20 1338     Subjective Patient has had some difficulty gettingto therapy. She has a lot of things ggoing on at home. She is having some OB prolems that may be contributing to her ack pain> She had used her stretches with some sucsess.    Pertinent History nothing pertinent    How long can you sit comfortably? sitting ids comfortable    How long can you stand comfortably? depends on the day    How long can you walk comfortably? depedns on the day    Diagnostic tests Bilateral knee: moderate arhtritis;    Patient Stated Goals to have less pain in her back and neck    Currently in Pain? No/denies                               [facility]       Lumbar  Exercises: Stretches   Lower Trunk Rotation Limitations x20    Piriformis Stretch Limitations glute stretch 3x20 second hold.    Other Lumbar Stretch Exercise reviewed self trigger point release wiht tennis ball and thera-cane      Lumbar Exercises: Seated   Other Seated Lumbar Exercises bilateral er with good posturee 2x10 red    Other Seated Lumbar Exercises horizontal abduction red 2x10      Lumbar Exercises: Supine   AB Set Limitations reviewed proper breathing with patient with all exercises    Pelvic Tilt Limitations x10 reviewed along with abdominal breathing.    Clam Limitations x10 red also given a green band    Bent Knee Raise Limitations x7 reviewed how to progress    Bridge Limitations x10 not able to go through full range                    PT Education - 10/24/20 1342     Education Details reviewed HEP to work on for a few weeks.    Person(s) Educated Patient  Methods Explanation;Demonstration;Tactile cues;Verbal cues    Comprehension Verbalized understanding;Returned demonstration;Verbal cues required;Tactile cues required              PT Short Term Goals - 09/23/20 1448       PT SHORT TERM GOAL #1   Title Patient will increase bilateral hip strength to 4+/5 gross    Baseline 3/5 bilateral hip flexion 3+/5 hip abduction    Time 3    Period Weeks    Status New    Target Date 10/14/20      PT SHORT TERM GOAL #2   Title Patient will improve passive bilateral hip flexion to 115 degrees without pain    Baseline Pain withflexion past 90 degrees bilateral    Time 3    Period Weeks    Status New    Target Date 10/14/20      PT SHORT TERM GOAL #3   Title Patient will be independent with basic HEP for UE and LE strengthening    Baseline has no HEP    Time 3    Period Weeks    Status New    Target Date 10/14/20               PT Long Term Goals - 09/23/20 1451       PT LONG TERM GOAL #1   Title Patient will sleep through the night  without increased pain    Baseline frequently wakes up in the night without pain    Time 6    Period Weeks    Status New    Target Date 11/04/20      PT LONG TERM GOAL #2   Title Patient will stand for 1 hour without increased pain    Baseline increased pain with limited standing time. Time can be variable.    Time 6    Period Weeks    Status New    Target Date 11/04/20                   Plan - 10/24/20 1357     Clinical Impression Statement The patient has had some difficulty getting to PT. She knows she needs to do it but she is starting a new business. She was late today so we had time to go through and HEP. She ws given basic exercises and advised how to advance them. She did a few reps of each but was advised at home to pick 3 exercises and two stretches then work through [employer] of them. She was given exercises in 3 different positions for variety. Therapy encouraged her that all she needs s her plan to work on. She can do that without coming here as long as she is consitent and persistant.    Personal Factors and Comorbidities Time since onset of injury/illness/exacerbation    Examination-Activity Limitations Bed Mobility;Sit;Squat;Stairs;Locomotion Level;Lift;Stand    Examination-Participation Restrictions Cleaning;Occupation;Shop;Interpersonal Relationship;Medication Management    Stability/Clinical Decision Making Evolving/Moderate complexity    Clinical Decision Making Moderate    Rehab Potential Good    PT Frequency 2x / week    PT Duration 6 weeks    PT Treatment/Interventions ADLs/Self Care Home Management;Cryotherapy;Electrical Stimulation;Traction;Moist Heat;Ultrasound;DME Instruction;Gait training;Functional mobility Scientist, forensic;Therapeutic activities;Therapeutic exercise;Neuromuscular re-education;Patient/family education;Manual techniques;Passive range of motion;Dry needling;Taping    PT Next Visit Plan review HEP when she comes back in May    [place] and Agree with Plan of Care Patient  Patient will benefit from skilled therapeutic intervention in order to improve the following deficits and impairments:  Decreased range of motion,Difficulty walking,Increased fascial restricitons,Increased muscle spasms,Decreased endurance,Pain,Decreased mobility,Decreased strength  Visit Diagnosis: Chronic bilateral low back pain without sciatica  Cervicalgia  Neck pain  Muscle spasm of back  Arthropathy of lumbar facet joint  PHYSICAL THERAPY DISCHARGE SUMMARY  Visits from Start of Care: 2  Current functional level related to goals / functional outcomes: Performing HEP   Remaining deficits: Still having multi joint pain   Education / Equipment: HEP   Patient agrees to discharge. Patient goals were not met. Patient is being discharged due to  difficulty with transportation .    Problem List Patient Active Problem List   Diagnosis Date Noted   Prediabetes 06/21/2019   Unspecified disorder of menstruation and other abnormal bleeding from female genital tract 12/28/2013   Anxiety 02/07/2013   Dysmenorrhea 02/07/2013    Carney Living PT DPT  10/25/2020, 7:57 AM   [facility] [address] [place], [place], [address] Phone: [phone]   Fax:  [phone]  Name: Hannah Fleming MRN: [id number] Date of Birth: 1980/12/10

## 2020-11-19 ENCOUNTER — Ambulatory Visit: Payer: Medicaid Other | Admitting: Internal Medicine

## 2020-11-25 ENCOUNTER — Other Ambulatory Visit (HOSPITAL_COMMUNITY)
Admission: RE | Admit: 2020-11-25 | Discharge: 2020-11-25 | Disposition: A | Discharge status: Home / Self Care | Payer: Medicaid Other | Source: Ambulatory Visit | Attending: Internal Medicine | Admitting: Internal Medicine

## 2020-11-25 ENCOUNTER — Encounter: Payer: Self-pay | Admitting: Family

## 2020-11-25 ENCOUNTER — Ambulatory Visit (INDEPENDENT_AMBULATORY_CARE_PROVIDER_SITE_OTHER): Payer: Medicaid Other | Admitting: Family

## 2020-11-25 ENCOUNTER — Other Ambulatory Visit: Payer: Self-pay

## 2020-11-25 VITALS — BP 118/80 | HR 92 | Ht 63.5 in | Wt 196.6 lb

## 2020-11-25 DIAGNOSIS — B9689 Other specified bacterial agents as the cause of diseases classified elsewhere: Secondary | ICD-10-CM | POA: Diagnosis not present

## 2020-11-25 DIAGNOSIS — B3731 Acute candidiasis of vulva and vagina: Secondary | ICD-10-CM

## 2020-11-25 DIAGNOSIS — R8290 Unspecified abnormal findings in urine: Secondary | ICD-10-CM

## 2020-11-25 DIAGNOSIS — N760 Acute vaginitis: Secondary | ICD-10-CM

## 2020-11-25 DIAGNOSIS — N926 Irregular menstruation, unspecified: Secondary | ICD-10-CM | POA: Diagnosis not present

## 2020-11-25 DIAGNOSIS — B373 Candidiasis of vulva and vagina: Secondary | ICD-10-CM

## 2020-11-25 LAB — POCT URINALYSIS DIP (CLINITEK)
Bilirubin, UA: NEGATIVE
Glucose, UA: NEGATIVE mg/dL
Ketones, POC UA: NEGATIVE mg/dL
Leukocytes, UA: NEGATIVE
Nitrite, UA: NEGATIVE
POC PROTEIN,UA: NEGATIVE
Spec Grav, UA: >=1.03 — AB (ref 1.010–1.025)
Urobilinogen, UA: 0.2 E.U./dL
pH, UA: 6 (ref 5.0–8.0)

## 2020-11-25 NOTE — Progress Notes (Signed)
Patient ID: Hannah Fleming, female    DOB: [birth year]  MRN: [id number]  CC: Urine Odor  Subjective: Hannah Fleming is a 40 y.o. female who presents for urine odor. Her concerns today include:   1. URINARY ODOR: Duration: 1 month Back pain  Dysuria: no Urinary frequency: yes but drinking more water Urgency: no Small volume voids: no Symptom severity: no Urinary incontinence: no Foul odor: yes Hematuria: no Abdominal pain: no Back pain: yes Fever:  no Vomiting: no Relief with cranberry juice: no Sexual activity: yes Comments: Reports that if testings are positive today that she cannot take oral medications until Ramadan ends.  2. IRREGULAR MENSTRUATION: Reports cycles are irregular, heavy flow, with back pain. Recently spotting for about 2 days. Then 3 days later period began and lasted 7 days which is her baseline. Has been ongoing for some time. Not taking birth control and does not desire to begin birth control. She took a home pregnancy test recently, 11/16/2020, which was negative. Sexually active. Not actively trying to conceive. LMP: 11/20/2020.   Patient Active Problem List   Diagnosis Date Noted  . Prediabetes 06/21/2019  . Unspecified disorder of menstruation and other abnormal bleeding from female genital tract 12/28/2013  . Anxiety 02/07/2013  . Dysmenorrhea 02/07/2013     Current Outpatient Medications on File Prior to Visit  Medication Sig Dispense Refill  . Acetaminophen-Codeine (TYLENOL/CODEINE #3) 300-30 MG tablet Take 1 tablet by mouth every 4 (four) hours as needed for pain (with menstrual cycles). 20 tablet 2  . CRANBERRY PO Take 1 tablet by mouth daily.    . IBUPROFEN PO Take by mouth as needed.    . Multiple Vitamins-Minerals (MULTIVITAMIN ADULT) TABS Take 1 tablet by mouth daily.     No current facility-administered medications on file prior to visit.    Allergies  Allergen Reactions  . Prilosec Otc [Omeprazole Magnesium] Hives  . Latex  Itching and Rash    Social History   Socioeconomic History  . Marital status: Single    Spouse name: Not on file  . Number of children: 3  . Years of education: Not on file  . Highest education level: Not on file  Occupational History  . Not on file  Tobacco Use  . Smoking status: Former Smoker    Types: Cigarettes    Quit date: 02/07/2009    Years since quitting: 11.8  . Smokeless tobacco: Never Used  Vaping Use  . Vaping Use: Never used  Substance and Sexual Activity  . Alcohol use: Yes    Alcohol/week: 0.0 standard drinks    Comment: occasional  . Drug use: No  . Sexual activity: Yes    Partners: Male    Birth control/protection: None  Other Topics Concern  . Not on file  Social History Narrative  . Not on file   Social Determinants of Health   Financial Resource Strain: Not on file  Food Insecurity: Not on file  Transportation Needs: Not on file  Physical Activity: Not on file  Stress: Not on file  Social Connections: Not on file  Intimate Partner Violence: Not on file    Family History  Problem Relation Age of Onset  . Healthy Mother   . Sickle cell trait Mother   . Healthy Father   . Sickle cell trait Sister   . Sickle cell trait Brother   . Healthy Daughter   . Healthy Son   . Healthy Son   . Other Neg  Hx     Past Surgical History:  Procedure Laterality Date  . DILATION AND CURETTAGE OF UTERUS    . INDUCED ABORTION    . WISDOM TOOTH EXTRACTION      ROS: Review of Systems Negative except as stated above  PHYSICAL EXAM: BP 118/80 (BP Location: Left Arm, Patient Position: Sitting)   Pulse 92   Ht 5' 3.5" (1.613 m)   Wt 196 lb 9.6 oz (89.2 kg)   SpO2 100%   BMI 34.28 kg/m   Physical Exam HENT:     Head: Normocephalic.  Eyes:     Extraocular Movements: Extraocular movements intact.     Conjunctiva/sclera: Conjunctivae normal.     Pupils: Pupils are equal, round, and reactive to light.  Cardiovascular:     Rate and Rhythm: Normal  rate and regular rhythm.     Pulses: Normal pulses.     Heart sounds: Normal heart sounds.  Pulmonary:     Effort: Pulmonary effort is normal.     Breath sounds: Normal breath sounds.  Musculoskeletal:     Cervical back: Normal range of motion and neck supple.  Neurological:     General: No focal deficit present.     Mental Status: She is alert and oriented to person, place, and time.  Psychiatric:        Mood and Affect: Mood normal.        Behavior: Behavior normal.     ASSESSMENT AND PLAN: 1. Abnormal urine odor: - Urine negative for nitrates and leukocytes.  - Cervicovaginal self-swab to screen for chlamydia, gonorrhea, trichomonas, bacterial vaginitis, and candida vaginitis. - Referral to Urogynecology for further evaluation and management.  - Follow-up with primary provider as scheduled. - POCT URINALYSIS DIP (CLINITEK) - Cervicovaginal ancillary only - Ambulatory referral to Urogynecology  2. Irregular menses: - Referral to Gynecology for further evaluation and management. - Ambulatory referral to Gynecology   Patient was given the opportunity to ask questions.  Patient verbalized understanding of the plan and was able to repeat key elements of the plan. Patient was given clear instructions to go to Emergency Department or return to medical center if symptoms don't improve, worsen, or new problems develop.The patient verbalized understanding.   Orders Placed This Encounter  Procedures  . Ambulatory referral to Gynecology  . POCT URINALYSIS DIP (CLINITEK)     Requested Prescriptions    No prescriptions requested or ordered in this encounter    Follow-up with primary provider as scheduled.  Camillia Herter, NP

## 2020-11-25 NOTE — Progress Notes (Signed)
Irregular cycle Vaginal odor-1 month

## 2020-11-25 NOTE — Patient Instructions (Signed)
Urinalysis and vaginal self-swab today.   Referral to Gynecology.  Urinalysis Test Why am I having this test? You may have a urinalysis (UA) test:  As part of routine wellness screening.  Before surgery.  During pregnancy. You may also need to have this test if you have:  Kidney disease.  Symptoms of a urinary tract infection (UTI).  Diabetes.  A condition that causes an imbalance in your hormones. What is being tested? A urinalysis is a series of tests done on a sample of your urine. Your kidneys filter your blood to make urine. They get rid of waste products and save the important parts of your blood, such as proteins and minerals (electrolytes). You may need more testing if your UA shows too much:  Protein.  Sugar (glucose).  Blood cells.  Bacteria. What kind of sample is taken? A urine sample is required for this test. How do I collect samples at home? You usually collect a urine sample by urinating into a clean cup. You may be asked to collect a urine sample first thing in the morning. When collecting a urine sample at home, make sure you:  Use supplies and instructions that you received from the lab.  Collect urine only in the germ-free (sterile) cup that you received from the lab.  Do not let any toilet paper or stool (feces) get into the cup.  Refrigerate the sample until you can return it to the lab.  Return the sample or samples to the lab as told.   Tell a health care provider about:  All medicines you are taking, including vitamins, herbs, eye drops, creams, and over-the-counter medicines.  Any medical conditions you have.  Whether you are pregnant or may be pregnant. What happens during the test? UA is divided into three parts:  A visual exam of your urine sample to check for color or cloudiness.  A dipstick test to check for: ? Proteins. ? Concentration (specific gravity). ? Acidity (pH). ? Glucose. ? Ketones. These are by-products of  your body burning fat for energy instead of sugar. ? A waste product from red blood cells (bilirubin). ? A product of white blood cells (leukocyte esterase). ? A product of bacteria (nitrite). ? Blood.  A microscopic exam to check for: ? Red blood cells. ? White blood cells. ? Tube-shaped proteins (hyaline casts). ? Crystal structures. ? Bacteria. ? Epithelial cells. These are cells that line your urinary tract. ? Yeast. How are the results reported? Some of your test results will be reported as values. Your health care provider will compare your results to normal ranges that were established after testing a large group of people (reference ranges). Reference ranges may vary among different labs and hospitals. For this test, common reference ranges are:  pH: 4.6-8.0 (average, 6.0).  Protein: ? 0-8 mg/dL. ? 50-80 mg/24 hr (at rest). ? Less than 250 mg/24 hr (during exercise).  Specific gravity: ? Adult: 1.005-1.030 (usually, 1.010-1.025). ? Elderly: values decrease with age. ? Newborn: 1.001-1.020.  Nitrites: none.  Ketones: none.  Bilirubin: none.  Urobilinogen: 4.48-1 Ehrlich unit/mL.  Crystals: none.  Casts: none.  Glucose: ? Fresh specimen: none. ? 24-hour specimen: 50-300 mg/24 hr or 0.3-1.7 mmol/day (SI units).  White blood cells (WBCs): 0-4 per low-power field.  WBC casts: none.  Red blood cells (RBCs): Less than or equal to 2.  RBC casts: none.  Epithelial cells: Few or 0-4 per low-power field.  Bacteria: none.  Yeast: none. Other results may  be reported based on the appearance and odor of the sample. For this test, normal results are:  Appearance: clear.  Color: amber yellow.  Odor: aromatic. Still other results may be reported as positive or negative. For this test, normal results are:  Negative for leukocyte esterase. What do the results mean? Many conditions can cause abnormal UA results:  Cloudy urine may be a sign of a  UTI.  Acetone odor may indicate a buildup of blood acids in people who have diabetes (diabetic ketoacidosis).  Fecal odor can indicate an abnormal connection (fistula) between the intestine and the bladder.  Ammonia odor can occur after a person holds urine in the bladder for too long.  Pungent odor may indicate a UTI.  Blood in the urine may be a sign of kidney disease, UTI, or other conditions.  White blood cells may be a sign of a UTI.  Crystals may be a sign of a kidney stone or other kidney disease.  High pH may mean you have a kidney stone, UTI, or kidney disease.  Protein may be a sign of kidney disease, high blood pressure in pregnancy (toxemia), or other conditions.  Glucose may be a sign of diabetes.  Urobilinogen may be a sign of liver disease.  Leukocyte esterase may indicate a UTI.  Nitrites may indicate a UTI. Talk with your health care provider about what your results mean. Questions to ask your health care provider Ask your health care provider, or the department that is doing the test:  When will my results be ready?  How will I get my results?  What are my treatment options?  What other tests do I need?  What are my next steps? Summary  A urinalysis (UA) is a series of tests done on a sample of your urine. The test may be ordered as part of a routine exam, during pregnancy, before surgery, or if you have certain symptoms.  The urinalysis is divided into three parts: a visual exam, a dipstick test, and a microscopic exam.  Your health care provider will compare your results to normal ranges that were established after testing a large group of people (reference ranges).  Talk with your health care provider about what your results mean. This information is not intended to replace advice given to you by your health care provider. Make sure you discuss any questions you have with your health care provider. Document Revised: 03/22/2020 Document Reviewed:  03/22/2020 Elsevier Patient Education  [employer].

## 2020-11-27 DIAGNOSIS — B373 Candidiasis of vulva and vagina: Secondary | ICD-10-CM | POA: Insufficient documentation

## 2020-11-27 DIAGNOSIS — B9689 Other specified bacterial agents as the cause of diseases classified elsewhere: Secondary | ICD-10-CM | POA: Insufficient documentation

## 2020-11-27 DIAGNOSIS — N760 Acute vaginitis: Secondary | ICD-10-CM | POA: Insufficient documentation

## 2020-11-27 DIAGNOSIS — B3731 Acute candidiasis of vulva and vagina: Secondary | ICD-10-CM | POA: Insufficient documentation

## 2020-11-27 LAB — CERVICOVAGINAL ANCILLARY ONLY
Bacterial Vaginitis (gardnerella): POSITIVE — AB
Candida Glabrata: NEGATIVE
Candida Vaginitis: POSITIVE — AB
Chlamydia: NEGATIVE
Comment: NEGATIVE
Comment: NEGATIVE
Comment: NEGATIVE
Comment: NEGATIVE
Comment: NEGATIVE
Comment: NORMAL
Neisseria Gonorrhea: NEGATIVE
Trichomonas: NEGATIVE

## 2020-11-27 MED ORDER — METRONIDAZOLE 0.75 % EX GEL: 1.0000 "application " | Freq: Every day | CUTANEOUS | 0 refills | Status: AC

## 2020-11-27 MED ORDER — TERCONAZOLE 0.8 % VA CREA: 1.0000 | TOPICAL_CREAM | Freq: Every day | VAGINAL | 0 refills | Status: AC

## 2020-11-27 NOTE — Progress Notes (Signed)
Gonorrhea, chlamydia, and trichomonas negative.  Vaginal swab positive for bacterial vaginosis, an overgrowth of normal bacteria in the vagina due to changes in pH often related to semen, menstrual periods, or soaps. Flagyl vaginal ointment prescribed.   Vaginal swab positive for yeast infection. Terazol vaginal cream prescribed.   Vaginal ointment and cream prescribed as patient reported to provider she is unable to take oral medications during Ramadan.

## 2020-11-27 NOTE — Addendum Note (Signed)
Addended by: Camillia Herter on: 11/27/2020 11:44 AM   Modules accepted: Orders

## 2021-01-19 ENCOUNTER — Ambulatory Visit
Admission: EM | Admit: 2021-01-19 | Discharge: 2021-01-19 | Disposition: A | Discharge status: Home / Self Care | Payer: Medicaid Other | Attending: Emergency Medicine | Admitting: Emergency Medicine

## 2021-01-19 ENCOUNTER — Other Ambulatory Visit: Payer: Self-pay

## 2021-01-19 DIAGNOSIS — J22 Unspecified acute lower respiratory infection: Secondary | ICD-10-CM | POA: Diagnosis not present

## 2021-01-19 LAB — POCT URINE PREGNANCY: Preg Test, Ur: NEGATIVE

## 2021-01-19 MED ORDER — PROMETHAZINE-DM 6.25-15 MG/5ML PO SYRP: 5.0000 mL | ORAL_SOLUTION | Freq: Every evening | ORAL | 0 refills | Status: DC | PRN

## 2021-01-19 MED ORDER — FLUTICASONE PROPIONATE 50 MCG/ACT NA SUSP: 1.0000 | Freq: Every day | NASAL | 0 refills | Status: DC

## 2021-01-19 MED ORDER — CETIRIZINE HCL 10 MG PO CAPS: 10.0000 mg | ORAL_CAPSULE | Freq: Every day | ORAL | 0 refills | Status: DC

## 2021-01-19 MED ORDER — BENZONATATE 200 MG PO CAPS: 200.0000 mg | ORAL_CAPSULE | Freq: Three times a day (TID) | ORAL | 0 refills | Status: AC | PRN

## 2021-01-19 MED ORDER — DOXYCYCLINE HYCLATE 100 MG PO CAPS: 100.0000 mg | ORAL_CAPSULE | Freq: Two times a day (BID) | ORAL | 0 refills | Status: AC

## 2021-01-19 NOTE — ED Triage Notes (Signed)
Pt here for cough and body aches x 5 days; pt sts negative home covid test; unsure of fever

## 2021-01-19 NOTE — Discharge Instructions (Addendum)
Doxycycline twice daily for 1 week Daily cetirizine and Flonase to help with congestion and drainage Tessalon for daytime cough, may use with Mucinex Phenergan DM for nighttime cough Rest and fluids Follow-up if not improving or worsening

## 2021-01-19 NOTE — ED Provider Notes (Signed)
[name]    CSN: [id number] Arrival date & time: 01/19/21  1514      History   Chief Complaint Chief Complaint  Patient presents with  . Cough    HPI Hannah Fleming is a 40 y.o. female presenting today for evaluation of cough.  Reports cough congestion and body aches over the past week.  Reports symptoms began last Monday and have been persistent over the past 7 days.  Reports continued generalized fatigue.  Denies any specific fevers.  At home COVID test negative.  Reports discomfort in the left side of her chest and left neck especially with coughing.  Reports a lot of postnasal drainage and mucus which has caused some nausea.  Reports 8 days late for her cycle.  HPI  Past Medical History:  Diagnosis Date  . Allergy    [employer] 02/29/2020  . Genital herpes    no outbreak in 43yrs  . Infection    UTI  . No pertinent past medical history   . Trichomonas infection   . Urticaria     Patient Active Problem List   Diagnosis Date Noted  . Bacterial vaginitis 11/27/2020  . Candida vaginitis 11/27/2020  . Prediabetes 06/21/2019  . Unspecified disorder of menstruation and other abnormal bleeding from female genital tract 12/28/2013  . Anxiety 02/07/2013  . Dysmenorrhea 02/07/2013    Past Surgical History:  Procedure Laterality Date  . DILATION AND CURETTAGE OF UTERUS    . INDUCED ABORTION    . WISDOM TOOTH EXTRACTION      OB History    Gravida  8   Para  3   Term  3   Preterm  0   AB  5   Living  3     SAB  1   IAB  3   Ectopic  0   Multiple  0   Live Births  3            Home Medications    Prior to Admission medications   Medication Sig Start Date End Date Taking? Authorizing Provider  benzonatate (TESSALON) 200 MG capsule Take 1 capsule (200 mg total) by mouth 3 (three) times daily as needed for up to 7 days for cough. 01/19/21 01/26/21 Yes Leshea Jaggers C, PA-C  Cetirizine HCl 10 MG CAPS Take 1 capsule (10 mg total)  by mouth daily for 10 days. 01/19/21 01/29/21 Yes Akasha Melena C, PA-C  doxycycline (VIBRAMYCIN) 100 MG capsule Take 1 capsule (100 mg total) by mouth 2 (two) times daily for 7 days. 01/19/21 01/26/21 Yes Charels Stambaugh C, PA-C  fluticasone (FLONASE) 50 MCG/ACT nasal spray Place 1-2 sprays into both nostrils daily. 01/19/21  Yes Camillo Quadros C, PA-C  promethazine-dextromethorphan (PROMETHAZINE-DM) 6.25-15 MG/5ML syrup Take 5 mLs by mouth at bedtime as needed for cough. 01/19/21  Yes Aniylah Avans C, PA-C  Acetaminophen-Codeine (TYLENOL/CODEINE #3) 300-30 MG tablet Take 1 tablet by mouth every 4 (four) hours as needed for pain (with menstrual cycles). 12/06/19   Nicolette Bang, DO  CRANBERRY PO Take 1 tablet by mouth daily.    [provider]  IBUPROFEN PO Take by mouth as needed.    [provider]  Multiple Vitamins-Minerals (MULTIVITAMIN ADULT) TABS Take 1 tablet by mouth daily.    [provider]    Family History Family History  Problem Relation Age of Onset  . Healthy Mother   . Sickle cell trait Mother   . Healthy Father   .  Sickle cell trait Sister   . Sickle cell trait Brother   . Healthy Daughter   . Healthy Son   . Healthy Son   . Other Neg Hx     Social History Social History   Tobacco Use  . Smoking status: Former Smoker    Types: Cigarettes    Quit date: 02/07/2009    Years since quitting: 11.9  . Smokeless tobacco: Never Used  Vaping Use  . Vaping Use: Never used  Substance Use Topics  . Alcohol use: Yes    Alcohol/week: 0.0 standard drinks    Comment: occasional  . Drug use: No     Allergies   Prilosec otc [omeprazole magnesium] and Latex   Review of Systems Review of Systems  Constitutional: Negative for activity change, appetite change, chills, fatigue and fever.  HENT: Positive for congestion and rhinorrhea. Negative for ear pain, sinus pressure, sore throat and trouble swallowing.   Eyes: Negative for discharge  and redness.  Respiratory: Positive for cough. Negative for chest tightness and shortness of breath.   Cardiovascular: Negative for chest pain.  Gastrointestinal: Negative for abdominal pain, diarrhea, nausea and vomiting.  Musculoskeletal: Negative for myalgias.  Skin: Negative for rash.  Neurological: Negative for dizziness, light-headedness and headaches.     Physical Exam Triage Vital Signs ED Triage Vitals [01/19/21 1525]  Enc Vitals Group     BP 115/77     Pulse Rate 95     Resp 18     Temp 98.3 F (36.8 C)     Temp Source Oral     SpO2 95 %     Weight      Height      Head Circumference      Peak Flow      Pain Score 6     Pain Loc      Pain Edu?      Excl. in [place]?    No data found.  Updated Vital Signs BP 115/77 (BP Location: Left Arm)   Pulse 95   Temp 98.3 F (36.8 C) (Oral)   Resp 18   SpO2 95%   Visual Acuity Right Eye Distance:   Left Eye Distance:   Bilateral Distance:    Right Eye Near:   Left Eye Near:    Bilateral Near:     Physical Exam Vitals and nursing note reviewed.  Constitutional:      Appearance: She is well-developed.     Comments: No acute distress  HENT:     Head: Normocephalic and atraumatic.     Ears:     Comments: Bilateral ears without tenderness to palpation of external auricle, tragus and mastoid, EAC's without erythema or swelling, TM's with good bony landmarks and cone of light. Non erythematous.     Nose: Nose normal.     Mouth/Throat:     Comments: Oral mucosa pink and moist, no tonsillar enlargement or exudate. Posterior pharynx patent and nonerythematous, no uvula deviation or swelling. Normal phonation. Eyes:     Conjunctiva/sclera: Conjunctivae normal.  Neck:     Comments: Left anterior neck tender to palpation without overlying erythema or swelling, no lymphadenopathy Cardiovascular:     Rate and Rhythm: Normal rate and regular rhythm.  Pulmonary:     Effort: Pulmonary effort is normal. No respiratory  distress.     Comments: Breathing comfortably at rest, CTABL, no wheezing, rales or other adventitious sounds auscultated Abdominal:     General: There is no distension.  Musculoskeletal:        General: Normal range of motion.     Cervical back: Neck supple.  Skin:    General: Skin is warm and dry.  Neurological:     Mental Status: She is alert and oriented to person, place, and time.      UC Treatments / Results  Labs (all labs ordered are listed, but only abnormal results are displayed) Labs Reviewed  POCT URINE PREGNANCY    EKG   Radiology No results found.  Procedures Procedures (including critical care time)  Medications Ordered in UC Medications - No data to display  Initial Impression / Assessment and Plan / UC Course  I have reviewed the triage vital signs and the nursing notes.  Pertinent labs & imaging results that were available during my care of the patient were reviewed by me and considered in my medical decision making (see chart for details).     Pregnancy test negative  URI symptoms/cough x1 week, continue symptomatic and supportive care of congestion and cough, cetirizine and Flonase, Tessalon for daytime cough, Phenergan DM for nighttime cough, given length of symptoms opting to go ahead and cover sinusitis/lower respiratory infection with doxycycline.  Rest and fluids, continue to monitor,Discussed strict return precautions. Patient verbalized understanding and is agreeable with plan.  Final Clinical Impressions(s) / UC Diagnoses   Final diagnoses:  Lower respiratory infection (e.g., bronchitis, pneumonia, pneumonitis, pulmonitis)     Discharge Instructions     Doxycycline twice daily for 1 week Daily cetirizine and Flonase to help with congestion and drainage Tessalon for daytime cough, may use with Mucinex Phenergan DM for nighttime cough Rest and fluids Follow-up if not improving or worsening    ED Prescriptions    Medication Sig  Dispense Auth. Provider   fluticasone (FLONASE) 50 MCG/ACT nasal spray Place 1-2 sprays into both nostrils daily. 16 g Maison Kestenbaum C, PA-C   Cetirizine HCl 10 MG CAPS Take 1 capsule (10 mg total) by mouth daily for 10 days. 10 capsule Quantarius Genrich C, PA-C   benzonatate (TESSALON) 200 MG capsule Take 1 capsule (200 mg total) by mouth 3 (three) times daily as needed for up to 7 days for cough. 28 capsule Renaye Janicki C, PA-C   promethazine-dextromethorphan (PROMETHAZINE-DM) 6.25-15 MG/5ML syrup Take 5 mLs by mouth at bedtime as needed for cough. 118 mL Cristiano Capri C, PA-C   doxycycline (VIBRAMYCIN) 100 MG capsule Take 1 capsule (100 mg total) by mouth 2 (two) times daily for 7 days. 14 capsule Kanton Kamel, [place] C, PA-C     PDMP not reviewed this encounter.   Janith Lima, [place] 01/19/21 1627
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
## 2021-02-26 ENCOUNTER — Other Ambulatory Visit: Payer: Self-pay

## 2021-02-26 ENCOUNTER — Ambulatory Visit: Payer: Medicaid Other | Admitting: Physician Assistant

## 2021-02-26 ENCOUNTER — Encounter: Payer: Self-pay | Admitting: Physician Assistant

## 2021-02-26 VITALS — BP 116/81 | HR 83 | Ht 64.0 in | Wt 197.2 lb

## 2021-02-26 DIAGNOSIS — L509 Urticaria, unspecified: Secondary | ICD-10-CM

## 2021-02-26 DIAGNOSIS — M7061 Trochanteric bursitis, right hip: Secondary | ICD-10-CM

## 2021-02-26 DIAGNOSIS — R7303 Prediabetes: Secondary | ICD-10-CM

## 2021-02-26 DIAGNOSIS — M7918 Myalgia, other site: Secondary | ICD-10-CM | POA: Diagnosis not present

## 2021-02-26 DIAGNOSIS — M19041 Primary osteoarthritis, right hand: Secondary | ICD-10-CM

## 2021-02-26 DIAGNOSIS — F419 Anxiety disorder, unspecified: Secondary | ICD-10-CM | POA: Diagnosis not present

## 2021-02-26 DIAGNOSIS — M19072 Primary osteoarthritis, left ankle and foot: Secondary | ICD-10-CM

## 2021-02-26 DIAGNOSIS — M170 Bilateral primary osteoarthritis of knee: Secondary | ICD-10-CM | POA: Diagnosis not present

## 2021-02-26 DIAGNOSIS — M62838 Other muscle spasm: Secondary | ICD-10-CM

## 2021-02-26 DIAGNOSIS — M19042 Primary osteoarthritis, left hand: Secondary | ICD-10-CM

## 2021-02-26 DIAGNOSIS — M47816 Spondylosis without myelopathy or radiculopathy, lumbar region: Secondary | ICD-10-CM

## 2021-02-26 DIAGNOSIS — M4126 Other idiopathic scoliosis, lumbar region: Secondary | ICD-10-CM

## 2021-02-26 DIAGNOSIS — R768 Other specified abnormal immunological findings in serum: Secondary | ICD-10-CM

## 2021-02-26 DIAGNOSIS — N946 Dysmenorrhea, unspecified: Secondary | ICD-10-CM

## 2021-02-26 DIAGNOSIS — M19071 Primary osteoarthritis, right ankle and foot: Secondary | ICD-10-CM | POA: Diagnosis not present

## 2021-02-26 DIAGNOSIS — M7062 Trochanteric bursitis, left hip: Secondary | ICD-10-CM

## 2021-02-28 LAB — SEDIMENTATION RATE: Sed Rate: 6 mm/h (ref 0–20)

## 2021-02-28 LAB — BETA-2 GLYCOPROTEIN ANTIBODIES
Beta-2 Glyco 1 IgA: <2 U/mL
Beta-2 Glyco 1 IgM: <2 U/mL
Beta-2 Glyco I IgG: <2 U/mL

## 2021-02-28 LAB — RNP ANTIBODY: Ribonucleic Protein(ENA) Antibody, IgG: 2.6 AI — AB

## 2021-02-28 LAB — CARDIOLIPIN ANTIBODIES, IGG, IGM, IGA
Anticardiolipin IgA: <2 APL-U/mL
Anticardiolipin IgG: <2 GPL-U/mL
Anticardiolipin IgM: <2 MPL-U/mL

## 2021-02-28 LAB — C3 AND C4
C3 Complement: 141 mg/dL (ref 83–193)
C4 Complement: 35 mg/dL (ref 15–57)

## 2021-02-28 LAB — LUPUS ANTICOAGULANT EVAL W/ REFLEX
PTT-LA Screen: 42 s — ABNORMAL HIGH (ref <=40)
dRVVT: 38 s (ref <=45)

## 2021-02-28 LAB — ANTI-DNA ANTIBODY, DOUBLE-STRANDED: ds DNA Ab: <1 IU/mL

## 2021-02-28 LAB — ANA: Anti Nuclear Antibody (ANA): NEGATIVE

## 2021-02-28 LAB — RFLX HEXAGONAL PHASE CONFIRM: Hexagonal Phase Conf: NEGATIVE

## 2021-02-28 NOTE — Progress Notes (Signed)
RNP remains positive.  ANA negative.  Lupus anticoagulant negative.  Beta-2 glycoprotein negative and anticardiolipin abs negative.  dsDNA is negative.  Complements and ESR WNL.

## 2021-04-10 ENCOUNTER — Ambulatory Visit: Payer: Medicaid Other | Admitting: Obstetrics and Gynecology

## 2021-04-10 NOTE — Progress Notes (Signed)
[name] New Patient Evaluation and Consultation  Referring Provider: Camillia Herter, NP PCP: Hannah Bang, MD Date of Service: 04/11/2021  SUBJECTIVE Chief Complaint: New Patient (Initial Visit)- urine odor  History of Present Illness: Hannah Fleming is a 40 y.o. Black or African-American female seen in consultation at the request of NP Minette Brine for evaluation of abnormal urine odor..    Review of records significant for: C/o abnormal urine odor- urinalysis negative. Vaginal swab positive for BV and yeast.   Urinary Symptoms: Does not leak urine.   Day time voids 6.  Nocturia: 3 times per night to void. Voiding dysfunction: she empties her bladder well.  does not use a catheter to empty bladder.  When urinating, she feels she has no difficulties Drinks: 1 cup Sweet tea, cranberry juice, mostly water per day Notices her urine has an odor on and off.  Taking cranberry tablets to prevent infection.   UTIs:  0  UTI's in the last year.   Denies history of blood in urine and kidney or bladder stones  Pelvic Organ Prolapse Symptoms:                  She Denies a feeling of a bulge the vaginal area.   Bowel Symptom: Bowel movements: 2 time(s) per day Stool consistency: soft  or loose Straining: no.  Splinting: no.  Incomplete evacuation: no.  She Admits to accidental bowel leakage / fecal incontinence  Occurs: on and off, started when she had the flu  Consistency with leakage: liquid Bowel regimen: none Last colonoscopy: none  Sexual Function Sexually active: yes.  Sexual orientation: Straight Pain with sex: Yes, deep in the pelvis Helps to change positions. Not that bothersome to her.   Pelvic Pain Denies pelvic pain   Past Medical History:  Past Medical History:  Diagnosis Date   Allergy    Phreesia 02/29/2020   Genital herpes    no outbreak in 23yr   Infection    UTI   No pertinent past medical history    Trichomonas  infection    Urticaria      Past Surgical History:   Past Surgical History:  Procedure Laterality Date   DILATION AND CURETTAGE OF UTERUS     INDUCED ABORTION     WISDOM TOOTH EXTRACTION       Past OB/GYN History: OB History  Gravida Para Term Preterm AB Living  '8 3 3 '$ 0 5 3  SAB IAB Ectopic Multiple Live Births  2 2 0 0 3    # Outcome Date GA Lbr Len/2nd Weight Sex Delivery Anes PTL Lv  8 AB 01/10/19 [id number]  SAB     7 SAB           6 SAB              Birth Comments: System Generated. Please review and update pregnancy details.  5 IAB           4 Term     M Vag-Spont   LIV  3 Term     M Vag-Spont   LIV  2 IAB           1 Term     F Vag-Spont   LIV    LMP Patient's last menstrual period was 04/09/2021. Contraception: none. Last pap smear was 2019- negative.    Medications: She has a current medication list which includes the following prescription(s): acetaminophen, cranberry, ibuprofen, multivitamin adult,  and cetirizine hcl.   Allergies: Patient is allergic to prilosec otc [omeprazole magnesium] and latex.   Social History:  Social History   Tobacco Use   Smoking status: Former    Types: Cigarettes    Quit date: 02/07/2009    Years since quitting: 12.1   Smokeless tobacco: Never  Vaping Use   Vaping Use: Never used  Substance Use Topics   Alcohol use: Yes    Alcohol/week: 0.0 standard drinks    Comment: occasional   Drug use: No    Relationship status: single She lives with 3 sons.   She is employed - works Scientist, research (medical). Regular exercise: Yes: walking History of abuse: No  Family History:   Family History  Problem Relation Age of Onset   Healthy Mother    Sickle cell trait Mother    Healthy Father    Sickle cell trait Sister    Sickle cell trait Brother    Healthy Daughter    Healthy Son    Healthy Son    Other Neg Hx      Review of Systems: Review of Systems  Constitutional:  Negative for fever, malaise/fatigue and weight loss.   Respiratory:  Positive for cough. Negative for shortness of breath and wheezing.   Cardiovascular:  Negative for chest pain, palpitations and leg swelling.  Gastrointestinal:  Negative for abdominal pain and blood in stool.  Genitourinary:  Negative for dysuria.       + abnormal periods  Musculoskeletal:  Negative for myalgias.  Skin:  Negative for rash.  Neurological:  Negative for dizziness and headaches.  Endo/Heme/Allergies:  Does not bruise/bleed easily.  Psychiatric/Behavioral:  Negative for depression. The patient is not nervous/anxious.     OBJECTIVE Physical Exam: Vitals:   04/11/21 1003  BP: 104/73  Pulse: (!) 57  Weight: 201 lb (91.2 kg)  Height: '5\' 4"'$  (1.626 m)    Physical Exam Constitutional:      General: She is not in acute distress. Pulmonary:     Effort: Pulmonary effort is normal.  Abdominal:     General: There is no distension.     Palpations: Abdomen is soft.     Tenderness: There is no abdominal tenderness. There is no rebound.  Musculoskeletal:        General: No swelling. Normal range of motion.  Skin:    General: Skin is warm and dry.     Findings: No rash.  Neurological:     Mental Status: She is alert and oriented to person, place, and time.  Psychiatric:        Mood and Affect: Mood normal.        Behavior: Behavior normal.     GU / Detailed Urogynecologic Evaluation:  Pelvic Exam: Normal external female genitalia; Bartholin's and Skene's glands normal in appearance; urethral meatus normal in appearance, no urethral masses or discharge.   CST: negative   Speculum exam reveals normal vaginal mucosa without atrophy, blood present in vaginal vault. Cervix normal appearance. Uterus normal single, nontender. Adnexa no mass, fullness, tenderness.     Pelvic floor strength III/V  Pelvic floor musculature: Right levator non-tender, Right obturator non-tender, Left levator non-tender, Left obturator non-tender  POP-Q:   POP-Q  -6                                             Aa   -  6                                           Ba  -6                                              C   2.5                                            Gh  3.5                                            Pb  8                                            tvl   -2                                            Ap  -2                                            Bp  -7.5                                              D     Rectal Exam:  Normal sphincter tone  Post-Void Residual (PVR) by Bladder Scan: In order to evaluate bladder emptying, we discussed obtaining a postvoid residual and she agreed to this procedure.  Procedure: The ultrasound unit was placed on the patient's abdomen in the suprapubic region after the patient had voided. A PVR of 14 ml was obtained by bladder scan.  Laboratory Results: POC urine: negative   ASSESSMENT AND PLAN Ms. Ruckman is a 40 y.o. with:  1. Incontinence of feces, unspecified fecal incontinence type   2. Abnormal urine odor   3. Urinary frequency    Fecal incontinence - Treatment options include anti-diarrhea medication (loperamide/ Imodium OTC or prescription lomotil), fiber supplements, physical therapy, and possible sacral neuromodulation or surgery.   - She will start with psyllium fiber supplement daily.   2. Abnormal urine odor - No s/s concerning for infection.  - We discussed how certain foods/ beverages can change the urine odor - Also she did have a vaginal infection at the time of abnormal odor so this can also affect the smell.    Return 2 months for follow up   Jaquita Folds, MD   Medical Decision Making:  - Reviewed/ ordered a clinical laboratory test - Independent review of image, tracing or specimen

## 2021-04-11 ENCOUNTER — Encounter: Payer: Self-pay | Admitting: Obstetrics and Gynecology

## 2021-04-11 ENCOUNTER — Ambulatory Visit (INDEPENDENT_AMBULATORY_CARE_PROVIDER_SITE_OTHER): Payer: Medicaid Other | Admitting: Obstetrics and Gynecology

## 2021-04-11 ENCOUNTER — Other Ambulatory Visit: Payer: Self-pay

## 2021-04-11 VITALS — BP 104/73 | HR 57 | Ht 64.0 in | Wt 201.0 lb

## 2021-04-11 DIAGNOSIS — R8290 Unspecified abnormal findings in urine: Secondary | ICD-10-CM | POA: Diagnosis not present

## 2021-04-11 DIAGNOSIS — R350 Frequency of micturition: Secondary | ICD-10-CM | POA: Diagnosis not present

## 2021-04-11 DIAGNOSIS — R159 Full incontinence of feces: Secondary | ICD-10-CM | POA: Diagnosis not present

## 2021-04-11 LAB — POCT URINALYSIS DIPSTICK
Appearance: NORMAL
Bilirubin, UA: NEGATIVE
Blood, UA: NEGATIVE
Glucose, UA: NEGATIVE
Ketones, UA: NEGATIVE
Leukocytes, UA: NEGATIVE
Nitrite, UA: NEGATIVE
Protein, UA: NEGATIVE
Spec Grav, UA: 1.015 (ref 1.010–1.025)
Urobilinogen, UA: 0.2 E.U./dL
pH, UA: 5.5 (ref 5.0–8.0)

## 2021-04-11 NOTE — Patient Instructions (Addendum)
Accidental Bowel Leakage: Our goal is to achieve formed bowel movements daily or every-other-day without leakage.  You may need to try different combinations of the following options to find what works best for you.  Some management options include: Dietary changes (more leafy greens, vegetables and fruits; less processed foods) Fiber supplementation (Metamucil or something with psyllium as active ingredient) Over-the-counter imodium (tablets or liquid) to help solidify the stool and prevent leakage of stool.   Foods that can Irritate the bladder:  The Most Bothersome Foods* The Least Bothersome Foods*  Coffee - Regular & Decaf Tea - caffeinated Carbonated beverages - cola, non-colas, diet & caffeine-free Alcohols - Beer, Red Wine, White Wine, Champagne Fruits - Grapefruit, [place], Orange, [employer] - Cranberry, Grapefruit, Orange, Pineapple Vegetables - Tomato & Tomato Products Flavor Enhancers - Hot peppers, Spicy foods, Chili, Horseradish, Vinegar, Monosodium glutamate (MSG) Artificial Sweeteners - NutraSweet, Sweet 'N Low, Equal (sweetener), Saccharin Ethnic foods - [place], [place], [place] food Express Scripts - low-fat & whole Fruits - Bananas, Blueberries, Honeydew melon, Pears, Raisins, Watermelon Vegetables - Broccoli, Brussels Sprouts, [place], Carrots, Cauliflower, [place], Cucumber, Mushrooms, Peas, Radishes, Squash, Zucchini, White potatoes, Sweet potatoes & yams Poultry - Chicken, Eggs, [place], [employer] - Beef, Programmer, multimedia, Lamb Seafood - Shrimp, [place] [employer] fish, Salmon Grains - Oat, Rice Snacks - Pretzels, Popcorn  *Lissa Morales et al. Diet and its role in interstitial cystitis/bladder pain syndrome (IC/BPS) and comorbid conditions. [place] 2012 Jan 11.

## 2021-04-15 ENCOUNTER — Other Ambulatory Visit: Payer: Self-pay | Admitting: *Deleted

## 2021-04-15 NOTE — Patient Instructions (Signed)
Visit Information  Hannah Fleming was given information about Medicaid Managed Care team care coordination services as a part of their [place] Medicaid benefit. Hannah Fleming verbally consented to engagement with the [facility] Managed Care team.   If you are experiencing a medical emergency, please call 911 or report to your local emergency department or urgent care.   If you have a non-emergency medical problem during routine business hours, please contact your provider's office and ask to speak with a nurse.   For questions related to your [facility], please call: [phone] or visit the homepage here: [email or web address]  If you would like to schedule transportation through your [facility], please call the following number at least 2 days in advance of your appointment: [phone].   Call the [place] at [phone], at any time, 24 hours a day, 7 days a week. If you are in danger or need immediate medical attention call 911.  If you would like help to quit smoking, call 1-800-QUIT-NOW 502-227-3483) OR Espaol: 1-855-Djelo-Ya HD:1601594) o para ms informacin haga clic aqu or Text READY to 200-400 to register via text  Hannah Fleming - following are the goals we discussed in your visit today:   Goals Addressed             This Visit's Progress    Cope with Chronic Pain       Timeframe:  Long-Range Goal Priority:  Medium Start Date:     04/15/21                        Expected End Date:    onging                   Follow Up Date 05/13/21    - learn relaxation techniques - practice acceptance of chronic pain - practice relaxation or meditation daily - spend time with positive people - think of new ways to do favorite things - use distraction techniques - use relaxation during pain    Why is this important?   Stress  makes chronic pain feel worse.  Feelings like depression, anxiety, stress and anger can make your body more sensitive to pain.  Learning ways to cope with stress or depression may help you find some relief from the pain.     Notes:      Find Help in My Community       Timeframe:  Short-Term Goal Priority:  High Start Date:       04/15/21                      Expected End Date:    ongoing                   Follow Up Date 05/13/21    - follow-up on any referrals for help I am given    Why is this important?   Knowing how and where to find help for yourself or family in your neighborhood and community is an important skill.  You will want to take some steps to learn how.    Notes:      Learn More About My Health       Timeframe:  Long-Range Goal Priority:  High Start Date:   04/15/21  Expected End Date:     ongoing                   Follow Up Date 05/13/21    - make a list of questions - ask questions - review educational material mailed to me   - call my health plan customer service number to secure replacement card and to find out about benefits realted to weight management, phone program and earning money via completing wellness activities  - review information mailed to me about health plan benefits   Why is this important?   The best way to learn about your health and care is by talking to the doctor and nurse.  They will answer your questions and give you information in the way that you like best.    Notes:         Please see education materials related to prediabetes and chronic pain provided as print materials.   The patient verbalized understanding of instructions provided today and agreed to receive a mailed copy of patient instruction and/or educational materials.  Telephone follow up appointment with Managed Medicaid care management team member scheduled for:05/13/21 at 10:00 am  Kelli Churn RN, CCM, [place] Management Coordinator - Managed [place] High Risk [phone]   Following is a copy of your plan of care:  Patient Care Plan: RN Care Manager Plan Of Care     Problem Identified: Chronic Disease Management and Care Coordination Needs      Long-Range Goal: Development of Plan Of Care For Chronic Disease Management and Care Coordination Needs To Assist With Meeting Treatment Goals   Start Date: 04/15/2021  Expected End Date: 08/13/2021  Priority: High  Note:   Current Barriers:  Care Coordination needs related to Financial constraints related to unable to pay rent  Chronic Disease Management support and education needs related to prediabetes, obesity, chronic pain  RNCM Clinical Goal(s):  Patient will verbalize basic understanding of prediabetes, obesity and chronic pain disease process and self health management plan .  take all medications exactly as prescribed and will call provider for medication related questions attend all scheduled medical appointments: with primary care and specialist demonstrate ongoing adherence to prescribed treatment plan for prediabetes, obesity and chronic pain as evidenced by exercise 5-7 days/week adherence to prescribed medication regimen contacting provider for new or worsened symptoms or questions . continue to work with RN Care Manager to address care management and care coordination needs related to prediabetes, obesity and chronic pain  work with Education officer, museum to address Financial constraints related to inability to pay rent and household bills related to the management of prediabetes, obesity and chronic pain  through collaboration with Consulting civil engineer, provider, and care team.   Interventions: Inter-disciplinary care team collaboration (see longitudinal plan of care) Evaluation of current treatment plan related to  self management and patient's adherence to plan as established by provider   Interdisciplinary  Collaboration:  (Status: New goal.) Collaborated with BSW to initiate plan of care to address needs related to Financial constraints related to paying rent and household bills  in patient with  prediabetes, obesity and chronic pain Inter-disciplinary care team collaboration (see longitudinal plan of care)   Weight Loss:  (Status: New goal.) Provided patient with contact information for managed Medicaid health plan customer service phone number about weight management benefits   PreDiabetes:  (Status: New goal.) Lab Results  Component Value Date   HGBA1C 5.7 (H)  12/06/2019  Assessed patient's understanding of A1c goal:  <5.7% Provided education to patient about basic prediabetes disease process; Reviewed prescribed diet with patient CHO modified; Counseled on importance of regular laboratory monitoring as prescribed;        Discussed plans with patient for ongoing care management follow up and provided patient with direct contact information for care management team;       Pain:  (Status: New goal.) Pain assessment performed Medications reviewed Reviewed provider established plan for pain management; Discussed importance of adherence to all scheduled medical appointments; Counseled on the importance of reporting any/all new or changed pain symptoms or management strategies to pain management provider; Reviewed with patient prescribed pharmacological and nonpharmacological pain relief strategies; Provided written health plan information related to alternative healing benefits such as  massage and acupuncture  Patient Goals/Self-Care Activities: Patient will self administer medications as prescribed Patient will attend all scheduled provider appointments Patient will continue to perform ADL's independently Patient will continue to perform IADL's independently Patient will call provider office for new concerns or questions Patient will work with BSW to address care coordination needs and  will continue to work with the clinical team to address health care and disease management related needs.

## 2021-04-15 NOTE — Patient Outreach (Signed)
Medicaid Managed Care   Nurse Care Manager Note  04/15/2021 Name:  Hannah Fleming MRN:  [id number] DOB:  [birth year]  Hannah Fleming is an 40 y.o. year old female who is a primary patient of Juleen China, Bayard Beaver, MD.  The [facility] Managed Care Coordination team was consulted for assistance with:    Chronic pain, obesity and prediabetes.  Hannah Fleming was given information about Medicaid Managed Care Coordination team services today. [name] Patient agreed to services and verbal consent obtained.  Engaged with patient by telephone for initial visit in response to provider referral for case management and/or care coordination services.   Assessments/Interventions:  Review of past medical history, allergies, medications, health status, including review of consultants reports, laboratory and other test data, was performed as part of comprehensive evaluation and provision of chronic care management services.  SDOH (Social Determinants of Health) assessments and interventions performed: SDOH Interventions    Flowsheet Row Most Recent Value  SDOH Interventions   Financial Strain Interventions --  [referred to HR MM BSW]  Housing Interventions --  [referred patient to HR MM BSW]  Physical Activity Interventions Intervention Not Indicated  Transportation Interventions Intervention Not Indicated       Care Plan  Allergies  Allergen Reactions   Prilosec Otc [Omeprazole Magnesium] Hives   Latex Itching and Rash    Medications Reviewed Today     Reviewed by Barrington Ellison, RN (Registered Nurse) on 04/15/21 at [place] List Status: <None>   Medication Order Taking? Sig Documenting Provider Last Dose Status Informant  Acetaminophen (TYLENOL PO) [id number] Yes Take by mouth as needed. [provider] Taking Active   cetirizine (ZYRTEC) 10 MG tablet [id number] Yes Take 10 mg by mouth daily. [provider] Taking Active Self  Cetirizine HCl 10 MG CAPS  [id number]  Take 1 capsule (10 mg total) by mouth daily for 10 days. Wieters, Hallie C, PA-C  Expired 01/29/21 2359   CRANBERRY PO [id number] Yes Take 1 tablet by mouth daily. [provider] Taking Active   IBUPROFEN PO [id number] Yes Take 800 mg by mouth as needed. [provider] Taking Active Self  Multiple Vitamins-Minerals (MULTIVITAMIN ADULT) TABS [id number] Yes Take 1 tablet by mouth daily. [provider] Taking Active Self            Patient Active Problem List   Diagnosis Date Noted   Bacterial vaginitis 11/27/2020   Candida vaginitis 11/27/2020   Prediabetes 06/21/2019   Unspecified disorder of menstruation and other abnormal bleeding from female genital tract 12/28/2013   Anxiety 02/07/2013   Dysmenorrhea 02/07/2013    Conditions to be addressed/monitored per PCP order:   chronic pain,  obesity and  prediabetes.  Care Plan : RN Care Manager Plan Of Care  Updates made by Barrington Ellison, RN since 04/15/2021 12:00 AM     Problem: Chronic Disease Management and Care Coordination Needs      Long-Range Goal: Development of Plan Of Care For Chronic Disease Management and Care Coordination Needs To Assist With Meeting Treatment Goals   Start Date: 04/15/2021  Expected End Date: 08/13/2021  Priority: High  Note:   Current Barriers:  Care Coordination needs related to Financial constraints related to unable to pay rent  Chronic Disease Management support and education needs related to prediabetes, obesity, chronic pain  RNCM Clinical Goal(s):  Patient will verbalize basic understanding of prediabetes, obesity and chronic pain disease process and self health management plan .  take all medications exactly as prescribed and will call provider for medication related questions attend all scheduled medical appointments: with primary care and specialist demonstrate ongoing adherence to prescribed treatment plan for prediabetes, obesity and chronic pain as  evidenced by exercise 5-7 days/week adherence to prescribed medication regimen contacting provider for new or worsened symptoms or questions . continue to work with RN Care Manager to address care management and care coordination needs related to prediabetes, obesity and chronic pain  work with Education officer, museum to address Financial constraints related to inability to pay rent and household bills related to the management of prediabetes, obesity and chronic pain  through collaboration with Consulting civil engineer, provider, and care team.   Interventions: Inter-disciplinary care team collaboration (see longitudinal plan of care) Evaluation of current treatment plan related to  self management and patient's adherence to plan as established by provider   Interdisciplinary Collaboration:  (Status: New goal.) Collaborated with BSW to initiate plan of care to address needs related to Financial constraints related to paying rent and household bills  in patient with  prediabetes, obesity and chronic pain Inter-disciplinary care team collaboration (see longitudinal plan of care)   Weight Loss:  (Status: New goal.) Provided patient with contact information for managed Medicaid health plan customer service phone number about weight management benefits   PreDiabetes:  (Status: New goal.) Lab Results  Component Value Date   HGBA1C 5.7 (H) 12/06/2019  Assessed patient's understanding of A1c goal:  <5.7% Provided education to patient about basic prediabetes disease process; Provided written information on prediabetes eating plan Counseled on importance of regular laboratory monitoring as prescribed;        Discussed plans with patient for ongoing care management follow up and provided patient with direct contact information for care management team;       Pain:  (Status: New goal.) Pain assessment performed Medications reviewed Reviewed provider established plan for pain management; Discussed importance of  adherence to all scheduled medical appointments; Counseled on the importance of reporting any/all new or changed pain symptoms or management strategies to pain management provider; Reviewed with patient prescribed pharmacological and nonpharmacological pain relief strategies; Provided written health plan information related to alternative healing benefits such as  massage and acupuncture  Patient Goals/Self-Care Activities: Patient will self administer medications as prescribed Patient will attend all scheduled provider appointments Patient will continue to perform ADL's independently Patient will continue to perform IADL's independently Patient will call provider office for new concerns or questions Patient will work with BSW to address care coordination needs and will continue to work with the clinical team to address health care and disease management related needs.         Follow Up:  Patient agrees to Care Plan and Follow-up.  Plan: The Managed Medicaid care management team will reach out to the patient again over the next 30 days.  Date/time of next scheduled RN care management/care coordination outreach:  05/13/21  Kelli Churn RN, CCM, [place] Management Coordinator - Managed [place] High Risk [phone]

## 2021-04-16 ENCOUNTER — Telehealth: Payer: Self-pay | Admitting: Internal Medicine

## 2021-04-16 NOTE — Telephone Encounter (Signed)
..   Medicaid Managed Care   Unsuccessful Outreach Note  04/16/2021 Name: Hannah Fleming MRN: [id number] DOB: November 28, 1980  Referred by: Nicolette Bang, MD Reason for referral : High Risk Managed Medicaid (I called patient to get her scheduled for aphone visit with the  MM BSW. I left my name and number on her VM.)   An unsuccessful telephone outreach was attempted today. The patient was referred to the case management team for assistance with care management and care coordination.   Follow Up Plan: The care management team will reach out to the patient again over the next 7-14 days.   [place]

## 2021-04-18 ENCOUNTER — Telehealth: Payer: Self-pay | Admitting: Internal Medicine

## 2021-04-18 NOTE — Telephone Encounter (Signed)
..   Medicaid Managed Care   Unsuccessful Outreach Note  04/18/2021 Name: Hannah Fleming MRN: [id number] DOB: 1980-10-12  Referred by: Nicolette Bang, MD Reason for referral : High Risk Managed Medicaid (I called the patient today to get her scheduled for a phone visit  with the [facility] BSW. I left my name and number on her VM.)   A second unsuccessful telephone outreach was attempted today. The patient was referred to the case management team for assistance with care management and care coordination.   Follow Up Plan: The care management team will reach out to the patient again over the next 7-14 days.   [place]

## 2021-04-23 ENCOUNTER — Telehealth: Payer: Self-pay | Admitting: Internal Medicine

## 2021-04-23 NOTE — Telephone Encounter (Signed)
..   Medicaid Managed Care   Unsuccessful Outreach Note  04/23/2021 Name: Hannah Fleming MRN: [id number] DOB: 1981/05/24  Referred by: Nicolette Bang, MD Reason for referral : High Risk Managed Medicaid (I called the patient today to get her scheduled for a phone visit with the MM BSW. I left my name and number on her VM.)   Third unsuccessful telephone outreach was attempted today. The patient was referred to the case management team for assistance with care management and care coordination. The patient's primary care provider has been notified of our unsuccessful attempts to make or maintain contact with the patient. The care management team is pleased to engage with this patient at any time in the future should he/she be interested in assistance from the care management team.   Follow Up Plan: We have been unable to make contact with the patient for follow up. The care management team is available to follow up with the patient after provider conversation with the patient regarding recommendation for care management engagement and subsequent re-referral to the care management team.   [place], [place]

## 2021-05-13 ENCOUNTER — Other Ambulatory Visit: Payer: Self-pay | Admitting: *Deleted

## 2021-05-13 ENCOUNTER — Telehealth: Payer: Self-pay

## 2021-05-13 NOTE — Patient Instructions (Signed)
Visit Information  [name] was given information about Medicaid Managed Care team care coordination services as a part of their [place] Medicaid benefit. Hannah Fleming verbally consented to engagement with the [facility] Managed Care team.   If you are experiencing a medical emergency, please call 911 or report to your local emergency department or urgent care.   If you have a non-emergency medical problem during routine business hours, please contact your provider's office and ask to speak with a nurse.   For questions related to your [facility], please call: [phone] or visit the homepage here: [email or web address]  If you would like to schedule transportation through your [facility], please call the following number at least 2 days in advance of your appointment: [phone].   Call the [place] at [phone], at any time, 24 hours a day, 7 days a week. If you are in danger or need immediate medical attention call 911.  If you would like help to quit smoking, call 1-800-QUIT-NOW (225)607-2558) OR Espaol: 1-855-Djelo-Ya (2-725-366-4403) o para ms informacin haga clic aqu or Text READY to 200-400 to register via text  Hannah Fleming - following are the goals we discussed in your visit today:   Goals Addressed   None     Social Worker will follow up in 30 days.   Mickel Fuchs, BSW, [place]  High Risk Managed Medicaid Team  [phone]   Following is a copy of your plan of care:  Patient Care Plan: RN Care Manager Plan Of Care     Problem Identified: Chronic Disease Management and Care Coordination Needs      Long-Range Goal: Development of Plan Of Care For Chronic Disease Management and Care Coordination Needs To Assist With Meeting Treatment Goals   Start Date: 04/15/2021   Expected End Date: 08/13/2021  Priority: High  Note:   Current Barriers:  Care Coordination needs related to Financial constraints related to unable to pay rent  Chronic Disease Management support and education needs related to prediabetes, obesity, chronic pain  RNCM Clinical Goal(s):  Patient will verbalize basic understanding of prediabetes, obesity and chronic pain disease process and self health management plan .  take all medications exactly as prescribed and will call provider for medication related questions attend all scheduled medical appointments: with primary care and specialist demonstrate ongoing adherence to prescribed treatment plan for prediabetes, obesity and chronic pain as evidenced by exercise 5-7 days/week adherence to prescribed medication regimen contacting provider for new or worsened symptoms or questions . continue to work with RN Care Manager to address care management and care coordination needs related to prediabetes, obesity and chronic pain  work with Education officer, museum to address Financial constraints related to inability to pay rent and household bills related to the management of prediabetes, obesity and chronic pain  through collaboration with Consulting civil engineer, provider, and care team.   Interventions: Inter-disciplinary care team collaboration (see longitudinal plan of care) Evaluation of current treatment plan related to  self management and patient's adherence to plan as established by provider Reminded patient of appointment with urogynecologist  on 06/10/21 at 11:20 am and ensured she has transportation   Interdisciplinary Collaboration:  (Status: Goal on track: NO.)- patient has not spoken with BSW.  Collaborated with BSW to initiate plan of care to address needs related to Financial constraints related to paying rent and household bills  in patient  with  prediabetes, obesity and chronic pain Inter-disciplinary care team collaboration (see longitudinal  plan of care) 05/13/21- Advised patient of 3 unsuccessful outreaches to schedule BSW call. After determining convenient time with patient,; emphasized importance of answering scheduling care guide's call today between 2-2:30 pm. BSW provided patient with resources for DSS [phone], [employer], [employer] and Member number for [employer]. No other resources needed at this time.   Weight Loss:  (Status: Goal on track: NO.) Provided patient with contact information for managed Medicaid health plan customer service phone number about weight management benefits  05/13/21- Reviewed weight management benefits offered by patient's health plan and again encouraged her to call customer service for details  PreDiabetes:  (Status: Goal on track: NO.) Lab Results  Component Value Date   HGBA1C 5.7 (H) 12/06/2019  Assessed patient's understanding of A1c goal:  <5.7% Provided education to patient about basic prediabetes disease process; Provided written information on prediabetes eating plan Counseled on importance of regular laboratory monitoring as prescribed;        Discussed plans with patient for ongoing care management follow up and provided patient with direct contact information for care management team;      05/13/21- Encouraged patient to review educational materials related to prediabetes mailed to her last month  Pain:  (Status: New goal. Goal on track: YES.) Pain assessment performed Medications reviewed Reviewed provider established plan for pain management; Discussed importance of adherence to all scheduled medical appointments; Counseled on the importance of reporting any/all new or changed pain symptoms or management strategies to pain management provider; Reviewed with patient prescribed pharmacological and nonpharmacological pain relief strategies; Provided written health plan information related to alternative healing benefits Hannah Fleming as  massage and  acupuncture 05/13/21- Encouraged patient to review chronic pain education mailed to her last month  Patient Goals/Self-Care Activities: Patient will self administer medications as prescribed Patient will schedule phone call with BSW when scheduling care guide calls on 05/13/21  Patient will attend all scheduled provider appointments  Patient will review educational material mailed to her on 04/15/21 Patient will continue to perform ADL's independently Patient will continue to perform IADL's independently Patient will call provider office for new concerns or questions Patient will work with BSW to address care coordination needs and will continue to work with the clinical team to address health care and disease management related needs.

## 2021-05-13 NOTE — Patient Outreach (Signed)
Medicaid Managed Care   Nurse Care Manager Note  05/13/2021 Name:  Hannah Fleming MRN:  [id number] DOB:  11/11/1980  Hannah Fleming is an 40 y.o. year old female who is a primary patient of Juleen China, Bayard Beaver, MD.  The [facility] Managed Care Coordination team was consulted for assistance with:    Chronic pain, obesity and prediabetes  Ms. Winningham was given information about Medicaid Managed Care Coordination team services today. [name] Patient agreed to services and verbal consent obtained.  Engaged with patient by telephone for follow up visit in response to provider referral for case management and/or care coordination services.   Assessments/Interventions:  Review of past medical history, allergies, medications, health status, including review of consultants reports, laboratory and other test data, was performed as part of comprehensive evaluation and provision of chronic care management services.  SDOH (Social Determinants of Health) assessments and interventions performed:   Care Plan  Allergies  Allergen Reactions   Prilosec Otc [Omeprazole Magnesium] Hives   Latex Itching and Rash    Medications Reviewed Today     Reviewed by Barrington Ellison, RN (Registered Nurse) on 04/15/21 at 45  Med List Status: <None>   Medication Order Taking? Sig Documenting Provider Last Dose Status Informant  Acetaminophen (TYLENOL PO) [id number] Yes Take by mouth as needed. [provider] Taking Active   cetirizine (ZYRTEC) 10 MG tablet [id number] Yes Take 10 mg by mouth daily. [provider] Taking Active Self  Cetirizine HCl 10 MG CAPS [id number]  Take 1 capsule (10 mg total) by mouth daily for 10 days. Wieters, Hallie C, PA-C  Expired 01/29/21 2359   CRANBERRY PO [id number] Yes Take 1 tablet by mouth daily. [provider] Taking Active   IBUPROFEN PO [id number] Yes Take 800 mg by mouth as needed. [provider] Taking Active Self   Multiple Vitamins-Minerals (MULTIVITAMIN ADULT) TABS [id number] Yes Take 1 tablet by mouth daily. [provider] Taking Active Self            Patient Active Problem List   Diagnosis Date Noted   Bacterial vaginitis 11/27/2020   Candida vaginitis 11/27/2020   Prediabetes 06/21/2019   Unspecified disorder of menstruation and other abnormal bleeding from female genital tract 12/28/2013   Anxiety 02/07/2013   Dysmenorrhea 02/07/2013    Conditions to be addressed/monitored per PCP order:  Chronic pain, obesity and prediabetes  Care Plan : RN Care Manager Plan Of Care  Updates made by Barrington Ellison, RN since 05/13/2021 12:00 AM     Problem: Chronic Disease Management and Care Coordination Needs      Long-Range Goal: Development of Plan Of Care For Chronic Disease Management and Care Coordination Needs To Assist With Meeting Treatment Goals   Start Date: 04/15/2021  Expected End Date: 08/13/2021  Priority: High  Note:   Current Barriers:  Care Coordination needs related to Financial constraints related to unable to pay rent  Chronic Disease Management support and education needs related to prediabetes, obesity, chronic pain  RNCM Clinical Goal(s):  Patient will verbalize basic understanding of prediabetes, obesity and chronic pain disease process and self health management plan .  take all medications exactly as prescribed and will call provider for medication related questions attend all scheduled medical appointments: with primary care and specialist demonstrate ongoing adherence to prescribed treatment plan for prediabetes, obesity and chronic pain as evidenced by exercise 5-7 days/week adherence to prescribed medication regimen contacting provider for new or worsened symptoms  or questions . continue to work with RN Care Manager to address care management and care coordination needs related to prediabetes, obesity and chronic pain  work with Education officer, museum to address  Financial constraints related to inability to pay rent and household bills related to the management of prediabetes, obesity and chronic pain  through collaboration with Consulting civil engineer, provider, and care team.   Interventions: Inter-disciplinary care team collaboration (see longitudinal plan of care) Evaluation of current treatment plan related to  self management and patient's adherence to plan as established by provider Reminded patient of appointment with urogynecologist  on 06/10/21 at 11:20 am and ensured she has transportation    Interdisciplinary Collaboration:  (Status: Goal on track: NO.)- patient has not spoken with BSW.  Collaborated with BSW to initiate plan of care to address needs related to Financial constraints related to paying rent and household bills  in patient with  prediabetes, obesity and chronic pain Inter-disciplinary care team collaboration (see longitudinal plan of care) 05/13/21- Advised patient of 3 unsuccessful outreaches to schedule BSW call. After determining convenient time with patient,; emphasized importance of answering scheduling care guide's call today between 2-2:30 pm.   Weight Loss:  (Status: Goal on track: NO.) Provided patient with contact information for managed Medicaid health plan customer service phone number about weight management benefits  05/13/21- Reviewed weight management benefits offered by patient's health plan and again encouraged her to call customer service for details  PreDiabetes:  (Status: Goal on track: NO.) Lab Results  Component Value Date   HGBA1C 5.7 (H) 12/06/2019  Assessed patient's understanding of A1c goal:  <5.7% Provided education to patient about basic prediabetes disease process; Provided written information on prediabetes eating plan Counseled on importance of regular laboratory monitoring as prescribed;        Discussed plans with patient for ongoing care management follow up and provided patient with direct  contact information for care management team;      05/13/21- Encouraged patient to review educational materials related to prediabetes mailed to her last month  Pain:  (Status: New goal. Goal on track: YES.) Pain assessment performed Medications reviewed Reviewed provider established plan for pain management; Discussed importance of adherence to all scheduled medical appointments; Counseled on the importance of reporting any/all new or changed pain symptoms or management strategies to pain management provider; Reviewed with patient prescribed pharmacological and nonpharmacological pain relief strategies; Provided written health plan information related to alternative healing benefits such as  massage and acupuncture 05/13/21- Encouraged patient to review chronic pain education mailed to her last month  Patient Goals/Self-Care Activities: Patient will self administer medications as prescribed Patient will schedule phone call with BSW when scheduling care guide calls on 05/13/21  Patient will attend all scheduled provider appointments Patient will review educational material mailed to her on 04/15/21 Patient will continue to perform ADL's independently Patient will continue to perform IADL's independently Patient will call provider office for new concerns or questions Patient will work with BSW to address care coordination needs and will continue to work with the clinical team to address health care and disease management related needs.         Follow Up:  Patient agrees to Care Plan and Follow-up.  Plan: The Managed Medicaid care management team will reach out to the patient again over the next 30 days.  Date/time of next scheduled RN care management/care coordination outreach:  06/10/21 at 3:30 pm  Kelli Churn RN, CCM, [place]  Curator - Managed Medicaid High Risk [phone]

## 2021-05-13 NOTE — Patient Outreach (Signed)
Medicaid Managed Care Social Work Note  05/13/2021 Name:  Hannah Fleming MRN:  [id number] DOB:  01/26/1981  Hannah Fleming is an 40 y.o. year old female who is a primary patient of Hannah Fleming, Hannah Beaver, MD.  The Medicaid Managed Care Coordination team was consulted for assistance with:  Community Resources   Ms. Rundle was given information about Medicaid Managed Care Coordination team services today. Hannah Fleming Patient agreed to services and verbal consent obtained.  Engaged with patient  for by telephone forinitial visit in response to referral for case management and/or care coordination services.   Assessments/Interventions:  Review of past medical history, allergies, medications, health status, including review of consultants reports, laboratory and other test data, was performed as part of comprehensive evaluation and provision of chronic care management services.  SDOH: (Social Determinant of Health) assessments and interventions performed:  BSW provided patient with resources for DSS [phone], [employer], [employer] and Member number for [employer]. No other resources needed at this time.   Advanced Directives Status:  Not addressed in this encounter.  Care Plan                 Allergies  Allergen Reactions   Prilosec Otc [Omeprazole Magnesium] Hives   Latex Itching and Rash    Medications Reviewed Today     Reviewed by Barrington Ellison, RN (Registered Nurse) on 05/13/21 at 1343  Med List Status: <None>   Medication Order Taking? Sig Documenting Provider Last Dose Status Informant  Acetaminophen (TYLENOL PO) [id number] No Take by mouth as needed. [provider] Taking Active   cetirizine (ZYRTEC) 10 MG tablet [id number] No Take 10 mg by mouth daily. [provider] Taking Active Self  Cetirizine HCl 10 MG CAPS [id number]  Take 1 capsule (10 mg total) by mouth daily for 10 days. Wieters, Hallie C, PA-C   Expired 01/29/21 2359   CRANBERRY PO [id number] No Take 1 tablet by mouth daily. [provider] Taking Active   IBUPROFEN PO [id number] No Take 800 mg by mouth as needed. [provider] Taking Active Self  Multiple Vitamins-Minerals (MULTIVITAMIN ADULT) TABS [id number] No Take 1 tablet by mouth daily. [provider] Taking Active Self            Patient Active Problem List   Diagnosis Date Noted   Bacterial vaginitis 11/27/2020   Candida vaginitis 11/27/2020   Prediabetes 06/21/2019   Unspecified disorder of menstruation and other abnormal bleeding from female genital tract 12/28/2013   Anxiety 02/07/2013   Dysmenorrhea 02/07/2013    Conditions to be addressed/monitored per PCP order:   assistance with rent and utilites.  Care Plan : RN Care Manager Plan Of Care  Updates made by Ethelda Chick since 05/13/2021 12:00 AM     Problem: Chronic Disease Management and Care Coordination Needs      Long-Range Goal: Development of Plan Of Care For Chronic Disease Management and Care Coordination Needs To Assist With Meeting Treatment Goals   Start Date: 04/15/2021  Expected End Date: 08/13/2021  Priority: High  Note:   Current Barriers:  Care Coordination needs related to Financial constraints related to unable to pay rent  Chronic Disease Management support and education needs related to prediabetes, obesity, chronic pain  RNCM Clinical Goal(s):  Patient will verbalize basic understanding of prediabetes, obesity and chronic pain disease process and self health management plan .  take all medications exactly as prescribed and will call provider  for medication related questions attend all scheduled medical appointments: with primary care and specialist demonstrate ongoing adherence to prescribed treatment plan for prediabetes, obesity and chronic pain as evidenced by exercise 5-7 days/week adherence to prescribed medication regimen contacting provider for  new or worsened symptoms or questions . continue to work with RN Care Manager to address care management and care coordination needs related to prediabetes, obesity and chronic pain  work with Education officer, museum to address Financial constraints related to inability to pay rent and household bills related to the management of prediabetes, obesity and chronic pain  through collaboration with Consulting civil engineer, provider, and care team.   Interventions: Inter-disciplinary care team collaboration (see longitudinal plan of care) Evaluation of current treatment plan related to  self management and patient's adherence to plan as established by provider Reminded patient of appointment with urogynecologist  on 06/10/21 at 11:20 am and ensured she has transportation   Interdisciplinary Collaboration:  (Status: Goal on track: NO.)- patient has not spoken with BSW.  Collaborated with BSW to initiate plan of care to address needs related to Financial constraints related to paying rent and household bills  in patient with  prediabetes, obesity and chronic pain Inter-disciplinary care team collaboration (see longitudinal plan of care) 05/13/21- Advised patient of 3 unsuccessful outreaches to schedule BSW call. After determining convenient time with patient,; emphasized importance of answering scheduling care guide's call today between 2-2:30 pm. BSW provided patient with resources for DSS [phone], [employer], [employer] and Member number for [employer]. No other resources needed at this time.   Weight Loss:  (Status: Goal on track: NO.) Provided patient with contact information for managed Medicaid health plan customer service phone number about weight management benefits  05/13/21- Reviewed weight management benefits offered by patient's health plan and again encouraged her to call customer service for details  PreDiabetes:  (Status: Goal on track: NO.) Lab Results  Component  Value Date   HGBA1C 5.7 (H) 12/06/2019  Assessed patient's understanding of A1c goal:  <5.7% Provided education to patient about basic prediabetes disease process; Provided written information on prediabetes eating plan Counseled on importance of regular laboratory monitoring as prescribed;        Discussed plans with patient for ongoing care management follow up and provided patient with direct contact information for care management team;      05/13/21- Encouraged patient to review educational materials related to prediabetes mailed to her last month  Pain:  (Status: New goal. Goal on track: YES.) Pain assessment performed Medications reviewed Reviewed provider established plan for pain management; Discussed importance of adherence to all scheduled medical appointments; Counseled on the importance of reporting any/all new or changed pain symptoms or management strategies to pain management provider; Reviewed with patient prescribed pharmacological and nonpharmacological pain relief strategies; Provided written health plan information related to alternative healing benefits such as  massage and acupuncture 05/13/21- Encouraged patient to review chronic pain education mailed to her last month  Patient Goals/Self-Care Activities: Patient will self administer medications as prescribed Patient will schedule phone call with BSW when scheduling care guide calls on 05/13/21  Patient will attend all scheduled provider appointments  Patient will review educational material mailed to her on 04/15/21 Patient will continue to perform ADL's independently Patient will continue to perform IADL's independently Patient will call provider office for new concerns or questions Patient will work with BSW to address care coordination needs and will continue to work with the clinical team  to address health care and disease management related needs.         Follow up:  Patient agrees to Care Plan and  Follow-up.  Plan: The Managed Medicaid care management team will reach out to the patient again over the next 30 days.  Date/time of next scheduled Social Work care management/care coordination outreach:  06/12/21  Mickel Fuchs, Arita Miss, [place] Managed Medicaid Team  [phone]

## 2021-05-13 NOTE — Patient Instructions (Signed)
Visit Information  [name] was given information about Medicaid Managed Care team care coordination services as a part of their [place] Medicaid benefit. Hannah Fleming verbally consented to engagement with the [facility] Managed Care team.   If you are experiencing a medical emergency, please call 911 or report to your local emergency department or urgent care.   If you have a non-emergency medical problem during routine business hours, please contact your provider's office and ask to speak with a nurse.   For questions related to your [facility], please call: [phone] or visit the homepage here: [email or web address]  If you would like to schedule transportation through your [facility], please call the following number at least 2 days in advance of your appointment: [phone].   Call the [place] at [phone], at any time, 24 hours a day, 7 days a week. If you are in danger or need immediate medical attention call 911.  If you would like help to quit smoking, call 1-800-QUIT-NOW 3657557266) OR Espaol: 1-855-Djelo-Ya (6-237-628-3151) o para ms informacin haga clic aqu or Text READY to 200-400 to register via text  [name] - following are the goals we discussed in your visit today:   Goals Addressed             This Visit's Progress    Cope with Chronic Pain       Timeframe:  Long-Range Goal Priority:  Medium Start Date:     04/15/21                        Expected End Date:    ongoing                   Follow Up Date 06/10/21    - learn relaxation techniques - practice acceptance of chronic pain - practice relaxation or meditation daily - spend time with positive people - think of new ways to do favorite things - use distraction techniques - use relaxation during pain  - review written information mailed  to me on chronic pain   Why is this important?   Stress makes chronic pain feel worse.  Feelings like depression, anxiety, stress and anger can make your body more sensitive to pain.  Learning ways to cope with stress or depression may help you find some relief from the pain.     Notes: 05/13/21- patient reports pain is better, not requiring OTC medications at this time. Says she received but has not read the chronic pain educational information that was mailed to her last month.      Find Help in My Community       Timeframe:  Short-Term Goal Priority:  High Start Date:       04/15/21                      Expected End Date:    ongoing                   Follow Up Date 06/10/21    - follow-up on any referrals for help I am given    Why is this important?   Knowing how and where to find help for yourself or family in your neighborhood and community is an important skill.  You will want to take some steps to learn how.    Notes: 05/13/21- advised patient of 3  unsuccessful outreaches to schedule BSW call. After determining convenient time with patient,; emphasized importance of answering scheduling care guide's call today between 2-2:30 pm.      Learn More About My Health       Timeframe:  Long-Range Goal Priority:  High Start Date:   04/15/21                          Expected End Date:     ongoing                   Follow Up Date 06/10/21    - make a list of questions - ask questions - review educational material mailed to me  on prediabetes and chronic pain - call my health plan customer service number to secure replacement card and to find out about benefits related to weight management, phone program and earning money via completing wellness activities  - review information mailed to me about health plan benefits   Why is this important?   The best way to learn about your health and care is by talking to the doctor and nurse.  They will answer your questions and give you  information in the way that you like best.    Notes: 05/13/21 patient verified she received educational information, health plan benefit summary  and day planner/calendar that was mailed to her last month. States she has not had time to call health plan's customer service about benefits nor has she reviewed educational information and health plan benefits summary mailed to her last month        The patient verbalized understanding of instructions provided today and declined a print copy of patient instruction materials.   Telephone follow up appointment with Managed Medicaid care management team member scheduled for: 06/10/21 at 3:30 pm  Kelli Churn RN, CCM, [place] Management Coordinator - Managed [place] High Risk [phone]   Following is a copy of your plan of care:  Patient Care Plan: RN Care Manager Plan Of Care     Problem Identified: Chronic Disease Management and Care Coordination Needs      Long-Range Goal: Development of Plan Of Care For Chronic Disease Management and Care Coordination Needs To Assist With Meeting Treatment Goals   Start Date: 04/15/2021  Expected End Date: 08/13/2021  Priority: High  Note:   Current Barriers:  Care Coordination needs related to Financial constraints related to unable to pay rent  Chronic Disease Management support and education needs related to prediabetes, obesity, chronic pain  RNCM Clinical Goal(s):  Patient will verbalize basic understanding of prediabetes, obesity and chronic pain disease process and self health management plan .  take all medications exactly as prescribed and will call provider for medication related questions attend all scheduled medical appointments: with primary care and specialist demonstrate ongoing adherence to prescribed treatment plan for prediabetes, obesity and chronic pain as evidenced by exercise 5-7 days/week adherence to prescribed medication regimen  contacting provider for new or worsened symptoms or questions . continue to work with RN Care Manager to address care management and care coordination needs related to prediabetes, obesity and chronic pain  work with Education officer, museum to address Financial constraints related to inability to pay rent and household bills related to the management of prediabetes, obesity and chronic pain  through collaboration with Consulting civil engineer, provider, and care team.   Interventions: Inter-disciplinary care team collaboration (see longitudinal plan  of care) Evaluation of current treatment plan related to  self management and patient's adherence to plan as established by provider Reminded patient of appointment with urogynecologist  on 06/10/21 at 11:20 am and ensured she has transportation   Interdisciplinary Collaboration:  (Status: Goal on track: NO.)- patient has not spoken with BSW.  Collaborated with BSW to initiate plan of care to address needs related to Financial constraints related to paying rent and household bills  in patient with  prediabetes, obesity and chronic pain Inter-disciplinary care team collaboration (see longitudinal plan of care) 05/13/21- Advised patient of 3 unsuccessful outreaches to schedule BSW call. After determining convenient time with patient,; emphasized importance of answering scheduling care guide's call today between 2-2:30 pm.   Weight Loss:  (Status: Goal on track: NO.) Provided patient with contact information for managed Medicaid health plan customer service phone number about weight management benefits  05/13/21- Reviewed weight management benefits offered by patient's health plan and again encouraged her to call customer service for details  PreDiabetes:  (Status: Goal on track: NO.) Lab Results  Component Value Date   HGBA1C 5.7 (H) 12/06/2019  Assessed patient's understanding of A1c goal:  <5.7% Provided education to patient about basic prediabetes disease  process; Provided written information on prediabetes eating plan Counseled on importance of regular laboratory monitoring as prescribed;        Discussed plans with patient for ongoing care management follow up and provided patient with direct contact information for care management team;      05/13/21- Encouraged patient to review educational materials related to prediabetes mailed to her last month  Pain:  (Status: New goal. Goal on track: YES.) Pain assessment performed Medications reviewed Reviewed provider established plan for pain management; Discussed importance of adherence to all scheduled medical appointments; Counseled on the importance of reporting any/all new or changed pain symptoms or management strategies to pain management provider; Reviewed with patient prescribed pharmacological and nonpharmacological pain relief strategies; Provided written health plan information related to alternative healing benefits such as  massage and acupuncture 05/13/21- Encouraged patient to review chronic pain education mailed to her last month  Patient Goals/Self-Care Activities: Patient will self administer medications as prescribed Patient will schedule phone call with BSW when scheduling care guide calls on 05/13/21  Patient will attend all scheduled provider appointments  Patient will review educational material mailed to her on 04/15/21 Patient will continue to perform ADL's independently Patient will continue to perform IADL's independently Patient will call provider office for new concerns or questions Patient will work with BSW to address care coordination needs and will continue to work with the clinical team to address health care and disease management related needs.

## 2021-06-04 ENCOUNTER — Other Ambulatory Visit: Payer: Self-pay | Admitting: Internal Medicine

## 2021-06-04 DIAGNOSIS — N946 Dysmenorrhea, unspecified: Secondary | ICD-10-CM

## 2021-06-09 ENCOUNTER — Encounter: Payer: Self-pay | Admitting: Family Medicine

## 2021-06-09 ENCOUNTER — Ambulatory Visit (INDEPENDENT_AMBULATORY_CARE_PROVIDER_SITE_OTHER): Payer: Medicaid Other | Admitting: Family Medicine

## 2021-06-09 ENCOUNTER — Other Ambulatory Visit: Payer: Self-pay

## 2021-06-09 VITALS — BP 121/86 | HR 75 | Temp 98.0°F | Resp 16 | Wt 192.4 lb

## 2021-06-09 DIAGNOSIS — N920 Excessive and frequent menstruation with regular cycle: Secondary | ICD-10-CM | POA: Diagnosis not present

## 2021-06-09 DIAGNOSIS — N946 Dysmenorrhea, unspecified: Secondary | ICD-10-CM

## 2021-06-09 MED ORDER — TRAMADOL HCL 50 MG PO TABS: 50.0000 mg | ORAL_TABLET | Freq: Three times a day (TID) | ORAL | 0 refills | Status: AC | PRN

## 2021-06-09 MED ORDER — NORGESTIMATE-ETH ESTRADIOL 0.25-35 MG-MCG PO TABS: 1.0000 | ORAL_TABLET | Freq: Every day | ORAL | 2 refills | Status: DC

## 2021-06-09 NOTE — Progress Notes (Deleted)
[facility] Return Visit  SUBJECTIVE  History of Present Illness: Hannah Fleming is a 40 y.o. female seen in follow-up for fecal incontinence. Plan at last visit was to start psylllium fiber supplementation.     Past Medical History: Patient  has a past medical history of Allergy, Genital herpes, Infection, No pertinent past medical history, Trichomonas infection, and Urticaria.   Past Surgical History: She  has a past surgical history that includes Dilation and curettage of uterus; Induced abortion; and Wisdom tooth extraction.   Medications: She has a current medication list which includes the following prescription(s): acetaminophen, cetirizine, cranberry, ibuprofen, multivitamin adult, norgestimate-ethinyl estradiol, and tramadol.   Allergies: Patient is allergic to prilosec otc [omeprazole magnesium] and latex.   Social History: Patient  reports that she quit smoking about 12 years ago. Her smoking use included cigarettes. She has never used smokeless tobacco. She reports current alcohol use. She reports that she does not use drugs.      OBJECTIVE     Physical Exam: There were no vitals filed for this visit. Gen: No apparent distress, A&O x 3.  Detailed Urogynecologic Evaluation:  Deferred. Prior exam showed:  No flowsheet data found.     ASSESSMENT AND PLAN    Hannah Fleming is a 40 y.o. with:  No diagnosis found.

## 2021-06-09 NOTE — Progress Notes (Signed)
Patient is here for a refill of medication. Patient was a previous patient of Dr.Harper/ Jule[facility] China that had given  her a rx for her cycle. Patient is asking for a refill

## 2021-06-10 ENCOUNTER — Ambulatory Visit: Payer: Self-pay

## 2021-06-10 ENCOUNTER — Ambulatory Visit: Payer: Medicaid Other | Admitting: Obstetrics and Gynecology

## 2021-06-10 ENCOUNTER — Other Ambulatory Visit: Payer: Self-pay | Admitting: *Deleted

## 2021-06-10 ENCOUNTER — Ambulatory Visit: Payer: Medicaid Other

## 2021-06-10 ENCOUNTER — Encounter: Payer: Self-pay | Admitting: Family Medicine

## 2021-06-10 NOTE — Progress Notes (Signed)
Established  Patient Office Visit  Subjective:  Patient ID: Hannah Fleming, female    DOB: [birth year]  Age: 40 y.o. MRN: [id number]  CC:  Chief Complaint  Patient presents with   Medication Refill    HPI Hannah Fleming presents for complaint of excessive bleeding with menses as well as severe pain. She reports that she had been on various birth control including depo and implanon in the past. She is wanting to have another child within the next 3 years so she has not been interested in a hysterectomy that was offered as an option. Patient reports that she has changed her eating and activity habits in order to help with symptoms with minimal to no results.   Past Medical History:  Diagnosis Date   Allergy    Phreesia 02/29/2020   Genital herpes    no outbreak in 58yrs   Infection    UTI   No pertinent past medical history    Trichomonas infection    Urticaria     Past Surgical History:  Procedure Laterality Date   DILATION AND CURETTAGE OF UTERUS     INDUCED ABORTION     WISDOM TOOTH EXTRACTION      Family History  Problem Relation Age of Onset   Healthy Mother    Sickle cell trait Mother    Healthy Father    Sickle cell trait Sister    Sickle cell trait Brother    Healthy Daughter    Healthy Son    Healthy Son    Other Neg Hx     Social History   Socioeconomic History   Marital status: Single    Spouse name: Not on file   Number of children: 3   Years of education: Not on file   Highest education level: Not on file  Occupational History   Not on file  Tobacco Use   Smoking status: Former    Types: Cigarettes    Quit date: 02/07/2009    Years since quitting: 12.3   Smokeless tobacco: Never  Vaping Use   Vaping Use: Never used  Substance and Sexual Activity   Alcohol use: Yes    Alcohol/week: 0.0 standard drinks    Comment: occasional   Drug use: No   Sexual activity: Yes    Partners: Male    Birth control/protection: None  Other Topics  Concern   Not on file  Social History Narrative   Not on file   Social Determinants of Health   Financial Resource Strain: High Risk   Difficulty of Paying Living Expenses: Hard  Food Insecurity: Not on file  Transportation Needs: No Transportation Needs   Lack of Transportation (Medical): No   Lack of Transportation (Non-Medical): No  Physical Activity: Sufficiently Active   Days of Exercise per Week: 7 days   Minutes of Exercise per Session: 30 min  Stress: Not on file  Social Connections: Not on file  Intimate Partner Violence: Not on file    ROS Review of Systems  Genitourinary:  Positive for menstrual problem. Negative for vaginal discharge.  All other systems reviewed and are negative.  Objective:   Today's Vitals: BP 121/86   Pulse 75   Temp 98 F (36.7 C) (Oral)   Resp 16   Wt 192 lb 6.4 oz (87.3 kg)   SpO2 97%   BMI 33.03 kg/m   Physical Exam Vitals and nursing note reviewed.  Constitutional:      General: She is  not in acute distress. Cardiovascular:     Rate and Rhythm: Normal rate and regular rhythm.  Pulmonary:     Effort: Pulmonary effort is normal.     Breath sounds: Normal breath sounds.  Abdominal:     Palpations: Abdomen is soft.     Tenderness: There is no abdominal tenderness.  Neurological:     General: No focal deficit present.     Mental Status: She is alert and oriented to person, place, and time.    Assessment & Plan:   1. Dysmenorrhea Patient prescribed ortho cyclen as well as tramadol. To utilized NSAIDS prn. Patient referred to gyn for further eval/mgt - Ambulatory referral to Gynecology  2. Menorrhagia with regular cycle As above - Ambulatory referral to Gynecology    Outpatient Encounter Medications as of 06/09/2021  Medication Sig   Acetaminophen (TYLENOL PO) Take by mouth as needed.   cetirizine (ZYRTEC) 10 MG tablet Take 10 mg by mouth daily.   CRANBERRY PO Take 1 tablet by mouth daily.   IBUPROFEN PO Take 800 mg  by mouth as needed.   Multiple Vitamins-Minerals (MULTIVITAMIN ADULT) TABS Take 1 tablet by mouth daily.   norgestimate-ethinyl estradiol (ORTHO-CYCLEN, 28,) 0.25-35 MG-MCG tablet Take 1 tablet by mouth daily.   traMADol (ULTRAM) 50 MG tablet Take 1 tablet (50 mg total) by mouth every 8 (eight) hours as needed for up to 5 days.   [DISCONTINUED] Cetirizine HCl 10 MG CAPS Take 1 capsule (10 mg total) by mouth daily for 10 days.   No facility-administered encounter medications on file as of 06/09/2021.    Follow-up: No follow-ups on file.   Hannah Sax, MD

## 2021-06-10 NOTE — Patient Instructions (Signed)
Hannah Fleming ,   [facility] Managed Care Team is available to provide assistance to you with your healthcare needs at no cost and as a benefit of your [facility] plan. I'm sorry I was unable to reach you today for our scheduled appointment. Our care guide will call you to reschedule our telephone appointment. Please call me at the number below. I am available to be of assistance to you regarding your healthcare needs. .   Thank you,   Kelli Churn RN, CCM, [facility] Care Management Coordinator - Managed [place] High Risk [phone]

## 2021-06-10 NOTE — Patient Outreach (Signed)
Care Coordination  06/10/2021  Hannah Fleming 04/22/1981 [id number]  06/10/2021 Name: Hannah Fleming MRN: [id number] DOB: 1981/06/08  Referred by: [name], MD Reason for referral : High Risk Managed Medicaid (Unsuccessful RNCM follow up call)   An unsuccessful telephone outreach was attempted today. The patient was referred to the case management team for assistance with care management and care coordination.  Unable to leave message as recording states voice mailbox is full.   Follow Up Plan: The Managed Medicaid care management team will reach out to the patient again over the next 7 days.    Kelli Churn RN, CCM, [facility] Care Management Coordinator - Managed [place] High Risk [phone]

## 2021-06-12 ENCOUNTER — Other Ambulatory Visit: Payer: Self-pay

## 2021-06-12 NOTE — Patient Outreach (Signed)
Care Coordination  06/12/2021  BRADI ARBUTHNOT [birth year] [id number]   Medicaid Managed Care   Unsuccessful Outreach Note  06/12/2021 Name: Hannah Fleming MRN: [id number] DOB: 1981/02/15  Referred by: [name], MD Reason for referral : High Risk Managed Medicaid (MM Unsuccessful Telephone Outreach)   An unsuccessful telephone outreach was attempted today. The patient was referred to the case management team for assistance with care management and care coordination.   Follow Up Plan: The care management team will reach out to the patient again over the next 30 days.   Mickel Fuchs, BSW, [place] Managed Medicaid Team  [phone]

## 2021-06-12 NOTE — Patient Instructions (Signed)
Visit Information  Ms. [name]  - as a part of your Medicaid benefit, you are eligible for care management and care coordination services at no cost or copay. I was unable to reach you by phone today but would be happy to help you with your health related needs. Please feel free to call me @ [phone]   A member of the Managed Medicaid care management team will reach out to you again over the next 30 days.   Mickel Fuchs, BSW, [place] Managed Medicaid Team  [phone]

## 2021-06-18 ENCOUNTER — Ambulatory Visit: Payer: Medicaid Other | Admitting: Obstetrics and Gynecology

## 2021-06-20 NOTE — Progress Notes (Deleted)
[facility] Return Visit  SUBJECTIVE  History of Present Illness: NALEA SALCE is a 40 y.o. female seen in follow-up for fecal incontinence. Plan at last visit was to start psyllium fiber supplementation.     Past Medical History: Patient  has a past medical history of Allergy, Genital herpes, Infection, No pertinent past medical history, Trichomonas infection, and Urticaria.   Past Surgical History: She  has a past surgical history that includes Dilation and curettage of uterus; Induced abortion; and Wisdom tooth extraction.   Medications: She has a current medication list which includes the following prescription(s): acetaminophen, cetirizine, cranberry, ibuprofen, multivitamin adult, and norgestimate-ethinyl estradiol.   Allergies: Patient is allergic to prilosec otc [omeprazole magnesium] and latex.   Social History: Patient  reports that she quit smoking about 12 years ago. Her smoking use included cigarettes. She has never used smokeless tobacco. She reports current alcohol use. She reports that she does not use drugs.      OBJECTIVE     Physical Exam: There were no vitals filed for this visit. Gen: No apparent distress, A&O x 3.  Detailed Urogynecologic Evaluation:  Deferred. Prior exam showed:  No flowsheet data found.     ASSESSMENT AND PLAN    Ms. Fiumara is a 40 y.o. with:  No diagnosis found.

## 2021-06-23 ENCOUNTER — Ambulatory Visit: Payer: Medicaid Other | Admitting: Obstetrics and Gynecology

## 2021-07-21 NOTE — Progress Notes (Deleted)
[facility] Return Visit  SUBJECTIVE  History of Present Illness: [name] is a 40 y.o. female seen in follow-up for accidental bowel leakage. Plan at last visit was to add psyllium fiber supplemetation.     Past Medical History: Patient  has a past medical history of Allergy, Genital herpes, Infection, No pertinent past medical history, Trichomonas infection, and Urticaria.   Past Surgical History: She  has a past surgical history that includes Dilation and curettage of uterus; Induced abortion; and Wisdom tooth extraction.   Medications: She has a current medication list which includes the following prescription(s): acetaminophen, cetirizine, cranberry, ibuprofen, multivitamin adult, and norgestimate-ethinyl estradiol.   Allergies: Patient is allergic to prilosec otc [omeprazole magnesium] and latex.   Social History: Patient  reports that she quit smoking about 12 years ago. Her smoking use included cigarettes. She has never used smokeless tobacco. She reports current alcohol use. She reports that she does not use drugs.      OBJECTIVE     Physical Exam: There were no vitals filed for this visit. Gen: No apparent distress, A&O x 3.  Detailed Urogynecologic Evaluation:  Deferred. Prior exam showed:  No flowsheet data found.     ASSESSMENT AND PLAN    Hannah Fleming is a 40 y.o. with:  No diagnosis found.

## 2021-07-22 ENCOUNTER — Ambulatory Visit: Payer: Medicaid Other | Admitting: Obstetrics and Gynecology

## 2021-07-30 ENCOUNTER — Ambulatory Visit (INDEPENDENT_AMBULATORY_CARE_PROVIDER_SITE_OTHER): Payer: Medicaid Other | Admitting: Obstetrics and Gynecology

## 2021-07-30 ENCOUNTER — Other Ambulatory Visit: Payer: Self-pay

## 2021-07-30 VITALS — BP 123/81 | HR 74 | Temp 98.0°F | Ht 64.0 in | Wt 197.0 lb

## 2021-07-30 DIAGNOSIS — N946 Dysmenorrhea, unspecified: Secondary | ICD-10-CM | POA: Diagnosis not present

## 2021-07-30 DIAGNOSIS — N939 Abnormal uterine and vaginal bleeding, unspecified: Secondary | ICD-10-CM | POA: Diagnosis not present

## 2021-07-30 NOTE — Progress Notes (Signed)
  GYNECOLOGY PROGRESS NOTE  History:  [name] is a 40 y.o. [id number] presents to [facility] office today for problem gyn visit. She reports heavy and painful menstrual cycles. She reports they lasts 6-7 days. Her LMP was 07/25/2021. She currently at the end of her cycle. She has to take Tramadol (she was taking Percocet with Ibuprofen) for the pain and she has to be "on bedrest" for the first 1-2 days of the cycle. She reports the Tramadol makes her cycles "more tolerable," but she is still in an unusual amount of pain every month. She also reports having 2 menstrual cycles in the month of July; that was unusual. She has tried getting Depo shots, has been to several providers that "only offer birth control pills or hysterectomy." She states, "I don't want to do any of that, because in my religion (Muslim) we don't do or take anything to alter our reproductive abilities." She was Rx'd OCP by the last provider, but she did npt take them. She has seen Dr. Jodi Mourning in the past for these same issues. She has not had a pelvic u/s nor has endometrial biopsy to r/o causes. She has a h/o osteoarthritis. She denies h/a, dizziness, shortness of breath, n/v, or fever/chills.    The following portions of the patient's history were reviewed and updated as appropriate: allergies, current medications, past family history, past medical history, past social history, past surgical history and problem list.  Review of Systems:  Pertinent items are noted in HPI.   Objective:  Physical Exam Blood pressure 123/81, pulse 74, temperature 98 F (36.7 C), temperature source Oral, height 5\' 4"  (1.626 m), weight 197 lb (89.4 kg), last menstrual period 07/25/2021. VS reviewed, nursing note reviewed,  Constitutional: well developed, well nourished, no distress HEENT: normocephalic CV: normal rate Pulm/chest wall: normal effort Breast Exam: deferred Abdomen: soft Neuro: alert and oriented x 3 Skin: warm,  dry Psych: affect normal Pelvic exam: deferred  Assessment & Plan:  1. Abnormal uterine bleeding (AUB)  - Discussed the options I could offer her(IUD, OCP, Depo, Megace, Provera, or TXA) are all things she does not want to explore - Advised she would be best managed with a GYN physician - US PELVIS (TRANSABDOMINAL ONLY) - Transfer care to Dr. Elly Modena for f/u, review of u/s and development of a POC  2. Dysmenorrhea  - US PELVIS (TRANSABDOMINAL ONLY)    There was 15 minutes of face-to-face time spent discussing condition, past methods and referrals. There was 5 minutes of chart review time spent prior to this encounter. Total time spent = 20 minutes.  Laury Deep, CNM 3:35 PM

## 2021-08-05 ENCOUNTER — Encounter: Payer: Self-pay | Admitting: Obstetrics and Gynecology

## 2021-08-06 ENCOUNTER — Other Ambulatory Visit: Payer: Self-pay

## 2021-08-06 ENCOUNTER — Ambulatory Visit
Admission: RE | Admit: 2021-08-06 | Discharge: 2021-08-06 | Disposition: A | Discharge status: Home / Self Care | Payer: Medicaid Other | Source: Ambulatory Visit | Attending: Obstetrics and Gynecology | Admitting: Obstetrics and Gynecology

## 2021-08-06 DIAGNOSIS — N946 Dysmenorrhea, unspecified: Secondary | ICD-10-CM | POA: Insufficient documentation

## 2021-08-06 DIAGNOSIS — N939 Abnormal uterine and vaginal bleeding, unspecified: Secondary | ICD-10-CM | POA: Diagnosis not present

## 2021-08-13 NOTE — Progress Notes (Deleted)
Office Visit Note  Patient: Hannah Fleming             Date of Birth: 09-18-1980           MRN: [id number]             PCP: [name], MD Referring: Caryl Never* Visit Date: 08/27/2021 Occupation: @GUAROCC @  Subjective:  No chief complaint on file.   History of Present Illness: Hannah Fleming is a 40 y.o. female ***   Activities of Daily Living:  Patient reports morning stiffness for *** {minute/hour:19697}.   Patient {ACTIONS;DENIES/REPORTS:21021675::"Denies"} nocturnal pain.  Difficulty dressing/grooming: {ACTIONS;DENIES/REPORTS:21021675::"Denies"} Difficulty climbing stairs: {ACTIONS;DENIES/REPORTS:21021675::"Denies"} Difficulty getting out of chair: {ACTIONS;DENIES/REPORTS:21021675::"Denies"} Difficulty using hands for taps, buttons, cutlery, and/or writing: {ACTIONS;DENIES/REPORTS:21021675::"Denies"}  No Rheumatology ROS completed.   PMFS History:  Patient Active Problem List   Diagnosis Date Noted   Bacterial vaginitis 11/27/2020   Candida vaginitis 11/27/2020   Prediabetes 06/21/2019   Unspecified disorder of menstruation and other abnormal bleeding from female genital tract 12/28/2013   Anxiety 02/07/2013   Dysmenorrhea 02/07/2013    Past Medical History:  Diagnosis Date   Allergy    Phreesia 02/29/2020   Genital herpes    no outbreak in 72yrs   Infection    UTI   No pertinent past medical history    Trichomonas infection    Urticaria     Family History  Problem Relation Age of Onset   Healthy Mother    Sickle cell trait Mother    Healthy Father    Sickle cell trait Sister    Sickle cell trait Brother    Healthy Daughter    Healthy Son    Healthy Son    Other Neg Hx    Past Surgical History:  Procedure Laterality Date   DILATION AND CURETTAGE OF UTERUS     INDUCED ABORTION     WISDOM TOOTH EXTRACTION     Social History   Social History Narrative   Not on file   Immunization History  Administered Date(s)  Administered   Rho (D) Immune Globulin 06/03/2012     Objective: Vital Signs: There were no vitals taken for this visit.   Physical Exam   Musculoskeletal Exam: ***  CDAI Exam: CDAI Score: -- Patient Global: --; Provider Global: -- Swollen: --; Tender: -- Joint Exam 08/27/2021   No joint exam has been documented for this visit   There is currently no information documented on the homunculus. Go to the Rheumatology activity and complete the homunculus joint exam.  Investigation: No additional findings.  Imaging: US PELVIS (TRANSABDOMINAL ONLY)  Result Date: 08/06/2021 CLINICAL DATA:  Abnormal uterine bleeding, dysmenorrhea, LMP 07/25/2021. G8P3, prior D and C, induced abortion EXAM: TRANSABDOMINAL ULTRASOUND OF PELVIS TECHNIQUE: Transabdominal ultrasound examination of the pelvis was performed including evaluation of the uterus, ovaries, adnexal regions, and pelvic cul-de-sac. Was not performed. COMPARISON:  None FINDINGS: Uterus Measurements: 10.8 x 5.3 x 7.1 cm = volume: 211 mL. Retroflexed. Suboptimally evaluated due to position and inadequate bladder distention. Suspected small posterior wall submucosal leiomyoma 2.6 x 3.6 x 2.9 cm. No additional masses. Endometrium Thickness: 5 mm.  No endometrial fluid or mass Right ovary Not visualized, likely obscured by bowel Left ovary Not visualized, likely obscured by bowel Other findings:  No free pelvic fluid.  No adnexal masses. IMPRESSION: Limited exam due to inadequate bladder distention and bowel. Probable submucosal leiomyoma posterior mid uterus 3.6 cm greatest diameter. Nonvisualization of ovaries. Electronically Signed  By: Lavonia Dana M.D.   On: 08/06/2021 16:28    Recent Labs: Lab Results  Component Value Date   WBC 8.5 07/22/2020   HGB 13.0 07/22/2020   PLT 296 07/22/2020   NA 137 07/22/2020   K 4.3 07/22/2020   CL 103 07/22/2020   CO2 26 07/22/2020   GLUCOSE 89 07/22/2020   BUN 15 07/22/2020   CREATININE 0.69  07/22/2020   BILITOT 0.4 07/22/2020   ALKPHOS 60 12/06/2019   AST 10 07/22/2020   ALT 11 07/22/2020   PROT 7.4 07/22/2020   ALBUMIN 4.4 12/06/2019   CALCIUM 9.4 07/22/2020   GFRAA 128 07/22/2020    Speciality Comments: No specialty comments available.  Procedures:  No procedures performed Allergies: Prilosec otc [omeprazole magnesium] and Latex   Assessment / Plan:     Visit Diagnoses: Positive ANA (antinuclear antibody)  Primary osteoarthritis of both hands  Primary osteoarthritis of both knees  Primary osteoarthritis of both feet  Myofascial pain  Trapezius muscle spasm  Trochanteric bursitis of both hips  Arthropathy of lumbar facet joint  Other idiopathic scoliosis, lumbar region  Anxiety  Prediabetes  Dysmenorrhea  Orders: No orders of the defined types were placed in this encounter.  No orders of the defined types were placed in this encounter.   Face-to-face time spent with patient was *** minutes. Greater than 50% of time was spent in counseling and coordination of care.  Follow-Up Instructions: No follow-ups on file.   [name], PA-C  Note - This record has been created using Dragon software.  Chart creation errors have been sought, but may not always  have been located. Such creation errors do not reflect on  the standard of medical care.

## 2021-08-27 ENCOUNTER — Ambulatory Visit: Payer: Medicaid Other | Admitting: Rheumatology

## 2021-08-28 ENCOUNTER — Ambulatory Visit (INDEPENDENT_AMBULATORY_CARE_PROVIDER_SITE_OTHER): Payer: Medicaid Other | Admitting: Obstetrics and Gynecology

## 2021-08-28 ENCOUNTER — Encounter: Payer: Self-pay | Admitting: Obstetrics and Gynecology

## 2021-08-28 ENCOUNTER — Other Ambulatory Visit: Payer: Self-pay

## 2021-08-28 VITALS — BP 124/87 | HR 73 | Wt 198.0 lb

## 2021-08-28 DIAGNOSIS — N920 Excessive and frequent menstruation with regular cycle: Secondary | ICD-10-CM

## 2021-08-28 MED ORDER — IBUPROFEN 600 MG PO TABS: 600.0000 mg | ORAL_TABLET | Freq: Four times a day (QID) | ORAL | 1 refills | Status: DC | PRN

## 2021-08-28 NOTE — Progress Notes (Signed)
41 yo P3 with LMP 08/27/2020 and BMI 33 presenting today to discuss results of recent ultrasound and management of her menorrhagia and dysmenorrhea. Patient reports a long standing history of dysmenorrhea. She reports a monthly 7-day period heavy in flow with passage of clots. She states the first 2 days of her periods are associated with cramps for which she used to treat with percocet and now tramadol for the past month. Patient is sexually active and would welcome a pregnancy. She is not interested in contraception for cycle control nor any interventions that will impact her fertility  Past Medical History:  Diagnosis Date   Allergy    Phreesia 02/29/2020   Genital herpes    no outbreak in 54yrs   Infection    UTI   No pertinent past medical history    Trichomonas infection    Urticaria    .psh Family History  Problem Relation Age of Onset   Healthy Mother    Sickle cell trait Mother    Healthy Father    Sickle cell trait Sister    Sickle cell trait Brother    Healthy Daughter    Healthy Son    Healthy Son    Other Neg Hx    Social History   Tobacco Use   Smoking status: Former    Types: Cigarettes    Quit date: 02/07/2009    Years since quitting: 12.5   Smokeless tobacco: Never  Vaping Use   Vaping Use: Never used  Substance Use Topics   Alcohol use: Yes    Alcohol/week: 0.0 standard drinks    Comment: occasional   Drug use: No   ROS See pertinent in HPI. All other systems reviewed and non contributory  Blood pressure 124/87, pulse 73, weight 198 lb (89.8 kg), last menstrual period 08/27/2021. GENERAL: Well-developed, well-nourished female in no acute distress.  ABDOMEN: Soft, nontender, nondistended. No organomegaly. PELVIC: Patient declined due to cycle EXTREMITIES: No cyanosis, clubbing, or edema, 2+ distal pulses.  US PELVIS (TRANSABDOMINAL ONLY)  Result Date: 08/06/2021 CLINICAL DATA:  Abnormal uterine bleeding, dysmenorrhea, LMP 07/25/2021. G8P3, prior D  and C, induced abortion EXAM: TRANSABDOMINAL ULTRASOUND OF PELVIS TECHNIQUE: Transabdominal ultrasound examination of the pelvis was performed including evaluation of the uterus, ovaries, adnexal regions, and pelvic cul-de-sac. Was not performed. COMPARISON:  None FINDINGS: Uterus Measurements: 10.8 x 5.3 x 7.1 cm = volume: 211 mL. Retroflexed. Suboptimally evaluated due to position and inadequate bladder distention. Suspected small posterior wall submucosal leiomyoma 2.6 x 3.6 x 2.9 cm. No additional masses. Endometrium Thickness: 5 mm.  No endometrial fluid or mass Right ovary Not visualized, likely obscured by bowel Left ovary Not visualized, likely obscured by bowel Other findings:  No free pelvic fluid.  No adnexal masses. IMPRESSION: Limited exam due to inadequate bladder distention and bowel. Probable submucosal leiomyoma posterior mid uterus 3.6 cm greatest diameter. Nonvisualization of ovaries. Electronically Signed   By: Lavonia Dana M.D.   On: 08/06/2021 16:28     A/P 41 yo with menorrhagia with regular cycles and dysmenorrhea - Discussed medical management with scheduled NSAID 2-3 days before the onset of her cycle and during the first 2 days pf her cycle, prn afterwards - Discussed hysteroscopy with myosure vs Sonata. Patient is more interested in [facility] and understands that Dr. Elgie Congo will determine if she is a good candidate - Explained that although her fibroids may contribute to her heavier periods, it may or may not be contribute to her dysmenorrhea. Patient  verbalized understanding - Rx ibuprofen 600 mg provided - Patient referred to see Dr. Elgie Congo for [facility] consultation

## 2021-08-28 NOTE — Progress Notes (Signed)
Pt is in office for u/s results. Pt is on cycle now, has tried Tramadol with some relief. Pt is only taking it at night.

## 2021-09-01 NOTE — Progress Notes (Signed)
Office Visit Note  Patient: Hannah Fleming             Date of Birth: [birth year]           MRN: [id number]             PCP: [name], MD Referring: Caryl Never* Visit Date: 09/04/2021 Occupation: _0 @  Subjective:  Fatigue  History of Present Illness: Hannah Fleming is a 41 y.o. female with history of positive ANA, osteoarthritis, and myofascial pain. She denies any signs or symptoms of autoimmune disease.  She has not had any recent rashes, hair loss, or raynaud's. She denies any oral or nasal ulcerations.  She has not had any sicca symptoms.  She denies any shortness of breath, palpitations, or pleuritic chest pain.  She denies swollen lymph nodes or fevers.  She has noticed some increased fatigue and experiences intermittent myalgias and muscle tenderness due to myofascial pain.  She has noticed some increased bruising recently.  She states she is recent diagnosed with uterine fibroids and has an upcoming appointment with her gynecologist to discuss the neck steps in management.  She has been taking Tylenol as needed for symptomatic relief and ibuprofen sparingly. She denies any other new medical conditions or concerns at this time.   Activities of Daily Living:  Patient reports morning stiffness for 5 minutes.   Patient Denies nocturnal pain.  Difficulty dressing/grooming: Denies Difficulty climbing stairs: Denies Difficulty getting out of chair: Denies Difficulty using hands for taps, buttons, cutlery, and/or writing: Denies  Review of Systems  Constitutional:  Negative for fatigue.  HENT:  Negative for mouth dryness.   Eyes:  Negative for dryness.  Respiratory:  Negative for shortness of breath.   Cardiovascular:  Positive for swelling in legs/feet.  Gastrointestinal:  Negative for constipation.  Endocrine: Positive for cold intolerance and increased urination.  Genitourinary:  Negative for difficulty urinating.  Musculoskeletal:  Positive for  joint pain, joint pain and morning stiffness.  Skin:  Negative for rash.  Allergic/Immunologic: Negative for susceptible to infections.  Neurological:  Negative for numbness.  Hematological:  Positive for bruising/bleeding tendency.  Psychiatric/Behavioral:  Positive for sleep disturbance.    PMFS History:  Patient Active Problem List   Diagnosis Date Noted   Bacterial vaginitis 11/27/2020   Candida vaginitis 11/27/2020   Prediabetes 06/21/2019   Unspecified disorder of menstruation and other abnormal bleeding from female genital tract 12/28/2013   Anxiety 02/07/2013   Dysmenorrhea 02/07/2013    Past Medical History:  Diagnosis Date   Allergy    Phreesia 02/29/2020   Fibroids    Genital herpes    no outbreak in 52yr   Infection    UTI   No pertinent past medical history    Trichomonas infection    Urticaria     Family History  Problem Relation Age of Onset   Healthy Mother    Sickle cell trait Mother    Healthy Father    Sickle cell trait Sister    Sickle cell trait Brother    Healthy Daughter    Healthy Son    Healthy Son    Other Neg Hx    Past Surgical History:  Procedure Laterality Date   DILATION AND CURETTAGE OF UTERUS     INDUCED ABORTION     WISDOM TOOTH EXTRACTION     Social History   Social History Narrative   Not on file   Immunization History  Administered Date(s) Administered  Rho (D) Immune Globulin 06/03/2012     Objective: Vital Signs: BP 121/80 (BP Location: Left Arm, Patient Position: Sitting, Cuff Size: Normal)    Pulse 88    Resp 14    Ht _0  (1.626 m)    Wt 199 lb (90.3 kg)    LMP 08/27/2021    BMI 34.16 kg/m    Physical Exam Vitals and nursing note reviewed.  Constitutional:      Appearance: She is well-developed.  HENT:     Head: Normocephalic and atraumatic.  Eyes:     Conjunctiva/sclera: Conjunctivae normal.  Pulmonary:     Effort: Pulmonary effort is normal.  Abdominal:     Palpations: Abdomen is soft.   Musculoskeletal:     Cervical back: Normal range of motion.  Skin:    General: Skin is warm and dry.     Capillary Refill: Capillary refill takes less than 2 seconds.  Neurological:     Mental Status: She is alert and oriented to person, place, and time.  Psychiatric:        Behavior: Behavior normal.     Musculoskeletal Exam: C-spine, thoracic spine, lumbar spine good range of motion with no discomfort.  Shoulder joints, elbow joints, wrist joints, MCPs, PIPs, DIPs have good range of motion with no synovitis.  Complete fist formation bilaterally. Left hip has limited ROM.  Right hip has good ROM with no discomfort.  Some tenderness over bilateral trochanteric bursa.  Knee joints have good range of motion with no warmth or effusion.  Ankle joints have good range of motion with no tenderness or joint swelling.  CDAI Exam: CDAI Score: -- Patient Global: --; Provider Global: -- Swollen: --; Tender: -- Joint Exam 09/04/2021   No joint exam has been documented for this visit   There is currently no information documented on the homunculus. Go to the Rheumatology activity and complete the homunculus joint exam.  Investigation: No additional findings.  Imaging: US PELVIS (TRANSABDOMINAL ONLY)  Result Date: 08/06/2021 CLINICAL DATA:  Abnormal uterine bleeding, dysmenorrhea, LMP 07/25/2021. G8P3, prior D and C, induced abortion EXAM: TRANSABDOMINAL ULTRASOUND OF PELVIS TECHNIQUE: Transabdominal ultrasound examination of the pelvis was performed including evaluation of the uterus, ovaries, adnexal regions, and pelvic cul-de-sac. Was not performed. COMPARISON:  None FINDINGS: Uterus Measurements: 10.8 x 5.3 x 7.1 cm = volume: 211 mL. Retroflexed. Suboptimally evaluated due to position and inadequate bladder distention. Suspected small posterior wall submucosal leiomyoma 2.6 x 3.6 x 2.9 cm. No additional masses. Endometrium Thickness: 5 mm.  No endometrial fluid or mass Right ovary Not  visualized, likely obscured by bowel Left ovary Not visualized, likely obscured by bowel Other findings:  No free pelvic fluid.  No adnexal masses. IMPRESSION: Limited exam due to inadequate bladder distention and bowel. Probable submucosal leiomyoma posterior mid uterus 3.6 cm greatest diameter. Nonvisualization of ovaries. Electronically Signed   By: Lavonia Dana M.D.   On: 08/06/2021 16:28    Recent Labs: Lab Results  Component Value Date   WBC 8.5 07/22/2020   HGB 13.0 07/22/2020   PLT 296 07/22/2020   NA 137 07/22/2020   K 4.3 07/22/2020   CL 103 07/22/2020   CO2 26 07/22/2020   GLUCOSE 89 07/22/2020   BUN 15 07/22/2020   CREATININE 0.69 07/22/2020   BILITOT 0.4 07/22/2020   ALKPHOS 60 12/06/2019   AST 10 07/22/2020   ALT 11 07/22/2020   PROT 7.4 07/22/2020   ALBUMIN 4.4 12/06/2019   CALCIUM 9.4  07/22/2020   GFRAA 128 07/22/2020    Speciality Comments: No specialty comments available.  Procedures:  No procedures performed Allergies: Prilosec otc [omeprazole magnesium] and Latex   Assessment / Plan:     Visit Diagnoses: Positive ANA (antinuclear antibody) - Repeat ANA negative, RNP positive, TPO antibody positive: She is not exhibiting any signs or symptoms of systemic lupus or mixed connective tissue disease at this time.  She has not noticed any new or worsening symptoms since her last office visit in July 2022.  She had no synovitis on examination today.  She has not had any recent rashes, increased photosensitivity, hair loss, or Raynaud's phenomenon.  She has not had any cervical lymphadenopathy.  No oral or nasal ulcerations.  She has not had any sicca symptoms.  No shortness of breath, pleuritic chest pain, or palpitations.  Her lungs were clear to auscultation on examination today and her heart rate was regular rate and rhythm.  She has noticed some increased fatigue but attributes this to not sleeping well at night as well as working 2 jobs. Lab work from 02/26/2021 was  reviewed today in the office: ANA negative, double-stranded DNA negative, complements within normal limits, ESR within normal limits, lupus anticoagulant not detected, beta-2 glycoprotein antibodies negative, cardiolipin antibodies negative, RNP 2.6.  The following lab work will be repeated today.  She does not require immunosuppressive therapy at this time.  She was advised to notify us if she develops any new or worsening symptoms.  Warning signs to monitor for were discussed today in detail.  She will follow-up in the office in 6 months or sooner if necessary.- Plan: COMPLETE METABOLIC PANEL WITH GFR, CBC with Differential/Platelet, Urinalysis, Routine w reflex microscopic, ANA, Anti-DNA antibody, double-stranded, C3 and C4, Sedimentation rate, Beta-2 glycoprotein antibodies, Cardiolipin antibodies, IgG, IgM, IgA, Lupus Anticoagulant Eval w/Reflex, RNP Antibody  Primary osteoarthritis of both hands: She has no tenderness or inflammation on examination. Complete fist formation bilaterally.   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 She has good ROM of both knee joints with no discomfort.  No warmth or effusion noted.   Primary osteoarthritis of both feet: She is not currently experiencing any discomfort in her feet.  She has occasional pain in her ankle joints if she walks a prolonged distance.   Myofascial pain: She has occasional myalgias and muscle tenderness due to myofascial pain.   Trapezius muscle spasm: She experiences trapezius muscle tension and tenderness intermittently.  She has not had any muscle spasms recently.  Trochanteric bursitis of both hips: She has tenderness to palpation over bilateral trochanteric bursa.   Arthropathy of lumbar facet joint: She experiences intermittent discomfort in her lower back especially on  the left side.   Other idiopathic scoliosis, lumbar region  Other medical conditions are listed as  follows:   Anxiety  Prediabetes  Dysmenorrhea  Hives - Related to consuming certain foods per patient.  Orders: Orders Placed This Encounter  Procedures   COMPLETE METABOLIC PANEL WITH GFR   CBC with Differential/Platelet   Urinalysis, Routine w reflex microscopic   ANA   Anti-DNA antibody, double-stranded   C3 and C4   Sedimentation rate   Beta-2 glycoprotein antibodies   Cardiolipin antibodies, IgG, IgM, IgA   Lupus Anticoagulant Eval w/Reflex   RNP Antibody   No orders of the defined types were placed in this encounter.    Follow-Up Instructions: Return in about 6 months (around 03/04/2022) for +ANA, Myofascial pain,  Osteoarthritis.   [name], PA-C  Note - This record has been created using Dragon software.  Chart creation errors have been sought, but may not always  have been located. Such creation errors do not reflect on  the standard of medical care.

## 2021-09-04 ENCOUNTER — Encounter: Payer: Self-pay | Admitting: Physician Assistant

## 2021-09-04 ENCOUNTER — Other Ambulatory Visit: Payer: Self-pay

## 2021-09-04 ENCOUNTER — Ambulatory Visit (INDEPENDENT_AMBULATORY_CARE_PROVIDER_SITE_OTHER): Payer: Medicaid Other | Admitting: Physician Assistant

## 2021-09-04 VITALS — BP 121/80 | HR 88 | Resp 14 | Ht 64.0 in | Wt 199.0 lb

## 2021-09-04 DIAGNOSIS — R768 Other specified abnormal immunological findings in serum: Secondary | ICD-10-CM

## 2021-09-04 DIAGNOSIS — N946 Dysmenorrhea, unspecified: Secondary | ICD-10-CM

## 2021-09-04 DIAGNOSIS — M19041 Primary osteoarthritis, right hand: Secondary | ICD-10-CM

## 2021-09-04 DIAGNOSIS — M19071 Primary osteoarthritis, right ankle and foot: Secondary | ICD-10-CM

## 2021-09-04 DIAGNOSIS — M62838 Other muscle spasm: Secondary | ICD-10-CM

## 2021-09-04 DIAGNOSIS — M4126 Other idiopathic scoliosis, lumbar region: Secondary | ICD-10-CM | POA: Diagnosis not present

## 2021-09-04 DIAGNOSIS — L509 Urticaria, unspecified: Secondary | ICD-10-CM

## 2021-09-04 DIAGNOSIS — M7061 Trochanteric bursitis, right hip: Secondary | ICD-10-CM | POA: Diagnosis not present

## 2021-09-04 DIAGNOSIS — M19042 Primary osteoarthritis, left hand: Secondary | ICD-10-CM

## 2021-09-04 DIAGNOSIS — F419 Anxiety disorder, unspecified: Secondary | ICD-10-CM

## 2021-09-04 DIAGNOSIS — R7303 Prediabetes: Secondary | ICD-10-CM | POA: Diagnosis not present

## 2021-09-04 DIAGNOSIS — M7918 Myalgia, other site: Secondary | ICD-10-CM

## 2021-09-04 DIAGNOSIS — M19072 Primary osteoarthritis, left ankle and foot: Secondary | ICD-10-CM | POA: Diagnosis not present

## 2021-09-04 DIAGNOSIS — M47816 Spondylosis without myelopathy or radiculopathy, lumbar region: Secondary | ICD-10-CM

## 2021-09-04 DIAGNOSIS — M170 Bilateral primary osteoarthritis of knee: Secondary | ICD-10-CM

## 2021-09-04 DIAGNOSIS — M7062 Trochanteric bursitis, left hip: Secondary | ICD-10-CM

## 2021-09-05 ENCOUNTER — Telehealth: Payer: Self-pay

## 2021-09-05 NOTE — Progress Notes (Signed)
CBC and CMP WNL.  Complements WNL.  ESR WNL.  UA revealed trace leukocytes and few bacteria.  Negative nitrites.  Please clarify if she is experiencing any symptoms of a UTI.

## 2021-09-05 NOTE — Telephone Encounter (Signed)
Patient left a voicemail stating she was returning Sharon's call regarding her labwork results. 

## 2021-09-05 NOTE — Telephone Encounter (Signed)
Returned patient's call. See lab note for details.

## 2021-09-08 NOTE — Progress Notes (Signed)
RNP remains positive.  ANA is negative.  dsDNA is negative.  Beta-2 glycoproteins and anticardiolipin antibodies are negative.

## 2021-09-09 LAB — CBC WITH DIFFERENTIAL/PLATELET
Absolute Monocytes: 593 cells/uL (ref 200–950)
Basophils Absolute: 43 cells/uL (ref 0–200)
Basophils Relative: 0.5 %
Eosinophils Absolute: 163 cells/uL (ref 15–500)
Eosinophils Relative: 1.9 %
HCT: 39.2 % (ref 35.0–45.0)
Hemoglobin: 12.9 g/dL (ref 11.7–15.5)
Lymphs Abs: 2528 cells/uL (ref 850–3900)
MCH: 29.7 pg (ref 27.0–33.0)
MCHC: 32.9 g/dL (ref 32.0–36.0)
MCV: 90.1 fL (ref 80.0–100.0)
MPV: 10.8 fL (ref 7.5–12.5)
Monocytes Relative: 6.9 %
Neutro Abs: 5272 cells/uL (ref 1500–7800)
Neutrophils Relative %: 61.3 %
Platelets: 300 10*3/uL (ref 140–400)
RBC: 4.35 10*6/uL (ref 3.80–5.10)
RDW: 12 % (ref 11.0–15.0)
Total Lymphocyte: 29.4 %
WBC: 8.6 10*3/uL (ref 3.8–10.8)

## 2021-09-09 LAB — URINALYSIS, ROUTINE W REFLEX MICROSCOPIC
Bilirubin Urine: NEGATIVE
Glucose, UA: NEGATIVE
Hgb urine dipstick: NEGATIVE
Hyaline Cast: NONE SEEN /LPF
Ketones, ur: NEGATIVE
Nitrite: NEGATIVE
Protein, ur: NEGATIVE
RBC / HPF: NONE SEEN /HPF (ref 0–2)
Specific Gravity, Urine: 1.023 (ref 1.001–1.035)
pH: 6 (ref 5.0–8.0)

## 2021-09-09 LAB — COMPLETE METABOLIC PANEL WITH GFR
AG Ratio: 1.5 (calc) (ref 1.0–2.5)
ALT: 10 U/L (ref 6–29)
AST: 12 U/L (ref 10–30)
Albumin: 4.1 g/dL (ref 3.6–5.1)
Alkaline phosphatase (APISO): 49 U/L (ref 31–125)
BUN: 11 mg/dL (ref 7–25)
CO2: 31 mmol/L (ref 20–32)
Calcium: 9.4 mg/dL (ref 8.6–10.2)
Chloride: 104 mmol/L (ref 98–110)
Creat: 0.94 mg/dL (ref 0.50–0.97)
Globulin: 2.8 g/dL (calc) (ref 1.9–3.7)
Glucose, Bld: 75 mg/dL (ref 65–99)
Potassium: 4.1 mmol/L (ref 3.5–5.3)
Sodium: 139 mmol/L (ref 135–146)
Total Bilirubin: 0.4 mg/dL (ref 0.2–1.2)
Total Protein: 6.9 g/dL (ref 6.1–8.1)
eGFR: 79 mL/min/{1.73_m2} (ref >=60)

## 2021-09-09 LAB — BETA-2 GLYCOPROTEIN ANTIBODIES
Beta-2 Glyco 1 IgA: <2 U/mL
Beta-2 Glyco 1 IgM: 2 U/mL
Beta-2 Glyco I IgG: <2 U/mL

## 2021-09-09 LAB — CARDIOLIPIN ANTIBODIES, IGG, IGM, IGA
Anticardiolipin IgA: <2 APL-U/mL
Anticardiolipin IgG: <2 GPL-U/mL
Anticardiolipin IgM: <2 MPL-U/mL

## 2021-09-09 LAB — LUPUS ANTICOAGULANT EVAL W/ REFLEX
PTT-LA Screen: 43 s — ABNORMAL HIGH (ref <=40)
dRVVT: 37 s (ref <=45)

## 2021-09-09 LAB — RNP ANTIBODY: Ribonucleic Protein(ENA) Antibody, IgG: 2.6 AI — AB

## 2021-09-09 LAB — C3 AND C4
C3 Complement: 142 mg/dL (ref 83–193)
C4 Complement: 36 mg/dL (ref 15–57)

## 2021-09-09 LAB — ANTI-DNA ANTIBODY, DOUBLE-STRANDED: ds DNA Ab: <1 IU/mL

## 2021-09-09 LAB — MICROSCOPIC MESSAGE

## 2021-09-09 LAB — RFLX HEXAGONAL PHASE CONFIRM: Hexagonal Phase Conf: NEGATIVE

## 2021-09-09 LAB — ANA: Anti Nuclear Antibody (ANA): NEGATIVE

## 2021-09-09 LAB — SEDIMENTATION RATE: Sed Rate: 9 mm/h (ref 0–20)

## 2021-09-09 NOTE — Progress Notes (Signed)
Lupus anticoagulant not detected.

## 2021-09-16 ENCOUNTER — Other Ambulatory Visit (HOSPITAL_COMMUNITY)
Admission: RE | Admit: 2021-09-16 | Discharge: 2021-09-16 | Disposition: A | Discharge status: Home / Self Care | Payer: Medicaid Other | Source: Ambulatory Visit | Attending: Family Medicine | Admitting: Family Medicine

## 2021-09-16 ENCOUNTER — Encounter: Payer: Self-pay | Admitting: Family Medicine

## 2021-09-16 ENCOUNTER — Other Ambulatory Visit: Payer: Self-pay

## 2021-09-16 ENCOUNTER — Ambulatory Visit (INDEPENDENT_AMBULATORY_CARE_PROVIDER_SITE_OTHER): Payer: Medicaid Other | Admitting: Family Medicine

## 2021-09-16 VITALS — BP 119/84 | HR 85 | Resp 18 | Ht 64.0 in | Wt 194.1 lb

## 2021-09-16 DIAGNOSIS — R300 Dysuria: Secondary | ICD-10-CM

## 2021-09-16 DIAGNOSIS — B9689 Other specified bacterial agents as the cause of diseases classified elsewhere: Secondary | ICD-10-CM | POA: Insufficient documentation

## 2021-09-16 DIAGNOSIS — N760 Acute vaginitis: Secondary | ICD-10-CM | POA: Insufficient documentation

## 2021-09-16 LAB — POCT URINALYSIS DIP (CLINITEK)
Bilirubin, UA: NEGATIVE
Glucose, UA: NEGATIVE mg/dL
Ketones, POC UA: NEGATIVE mg/dL
Nitrite, UA: NEGATIVE
POC PROTEIN,UA: NEGATIVE
Spec Grav, UA: 1.02 (ref 1.010–1.025)
Urobilinogen, UA: 0.2 E.U./dL
pH, UA: 6.5 (ref 5.0–8.0)

## 2021-09-16 MED ORDER — NITROFURANTOIN MONOHYD MACRO 100 MG PO CAPS: 100.0000 mg | ORAL_CAPSULE | Freq: Two times a day (BID) | ORAL | 0 refills | Status: DC

## 2021-09-16 NOTE — Progress Notes (Signed)
Right lower back pain gotten worse over the last week. Shooting pain when getting up or bending over.

## 2021-09-17 ENCOUNTER — Encounter: Payer: Self-pay | Admitting: Family Medicine

## 2021-09-17 ENCOUNTER — Ambulatory Visit (INDEPENDENT_AMBULATORY_CARE_PROVIDER_SITE_OTHER): Payer: Medicaid Other | Admitting: Obstetrics and Gynecology

## 2021-09-17 ENCOUNTER — Other Ambulatory Visit: Payer: Self-pay | Admitting: Family Medicine

## 2021-09-17 VITALS — BP 105/73 | HR 84 | Ht 64.0 in | Wt 197.2 lb

## 2021-09-17 DIAGNOSIS — D250 Submucous leiomyoma of uterus: Secondary | ICD-10-CM

## 2021-09-17 DIAGNOSIS — N920 Excessive and frequent menstruation with regular cycle: Secondary | ICD-10-CM | POA: Diagnosis not present

## 2021-09-17 LAB — CERVICOVAGINAL ANCILLARY ONLY
Bacterial Vaginitis (gardnerella): POSITIVE — AB
Candida Glabrata: NEGATIVE
Candida Vaginitis: NEGATIVE
Chlamydia: NEGATIVE
Comment: NEGATIVE
Comment: NEGATIVE
Comment: NEGATIVE
Comment: NEGATIVE
Comment: NEGATIVE
Comment: NORMAL
Neisseria Gonorrhea: NEGATIVE
Trichomonas: POSITIVE — AB

## 2021-09-17 MED ORDER — METRONIDAZOLE 500 MG PO TABS: 500.0000 mg | ORAL_TABLET | Freq: Two times a day (BID) | ORAL | 0 refills | Status: AC

## 2021-09-17 NOTE — Patient Instructions (Signed)
Home - [email or web address]

## 2021-09-17 NOTE — Progress Notes (Signed)
°  CC: Sonata counseling Subjective:    Patient ID: Hannah Fleming, female    DOB: October 14, 1980, 41 y.o.   MRN: [id number]  HPI 41 yo G8P3, SVD x 3 (last 19 years ago) seen for consultation for Sonata procedure.  Previous notes reviewed.  Pt does not want hysterectomy at this time.  The patient has not tried OCP therapy or IUD.  Ultrasound findings reviewed with patient and Sonata procedure described in detail .  One small to medium size fibroid noted.    Review of Systems     Objective:   Physical Exam Vitals:   09/17/21 1552  BP: 105/73  Pulse: 84         Assessment & Plan:   1. Menorrhagia with regular cycle Could be a by product of singular submucous fibroid, will attempt to treat with Sonata procedure, website info and pamphlet given.  2. Submucous uterine fibroid Pt desires to try the Sonata procedure.  Message sent to surgical scheduler to begin insurance paper work and scheduling.  I spent 20 minutes dedicated to the care of this patient including previsit review of records, face to face time with the patient discussing ultrasound findings, procedure technique , other treatment options and post visit testing.   [name], MD Faculty Attending, [facility]

## 2021-09-17 NOTE — Progress Notes (Signed)
Established Patient Office Visit  Subjective:  Patient ID: [facility], female    DOB: 1980/11/16  Age: 41 y.o. MRN: [id number]  CC:  Chief Complaint  Patient presents with   Back Pain    HPI [facility] presents for complaint of dysuria as well as ?vaginal discharge. Patient also reports intermittent back pain but denies known trauma or injury.   Past Medical History:  Diagnosis Date   Allergy    Phreesia 02/29/2020   Fibroids    Genital herpes    no outbreak in 50yrs   Infection    UTI   No pertinent past medical history    Trichomonas infection    Urticaria     Past Surgical History:  Procedure Laterality Date   DILATION AND CURETTAGE OF UTERUS     INDUCED ABORTION     WISDOM TOOTH EXTRACTION      Family History  Problem Relation Age of Onset   Healthy Mother    Sickle cell trait Mother    Healthy Father    Sickle cell trait Sister    Sickle cell trait Brother    Healthy Daughter    Healthy Son    Healthy Son    Other Neg Hx     Social History   Socioeconomic History   Marital status: Single    Spouse name: Not on file   Number of children: 3   Years of education: Not on file   Highest education level: Not on file  Occupational History   Not on file  Tobacco Use   Smoking status: Former    Packs/day: 1.00    Years: 4.00    Pack years: 4.00    Types: Cigarettes    Quit date: 02/07/2009    Years since quitting: 12.6   Smokeless tobacco: Never  Vaping Use   Vaping Use: Never used  Substance and Sexual Activity   Alcohol use: Yes    Alcohol/week: 0.0 standard drinks    Comment: occasional   Drug use: No   Sexual activity: Yes    Partners: Male    Birth control/protection: None  Other Topics Concern   Not on file  Social History Narrative   Not on file   Social Determinants of Health   Financial Resource Strain: High Risk   Difficulty of Paying Living Expenses: Hard  Food Insecurity: Not on file  Transportation Needs:  No Transportation Needs   Lack of Transportation (Medical): No   Lack of Transportation (Non-Medical): No  Physical Activity: Sufficiently Active   Days of Exercise per Week: 7 days   Minutes of Exercise per Session: 30 min  Stress: Not on file  Social Connections: Not on file  Intimate Partner Violence: Not on file    ROS Review of Systems  Constitutional:  Negative for chills and fever.  Genitourinary:  Positive for dysuria and vaginal discharge.  Musculoskeletal:  Positive for back pain.  All other systems reviewed and are negative.  Objective:   Today's Vitals: BP 119/84    Pulse 85    Resp 18    Ht 5\' 4"  (1.626 m)    Wt 194 lb 2 oz (88.1 kg)    LMP 08/27/2021    SpO2 97%    BMI 33.32 kg/m   Physical Exam Vitals and nursing note reviewed.  Constitutional:      General: She is not in acute distress. Cardiovascular:     Rate and Rhythm: Normal rate and  regular rhythm.  Pulmonary:     Effort: Pulmonary effort is normal.     Breath sounds: Normal breath sounds.  Abdominal:     Palpations: Abdomen is soft.     Tenderness: There is no abdominal tenderness.  Neurological:     General: No focal deficit present.     Mental Status: She is alert and oriented to person, place, and time.    Assessment & Plan:   1. Dysuria Macrobid prescribed  2. Bacterial vaginosis Patient defers further treatment until results of swab.   - Cervicovaginal ancillary only - POCT URINALYSIS DIP (CLINITEK)    Outpatient Encounter Medications as of 09/16/2021  Medication Sig   Acetaminophen (TYLENOL PO) Take by mouth as needed.   cetirizine (ZYRTEC) 10 MG tablet Take 10 mg by mouth daily.   CRANBERRY PO Take 1 tablet by mouth daily.   famotidine (PEPCID) 20 MG tablet Take 20 mg by mouth 2 (two) times daily.   ibuprofen (ADVIL) 600 MG tablet Take 1 tablet (600 mg total) by mouth every 6 (six) hours as needed.   IBUPROFEN PO Take 800 mg by mouth as needed.   Multiple Vitamins-Minerals  (MULTIVITAMIN ADULT) TABS Take 1 tablet by mouth daily.   nitrofurantoin, macrocrystal-monohydrate, (MACROBID) 100 MG capsule Take 1 capsule (100 mg total) by mouth 2 (two) times daily.   traMADol (ULTRAM) 50 MG tablet Take by mouth every 6 (six) hours as needed for moderate pain.   No facility-administered encounter medications on file as of 09/16/2021.    Follow-up: No follow-ups on file.   Becky Sax, MD

## 2021-09-18 ENCOUNTER — Encounter: Payer: Self-pay | Admitting: Obstetrics and Gynecology

## 2021-09-23 ENCOUNTER — Emergency Department (HOSPITAL_COMMUNITY)
Admission: EM | Admit: 2021-09-23 | Discharge: 2021-09-23 | Disposition: A | Discharge status: Home / Self Care | Payer: Medicaid Other | Attending: Emergency Medicine | Admitting: Emergency Medicine

## 2021-09-23 ENCOUNTER — Other Ambulatory Visit: Payer: Self-pay

## 2021-09-23 ENCOUNTER — Emergency Department (HOSPITAL_COMMUNITY): Payer: Medicaid Other

## 2021-09-23 DIAGNOSIS — Z91040 Latex allergy status: Secondary | ICD-10-CM | POA: Diagnosis not present

## 2021-09-23 DIAGNOSIS — D259 Leiomyoma of uterus, unspecified: Secondary | ICD-10-CM | POA: Diagnosis not present

## 2021-09-23 DIAGNOSIS — D219 Benign neoplasm of connective and other soft tissue, unspecified: Secondary | ICD-10-CM | POA: Diagnosis not present

## 2021-09-23 DIAGNOSIS — K429 Umbilical hernia without obstruction or gangrene: Secondary | ICD-10-CM | POA: Diagnosis not present

## 2021-09-23 DIAGNOSIS — N9489 Other specified conditions associated with female genital organs and menstrual cycle: Secondary | ICD-10-CM | POA: Insufficient documentation

## 2021-09-23 DIAGNOSIS — R102 Pelvic and perineal pain: Secondary | ICD-10-CM | POA: Diagnosis not present

## 2021-09-23 DIAGNOSIS — R109 Unspecified abdominal pain: Secondary | ICD-10-CM | POA: Diagnosis not present

## 2021-09-23 DIAGNOSIS — D72829 Elevated white blood cell count, unspecified: Secondary | ICD-10-CM | POA: Insufficient documentation

## 2021-09-23 DIAGNOSIS — K5730 Diverticulosis of large intestine without perforation or abscess without bleeding: Secondary | ICD-10-CM | POA: Diagnosis not present

## 2021-09-23 LAB — CBC WITH DIFFERENTIAL/PLATELET
Abs Immature Granulocytes: 0.04 10*3/uL (ref 0.00–0.07)
Basophils Absolute: 0 10*3/uL (ref 0.0–0.1)
Basophils Relative: 0 %
Eosinophils Absolute: 0.2 10*3/uL (ref 0.0–0.5)
Eosinophils Relative: 2 %
HCT: 38.9 % (ref 36.0–46.0)
Hemoglobin: 12.7 g/dL (ref 12.0–15.0)
Immature Granulocytes: 0 %
Lymphocytes Relative: 25 %
Lymphs Abs: 2.8 10*3/uL (ref 0.7–4.0)
MCH: 29.7 pg (ref 26.0–34.0)
MCHC: 32.6 g/dL (ref 30.0–36.0)
MCV: 91.1 fL (ref 80.0–100.0)
Monocytes Absolute: 0.7 10*3/uL (ref 0.1–1.0)
Monocytes Relative: 6 %
Neutro Abs: 7.4 10*3/uL (ref 1.7–7.7)
Neutrophils Relative %: 67 %
Platelets: 275 10*3/uL (ref 150–400)
RBC: 4.27 MIL/uL (ref 3.87–5.11)
RDW: 13.2 % (ref 11.5–15.5)
WBC: 11.2 10*3/uL — ABNORMAL HIGH (ref 4.0–10.5)
nRBC: 0 % (ref 0.0–0.2)

## 2021-09-23 LAB — I-STAT BETA HCG BLOOD, ED (MC, WL, AP ONLY): I-stat hCG, quantitative: <5 m[IU]/mL (ref <5)

## 2021-09-23 LAB — URINALYSIS, ROUTINE W REFLEX MICROSCOPIC
Bilirubin Urine: NEGATIVE
Glucose, UA: NEGATIVE mg/dL
Ketones, ur: NEGATIVE mg/dL
Leukocytes,Ua: NEGATIVE
Nitrite: NEGATIVE
Protein, ur: NEGATIVE mg/dL
Specific Gravity, Urine: >1.03 — ABNORMAL HIGH (ref 1.005–1.030)
pH: 6.5 (ref 5.0–8.0)

## 2021-09-23 LAB — COMPREHENSIVE METABOLIC PANEL
ALT: 13 U/L (ref 0–44)
AST: 13 U/L — ABNORMAL LOW (ref 15–41)
Albumin: 3.8 g/dL (ref 3.5–5.0)
Alkaline Phosphatase: 43 U/L (ref 38–126)
Anion gap: 6 (ref 5–15)
BUN: 15 mg/dL (ref 6–20)
CO2: 26 mmol/L (ref 22–32)
Calcium: 8.9 mg/dL (ref 8.9–10.3)
Chloride: 107 mmol/L (ref 98–111)
Creatinine, Ser: 0.92 mg/dL (ref 0.44–1.00)
GFR, Estimated: >60 mL/min (ref >60)
Glucose, Bld: 110 mg/dL — ABNORMAL HIGH (ref 70–99)
Potassium: 4 mmol/L (ref 3.5–5.1)
Sodium: 139 mmol/L (ref 135–145)
Total Bilirubin: 0.5 mg/dL (ref 0.3–1.2)
Total Protein: 7.1 g/dL (ref 6.5–8.1)

## 2021-09-23 LAB — URINALYSIS, MICROSCOPIC (REFLEX): RBC / HPF: >50 RBC/hpf (ref 0–5)

## 2021-09-23 MED ORDER — KETOROLAC TROMETHAMINE 15 MG/ML IJ SOLN
15.0000 mg | Freq: Once | INTRAMUSCULAR | Status: DC
  Filled 2021-09-23: qty 1

## 2021-09-23 MED ORDER — KETOROLAC TROMETHAMINE 15 MG/ML IJ SOLN
15.0000 mg | Freq: Once | INTRAMUSCULAR | Status: AC
  Administered 2021-09-23: 15 mg via INTRAMUSCULAR

## 2021-09-23 MED ORDER — OXYCODONE-ACETAMINOPHEN 5-325 MG PO TABS
1.0000 | ORAL_TABLET | Freq: Once | ORAL | Status: AC
Start: 2021-09-23 — End: 2021-09-23
  Administered 2021-09-23: 1 via ORAL
  Filled 2021-09-23: qty 1
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
## 2021-09-23 NOTE — ED Provider Triage Note (Signed)
Emergency Medicine Provider Triage Evaluation Note  Hannah [facility] , a 41 y.o. female  was evaluated in triage.  Pt complains of pelvic cramping. States that same began several weeks ago, states she was seen by OB/GYN and told she has uterine fibroids and has a procedure scheduled related to this. States that she started her menstrual cycle 2 days ago which is 10 days early for her and that the pain during this cycle has been more significant than normal. States that the pain is in her low pelvis and back area. Took Tramadol and ibuprofen without significant relief. Denies n/v/d, dysuria, vaginal discharge  Review of Systems  Positive:  Negative: See above  Physical Exam  BP (!) 159/99 (BP Location: Right Arm)    Pulse 87    Temp 98.9 F (37.2 C) (Oral)    Resp 16    LMP 09/23/2021 (Exact Date)    SpO2 98%  Gen:   Awake, no distress   Resp:  Normal effort  MSK:   Moves extremities without difficulty  Other:    Medical Decision Making  Medically screening exam initiated at 10:58 AM.  Appropriate orders placed.  LEXIS POTENZA was informed that the remainder of the evaluation will be completed by another provider, this initial triage assessment does not replace that evaluation, and the importance of remaining in the ED until their evaluation is complete.     Bud Face, PA-C 09/23/21 1101

## 2021-09-23 NOTE — ED Triage Notes (Signed)
Pt reports lower abdominal pain secondary to menstrual cramps. Recently dx with fibroids. Also c/o back pain. Took three ibuprofen and a half tablet of tramadol this am.

## 2021-09-23 NOTE — Discharge Instructions (Addendum)
Your work-up today was very reassuring.  You do not have any evidence of kidney stone, this is likely a flareup of pain from your fibroids.  I see you have been working very closely with your OB/GYN about getting this managed.  Please follow-up with her as needed.  Return if you have sudden worsening of pain causing vomiting or fevers.  You are not to medications as prescribed by your regular doctor, and these are safe to take together.  I hope you feel better soon.

## 2021-09-23 NOTE — ED Provider Notes (Signed)
[facility] Provider Note   CSN: [id number] Arrival date & time: 09/23/21  1027     History  No chief complaint on file.   Hannah Fleming is a 41 y.o. female with recent diagnosis of fibroids presents to the ED for evaluation of acutely worsening right-sided pelvic pain that radiates up into her back.  Pain woke her up at around 4 AM this morning due to the severity.  She describes it as sharp, worse with movement and palpation.  She also states that she is having trouble going to the bathroom as she felt like she had a full bladder but was unable to produce urine.  She took ibuprofen and half a tablet tramadol without significant relief.  She states that she has been having regular cycles and this is her second one of the month as well.  She denies vomiting, diarrhea, constipation.  She has no other complaints.  HPI     Home Medications Prior to Admission medications   Medication Sig Start Date End Date Taking? Authorizing Provider  Acetaminophen (TYLENOL PO) Take 650 mg by mouth as needed.   Yes [provider]  cetirizine (ZYRTEC) 10 MG tablet Take 10 mg by mouth daily.   Yes [provider]  famotidine (PEPCID) 20 MG tablet Take 20 mg by mouth 2 (two) times daily.   Yes [provider]  ibuprofen (ADVIL) 600 MG tablet Take 1 tablet (600 mg total) by mouth every 6 (six) hours as needed. Patient taking differently: Take 600 mg by mouth every 6 (six) hours as needed for fever, headache, mild pain, moderate pain or cramping. 08/28/21  Yes Constant, Peggy, MD  metroNIDAZOLE (FLAGYL) 500 MG tablet Take 1 tablet (500 mg total) by mouth 2 (two) times daily for 7 days. 09/17/21 09/24/21 Yes [name], MD  Multiple Vitamins-Minerals (MULTIVITAMIN ADULT) TABS Take 1 tablet by mouth daily.   Yes [provider]  nitrofurantoin, macrocrystal-monohydrate, (MACROBID) 100 MG capsule Take 1 capsule (100 mg total) by mouth 2  (two) times daily. Patient not taking: Reported on 09/23/2021 09/16/21   [name], MD      Allergies    Prilosec otc [omeprazole magnesium] and Latex    Review of Systems   Review of Systems  Constitutional:  Negative for fever.  HENT: Negative.    Eyes: Negative.   Respiratory:  Negative for shortness of breath.   Cardiovascular: Negative.   Gastrointestinal:  Negative for abdominal pain and vomiting.  Endocrine: Negative.   Genitourinary:  Positive for difficulty urinating, flank pain and pelvic pain.  Skin:  Negative for rash.  Neurological:  Negative for headaches.  All other systems reviewed and are negative.  Physical Exam Updated Vital Signs BP 117/81    Pulse 77    Temp 98.9 F (37.2 C) (Oral)    Resp 16    LMP 09/23/2021 (Exact Date)    SpO2 100%  Physical Exam Vitals and nursing note reviewed.  Constitutional:      General: She is not in acute distress.    Appearance: She is not ill-appearing.  HENT:     Head: Atraumatic.  Eyes:     Conjunctiva/sclera: Conjunctivae normal.  Cardiovascular:     Rate and Rhythm: Normal rate and regular rhythm.     Pulses: Normal pulses.     Heart sounds: No murmur heard. Pulmonary:     Effort: Pulmonary effort is normal. No respiratory distress.     Breath  sounds: Normal breath sounds.  Abdominal:     General: Abdomen is flat. There is no distension.     Palpations: Abdomen is soft.     Tenderness: There is no abdominal tenderness.     Comments: Abdomen is soft, nondistended.  She is tender to even light palpation of the right pelvic/RLQ.  Positive right-sided CVA tenderness.  Musculoskeletal:        General: Normal range of motion.     Cervical back: Normal range of motion.  Skin:    General: Skin is warm and dry.     Capillary Refill: Capillary refill takes less than 2 seconds.  Neurological:     General: No focal deficit present.     Mental Status: She is alert.  Psychiatric:        Mood and Affect: Mood normal.     ED Results / Procedures / Treatments   Labs (all labs ordered are listed, but only abnormal results are displayed) Labs Reviewed  CBC WITH DIFFERENTIAL/PLATELET - Abnormal; Notable for the following components:      Result Value   WBC 11.2 (*)    All other components within normal limits  COMPREHENSIVE METABOLIC PANEL - Abnormal; Notable for the following components:   Glucose, Bld 110 (*)    AST 13 (*)    All other components within normal limits  URINALYSIS, ROUTINE W REFLEX MICROSCOPIC - Abnormal; Notable for the following components:   Specific Gravity, Urine >1.030 (*)    Hgb urine dipstick LARGE (*)    All other components within normal limits  URINALYSIS, MICROSCOPIC (REFLEX) - Abnormal; Notable for the following components:   Bacteria, UA RARE (*)    All other components within normal limits  I-STAT BETA HCG BLOOD, ED ([facility], [facility], [facility] ONLY)    EKG None  Radiology CT Renal Stone Study  Result Date: 09/23/2021 CLINICAL DATA:  Right flank pain EXAM: CT ABDOMEN AND PELVIS WITHOUT CONTRAST TECHNIQUE: Multidetector CT imaging of the abdomen and pelvis was performed following the standard protocol without IV contrast. RADIATION DOSE REDUCTION: This exam was performed according to the departmental dose-optimization program which includes automated exposure control, adjustment of the mA and/or kV according to patient size and/or use of iterative reconstruction technique. COMPARISON:  CT abdomen and pelvis 11/19/2013 FINDINGS: Lower chest: No acute abnormality. Hepatobiliary: No focal liver abnormality is seen. No gallstones, gallbladder wall thickening, or biliary dilatation. Pancreas: Unremarkable. No pancreatic ductal dilatation or surrounding inflammatory changes. Spleen: Normal in size without focal abnormality. Adrenals/Urinary Tract: Adrenal glands are unremarkable. Kidneys are normal, without renal calculi, focal lesion, or hydronephrosis. Bladder is unremarkable. Stomach/Bowel: No  bowel obstruction, free air or pneumatosis. Colonic diverticulosis. No bowel wall edema identified. Appendix is normal. Vascular/Lymphatic: No significant vascular findings are present. No enlarged abdominal or pelvic lymph nodes. Reproductive: Uterus and bilateral adnexa are unremarkable. Other: Small umbilical hernia containing fat.  No ascites. Musculoskeletal: No acute or significant osseous findings. IMPRESSION: 1. No nephrolithiasis or obstructive uropathy identified. 2. Colonic diverticulosis. Electronically Signed   By: [name] M.D.   On: 09/23/2021 16:11    Procedures Procedures    Medications Ordered in ED Medications  oxyCODONE-acetaminophen (PERCOCET/ROXICET) 5-325 MG per tablet 1 tablet (1 tablet Oral Given 09/23/21 1546)  ketorolac (TORADOL) 15 MG/ML injection 15 mg (15 mg Intramuscular Given 09/23/21 1546)    ED Course/ Medical Decision Making/ A&P  Medical Decision Making Amount and/or Complexity of Data Reviewed Radiology: ordered.  Risk Prescription drug management.   History:  MARCHELL FROMAN is a 41 y.o. female with recent diagnosis of fibroids presents to the ED for evaluation of acutely worsening right-sided pelvic pain that radiates up into her back.  Pain woke her up at around 4 AM this morning due to the severity.  She describes it as sharp, worse with movement and palpation.  She also states that she is having trouble going to the bathroom as she felt like she had a full bladder but was unable to produce urine.  She took ibuprofen and half a tablet tramadol without significant relief.  She states that she has been having regular cycles and this is her second one of the month as well.  She denies vomiting, diarrhea, constipation.  She has no other complaints. 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   Differential diagnosis of her lower abdominal considerations include pelvic inflammatory disease, ectopic pregnancy, appendicitis, urinary calculi, primary dysmenorrhea, septic abortion, ruptured ovarian cyst or tumor, ovarian torsion, tubo-ovarian abscess, degeneration of fibroid, endometriosis, diverticulitis, cystitis.  Initial impression:  Patient is lying in bed uncomfortable although nontoxic appearing.  Exam significant for right pelvic/right lower quadrant pain that radiates to the back.  Positive CVA tenderness.  She does have a recent diagnosis of fibroids but she had a sudden acute worsening of pain this morning. Given the fact that she has blood in urine and had difficulty producing urine this morning I will obtain CT renal stone study.  Pain management indicated.  Labs available at time of evaluation: Negative pregnancy test CMP unremarkable CBC with leukocytosis Urinalysis with large amounts of urine without other evidence of infection.  Lab Tests and EKG:  I Ordered, reviewed, and interpreted labs and EKG.  The pertinent results in my decision-making regarding them are detailed in the ED course and/or initial impression section above.   Imaging Studies ordered:  I ordered imaging studies including CT renal stone which was negative for kidney stones.  Adnexa also appeared normal. I independently visualized and interpreted imaging and I agree with the radiologist interpretation.     Medicines ordered and prescription drug management:  I ordered medication including: Toradol 15 mg and  Percocet for pain f Reevaluation of the patient after these medicines showed that the patient improved I have reviewed the patients home medicines and have made adjustments as needed   Disposition:  After consideration of the diagnostic results, physical exam, history and the patients response to treatment feel that the patent would benefit from discharge with strict return precautions.   Uterine fibroid: Physical exam and work-up reassuring.  CT negative for stones.  Pain improved with Toradol and Percocet.  This has been a chronic issue for her, and concern for torsion is low given her physical exam and results of CT.  This is likely a flareup from her fibroid pain.  She is to continue working with her OB/GYN to get this under control.  Return precautions  were discussed.  All questions asked and answered and patient was discharged home in good condition.    Final Clinical Impression(s) / ED Diagnoses Final diagnoses:  Fibroid    Rx / DC Orders ED Discharge Orders     None         Rodena Piety 09/23/21 1629    Drenda Freeze, MD 09/23/21 2115

## 2021-09-24 ENCOUNTER — Telehealth: Payer: Self-pay | Admitting: *Deleted

## 2021-09-24 NOTE — Telephone Encounter (Signed)
Transition Care Management Unsuccessful Follow-up Telephone Call  Date of discharge and from where:  09/23/[facility]  Attempts:  1st Attempt  Reason for unsuccessful TCM follow-up call:  Left voice message

## 2021-09-25 NOTE — Telephone Encounter (Signed)
Transition Care Management Follow-up Telephone Call Date of discharge and from where: 09/23/2021 - [facility] How have you been since you were released from the hospital? "I am okay" Any questions or concerns? No  Items Reviewed: Did the pt receive and understand the discharge instructions provided? Yes  Medications obtained and verified?  N/A Other? No  Any new allergies since your discharge? No  Dietary orders reviewed? No Do you have support at home? Yes    Functional Questionnaire: (I = Independent and D = Dependent) ADLs: I  Bathing/Dressing- I  Meal Prep- I  Eating- I  Maintaining continence- I  Transferring/Ambulation- I  Managing Meds- I  Follow up appointments reviewed:  PCP Hospital f/u appt confirmed? Yes  Scheduled to see Dr. Redmond Pulling on 10/15/2021 @ 1400. [facility] f/u appt confirmed? No   Are transportation arrangements needed? No  If their condition worsens, is the pt aware to call PCP or go to the Emergency Dept.? Yes Was the patient provided with contact information for the PCP's office or ED? Yes Was to pt encouraged to call back with questions or concerns? Yes

## 2021-09-30 ENCOUNTER — Telehealth: Payer: Self-pay | Admitting: Family Medicine

## 2021-10-05 ENCOUNTER — Emergency Department (HOSPITAL_COMMUNITY): Payer: Medicaid Other

## 2021-10-05 ENCOUNTER — Emergency Department (HOSPITAL_COMMUNITY)
Admission: EM | Admit: 2021-10-05 | Discharge: 2021-10-05 | Disposition: A | Discharge status: Home / Self Care | Payer: Medicaid Other | Attending: Emergency Medicine | Admitting: Emergency Medicine

## 2021-10-05 ENCOUNTER — Encounter (HOSPITAL_COMMUNITY): Payer: Self-pay | Admitting: Emergency Medicine

## 2021-10-05 DIAGNOSIS — R519 Headache, unspecified: Secondary | ICD-10-CM | POA: Insufficient documentation

## 2021-10-05 DIAGNOSIS — Z87891 Personal history of nicotine dependence: Secondary | ICD-10-CM | POA: Insufficient documentation

## 2021-10-05 DIAGNOSIS — Y907 Blood alcohol level of 200-239 mg/100 ml: Secondary | ICD-10-CM | POA: Insufficient documentation

## 2021-10-05 DIAGNOSIS — S80919A Unspecified superficial injury of unspecified knee, initial encounter: Secondary | ICD-10-CM | POA: Diagnosis not present

## 2021-10-05 DIAGNOSIS — S299XXA Unspecified injury of thorax, initial encounter: Secondary | ICD-10-CM | POA: Diagnosis not present

## 2021-10-05 DIAGNOSIS — R4182 Altered mental status, unspecified: Secondary | ICD-10-CM | POA: Insufficient documentation

## 2021-10-05 DIAGNOSIS — M79652 Pain in left thigh: Secondary | ICD-10-CM | POA: Insufficient documentation

## 2021-10-05 DIAGNOSIS — Z20822 Contact with and (suspected) exposure to covid-19: Secondary | ICD-10-CM | POA: Diagnosis not present

## 2021-10-05 DIAGNOSIS — Z041 Encounter for examination and observation following transport accident: Secondary | ICD-10-CM | POA: Diagnosis not present

## 2021-10-05 DIAGNOSIS — R109 Unspecified abdominal pain: Secondary | ICD-10-CM | POA: Diagnosis not present

## 2021-10-05 DIAGNOSIS — T07XXXA Unspecified multiple injuries, initial encounter: Secondary | ICD-10-CM | POA: Diagnosis not present

## 2021-10-05 DIAGNOSIS — T1490XA Injury, unspecified, initial encounter: Secondary | ICD-10-CM | POA: Insufficient documentation

## 2021-10-05 DIAGNOSIS — S199XXA Unspecified injury of neck, initial encounter: Secondary | ICD-10-CM | POA: Diagnosis not present

## 2021-10-05 DIAGNOSIS — R531 Weakness: Secondary | ICD-10-CM | POA: Diagnosis not present

## 2021-10-05 DIAGNOSIS — F10129 Alcohol abuse with intoxication, unspecified: Secondary | ICD-10-CM | POA: Diagnosis not present

## 2021-10-05 DIAGNOSIS — M542 Cervicalgia: Secondary | ICD-10-CM | POA: Insufficient documentation

## 2021-10-05 DIAGNOSIS — F10920 Alcohol use, unspecified with intoxication, uncomplicated: Secondary | ICD-10-CM

## 2021-10-05 DIAGNOSIS — S3991XA Unspecified injury of abdomen, initial encounter: Secondary | ICD-10-CM | POA: Diagnosis not present

## 2021-10-05 DIAGNOSIS — I959 Hypotension, unspecified: Secondary | ICD-10-CM | POA: Diagnosis not present

## 2021-10-05 DIAGNOSIS — R079 Chest pain, unspecified: Secondary | ICD-10-CM | POA: Diagnosis not present

## 2021-10-05 LAB — I-STAT CHEM 8, ED
BUN: 15 mg/dL (ref 6–20)
Calcium, Ion: 1.14 mmol/L — ABNORMAL LOW (ref 1.15–1.40)
Chloride: 106 mmol/L (ref 98–111)
Creatinine, Ser: 1.3 mg/dL — ABNORMAL HIGH (ref 0.44–1.00)
Glucose, Bld: 117 mg/dL — ABNORMAL HIGH (ref 70–99)
HCT: 41 % (ref 36.0–46.0)
Hemoglobin: 13.9 g/dL (ref 12.0–15.0)
Potassium: 3.4 mmol/L — ABNORMAL LOW (ref 3.5–5.1)
Sodium: 143 mmol/L (ref 135–145)
TCO2: 23 mmol/L (ref 22–32)

## 2021-10-05 LAB — URINALYSIS, ROUTINE W REFLEX MICROSCOPIC
Bacteria, UA: NONE SEEN
Bilirubin Urine: NEGATIVE
Glucose, UA: NEGATIVE mg/dL
Ketones, ur: NEGATIVE mg/dL
Leukocytes,Ua: NEGATIVE
Nitrite: NEGATIVE
Protein, ur: NEGATIVE mg/dL
RBC / HPF: >50 RBC/hpf — ABNORMAL HIGH (ref 0–5)
Specific Gravity, Urine: 1.025 (ref 1.005–1.030)
pH: 7 (ref 5.0–8.0)

## 2021-10-05 LAB — COMPREHENSIVE METABOLIC PANEL
ALT: 26 U/L (ref 0–44)
AST: 31 U/L (ref 15–41)
Albumin: 3.8 g/dL (ref 3.5–5.0)
Alkaline Phosphatase: 48 U/L (ref 38–126)
Anion gap: 10 (ref 5–15)
BUN: 14 mg/dL (ref 6–20)
CO2: 23 mmol/L (ref 22–32)
Calcium: 8.9 mg/dL (ref 8.9–10.3)
Chloride: 107 mmol/L (ref 98–111)
Creatinine, Ser: 1.04 mg/dL — ABNORMAL HIGH (ref 0.44–1.00)
GFR, Estimated: >60 mL/min (ref >60)
Glucose, Bld: 120 mg/dL — ABNORMAL HIGH (ref 70–99)
Potassium: 3.3 mmol/L — ABNORMAL LOW (ref 3.5–5.1)
Sodium: 140 mmol/L (ref 135–145)
Total Bilirubin: 0.3 mg/dL (ref 0.3–1.2)
Total Protein: 7.1 g/dL (ref 6.5–8.1)

## 2021-10-05 LAB — CBC
HCT: 39.4 % (ref 36.0–46.0)
Hemoglobin: 12.8 g/dL (ref 12.0–15.0)
MCH: 29.7 pg (ref 26.0–34.0)
MCHC: 32.5 g/dL (ref 30.0–36.0)
MCV: 91.4 fL (ref 80.0–100.0)
Platelets: 273 10*3/uL (ref 150–400)
RBC: 4.31 MIL/uL (ref 3.87–5.11)
RDW: 13.2 % (ref 11.5–15.5)
WBC: 13.9 10*3/uL — ABNORMAL HIGH (ref 4.0–10.5)
nRBC: 0 % (ref 0.0–0.2)

## 2021-10-05 LAB — I-STAT BETA HCG BLOOD, ED (MC, WL, AP ONLY): I-stat hCG, quantitative: <5 m[IU]/mL (ref <5)

## 2021-10-05 LAB — RESP PANEL BY RT-PCR (FLU A&B, COVID) ARPGX2
Influenza A by PCR: NEGATIVE
Influenza B by PCR: NEGATIVE
SARS Coronavirus 2 by RT PCR: NEGATIVE

## 2021-10-05 LAB — PROTIME-INR
INR: 1 (ref 0.8–1.2)
Prothrombin Time: 13.3 seconds (ref 11.4–15.2)

## 2021-10-05 LAB — SAMPLE TO BLOOD BANK

## 2021-10-05 LAB — ETHANOL: Alcohol, Ethyl (B): 228 mg/dL — ABNORMAL HIGH (ref <10)

## 2021-10-05 LAB — LACTIC ACID, PLASMA: Lactic Acid, Venous: 1.7 mmol/L (ref 0.5–1.9)

## 2021-10-05 MED ORDER — FENTANYL CITRATE PF 50 MCG/ML IJ SOSY
50.0000 ug | PREFILLED_SYRINGE | Freq: Once | INTRAMUSCULAR | Status: AC
  Administered 2021-10-05: 50 ug via INTRAVENOUS
  Filled 2021-10-05: qty 1

## 2021-10-05 MED ORDER — IOHEXOL 300 MG/ML  SOLN
100.0000 mL | Freq: Once | INTRAMUSCULAR | Status: AC | PRN
  Administered 2021-10-05: 100 mL via INTRAVENOUS

## 2021-10-05 MED ORDER — SODIUM CHLORIDE 0.9 % IV BOLUS
1000.0000 mL | Freq: Once | INTRAVENOUS | Status: AC
  Administered 2021-10-05: 1000 mL via INTRAVENOUS

## 2021-10-05 MED ORDER — KETOROLAC TROMETHAMINE 15 MG/ML IJ SOLN
15.0000 mg | Freq: Once | INTRAMUSCULAR | Status: AC
  Administered 2021-10-05: 15 mg via INTRAVENOUS
  Filled 2021-10-05: qty 1

## 2021-10-05 MED ORDER — IBUPROFEN 400 MG PO TABS
600.0000 mg | ORAL_TABLET | Freq: Once | ORAL | Status: AC
  Administered 2021-10-05: 600 mg via ORAL
  Filled 2021-10-05: qty 1

## 2021-10-05 MED ORDER — CYCLOBENZAPRINE HCL 10 MG PO TABS: 10.0000 mg | ORAL_TABLET | Freq: Three times a day (TID) | ORAL | 0 refills | Status: DC | PRN

## 2021-10-05 MED ORDER — IBUPROFEN 600 MG PO TABS: 600.0000 mg | ORAL_TABLET | Freq: Three times a day (TID) | ORAL | 0 refills | Status: DC | PRN

## 2021-10-05 NOTE — Progress Notes (Signed)
Orthopedic Tech Progress Note Patient Details:  H[facility] [birth year] [id number]  Patient ID: [name], female   DOB: 09/08/1980, 41 y.o.   MRN: [id number] Level II; not needed at the moment.  [name] 10/05/2021, 4:12 AM
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
## 2021-10-05 NOTE — ED Notes (Signed)
Pt ambulatory to restroom, complaining of body soreness, but able to walk with no issue. Pt Aox4.

## 2021-10-05 NOTE — ED Notes (Addendum)
Patient resting /respirations unlabored, IV NS bolus infusing , IV sites intact .
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
## 2021-10-05 NOTE — ED Notes (Signed)
Patient's son at bedside.
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
## 2021-10-05 NOTE — ED Notes (Signed)
Pts sister Hannah Fleming contacted, advised her of patients status, she ask me to call pts adult daughter Hannah Fleming, no answer, mailbox is full, text message sent. [phone].   Pts daughter returned call, thanked this Therapist, sports for information.

## 2021-10-05 NOTE — ED Notes (Signed)
Warm Blanket applied.
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
## 2021-10-05 NOTE — ED Notes (Addendum)
Patient transported to CT scan . 

## 2021-10-05 NOTE — ED Notes (Addendum)
Dr. Sedonia Small at bedside speaking with patient and family member / explained tests results and plan of care.

## 2021-10-06 ENCOUNTER — Telehealth: Payer: Self-pay

## 2021-10-06 NOTE — Telephone Encounter (Signed)
Transition Care Management Unsuccessful Follow-up Telephone Call  Date of discharge and from where:  10/05/2021-[place]   Attempts:  1st Attempt  Reason for unsuccessful TCM follow-up call:  Left voice message

## 2021-10-07 ENCOUNTER — Ambulatory Visit: Payer: Self-pay | Admitting: Obstetrics

## 2021-10-07 NOTE — Telephone Encounter (Signed)
Transition Care Management Unsuccessful Follow-up Telephone Call  Date of discharge and from where:  10/05/2021 from [facility]  Attempts:  2nd Attempt  Reason for unsuccessful TCM follow-up call:  Left voice message

## 2021-10-08 NOTE — Telephone Encounter (Signed)
Transition Care Management Unsuccessful Follow-up Telephone Call  Date of discharge and from where:  10/05/2021 [facility]  Attempts:  3rd Attempt  Reason for unsuccessful TCM follow-up call:  Left voice message

## 2021-10-09 ENCOUNTER — Other Ambulatory Visit: Payer: Self-pay

## 2021-10-09 ENCOUNTER — Ambulatory Visit (INDEPENDENT_AMBULATORY_CARE_PROVIDER_SITE_OTHER): Payer: Medicaid Other

## 2021-10-09 ENCOUNTER — Ambulatory Visit
Admission: EM | Admit: 2021-10-09 | Discharge: 2021-10-09 | Disposition: A | Discharge status: Home / Self Care | Payer: Medicaid Other | Attending: Internal Medicine | Admitting: Internal Medicine

## 2021-10-09 ENCOUNTER — Encounter: Payer: Self-pay | Admitting: Emergency Medicine

## 2021-10-09 DIAGNOSIS — M79641 Pain in right hand: Secondary | ICD-10-CM | POA: Diagnosis not present

## 2021-10-09 DIAGNOSIS — M25571 Pain in right ankle and joints of right foot: Secondary | ICD-10-CM | POA: Diagnosis not present

## 2021-10-09 DIAGNOSIS — M25562 Pain in left knee: Secondary | ICD-10-CM

## 2021-10-09 DIAGNOSIS — M25561 Pain in right knee: Secondary | ICD-10-CM

## 2021-10-09 DIAGNOSIS — S99911A Unspecified injury of right ankle, initial encounter: Secondary | ICD-10-CM | POA: Diagnosis not present

## 2021-10-09 DIAGNOSIS — M25572 Pain in left ankle and joints of left foot: Secondary | ICD-10-CM

## 2021-10-09 DIAGNOSIS — Z041 Encounter for examination and observation following transport accident: Secondary | ICD-10-CM | POA: Diagnosis not present
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
## 2021-10-09 NOTE — Discharge Instructions (Signed)
All of your x-rays were negative.  Please use ice application to affected area.  You may take ibuprofen and Flexeril that was prescribed at ED to help alleviate pain and inflammation as well.  Please monitor abrasions for signs of infection that include increased redness, swelling, pus.  Follow-up if this occurs.  Please follow-up with provided contact information for orthopedist for further evaluation and management.

## 2021-10-14 ENCOUNTER — Telehealth: Payer: Self-pay | Admitting: Obstetrics and Gynecology

## 2021-10-15 ENCOUNTER — Ambulatory Visit (INDEPENDENT_AMBULATORY_CARE_PROVIDER_SITE_OTHER): Payer: Medicaid Other | Admitting: Family Medicine

## 2021-10-15 ENCOUNTER — Encounter: Payer: Self-pay | Admitting: Family Medicine

## 2021-10-15 ENCOUNTER — Other Ambulatory Visit: Payer: Self-pay

## 2021-10-15 VITALS — BP 108/76 | HR 82 | Temp 98.1°F | Resp 16 | Wt 195.2 lb

## 2021-10-15 DIAGNOSIS — R0789 Other chest pain: Secondary | ICD-10-CM

## 2021-10-15 DIAGNOSIS — S93409A Sprain of unspecified ligament of unspecified ankle, initial encounter: Secondary | ICD-10-CM | POA: Diagnosis not present

## 2021-10-15 DIAGNOSIS — S6391XA Sprain of unspecified part of right wrist and hand, initial encounter: Secondary | ICD-10-CM

## 2021-10-16 ENCOUNTER — Encounter (HOSPITAL_BASED_OUTPATIENT_CLINIC_OR_DEPARTMENT_OTHER): Payer: Self-pay | Admitting: Obstetrics and Gynecology

## 2021-10-16 ENCOUNTER — Ambulatory Visit (INDEPENDENT_AMBULATORY_CARE_PROVIDER_SITE_OTHER): Payer: Medicaid Other | Admitting: Obstetrics and Gynecology

## 2021-10-16 ENCOUNTER — Other Ambulatory Visit: Payer: Self-pay

## 2021-10-16 VITALS — BP 111/77 | HR 94 | Wt 195.5 lb

## 2021-10-16 DIAGNOSIS — Z01818 Encounter for other preprocedural examination: Secondary | ICD-10-CM

## 2021-10-16 MED ORDER — MISOPROSTOL 200 MCG PO TABS: ORAL_TABLET | ORAL | 0 refills | Status: DC
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
## 2021-10-16 NOTE — Progress Notes (Signed)
Pt is in the office to discuss ablation scheduled for 10/22/21.
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
## 2021-10-16 NOTE — Progress Notes (Signed)
Spoke w/ via phone for pre-op interview--- [place]----   CBC,CMP, T&S, UPT            Lab results------ COVID test -----patient states asymptomatic no test needed Arrive at -------0930 NPO after MN NO Solid Food.  Clear liquids from MN until---0830 Med rec completed Medications to take morning of surgery -----Flexeril PRN and Cytotec per Dr. Elgie Congo orders Diabetic medication ----- Patient instructed no nail polish to be worn day of surgery Patient instructed to bring photo id and insurance card day of surgery Patient aware to have Driver (ride ) / caregiver Daughter Charlane Ferretti   for 24 hours after surgery  Patient Special Instructions ----- Pre-Op special Istructions ----- Patient verbalized understanding of instructions that were given at this phone interview. Patient denies shortness of breath, chest pain, fever, cough at this phone interview.

## 2021-10-22 ENCOUNTER — Encounter (HOSPITAL_BASED_OUTPATIENT_CLINIC_OR_DEPARTMENT_OTHER)
Admission: RE | Disposition: A | Discharge status: Home / Self Care | Payer: Self-pay | Source: Home / Self Care | Attending: Obstetrics and Gynecology

## 2021-10-22 ENCOUNTER — Ambulatory Visit (HOSPITAL_BASED_OUTPATIENT_CLINIC_OR_DEPARTMENT_OTHER): Payer: Medicaid Other | Admitting: Certified Registered"

## 2021-10-22 ENCOUNTER — Other Ambulatory Visit: Payer: Self-pay

## 2021-10-22 ENCOUNTER — Ambulatory Visit (HOSPITAL_BASED_OUTPATIENT_CLINIC_OR_DEPARTMENT_OTHER)
Admission: RE | Admit: 2021-10-22 | Discharge: 2021-10-22 | Disposition: A | Discharge status: Home / Self Care | Payer: Medicaid Other | Attending: Obstetrics and Gynecology | Admitting: Obstetrics and Gynecology

## 2021-10-22 ENCOUNTER — Encounter (HOSPITAL_BASED_OUTPATIENT_CLINIC_OR_DEPARTMENT_OTHER): Payer: Self-pay | Admitting: Obstetrics and Gynecology

## 2021-10-22 DIAGNOSIS — Z01818 Encounter for other preprocedural examination: Secondary | ICD-10-CM

## 2021-10-22 DIAGNOSIS — N920 Excessive and frequent menstruation with regular cycle: Secondary | ICD-10-CM | POA: Diagnosis not present

## 2021-10-22 DIAGNOSIS — D251 Intramural leiomyoma of uterus: Secondary | ICD-10-CM

## 2021-10-22 DIAGNOSIS — D250 Submucous leiomyoma of uterus: Secondary | ICD-10-CM

## 2021-10-22 DIAGNOSIS — Z87891 Personal history of nicotine dependence: Secondary | ICD-10-CM | POA: Diagnosis not present

## 2021-10-22 DIAGNOSIS — D259 Leiomyoma of uterus, unspecified: Secondary | ICD-10-CM | POA: Insufficient documentation

## 2021-10-22 HISTORY — PX: DILATATION & CURETTAGE/HYSTEROSCOPY WITH MYOSURE: SHX6511

## 2021-10-22 LAB — COMPREHENSIVE METABOLIC PANEL
ALT: 14 U/L (ref 0–44)
AST: 14 U/L — ABNORMAL LOW (ref 15–41)
Albumin: 4.1 g/dL (ref 3.5–5.0)
Alkaline Phosphatase: 64 U/L (ref 38–126)
Anion gap: 5 (ref 5–15)
BUN: 14 mg/dL (ref 6–20)
CO2: 24 mmol/L (ref 22–32)
Calcium: 8.8 mg/dL — ABNORMAL LOW (ref 8.9–10.3)
Chloride: 105 mmol/L (ref 98–111)
Creatinine, Ser: 0.72 mg/dL (ref 0.44–1.00)
GFR, Estimated: >60 mL/min (ref >60)
Glucose, Bld: 95 mg/dL (ref 70–99)
Potassium: 3.8 mmol/L (ref 3.5–5.1)
Sodium: 134 mmol/L — ABNORMAL LOW (ref 135–145)
Total Bilirubin: 0.2 mg/dL — ABNORMAL LOW (ref 0.3–1.2)
Total Protein: 7.6 g/dL (ref 6.5–8.1)

## 2021-10-22 LAB — CBC
HCT: 37.8 % (ref 36.0–46.0)
Hemoglobin: 12.5 g/dL (ref 12.0–15.0)
MCH: 29.7 pg (ref 26.0–34.0)
MCHC: 33.1 g/dL (ref 30.0–36.0)
MCV: 89.8 fL (ref 80.0–100.0)
Platelets: 320 10*3/uL (ref 150–400)
RBC: 4.21 MIL/uL (ref 3.87–5.11)
RDW: 13.2 % (ref 11.5–15.5)
WBC: 7.1 10*3/uL (ref 4.0–10.5)
nRBC: 0 % (ref 0.0–0.2)

## 2021-10-22 LAB — TYPE AND SCREEN
ABO/RH(D): B NEG
Antibody Screen: NEGATIVE

## 2021-10-22 LAB — POCT PREGNANCY, URINE: Preg Test, Ur: NEGATIVE

## 2021-10-22 SURGERY — RADIOFREQUENCY ABLATION, LEIOMYOMA, UTERUS, TRANSCERVICAL APPROACH, WITH US GUIDANCE
Anesthesia: General

## 2021-10-22 MED ORDER — LACTATED RINGERS IV SOLN: INTRAVENOUS | Status: DC

## 2021-10-22 MED ORDER — KETOROLAC TROMETHAMINE 30 MG/ML IJ SOLN
INTRAMUSCULAR | Status: AC
  Filled 2021-10-22: qty 1

## 2021-10-22 MED ORDER — IBUPROFEN 600 MG PO TABS: 600.0000 mg | ORAL_TABLET | Freq: Four times a day (QID) | ORAL | 1 refills | Status: DC | PRN

## 2021-10-22 MED ORDER — MIDAZOLAM HCL 2 MG/2ML IJ SOLN
INTRAMUSCULAR | Status: AC
  Filled 2021-10-22: qty 2

## 2021-10-22 MED ORDER — ONDANSETRON HCL 4 MG/2ML IJ SOLN
INTRAMUSCULAR | Status: AC
  Filled 2021-10-22: qty 2

## 2021-10-22 MED ORDER — PROPOFOL 10 MG/ML IV BOLUS
INTRAVENOUS | Status: DC | PRN
  Administered 2021-10-22: 200 mg via INTRAVENOUS

## 2021-10-22 MED ORDER — GABAPENTIN 300 MG PO CAPS
300.0000 mg | ORAL_CAPSULE | Freq: Once | ORAL | Status: AC
  Administered 2021-10-22: 300 mg via ORAL

## 2021-10-22 MED ORDER — DEXAMETHASONE SODIUM PHOSPHATE 10 MG/ML IJ SOLN
INTRAMUSCULAR | Status: DC | PRN
  Administered 2021-10-22 (×2): 5 mg via INTRAVENOUS

## 2021-10-22 MED ORDER — KETOROLAC TROMETHAMINE 30 MG/ML IJ SOLN
INTRAMUSCULAR | Status: DC | PRN
  Administered 2021-10-22: 30 mg via INTRAVENOUS

## 2021-10-22 MED ORDER — MIDAZOLAM HCL 2 MG/2ML IJ SOLN
INTRAMUSCULAR | Status: DC | PRN
  Administered 2021-10-22: 2 mg via INTRAVENOUS

## 2021-10-22 MED ORDER — PROPOFOL 10 MG/ML IV BOLUS
INTRAVENOUS | Status: AC
  Filled 2021-10-22: qty 20

## 2021-10-22 MED ORDER — FENTANYL CITRATE (PF) 100 MCG/2ML IJ SOLN
INTRAMUSCULAR | Status: AC
  Filled 2021-10-22: qty 2

## 2021-10-22 MED ORDER — OXYCODONE HCL 5 MG/5ML PO SOLN: 5.0000 mg | Freq: Once | ORAL | Status: AC | PRN

## 2021-10-22 MED ORDER — FENTANYL CITRATE (PF) 100 MCG/2ML IJ SOLN
INTRAMUSCULAR | Status: DC | PRN
  Administered 2021-10-22: 25 ug via INTRAVENOUS
  Administered 2021-10-22: 50 ug via INTRAVENOUS
  Administered 2021-10-22: 25 ug via INTRAVENOUS

## 2021-10-22 MED ORDER — ONDANSETRON HCL 4 MG/2ML IJ SOLN
INTRAMUSCULAR | Status: DC | PRN
  Administered 2021-10-22: 4 mg via INTRAVENOUS

## 2021-10-22 MED ORDER — LIDOCAINE 2% (20 MG/ML) 5 ML SYRINGE
INTRAMUSCULAR | Status: DC | PRN
Start: 2021-10-22 — End: 2021-10-22
  Administered 2021-10-22: 30 mg via INTRAVENOUS

## 2021-10-22 MED ORDER — POVIDONE-IODINE 10 % EX SWAB: 2.0000 "application " | Freq: Once | CUTANEOUS | Status: DC

## 2021-10-22 MED ORDER — OXYCODONE HCL 5 MG PO TABS
5.0000 mg | ORAL_TABLET | Freq: Once | ORAL | Status: AC | PRN
  Administered 2021-10-22: 5 mg via ORAL

## 2021-10-22 MED ORDER — OXYCODONE HCL 5 MG PO TABS
ORAL_TABLET | ORAL | Status: AC
  Filled 2021-10-22: qty 1

## 2021-10-22 MED ORDER — SOD CITRATE-CITRIC ACID 500-334 MG/5ML PO SOLN: 30.0000 mL | ORAL | Status: DC

## 2021-10-22 MED ORDER — AMISULPRIDE (ANTIEMETIC) 5 MG/2ML IV SOLN: 10.0000 mg | Freq: Once | INTRAVENOUS | Status: DC | PRN

## 2021-10-22 MED ORDER — LIDOCAINE HCL 1 % IJ SOLN
INTRAMUSCULAR | Status: DC | PRN
  Administered 2021-10-22: 6 mL

## 2021-10-22 MED ORDER — DEXAMETHASONE SODIUM PHOSPHATE 10 MG/ML IJ SOLN
INTRAMUSCULAR | Status: AC
  Filled 2021-10-22: qty 1

## 2021-10-22 MED ORDER — ACETAMINOPHEN 500 MG PO TABS
1000.0000 mg | ORAL_TABLET | Freq: Once | ORAL | Status: AC
  Administered 2021-10-22: 1000 mg via ORAL

## 2021-10-22 MED ORDER — FENTANYL CITRATE (PF) 100 MCG/2ML IJ SOLN: 25.0000 ug | INTRAMUSCULAR | Status: DC | PRN

## 2021-10-22 MED ORDER — ACETAMINOPHEN 500 MG PO TABS
ORAL_TABLET | ORAL | Status: AC
  Filled 2021-10-22: qty 2

## 2021-10-22 MED ORDER — SODIUM CHLORIDE 0.9 % IR SOLN
Status: DC | PRN
  Administered 2021-10-22: 3000 mL

## 2021-10-22 MED ORDER — GABAPENTIN 300 MG PO CAPS
ORAL_CAPSULE | ORAL | Status: AC
  Filled 2021-10-22: qty 1

## 2021-10-22 MED ORDER — LIDOCAINE HCL (PF) 2 % IJ SOLN
INTRAMUSCULAR | Status: AC
  Filled 2021-10-22: qty 5

## 2021-10-22 MED ORDER — ACETAMINOPHEN 500 MG PO TABS: 1000.0000 mg | ORAL_TABLET | ORAL | Status: DC

## 2021-10-22 SURGICAL SUPPLY — 23 items
CATH ROBINSON RED A/P 16FR (CATHETERS) ×2 IMPLANT
CATH SILICONE 16FRX5CC (CATHETERS) ×1 IMPLANT
DEVICE MYOSURE LITE (MISCELLANEOUS) ×1 IMPLANT
DEVICE MYOSURE REACH (MISCELLANEOUS) IMPLANT
DRSG TELFA 3X8 NADH (GAUZE/BANDAGES/DRESSINGS) IMPLANT
ELECT DISPERSIVE SONATA (ELECTRODE) ×4 IMPLANT
GAUZE 4X4 16PLY ~~LOC~~+RFID DBL (SPONGE) ×1 IMPLANT
GLOVE SURG ENC MOIS LTX SZ8 (GLOVE) ×2 IMPLANT
GLOVE SURG UNDER POLY LF SZ7 (GLOVE) ×4 IMPLANT
GOWN STRL REUS W/ TWL LRG LVL3 (GOWN DISPOSABLE) ×2 IMPLANT
GOWN STRL REUS W/TWL LRG LVL3 (GOWN DISPOSABLE) ×4
HANDPIECE RFA SONATA (MISCELLANEOUS) ×2 IMPLANT
KIT PROCEDURE FLUENT (KITS) ×2 IMPLANT
KIT TURNOVER CYSTO (KITS) ×2 IMPLANT
MYOSURE XL FIBROID (MISCELLANEOUS)
PACK VAGINAL MINOR WOMEN LF (CUSTOM PROCEDURE TRAY) ×2 IMPLANT
PAD DRESSING TELFA 3X8 NADH (GAUZE/BANDAGES/DRESSINGS) ×1 IMPLANT
PAD OB MATERNITY 4.3X12.25 (PERSONAL CARE ITEMS) ×2 IMPLANT
PAD PREP 24X48 CUFFED NSTRL (MISCELLANEOUS) ×2 IMPLANT
SEAL ROD LENS SCOPE MYOSURE (ABLATOR) ×2 IMPLANT
SLEEVE SCD COMPRESS KNEE MED (STOCKING) ×2 IMPLANT
SYSTEM TISS REMOVAL MYOSURE XL (MISCELLANEOUS) IMPLANT
TOWEL OR 17X26 10 PK STRL BLUE (TOWEL DISPOSABLE) ×4 IMPLANT

## 2021-10-22 NOTE — Discharge Instructions (Addendum)
?  Post Anesthesia Home Care Instructions ? ?Activity: ?Get plenty of rest for the remainder of the day. A responsible individual must stay with you for 24 hours following the procedure.  ?For the next 24 hours, DO NOT: ?-Drive a car ?-Paediatric nurse ?-Drink alcoholic beverages ?-Take any medication unless instructed by your physician ?-Make any legal decisions or sign important papers. ? ?Meals: ?Start with liquid foods such as gelatin or soup. Progress to regular foods as tolerated. Avoid greasy, spicy, heavy foods. If nausea and/or vomiting occur, drink only clear liquids until the nausea and/or vomiting subsides. Call your physician if vomiting continues. ? ?Special Instructions/Symptoms: ?Your throat may feel dry or sore from the anesthesia or the breathing tube placed in your throat during surgery. If this causes discomfort, gargle with warm salt water. The discomfort should disappear within 24 hours. ? ?DISCHARGE INSTRUCTIONS: HYSTEROSCOPY / ENDOMETRIAL ABLATION ?The following instructions have been prepared to help you care for yourself upon your return home. ? ?May take Ibuprofen after 5:30 PM. ? ?May take stool softner while taking narcotic pain medication to prevent constipation.  Drink plenty of water. ? ?Personal hygiene: ? Use sanitary pads for vaginal drainage, not tampons. ? Shower the day after your procedure. ? NO tub baths, pools or Jacuzzis for 2-3 weeks. ? Wipe front to back after using the bathroom. ? ?Activity and limitations: ? Do NOT drive or operate any equipment for 24 hours. The effects of anesthesia are still present ?and drowsiness may result. ? Do NOT rest in bed all day. ? Walking is encouraged. ? Walk up and down stairs slowly. ? You may resume your normal activity in one to two days or as indicated by your physician. ?Sexual activity: NO intercourse for at least 2 weeks after the procedure, or as indicated by your ?Doctor. ? ?Diet: Eat a light meal as desired this evening. You may  resume your usual diet tomorrow. ? ?Return to Work: You may resume your work activities in one to two days or as indicated by your ?Doctor. ? ?What to expect after your surgery: Expect to have vaginal bleeding/discharge for 2-3 days and ?spotting for up to 10 days. It is not unusual to have soreness for up to 1-2 weeks. You may have a ?slight burning sensation when you urinate for the first day. Mild cramps may continue for a couple of ?days. You may have a regular period in 2-6 weeks. ? ?Call your doctor for any of the following: ? Excessive vaginal bleeding or clotting, saturating and changing one pad every hour. ? Inability to urinate 6 hours after discharge from hospital. ? Pain not relieved by pain medication. ? Fever of 100.4? F or greater. ? Unusual vaginal discharge or odor. ? ? ?    ?

## 2021-10-22 NOTE — Anesthesia Procedure Notes (Signed)
Procedure Name: LMA Insertion ?Date/Time: 10/22/2021 10:54 AM ?Performed by: Suan Halter, CRNA ?Pre-anesthesia Checklist: Patient identified, Emergency Drugs available, Suction available and Patient being monitored ?Patient Re-evaluated:Patient Re-evaluated prior to induction ?Oxygen Delivery Method: Circle system utilized ?Preoxygenation: Pre-oxygenation with 100% oxygen ?Induction Type: IV induction ?Ventilation: Mask ventilation without difficulty ?LMA: LMA inserted ?LMA Size: 4.0 ?Number of attempts: 1 ?Airway Equipment and Method: Bite block ?Placement Confirmation: positive ETCO2 ?Tube secured with: Tape ?Dental Injury: Teeth and Oropharynx as per pre-operative assessment  ? ? ? ? ?

## 2021-10-22 NOTE — Anesthesia Postprocedure Evaluation (Signed)
Anesthesia Post Note ? ?Patient: Hannah Fleming ? ?Procedure(s) Performed: Radio Frequency Ablation with [employer] ?[place] ? ?  ? ?Patient location during evaluation: PACU ?Anesthesia Type: General ?Level of consciousness: awake and alert ?Pain management: pain level controlled ?Vital Signs Assessment: post-procedure vital signs reviewed and stable ?Respiratory status: spontaneous breathing, nonlabored ventilation, respiratory function stable and patient connected to nasal cannula oxygen ?Cardiovascular status: blood pressure returned to baseline and stable ?Postop Assessment: no apparent nausea or vomiting ?Anesthetic complications: no ? ? ?No notable events documented. ? ?Last Vitals:  ?Vitals:  ? 10/22/21 1200 10/22/21 1215  ?BP: (!) 138/94 (!) 148/100  ?Pulse: 69 63  ?Resp: 18 14  ?Temp: 36.5 ?C   ?SpO2: 100% 100%  ?  ?Last Pain:  ?Vitals:  ? 10/22/21 1156  ?TempSrc:   ?PainSc: Asleep  ? ? ?  ?  ?  ?  ?  ?  ? ?Suzette Battiest E ? ? ? ? ?

## 2021-10-22 NOTE — Op Note (Signed)
PREOPERATIVE DIAGNOSIS: Symptomatic uterine fibroids, menorrhagia ?POSTOPERATIVE DIAGNOSIS: The same ?PROCEDURE: Transcervical Sonata uterine fibroid radiofrequency ablation, Hysteroscopy ?SURGEON:  Dr. Lynnda Shields ? ? ?INDICATIONS: 41 y.o. [id number]  here for scheduled surgery for the aforementioned diagnoses.   Risks of surgery were discussed with the patient including but not limited to: bleeding which may require transfusion; infection which may require antibiotics; injury to uterus or surrounding organs; intrauterine scarring which may impair future fertility; need for additional procedures including laparotomy or laparoscopy; and other postoperative/anesthesia complications. Written informed consent was obtained.   ? ?FINDINGS:  A 10 week size uterus.   ?Fibroid A 2.5 cm x cm at 6 o' clock ?ANESTHESIA:   General, paracervical block with 8 ml of 1.0% lidocaine ?INTRAVENOUS FLUIDS:  400 ml of LR ?FLUID DEFICITS:  115 ml of NS ?ESTIMATED BLOOD LOSS: 5 ml ?SPECIMENS: none ?COMPLICATIONS:  None immediate. ? ?PROCEDURE DETAILS:  After administering anesthesia, the patient was identified and a time out was performed and procedure was confirmed.  The patient was placed in dorsal lithotomy position and an exam under anesthesia revealed a mobile fibroid uterus and normal adnexa.  A speculum was placed, cervix was grasped with a tenaculum and sounded to a depth of 9.8 centimeters.  The cervix was easily dilated to a #27 Pratt dilator.   ? ?The Sonata handpiece was then inserted and a complete uterine survey was performed with the ultrasound.  Sonata fibroid ablation was then performed. ? ?Fibroid 1 : ablation 3.1 X 2.2 for 3:06 ? ? ?All cycles were carried out under direct ultrasound intrauterine guidance and visualization with the ablation guide noted to be within the serosa at all times.  The fibroids appeared ablated with u/s guidance and outgassing noted by U/S appearance. The operative hysteroscope was inserted and  the uterine cavity was visualized in its entirety.  Both tubal ostia were noted to be normal.   ?The uterine cavity was notable for: no obvious submucosal fibroid.  There were fronds of wispy endometrial tissue but otherwise normal in appearance.  Hysteroscopy demonstrated complete ablation on the submucosal endometrial surface where the fibroid was located. ? ?Sponge, needle, and instrument account was correct x 2.  All instruments were removed from the vagina.  Hemostasis was assured.  The patient was then awakened and taken to the recovery room I stable condition, tolerating the procedure well. ? ?The patient will be discharged to home as per PACU criteria.  Routine postoperative instructions given.  She was prescribed Ibuprofen.  She will follow up in the clinic on 3  for postoperative evaluation. ? ? ?Lynnda Shields, MD, FACOG ?Obstetrician Social research officer, government, Faculty Practice ?[facility] ?  ?

## 2021-10-22 NOTE — Transfer of Care (Signed)
Immediate Anesthesia Transfer of Care Note ? ?Patient: OMAR ORREGO ? ?Procedure(s) Performed: Procedure(s) (LRB): ?Radio Frequency Ablation with Sonata (N/A) ?DILATATION & CURETTAGE/HYSTEROSCOPY WITH MYOSURE (N/A) ? ?Patient Location: PACU ? ?Anesthesia Type: General ? ?Level of Consciousness: awake, oriented, sedated and patient cooperative ? ?Airway & Oxygen Therapy: Patient Spontanous Breathing and Patient connected to face mask oxygen ? ?Post-op Assessment: Report given to PACU RN and Post -op Vital signs reviewed and stable ? ?Post vital signs: Reviewed and stable ? ?Complications: No apparent anesthesia complications ? ?Last Vitals:  ?Vitals Value Taken Time  ?BP    ?Temp    ?Pulse    ?Resp    ?SpO2    ? ? ?Last Pain:  ?Vitals:  ? 10/22/21 1006  ?TempSrc: Oral  ?PainSc: 4   ?   ? ?Patients Stated Pain Goal: 4 (10/22/21 1006) ? ?Complications: No notable events documented. ?

## 2021-10-22 NOTE — Interval H&P Note (Signed)
History and Physical Interval Note: ? ?10/22/2021 ?10:13 AM ? ?Hannah Fleming  has presented today for surgery, with the diagnosis of Menorrhagia ?Fibroids.  The various methods of treatment have been discussed with the patient and family. After consideration of risks, benefits and other options for treatment, the patient has consented to  Procedure(s): ?Radio Frequency Ablation with Sonata (N/A) ?DILATATION & CURETTAGE/HYSTEROSCOPY WITH MYOSURE (N/A) as a surgical intervention.  The patient's history has been reviewed, patient examined, no change in status, stable for surgery.  I have reviewed the patient's chart and labs.  Questions were answered to the patient's satisfaction.   ? ? ?[name] ? ? ?

## 2021-10-22 NOTE — Anesthesia Preprocedure Evaluation (Signed)
Anesthesia Evaluation  ?Patient identified by MRN, date of birth, ID band ?Patient awake ? ? ? ?Reviewed: ?Allergy & Precautions, NPO status , Patient's Chart, lab work & pertinent test results ? ?Airway ?Mallampati: II ? ?TM Distance: >3 FB ?Neck ROM: Full ? ? ? Dental ? ?(+) Dental Advisory Given ?  ?Pulmonary ?former smoker,  ?  ?breath sounds clear to auscultation ? ? ? ? ? ? Cardiovascular ?negative cardio ROS ? ? ?Rhythm:Regular Rate:Normal ? ? ?  ?Neuro/Psych ?negative neurological ROS ?   ? GI/Hepatic ?negative GI ROS, Neg liver ROS,   ?Endo/Other  ?negative endocrine ROS ? Renal/GU ?negative Renal ROS  ? ?  ?Musculoskeletal ? ? Abdominal ?  ?Peds ? Hematology ?negative hematology ROS ?(+)   ?Anesthesia Other Findings ? ? Reproductive/Obstetrics ? ?  ? ? ? ? ? ? ? ? ? ? ? ? ? ?  ?  ? ? ? ? ? ? ? ? ?Anesthesia Physical ?Anesthesia Plan ? ?ASA: 1 ? ?Anesthesia Plan: General  ? ?Post-op Pain Management: Tylenol PO (pre-op)*, Gabapentin PO (pre-op)* and Toradol IV (intra-op)*  ? ?Induction: Intravenous ? ?PONV Risk Score and Plan: 3 and Midazolam, Dexamethasone, Ondansetron and Treatment may vary due to age or medical condition ? ?Airway Management Planned: LMA ? ?Additional Equipment: None ? ?Intra-op Plan:  ? ?Post-operative Plan: Extubation in OR ? ?Informed Consent: I have reviewed the patients History and Physical, chart, labs and discussed the procedure including the risks, benefits and alternatives for the proposed anesthesia with the patient or authorized representative who has indicated his/her understanding and acceptance.  ? ? ? ?Dental advisory given ? ?Plan Discussed with: CRNA ? ?Anesthesia Plan Comments:   ? ? ? ? ? ? ?Anesthesia Quick Evaluation ? ?

## 2021-10-23 NOTE — Addendum Note (Signed)
Addendum  created 10/23/21 1546 by Suan Halter, CRNA  ? Charge Capture section accepted  ?  ?

## 2021-10-24 NOTE — Addendum Note (Signed)
Addendum  created 10/24/21 0801 by Rogers Blocker, CRNA  ? Charge Capture section accepted  ?  ?

## 2021-10-27 ENCOUNTER — Ambulatory Visit: Payer: Medicaid Other | Admitting: Family Medicine

## 2021-10-27 ENCOUNTER — Encounter (HOSPITAL_BASED_OUTPATIENT_CLINIC_OR_DEPARTMENT_OTHER): Payer: Self-pay | Admitting: Obstetrics and Gynecology

## 2021-11-19 ENCOUNTER — Ambulatory Visit: Payer: Medicaid Other | Admitting: Family Medicine

## 2021-11-20 ENCOUNTER — Other Ambulatory Visit (HOSPITAL_COMMUNITY)
Admission: RE | Admit: 2021-11-20 | Discharge: 2021-11-20 | Disposition: A | Discharge status: Home / Self Care | Payer: Medicaid Other | Source: Ambulatory Visit | Attending: Obstetrics and Gynecology | Admitting: Obstetrics and Gynecology

## 2021-11-20 ENCOUNTER — Encounter: Payer: Self-pay | Admitting: Obstetrics and Gynecology

## 2021-11-20 ENCOUNTER — Ambulatory Visit (INDEPENDENT_AMBULATORY_CARE_PROVIDER_SITE_OTHER): Payer: Medicaid Other | Admitting: Obstetrics and Gynecology

## 2021-11-20 VITALS — BP 126/90 | HR 89 | Wt 198.0 lb

## 2021-11-20 DIAGNOSIS — Z4889 Encounter for other specified surgical aftercare: Secondary | ICD-10-CM

## 2021-11-20 DIAGNOSIS — N920 Excessive and frequent menstruation with regular cycle: Secondary | ICD-10-CM | POA: Diagnosis not present

## 2021-11-20 DIAGNOSIS — N898 Other specified noninflammatory disorders of vagina: Secondary | ICD-10-CM | POA: Insufficient documentation

## 2021-11-20 MED ORDER — MEGESTROL ACETATE 40 MG PO TABS: 40.0000 mg | ORAL_TABLET | Freq: Two times a day (BID) | ORAL | 5 refills | Status: DC

## 2021-11-20 NOTE — Progress Notes (Signed)
Pt states she is still bleeding on and off x 12 days. Pt states some vaginal odor, pt would like self swab.  ? ?

## 2021-11-20 NOTE — Progress Notes (Signed)
? ? ?  Subjective:  ?  ?Hannah Fleming is a 41 y.o. female who presents to the clinic status post Sonata ablation on 10/22/21. The patient is not having any pain.  Eating a regular diet without difficulty. Bowel movements are normal. No other significant postoperative concerns.  Pt did note fluid drainage after the procedure which is to be expected.  She then had her menses which has now lasted 12 days. ? ?The following portions of the patient's history were reviewed and updated as appropriate: allergies, current medications, past family history, past medical history, past social history, past surgical history, and problem list..  Last pap smear was negative on 03/11/18. ? ?Review of Systems ?Pertinent items are noted in HPI. ?  ?Objective:  ? ?BP 126/90   Pulse 89   Wt 198 lb (89.8 kg)   BMI 31.96 kg/m?  ?Constitutional:  Well-developed, well-nourished female in no acute distress.   ?Skin: Skin is warm and dry, no rash noted, not diaphoretic,no erythema, no pallor.  ?Cardiovascular: Normal heart rate noted  ?Respiratory: Effort and breath sounds normal, no problems with respiration noted  ?Abdomen: Soft, bowel sounds active, non-tender, no abnormal masses  ?Incision: N/a  ?Pelvic:   Deferred  ? ?Surgical pathology  ?N/a ?Assessment:  ? ?Doing well postoperatively.  Operative findings again reviewed. ?Plan:  ? ?1. Continue any current medications. ?2. Wound care discussed. ?3. Activity restrictions: none ?4. Anticipated return to work: now. ?5. Follow up in 4 months with virtual visit, pt will need AE/pap smear this summer ?6.  Routine preventative health maintenance measures emphasized. ?7.  Due to prolonged menses will prescribe megace 40 mg po BID x 7 days to slow/stop spotting. ?8.  Self swab today for vaginal odor ?Please refer to After Visit Summary for other counseling recommendations.  ? ? ?Lynnda Shields, MD, FACOG ?Attending [place] for [employer], [place]  ?

## 2021-11-21 LAB — CERVICOVAGINAL ANCILLARY ONLY
Bacterial Vaginitis (gardnerella): POSITIVE — AB
Candida Glabrata: NEGATIVE
Candida Vaginitis: POSITIVE — AB
Chlamydia: NEGATIVE
Comment: NEGATIVE
Comment: NEGATIVE
Comment: NEGATIVE
Comment: NEGATIVE
Comment: NEGATIVE
Comment: NORMAL
Neisseria Gonorrhea: NEGATIVE
Trichomonas: NEGATIVE

## 2021-11-24 ENCOUNTER — Other Ambulatory Visit: Payer: Self-pay

## 2021-11-24 DIAGNOSIS — B379 Candidiasis, unspecified: Secondary | ICD-10-CM

## 2021-11-24 DIAGNOSIS — N760 Acute vaginitis: Secondary | ICD-10-CM

## 2021-11-24 MED ORDER — METRONIDAZOLE 500 MG PO TABS: 500.0000 mg | ORAL_TABLET | Freq: Two times a day (BID) | ORAL | 0 refills | Status: DC

## 2021-11-24 MED ORDER — FLUCONAZOLE 150 MG PO TABS: 150.0000 mg | ORAL_TABLET | Freq: Once | ORAL | 0 refills | Status: AC

## 2021-11-26 ENCOUNTER — Ambulatory Visit: Payer: Medicaid Other | Admitting: Family Medicine

## 2021-11-27 ENCOUNTER — Other Ambulatory Visit: Payer: Self-pay | Admitting: Family Medicine

## 2021-11-27 ENCOUNTER — Other Ambulatory Visit: Payer: Self-pay | Admitting: Emergency Medicine

## 2021-11-27 DIAGNOSIS — Z1231 Encounter for screening mammogram for malignant neoplasm of breast: Secondary | ICD-10-CM

## 2021-11-27 DIAGNOSIS — B9689 Other specified bacterial agents as the cause of diseases classified elsewhere: Secondary | ICD-10-CM

## 2021-11-27 MED ORDER — METRONIDAZOLE 0.75 % VA GEL: 1.0000 | Freq: Every day | VAGINAL | 1 refills | Status: DC

## 2021-11-27 NOTE — Progress Notes (Signed)
TC from pt stating that she can not tolerate metronidazole tablets and requesting vaginal gel. Rx sent to pharmacy, pt informed.  ?

## 2021-12-02 ENCOUNTER — Ambulatory Visit: Payer: Medicaid Other | Admitting: Physician Assistant
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
## 2021-12-03 ENCOUNTER — Encounter: Payer: Self-pay | Admitting: Physician Assistant

## 2021-12-03 ENCOUNTER — Ambulatory Visit (INDEPENDENT_AMBULATORY_CARE_PROVIDER_SITE_OTHER): Payer: Medicaid Other | Admitting: Physician Assistant

## 2021-12-03 VITALS — BP 137/89 | HR 86 | Ht 64.0 in | Wt 196.8 lb

## 2021-12-03 DIAGNOSIS — M47816 Spondylosis without myelopathy or radiculopathy, lumbar region: Secondary | ICD-10-CM | POA: Diagnosis not present

## 2021-12-03 DIAGNOSIS — N946 Dysmenorrhea, unspecified: Secondary | ICD-10-CM

## 2021-12-03 DIAGNOSIS — M170 Bilateral primary osteoarthritis of knee: Secondary | ICD-10-CM | POA: Diagnosis not present

## 2021-12-03 DIAGNOSIS — M4126 Other idiopathic scoliosis, lumbar region: Secondary | ICD-10-CM

## 2021-12-03 DIAGNOSIS — M7918 Myalgia, other site: Secondary | ICD-10-CM | POA: Diagnosis not present

## 2021-12-03 DIAGNOSIS — M19041 Primary osteoarthritis, right hand: Secondary | ICD-10-CM | POA: Diagnosis not present

## 2021-12-03 DIAGNOSIS — M62838 Other muscle spasm: Secondary | ICD-10-CM

## 2021-12-03 DIAGNOSIS — M19071 Primary osteoarthritis, right ankle and foot: Secondary | ICD-10-CM | POA: Diagnosis not present

## 2021-12-03 DIAGNOSIS — M19072 Primary osteoarthritis, left ankle and foot: Secondary | ICD-10-CM

## 2021-12-03 DIAGNOSIS — M2550 Pain in unspecified joint: Secondary | ICD-10-CM

## 2021-12-03 DIAGNOSIS — R7303 Prediabetes: Secondary | ICD-10-CM | POA: Diagnosis not present

## 2021-12-03 DIAGNOSIS — M7061 Trochanteric bursitis, right hip: Secondary | ICD-10-CM | POA: Diagnosis not present

## 2021-12-03 DIAGNOSIS — F419 Anxiety disorder, unspecified: Secondary | ICD-10-CM

## 2021-12-03 DIAGNOSIS — R768 Other specified abnormal immunological findings in serum: Secondary | ICD-10-CM | POA: Diagnosis not present

## 2021-12-03 DIAGNOSIS — M79641 Pain in right hand: Secondary | ICD-10-CM

## 2021-12-03 DIAGNOSIS — M7062 Trochanteric bursitis, left hip: Secondary | ICD-10-CM

## 2021-12-03 DIAGNOSIS — M19042 Primary osteoarthritis, left hand: Secondary | ICD-10-CM

## 2021-12-04 NOTE — Progress Notes (Signed)
RF negative.  CRP WNL.  ESR is borderline elevated-25.  Anti-CCP and 14-3-3 eta are pending.

## 2021-12-05 NOTE — Progress Notes (Signed)
Anti-CCP negative.

## 2021-12-08 LAB — C-REACTIVE PROTEIN: CRP: 6.2 mg/L (ref <8.0)

## 2021-12-08 LAB — SEDIMENTATION RATE: Sed Rate: 25 mm/h — ABNORMAL HIGH (ref 0–20)

## 2021-12-08 LAB — RHEUMATOID FACTOR: Rheumatoid fact SerPl-aCnc: <14 IU/mL (ref <14)

## 2021-12-08 LAB — 14-3-3 ETA PROTEIN: 14-3-3 eta Protein: <0.2 ng/mL (ref <0.2)

## 2021-12-08 LAB — CYCLIC CITRUL PEPTIDE ANTIBODY, IGG: Cyclic Citrullin Peptide Ab: <16 UNITS

## 2021-12-09 NOTE — Progress Notes (Signed)
14-3-3 eta negative.  Labs are not consistent with rheumatoid arthritis.
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
## 2021-12-17 ENCOUNTER — Ambulatory Visit: Payer: Medicaid Other | Attending: Physician Assistant

## 2021-12-17 DIAGNOSIS — M7061 Trochanteric bursitis, right hip: Secondary | ICD-10-CM | POA: Insufficient documentation

## 2021-12-17 DIAGNOSIS — M6281 Muscle weakness (generalized): Secondary | ICD-10-CM | POA: Insufficient documentation

## 2021-12-17 DIAGNOSIS — M79641 Pain in right hand: Secondary | ICD-10-CM | POA: Insufficient documentation

## 2021-12-17 DIAGNOSIS — M62838 Other muscle spasm: Secondary | ICD-10-CM | POA: Insufficient documentation

## 2021-12-17 DIAGNOSIS — M170 Bilateral primary osteoarthritis of knee: Secondary | ICD-10-CM | POA: Diagnosis present

## 2021-12-17 DIAGNOSIS — M5459 Other low back pain: Secondary | ICD-10-CM | POA: Insufficient documentation

## 2021-12-17 DIAGNOSIS — M2550 Pain in unspecified joint: Secondary | ICD-10-CM | POA: Insufficient documentation

## 2021-12-17 DIAGNOSIS — M79604 Pain in right leg: Secondary | ICD-10-CM | POA: Insufficient documentation

## 2021-12-17 DIAGNOSIS — M542 Cervicalgia: Secondary | ICD-10-CM | POA: Diagnosis not present

## 2021-12-17 DIAGNOSIS — M79605 Pain in left leg: Secondary | ICD-10-CM | POA: Insufficient documentation

## 2021-12-17 DIAGNOSIS — M7062 Trochanteric bursitis, left hip: Secondary | ICD-10-CM | POA: Diagnosis not present

## 2021-12-17 DIAGNOSIS — Y939 Activity, unspecified: Secondary | ICD-10-CM | POA: Diagnosis not present

## 2021-12-17 NOTE — Patient Instructions (Signed)
? ? ? ?  [facility] Welcome to [facility]! Here you will find all the information you will need regarding your pool therapy. If you have further questions at any time, please call our office at [phone]. After completing your initial evaluation in the [facility], you may be eligible to complete a portion of your therapy in the pool. A typical week of therapy will consist of 1-2 typical physical therapy visits at our [facility] location and an additional session of therapy in the pool located at the [facility] at [address]. [address], [place] [address]. The phone number at the pool site is [phone]. Please call this number if you are running late or need to cancel your appointment.  Aquatic therapy will be offered on Wednesday mornings and Friday afternoons. Each session will last approximately 45 minutes. All scheduling and payments for aquatic therapy sessions, including cancelations, will be done through our [facility] location.  To be eligible for aquatic therapy, these criteria must be met: You must be able to independently change in the locker room and get to the pool deck. A caregiver can come with you to help if needed. There are benches for a caregiver to sit on next to the pool. No one with an open wound is permitted in the pool.  Handicap parking is available in the front and there is a drop off option for even closer accessibility. Please arrive 15 minutes prior to your appointment to prepare for your pool session. You must sign in at the front desk upon your arrival. Please be sure to attend to any toileting needs prior to entering the pool. Locker rooms for changing are available.  There is direct access to the pool deck from the locker room. You can lock your belongings in a locker or bring them with you poolside. Your therapist will greet you on the pool deck. There may be other swimmers in the pool at the  same time but your session is one-on-one with the therapist.  [facility] [address], [address] [place], [place] [address] Phone # [phone] Fax [phone]  

## 2021-12-19 ENCOUNTER — Other Ambulatory Visit: Payer: Self-pay | Admitting: Obstetrics and Gynecology

## 2021-12-25 ENCOUNTER — Ambulatory Visit: Payer: Medicaid Other | Attending: Physician Assistant | Admitting: Physical Therapy

## 2021-12-25 DIAGNOSIS — M542 Cervicalgia: Secondary | ICD-10-CM | POA: Insufficient documentation

## 2021-12-25 DIAGNOSIS — M79605 Pain in left leg: Secondary | ICD-10-CM | POA: Insufficient documentation

## 2021-12-25 DIAGNOSIS — M5459 Other low back pain: Secondary | ICD-10-CM | POA: Diagnosis present

## 2021-12-25 DIAGNOSIS — M47816 Spondylosis without myelopathy or radiculopathy, lumbar region: Secondary | ICD-10-CM | POA: Insufficient documentation

## 2021-12-25 DIAGNOSIS — G8929 Other chronic pain: Secondary | ICD-10-CM | POA: Diagnosis present

## 2021-12-25 DIAGNOSIS — M62830 Muscle spasm of back: Secondary | ICD-10-CM | POA: Diagnosis present

## 2021-12-25 DIAGNOSIS — M79604 Pain in right leg: Secondary | ICD-10-CM | POA: Insufficient documentation

## 2021-12-25 DIAGNOSIS — M6281 Muscle weakness (generalized): Secondary | ICD-10-CM | POA: Insufficient documentation

## 2021-12-25 DIAGNOSIS — M545 Low back pain, unspecified: Secondary | ICD-10-CM | POA: Insufficient documentation
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
## 2021-12-25 NOTE — Patient Instructions (Signed)
"  TAKE YOUR MEDS"  ? ?Acronym to reduce the body's inflammatory response ? ?1) MEDITATION: Mindfulness and meditation have been proven to reduce the brain's over-sensitization and stimulation ? ? ?2) EXERCISE: Activity pumps the muscles which in return washes those inflammatory cells away ? ? ?3)DIET: Eat healthy foods especially plants. Stay away from processed foods, especially sugar which has been proven to INCREASE pain levels! ? ? ?4)SLEEP: Prioritize sleep right now.  Avoid screens for the 30 minutes before bedtime.  A cool, dark room is best for sleeping.  Your body needs quality sleep for healing.   ?

## 2021-12-26 ENCOUNTER — Ambulatory Visit: Payer: Medicaid Other | Admitting: Physical Therapy

## 2022-01-05 ENCOUNTER — Ambulatory Visit: Payer: Medicaid Other

## 2022-01-05 DIAGNOSIS — M6281 Muscle weakness (generalized): Secondary | ICD-10-CM

## 2022-01-05 DIAGNOSIS — M5459 Other low back pain: Secondary | ICD-10-CM

## 2022-01-05 DIAGNOSIS — M79605 Pain in left leg: Secondary | ICD-10-CM | POA: Diagnosis not present

## 2022-01-05 DIAGNOSIS — M542 Cervicalgia: Secondary | ICD-10-CM

## 2022-01-05 DIAGNOSIS — M79604 Pain in right leg: Secondary | ICD-10-CM
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
## 2022-01-12 ENCOUNTER — Ambulatory Visit: Payer: Medicaid Other

## 2022-01-14 ENCOUNTER — Encounter: Payer: Self-pay | Admitting: Physical Therapy

## 2022-01-14 ENCOUNTER — Encounter (HOSPITAL_BASED_OUTPATIENT_CLINIC_OR_DEPARTMENT_OTHER): Payer: Self-pay | Admitting: Physical Therapy

## 2022-01-14 ENCOUNTER — Ambulatory Visit: Payer: Medicaid Other | Admitting: Physical Therapy

## 2022-01-14 DIAGNOSIS — M47816 Spondylosis without myelopathy or radiculopathy, lumbar region: Secondary | ICD-10-CM

## 2022-01-14 DIAGNOSIS — M5459 Other low back pain: Secondary | ICD-10-CM

## 2022-01-14 DIAGNOSIS — G8929 Other chronic pain: Secondary | ICD-10-CM

## 2022-01-14 DIAGNOSIS — M6281 Muscle weakness (generalized): Secondary | ICD-10-CM

## 2022-01-14 DIAGNOSIS — M542 Cervicalgia: Secondary | ICD-10-CM

## 2022-01-14 DIAGNOSIS — M79605 Pain in left leg: Secondary | ICD-10-CM | POA: Diagnosis not present

## 2022-01-14 DIAGNOSIS — M79604 Pain in right leg: Secondary | ICD-10-CM

## 2022-01-14 DIAGNOSIS — M62830 Muscle spasm of back: Secondary | ICD-10-CM
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
## 2022-01-22 ENCOUNTER — Encounter: Payer: Medicaid Other | Admitting: Physical Therapy

## 2022-01-23 ENCOUNTER — Ambulatory Visit: Payer: Medicaid Other | Admitting: Physical Therapy

## 2022-01-26 ENCOUNTER — Ambulatory Visit: Payer: Medicaid Other | Attending: Physician Assistant

## 2022-01-26 DIAGNOSIS — G8929 Other chronic pain: Secondary | ICD-10-CM | POA: Diagnosis present

## 2022-01-26 DIAGNOSIS — M6281 Muscle weakness (generalized): Secondary | ICD-10-CM | POA: Diagnosis present

## 2022-01-26 DIAGNOSIS — M79605 Pain in left leg: Secondary | ICD-10-CM | POA: Insufficient documentation

## 2022-01-26 DIAGNOSIS — M47816 Spondylosis without myelopathy or radiculopathy, lumbar region: Secondary | ICD-10-CM | POA: Diagnosis present

## 2022-01-26 DIAGNOSIS — M79604 Pain in right leg: Secondary | ICD-10-CM | POA: Insufficient documentation

## 2022-01-26 DIAGNOSIS — M62830 Muscle spasm of back: Secondary | ICD-10-CM | POA: Diagnosis present

## 2022-01-26 DIAGNOSIS — M542 Cervicalgia: Secondary | ICD-10-CM | POA: Diagnosis present

## 2022-01-26 DIAGNOSIS — M545 Low back pain, unspecified: Secondary | ICD-10-CM | POA: Diagnosis present

## 2022-01-26 DIAGNOSIS — M5459 Other low back pain: Secondary | ICD-10-CM | POA: Insufficient documentation
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
## 2022-01-30 ENCOUNTER — Ambulatory Visit: Payer: Medicaid Other | Admitting: Physical Therapy

## 2022-01-30 ENCOUNTER — Encounter: Payer: Self-pay | Admitting: Physical Therapy

## 2022-01-30 DIAGNOSIS — M62830 Muscle spasm of back: Secondary | ICD-10-CM

## 2022-01-30 DIAGNOSIS — M6281 Muscle weakness (generalized): Secondary | ICD-10-CM

## 2022-01-30 DIAGNOSIS — M79604 Pain in right leg: Secondary | ICD-10-CM

## 2022-01-30 DIAGNOSIS — M542 Cervicalgia: Secondary | ICD-10-CM

## 2022-01-30 DIAGNOSIS — M47816 Spondylosis without myelopathy or radiculopathy, lumbar region: Secondary | ICD-10-CM

## 2022-01-30 DIAGNOSIS — M5459 Other low back pain: Secondary | ICD-10-CM

## 2022-01-30 DIAGNOSIS — M79605 Pain in left leg: Secondary | ICD-10-CM | POA: Diagnosis not present

## 2022-01-30 DIAGNOSIS — M545 Other chronic pain: Secondary | ICD-10-CM
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
## 2022-02-02 ENCOUNTER — Ambulatory Visit: Payer: Medicaid Other

## 2022-02-06 ENCOUNTER — Ambulatory Visit: Payer: Medicaid Other | Admitting: Physical Therapy

## 2022-02-06 ENCOUNTER — Encounter: Payer: Self-pay | Admitting: Physical Therapy

## 2022-02-06 DIAGNOSIS — M542 Cervicalgia: Secondary | ICD-10-CM

## 2022-02-06 DIAGNOSIS — M47816 Spondylosis without myelopathy or radiculopathy, lumbar region: Secondary | ICD-10-CM

## 2022-02-06 DIAGNOSIS — M5459 Other low back pain: Secondary | ICD-10-CM

## 2022-02-06 DIAGNOSIS — G8929 Other chronic pain: Secondary | ICD-10-CM

## 2022-02-06 DIAGNOSIS — M79605 Pain in left leg: Secondary | ICD-10-CM | POA: Diagnosis not present

## 2022-02-06 DIAGNOSIS — M545 Low back pain, unspecified: Secondary | ICD-10-CM

## 2022-02-06 DIAGNOSIS — M62830 Muscle spasm of back: Secondary | ICD-10-CM

## 2022-02-06 DIAGNOSIS — M6281 Muscle weakness (generalized): Secondary | ICD-10-CM

## 2022-02-06 DIAGNOSIS — M79604 Pain in right leg: Secondary | ICD-10-CM
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
## 2022-02-11 ENCOUNTER — Ambulatory Visit: Payer: Medicaid Other | Admitting: Physical Therapy

## 2022-02-11 DIAGNOSIS — M6281 Muscle weakness (generalized): Secondary | ICD-10-CM

## 2022-02-11 DIAGNOSIS — M5459 Other low back pain: Secondary | ICD-10-CM

## 2022-02-11 DIAGNOSIS — M542 Cervicalgia: Secondary | ICD-10-CM

## 2022-02-11 DIAGNOSIS — M79605 Pain in left leg: Secondary | ICD-10-CM

## 2022-02-11 DIAGNOSIS — M79604 Pain in right leg: Secondary | ICD-10-CM
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
## 2022-02-13 ENCOUNTER — Ambulatory Visit: Payer: Medicaid Other | Admitting: Physical Therapy

## 2022-02-20 ENCOUNTER — Ambulatory Visit: Payer: Medicaid Other | Admitting: Physical Therapy
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
## 2022-03-04 ENCOUNTER — Ambulatory Visit: Payer: Medicaid Other | Attending: Physician Assistant | Admitting: Physical Therapy

## 2022-03-04 DIAGNOSIS — M79605 Pain in left leg: Secondary | ICD-10-CM | POA: Diagnosis present

## 2022-03-04 DIAGNOSIS — M5459 Other low back pain: Secondary | ICD-10-CM | POA: Diagnosis present

## 2022-03-04 DIAGNOSIS — M6281 Muscle weakness (generalized): Secondary | ICD-10-CM | POA: Diagnosis present

## 2022-03-04 DIAGNOSIS — M545 Low back pain, unspecified: Secondary | ICD-10-CM | POA: Insufficient documentation

## 2022-03-04 DIAGNOSIS — M542 Cervicalgia: Secondary | ICD-10-CM | POA: Insufficient documentation

## 2022-03-04 DIAGNOSIS — G8929 Other chronic pain: Secondary | ICD-10-CM | POA: Insufficient documentation

## 2022-03-04 DIAGNOSIS — M79604 Pain in right leg: Secondary | ICD-10-CM | POA: Insufficient documentation

## 2022-03-04 DIAGNOSIS — M62830 Muscle spasm of back: Secondary | ICD-10-CM | POA: Diagnosis present

## 2022-03-04 DIAGNOSIS — M47816 Spondylosis without myelopathy or radiculopathy, lumbar region: Secondary | ICD-10-CM | POA: Insufficient documentation
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
## 2022-03-05 ENCOUNTER — Ambulatory Visit (INDEPENDENT_AMBULATORY_CARE_PROVIDER_SITE_OTHER): Payer: Medicaid Other | Admitting: Rheumatology

## 2022-03-05 ENCOUNTER — Encounter: Payer: Self-pay | Admitting: Rheumatology

## 2022-03-05 VITALS — BP 106/77 | HR 97 | Ht 64.0 in | Wt 202.8 lb

## 2022-03-05 DIAGNOSIS — R768 Other specified abnormal immunological findings in serum: Secondary | ICD-10-CM

## 2022-03-05 DIAGNOSIS — M47816 Spondylosis without myelopathy or radiculopathy, lumbar region: Secondary | ICD-10-CM | POA: Diagnosis not present

## 2022-03-05 DIAGNOSIS — M170 Bilateral primary osteoarthritis of knee: Secondary | ICD-10-CM

## 2022-03-05 DIAGNOSIS — M2550 Pain in unspecified joint: Secondary | ICD-10-CM | POA: Diagnosis not present

## 2022-03-05 DIAGNOSIS — M7918 Myalgia, other site: Secondary | ICD-10-CM

## 2022-03-05 DIAGNOSIS — N946 Dysmenorrhea, unspecified: Secondary | ICD-10-CM

## 2022-03-05 DIAGNOSIS — M19041 Primary osteoarthritis, right hand: Secondary | ICD-10-CM | POA: Diagnosis not present

## 2022-03-05 DIAGNOSIS — M7062 Trochanteric bursitis, left hip: Secondary | ICD-10-CM

## 2022-03-05 DIAGNOSIS — R7303 Prediabetes: Secondary | ICD-10-CM | POA: Diagnosis not present

## 2022-03-05 DIAGNOSIS — M7061 Trochanteric bursitis, right hip: Secondary | ICD-10-CM

## 2022-03-05 DIAGNOSIS — M79641 Pain in right hand: Secondary | ICD-10-CM

## 2022-03-05 DIAGNOSIS — M4126 Other idiopathic scoliosis, lumbar region: Secondary | ICD-10-CM | POA: Diagnosis not present

## 2022-03-05 DIAGNOSIS — M19071 Primary osteoarthritis, right ankle and foot: Secondary | ICD-10-CM

## 2022-03-05 DIAGNOSIS — M19042 Primary osteoarthritis, left hand: Secondary | ICD-10-CM

## 2022-03-05 DIAGNOSIS — M62838 Other muscle spasm: Secondary | ICD-10-CM

## 2022-03-05 DIAGNOSIS — F419 Anxiety disorder, unspecified: Secondary | ICD-10-CM | POA: Diagnosis not present

## 2022-03-05 DIAGNOSIS — M19072 Primary osteoarthritis, left ankle and foot: Secondary | ICD-10-CM

## 2022-03-05 MED ORDER — METHOCARBAMOL 500 MG PO TABS: 500.0000 mg | ORAL_TABLET | Freq: Every evening | ORAL | 0 refills | Status: DC | PRN
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
## 2022-03-20 ENCOUNTER — Encounter: Payer: Self-pay | Admitting: Physical Therapy

## 2022-03-20 ENCOUNTER — Ambulatory Visit: Payer: Medicaid Other | Admitting: Physical Therapy

## 2022-03-20 DIAGNOSIS — M5459 Other low back pain: Secondary | ICD-10-CM

## 2022-03-20 DIAGNOSIS — G8929 Other chronic pain: Secondary | ICD-10-CM

## 2022-03-20 DIAGNOSIS — M62830 Muscle spasm of back: Secondary | ICD-10-CM

## 2022-03-20 DIAGNOSIS — M6281 Muscle weakness (generalized): Secondary | ICD-10-CM

## 2022-03-20 DIAGNOSIS — M47816 Spondylosis without myelopathy or radiculopathy, lumbar region: Secondary | ICD-10-CM

## 2022-03-20 DIAGNOSIS — M79605 Pain in left leg: Secondary | ICD-10-CM

## 2022-03-20 DIAGNOSIS — M542 Cervicalgia: Secondary | ICD-10-CM

## 2022-03-20 DIAGNOSIS — M79604 Pain in right leg: Secondary | ICD-10-CM

## 2022-04-03 ENCOUNTER — Ambulatory Visit: Payer: Medicaid Other | Admitting: Physical Therapy

## 2022-04-08 ENCOUNTER — Ambulatory Visit: Payer: Medicaid Other | Attending: Physician Assistant | Admitting: Physical Therapy

## 2022-04-08 ENCOUNTER — Encounter: Payer: Self-pay | Admitting: Physical Therapy

## 2022-04-08 DIAGNOSIS — M542 Cervicalgia: Secondary | ICD-10-CM | POA: Diagnosis present

## 2022-04-08 DIAGNOSIS — M6281 Muscle weakness (generalized): Secondary | ICD-10-CM | POA: Insufficient documentation

## 2022-04-08 DIAGNOSIS — M62830 Muscle spasm of back: Secondary | ICD-10-CM | POA: Diagnosis present

## 2022-04-08 DIAGNOSIS — M5459 Other low back pain: Secondary | ICD-10-CM | POA: Insufficient documentation

## 2022-04-08 DIAGNOSIS — M47816 Spondylosis without myelopathy or radiculopathy, lumbar region: Secondary | ICD-10-CM | POA: Insufficient documentation

## 2022-04-08 DIAGNOSIS — G8929 Other chronic pain: Secondary | ICD-10-CM | POA: Insufficient documentation

## 2022-04-08 DIAGNOSIS — M79604 Pain in right leg: Secondary | ICD-10-CM | POA: Diagnosis present

## 2022-04-08 DIAGNOSIS — M79605 Pain in left leg: Secondary | ICD-10-CM | POA: Diagnosis present

## 2022-04-08 DIAGNOSIS — M545 Low back pain, unspecified: Secondary | ICD-10-CM | POA: Insufficient documentation
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
## 2022-04-17 ENCOUNTER — Ambulatory Visit: Payer: Medicaid Other | Admitting: Physical Therapy
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
## 2022-04-29 ENCOUNTER — Ambulatory Visit: Payer: Medicaid Other | Attending: Physician Assistant | Admitting: Physical Therapy

## 2022-04-29 DIAGNOSIS — M6281 Muscle weakness (generalized): Secondary | ICD-10-CM | POA: Insufficient documentation

## 2022-04-29 DIAGNOSIS — M79605 Pain in left leg: Secondary | ICD-10-CM | POA: Diagnosis present

## 2022-04-29 DIAGNOSIS — M79604 Pain in right leg: Secondary | ICD-10-CM | POA: Diagnosis present

## 2022-04-29 DIAGNOSIS — M542 Cervicalgia: Secondary | ICD-10-CM | POA: Diagnosis present
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
## 2022-05-05 ENCOUNTER — Other Ambulatory Visit: Payer: Medicaid Other

## 2022-05-05 ENCOUNTER — Ambulatory Visit (INDEPENDENT_AMBULATORY_CARE_PROVIDER_SITE_OTHER): Payer: Medicaid Other | Admitting: *Deleted

## 2022-05-05 VITALS — BP 121/85 | HR 78 | Ht 64.0 in | Wt 207.0 lb

## 2022-05-05 DIAGNOSIS — Z3490 Encounter for supervision of normal pregnancy, unspecified, unspecified trimester: Secondary | ICD-10-CM | POA: Diagnosis not present

## 2022-05-05 DIAGNOSIS — Z3201 Encounter for pregnancy test, result positive: Secondary | ICD-10-CM

## 2022-05-05 LAB — POCT URINE PREGNANCY: Preg Test, Ur: POSITIVE — AB

## 2022-05-05 NOTE — Progress Notes (Signed)
Hannah Fleming presents today for UPT. She has no unusual complaints. LMP: 03/24/22    OBJECTIVE: Appears well, in no apparent distress.  OB History     Gravida  8   Para  3   Term  3   Preterm  0   AB  5   Living  3      SAB  2   IAB  2   Ectopic  0   Multiple  0   Live Births  3          Home UPT Result: positive In-Office UPT result: positive I have reviewed the patient's medical, obstetrical, social, and family histories, and medications.   ASSESSMENT: Positive pregnancy test  PLAN Prenatal care to be completed at: [facility]  Pt had Sonata procedure 10/22/21. Monthly menses since then. Gavin Pound, CNM and Fatima Blank, CNM were consulted. Decision for bHCG today and repeat in 48 hrs. Pt advised. Verbalized understanding.

## 2022-05-06 LAB — BETA HCG QUANT (REF LAB): hCG Quant: 48931 m[IU]/mL

## 2022-05-07 ENCOUNTER — Other Ambulatory Visit: Payer: Medicaid Other

## 2022-05-07 DIAGNOSIS — Z3490 Encounter for supervision of normal pregnancy, unspecified, unspecified trimester: Secondary | ICD-10-CM | POA: Diagnosis not present

## 2022-05-08 ENCOUNTER — Other Ambulatory Visit: Payer: Self-pay | Admitting: *Deleted

## 2022-05-08 DIAGNOSIS — O0991 Supervision of high risk pregnancy, unspecified, first trimester: Secondary | ICD-10-CM

## 2022-05-08 LAB — BETA HCG QUANT (REF LAB): hCG Quant: 73509 m[IU]/mL

## 2022-05-08 MED ORDER — PROMETHAZINE HCL 25 MG PO TABS: 25.0000 mg | ORAL_TABLET | Freq: Four times a day (QID) | ORAL | 1 refills | Status: DC | PRN

## 2022-05-08 NOTE — Progress Notes (Signed)
TC to inform of rising bHG. See progress notes.

## 2022-05-08 NOTE — Progress Notes (Signed)
TC to pt to inform of rising bHCG and confirmed pregnancy. Pt reports small amt of spotting continues, but has not increased. Reports minimal low abd discomfort, not one sided. Advised to keep viability [place] appt 05/26/22. Reports some nausea. RX phenergan per protocol. Advised to seek care in [facility]U for increased bleeding or pain.

## 2022-05-26 ENCOUNTER — Ambulatory Visit (INDEPENDENT_AMBULATORY_CARE_PROVIDER_SITE_OTHER): Payer: Medicaid Other

## 2022-05-26 VITALS — BP 123/86 | HR 93 | Ht 65.0 in | Wt 207.0 lb

## 2022-05-26 DIAGNOSIS — Z3480 Encounter for supervision of other normal pregnancy, unspecified trimester: Secondary | ICD-10-CM

## 2022-05-26 DIAGNOSIS — Z3481 Encounter for supervision of other normal pregnancy, first trimester: Secondary | ICD-10-CM | POA: Diagnosis not present

## 2022-05-26 DIAGNOSIS — Z3A08 8 weeks gestation of pregnancy: Secondary | ICD-10-CM | POA: Diagnosis not present

## 2022-05-26 DIAGNOSIS — O3680X0 Pregnancy with inconclusive fetal viability, not applicable or unspecified: Secondary | ICD-10-CM

## 2022-05-26 NOTE — Progress Notes (Signed)
New OB Intake  I connected with  Hannah Fleming on 05/26/22 at  3:10 PM EDT by in person and verified that I am speaking with the correct person using two identifiers. Nurse is located at [facility] and pt is located at [place].  I discussed the limitations, risks, security and privacy concerns of performing an evaluation and management service by telephone and the availability of in person appointments. I also discussed with the patient that there may be a patient responsible charge related to this service. The patient expressed understanding and agreed to proceed.  I explained I am completing New OB Intake today. We discussed her EDD of 12/29/22 that is based on LMP of 03/24/22. Pt is G9/P3053. I reviewed her allergies, medications, Medical/Surgical/OB history, and appropriate screenings. I informed her of [facility] services. [facility] information placed in AVS. Based on history, this is a/an  pregnancy uncomplicated .   Patient Active Problem List   Diagnosis Date Noted   Fibroids, intramural    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/17/2021   Bacterial vaginitis 11/27/2020   Candida vaginitis 11/27/2020   Prediabetes 06/21/2019   Ear itch 03/30/2019   Tonsillar calculus 03/30/2019   Menorrhagia with regular cycle 12/28/2013   Anxiety 02/07/2013   Dysmenorrhea 02/07/2013    Concerns addressed today  Delivery Plans Plans to deliver at [facility]. Patient given information for [facility] Healthy Baby website for more information about [facility] and [place]. Patient is not interested in water birth. Offered upcoming OB visit with CNM to discuss further.  MyChart/Babyscripts MyChart access verified. I explained pt will have some visits in office and some virtually. Babyscripts instructions given and order placed. Patient verifies receipt of registration text/e-mail. Account successfully created and app downloaded.  Blood Pressure Cuff/Weight Scale Patient has access to a BP cuff at home. Explained after first  prenatal appt pt will check weekly and document in 38. Patient does have weight scale.   Anatomy US Explained first scheduled [place] will be around 19 weeks. Dating and viability scan performed today.  Labs Discussed Johnsie Cancel genetic screening with patient. Would like both Panorama and Horizon drawn at new OB visit. Routine prenatal labs needed.  Covid Vaccine Patient has not covid vaccine.   Is patient a CenteringPregnancy candidate?  Declined Declined due to Group Setting Not a candidate due to  Centering Patient" indicated on sticky note  Social Determinants of Health Food Insecurity: Patient denies food insecurity. WIC Referral: Patient is interested in referral to [facility].  Transportation: Patient denies transportation needs. Childcare: Discussed no children allowed at ultrasound appointments. Offered childcare services; patient declines childcare services at this time.  First visit review I reviewed new OB appt with pt. I explained she will have a provider visit that includes prenatal labs, pap smear, std screening, genetic screening, and discuss plan of care for pregnancy. Explained pt will be seen by Lynnda Shields at first visit; encounter routed to appropriate provider. Explained that patient will be seen by pregnancy navigator following visit with provider.   Lucianne Lei, RN 05/26/2022  3:07 PM

## 2022-06-03 ENCOUNTER — Encounter (HOSPITAL_BASED_OUTPATIENT_CLINIC_OR_DEPARTMENT_OTHER): Payer: Self-pay

## 2022-06-03 ENCOUNTER — Emergency Department (HOSPITAL_BASED_OUTPATIENT_CLINIC_OR_DEPARTMENT_OTHER)
Admission: EM | Admit: 2022-06-03 | Discharge: 2022-06-03 | Disposition: A | Discharge status: Home / Self Care | Payer: Medicaid Other | Source: Home / Self Care | Attending: Emergency Medicine | Admitting: Emergency Medicine

## 2022-06-03 ENCOUNTER — Other Ambulatory Visit: Payer: Self-pay

## 2022-06-03 ENCOUNTER — Encounter (HOSPITAL_COMMUNITY): Payer: Self-pay

## 2022-06-03 ENCOUNTER — Emergency Department (HOSPITAL_COMMUNITY)
Admission: EM | Admit: 2022-06-03 | Discharge: 2022-06-03 | Discharge status: Against Medical Advice | Payer: Medicaid Other | Attending: Emergency Medicine | Admitting: Emergency Medicine

## 2022-06-03 DIAGNOSIS — Z3A10 10 weeks gestation of pregnancy: Secondary | ICD-10-CM | POA: Insufficient documentation

## 2022-06-03 DIAGNOSIS — O26891 Other specified pregnancy related conditions, first trimester: Secondary | ICD-10-CM | POA: Insufficient documentation

## 2022-06-03 DIAGNOSIS — R519 Headache, unspecified: Secondary | ICD-10-CM | POA: Insufficient documentation

## 2022-06-03 DIAGNOSIS — Z91040 Latex allergy status: Secondary | ICD-10-CM | POA: Insufficient documentation

## 2022-06-03 DIAGNOSIS — O9A211 Injury, poisoning and certain other consequences of external causes complicating pregnancy, first trimester: Secondary | ICD-10-CM | POA: Diagnosis not present

## 2022-06-03 DIAGNOSIS — Y92410 Unspecified street and highway as the place of occurrence of the external cause: Secondary | ICD-10-CM | POA: Diagnosis not present

## 2022-06-03 DIAGNOSIS — Z5321 Procedure and treatment not carried out due to patient leaving prior to being seen by health care provider: Secondary | ICD-10-CM | POA: Diagnosis not present

## 2022-06-03 DIAGNOSIS — M25512 Pain in left shoulder: Secondary | ICD-10-CM | POA: Insufficient documentation

## 2022-06-03 DIAGNOSIS — M25519 Pain in unspecified shoulder: Secondary | ICD-10-CM | POA: Diagnosis not present

## 2022-06-03 NOTE — ED Notes (Signed)
Patient brought back to room and voiced need to void.  Patient given urine cup.

## 2022-06-03 NOTE — ED Notes (Signed)
ED Provider at bedside. 
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
## 2022-06-03 NOTE — Discharge Instructions (Signed)
Follow up with your doctor and ob in the office.  Vaginal bleeding, worsening headache confusion, difficulty with breathing return to be evaluated.
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
## 2022-06-03 NOTE — ED Notes (Signed)
Pt notified staff that she is leaving and will follow up with her doctor

## 2022-06-17 ENCOUNTER — Encounter (HOSPITAL_COMMUNITY): Payer: Self-pay | Admitting: Obstetrics & Gynecology

## 2022-06-17 ENCOUNTER — Other Ambulatory Visit: Payer: Self-pay

## 2022-06-17 ENCOUNTER — Inpatient Hospital Stay (HOSPITAL_COMMUNITY)
Admission: AD | Admit: 2022-06-17 | Discharge: 2022-06-17 | Disposition: A | Discharge status: Home / Self Care | Payer: Medicaid Other | Attending: Obstetrics & Gynecology | Admitting: Obstetrics & Gynecology

## 2022-06-17 DIAGNOSIS — Z79899 Other long term (current) drug therapy: Secondary | ICD-10-CM | POA: Diagnosis not present

## 2022-06-17 DIAGNOSIS — Z20822 Contact with and (suspected) exposure to covid-19: Secondary | ICD-10-CM

## 2022-06-17 DIAGNOSIS — O98511 Other viral diseases complicating pregnancy, first trimester: Secondary | ICD-10-CM | POA: Diagnosis not present

## 2022-06-17 DIAGNOSIS — U071 COVID-19: Secondary | ICD-10-CM | POA: Diagnosis not present

## 2022-06-17 DIAGNOSIS — J989 Respiratory disorder, unspecified: Secondary | ICD-10-CM | POA: Diagnosis not present

## 2022-06-17 DIAGNOSIS — O99511 Diseases of the respiratory system complicating pregnancy, first trimester: Secondary | ICD-10-CM | POA: Diagnosis not present

## 2022-06-17 DIAGNOSIS — Z3A12 12 weeks gestation of pregnancy: Secondary | ICD-10-CM | POA: Insufficient documentation

## 2022-06-17 LAB — RESP PANEL BY RT-PCR (FLU A&B, COVID) ARPGX2
Influenza A by PCR: NEGATIVE
Influenza B by PCR: NEGATIVE
SARS Coronavirus 2 by RT PCR: POSITIVE — AB

## 2022-06-17 MED ORDER — BENZONATATE 100 MG PO CAPS: 200.0000 mg | ORAL_CAPSULE | Freq: Three times a day (TID) | ORAL | 0 refills | Status: AC | PRN

## 2022-06-17 NOTE — Discharge Instructions (Signed)

## 2022-06-17 NOTE — MAU Provider Note (Signed)
History     [id number]  Arrival date and time: 06/17/22 1120    Chief Complaint  Patient presents with   Congestion   Cough   Sore Throat     HPI Hannah Fleming is a 41 y.o. at 51w1dwho presents for cold symptoms & COVID exposure. States her boss was recently diagnosed with COVID.  Her symptoms started on Friday. Symptoms include, congestion, sore throat, non productive cough, decreased appetite, & rhinorrhea. Denies fever/chills, SOB, or CP. No abdominal pain or vaginal bleeding. Took tylenol cold yesterday.    OB History     Gravida  9   Para  3   Term  3   Preterm  0   AB  5   Living  3      SAB  1   IAB  3   Ectopic  0   Multiple  0   Live Births  3           Past Medical History:  Diagnosis Date   Fibroids    Genital herpes    no outbreak in 17yr  Trichomonas infection    Urticaria     Past Surgical History:  Procedure Laterality Date   DILATATION & CURETTAGE/HYSTEROSCOPY WITH MYOSURE N/A 10/22/2021   [place]geon: BaGriffin BasilMD;  [facility];  Laterality: N/A;   DILATION AND CURETTAGE OF UTERUS  2020   INDUCED ABORTION     WISDOM TOOTH EXTRACTION      Family History  Problem Relation Age of Onset   Healthy Mother    Sickle cell trait Mother    Healthy Father    Sickle cell trait Sister    Sickle cell trait Brother    Healthy Daughter    Healthy Son    Healthy Son    Other Neg Hx     Allergies  Allergen Reactions   Prilosec Otc [Omeprazole Magnesium] Hives   Latex Itching and Rash    No current facility-administered medications on file prior to encounter.   Current Outpatient Medications on File Prior to Encounter  Medication Sig Dispense Refill   Prenat-Fe Carbonyl-FA-Omega 3 (ONE-A-DAY WOMENS PRENATAL 1) 28-0.8-235 MG CAPS Take 1 capsule by mouth daily.     promethazine (PHENERGAN) 25 MG tablet Take 1  tablet (25 mg total) by mouth every 6 (six) hours as needed for nausea or vomiting. 30 tablet 1     ROS Pertinent positives and negative per HPI, all others reviewed and negative  Physical Exam   BP 129/84 (BP Location: Right Arm)   Pulse 90   Temp 98.1 F (36.7 C) (Oral)   Resp 18   Ht '5\' 4"'$  (1.626 m)   Wt 94.4 kg   LMP 03/24/2022 (Approximate)   SpO2 99%   BMI 35.72 kg/m   Patient Vitals for the past 24 hrs:  BP Temp Temp src Pulse Resp SpO2 Height Weight  06/17/22 1141 129/84 98.1 F (36.7 C) Oral 90 18 99 % -- --  06/17/22 1134 -- -- -- -- -- -- '5\' 4"'$  (1.626 m) 94.4 kg    Physical Exam Vitals and nursing note reviewed.  Constitutional:      General: She is not in acute distress.    Appearance: Normal appearance. She is not ill-appearing or toxic-appearing.  HENT:     Head: Normocephalic and atraumatic.     Right Ear: Tympanic membrane normal.  Left Ear: Tympanic membrane normal.     Nose: Nose normal.     Right Sinus: No maxillary sinus tenderness or frontal sinus tenderness.     Left Sinus: No maxillary sinus tenderness or frontal sinus tenderness.  Eyes:     Conjunctiva/sclera: Conjunctivae normal.  Neck:     Trachea: Trachea normal. No tracheal deviation.  Cardiovascular:     Rate and Rhythm: Normal rate and regular rhythm.     Heart sounds: Normal heart sounds. No murmur heard. Pulmonary:     Effort: Pulmonary effort is normal.     Breath sounds: Normal breath sounds.  Musculoskeletal:     Cervical back: No rigidity.  Lymphadenopathy:     Cervical: No cervical adenopathy.  Skin:    General: Skin is warm and dry.  Neurological:     Mental Status: She is alert.      Labs Results for orders placed or performed during the hospital encounter of 06/17/22 (from the past 24 hour(s))  Resp Panel by RT-PCR (Flu A&B, Covid) Anterior Nasal Swab     Status: Abnormal   Collection Time: 06/17/22 11:49 AM   Specimen: Anterior Nasal Swab  Result Value Ref  Range   SARS Coronavirus 2 by RT PCR POSITIVE (A) NEGATIVE   Influenza A by PCR NEGATIVE NEGATIVE   Influenza B by PCR NEGATIVE NEGATIVE    Imaging No results found.  MAU Course  Procedures Lab Orders         Resp Panel by RT-PCR (Flu A&B, Covid) Anterior Nasal Swab    Meds ordered this encounter  Medications   benzonatate (TESSALON PERLES) 100 MG capsule    Sig: Take 2 capsules (200 mg total) by mouth 3 (three) times daily as needed for cough.    Dispense:  30 capsule    Refill:  0    Order Specific Question:   Supervising Provider    Answer:   Verita Schneiders A [[id number]]   Imaging Orders  No imaging studies ordered today    MDM FHT present via doppler Vital signs stable & patient afebrile  Symptoms likely related to covid since patient has known exposure. Respiratory swab collected.  Discussed symptomatic treatment. She is 5+ days from symptom onset - not candidate for paxlovid.   Assessment and Plan   1. Exposure to confirmed case of COVID-19   2. Respiratory illness   3. [redacted] weeks gestation of pregnancy    -Rx tessalon -given list of OTC meds safe in pregnancy -discussed quarantine & given work excuse note -COVID results returned positive  Jorje Guild, NP 06/17/22 1:33 PM

## 2022-06-17 NOTE — MAU Note (Signed)
Hannah Fleming is a 41 y.o. at 51w1dhere in MAU reporting: she's been "feeling bad" since Friday.  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  Reports has a runny nose, sore throat, congestion, fatigue, cough, and no appetite. Denies VB, endorses minimal lower abdominal cramping.  LMP: N/A Onset of complaint: Friday Pain score: 3 Vitals:   06/17/22 1141  BP: 129/84  Pulse: 90  Resp: 18  Temp: 98.1 F (36.7 C)  SpO2: 99%     FHT:172 bpm Lab orders placed from triage:  Covid

## 2022-07-01 ENCOUNTER — Institutional Professional Consult (permissible substitution): Payer: Self-pay | Admitting: Licensed Clinical Social Worker

## 2022-07-02 ENCOUNTER — Ambulatory Visit (INDEPENDENT_AMBULATORY_CARE_PROVIDER_SITE_OTHER): Payer: Medicaid Other

## 2022-07-02 ENCOUNTER — Ambulatory Visit (INDEPENDENT_AMBULATORY_CARE_PROVIDER_SITE_OTHER): Payer: Medicaid Other | Admitting: Obstetrics

## 2022-07-02 ENCOUNTER — Institutional Professional Consult (permissible substitution): Payer: Medicaid Other | Admitting: Licensed Clinical Social Worker

## 2022-07-02 ENCOUNTER — Encounter: Payer: Self-pay | Admitting: Obstetrics

## 2022-07-02 VITALS — BP 133/87 | HR 83 | Wt 208.1 lb

## 2022-07-02 DIAGNOSIS — Z3A14 14 weeks gestation of pregnancy: Secondary | ICD-10-CM | POA: Diagnosis not present

## 2022-07-02 DIAGNOSIS — O368390 Maternal care for abnormalities of the fetal heart rate or rhythm, unspecified trimester, not applicable or unspecified: Secondary | ICD-10-CM

## 2022-07-02 DIAGNOSIS — D250 Submucous leiomyoma of uterus: Secondary | ICD-10-CM | POA: Diagnosis not present

## 2022-07-02 DIAGNOSIS — O0990 Supervision of high risk pregnancy, unspecified, unspecified trimester: Secondary | ICD-10-CM | POA: Diagnosis not present

## 2022-07-02 DIAGNOSIS — O0992 Supervision of high risk pregnancy, unspecified, second trimester: Secondary | ICD-10-CM

## 2022-07-02 DIAGNOSIS — O09529 Supervision of elderly multigravida, unspecified trimester: Secondary | ICD-10-CM | POA: Diagnosis not present

## 2022-07-02 NOTE — Progress Notes (Signed)
Pt presents for NOB visit.  U/S completed at [facility] on 06/24/22 Negative pap 03/21/18 Unable to assess FHT;s; provider notified

## 2022-07-02 NOTE — Progress Notes (Signed)
Subjective:    Hannah Fleming is being seen today for her first obstetrical visit.  This is not a planned pregnancy. She is at 45w2dgestation. Her obstetrical history is significant for advanced maternal age and obesity. Relationship with FOB: significant other, not living together. Patient does intend to breast feed. Pregnancy history fully reviewed.  The information documented in the HPI was reviewed and verified.  Menstrual History: OB History     Gravida  9   Para  3   Term  3   Preterm  0   AB  5   Living  3      SAB  1   IAB  3   Ectopic  0   Multiple  0   Live Births  3            Patient's last menstrual period was 03/24/2022 (approximate).    Past Medical History:  Diagnosis Date   Fibroids    Genital herpes    no outbreak in 89yr  Trichomonas infection    Urticaria     Past Surgical History:  Procedure Laterality Date   DILATATION & CURETTAGE/HYSTEROSCOPY WITH MYOSURE N/A 10/22/2021   [place]cedure[place] Surgeon: BaGriffin BasilMD;  [facility];  Laterality: N/A;   DILATION AND CURETTAGE OF UTERUS  2020   INDUCED ABORTION     WISDOM TOOTH EXTRACTION      (Not in a hospital admission)  Allergies  Allergen Reactions   Prilosec Otc [Omeprazole Magnesium] Hives   Latex Itching and Rash    Social History   Tobacco Use   Smoking status: Former    Packs/day: 1.00    Years: 4.00    Total pack years: 4.00    Types: Cigarettes    Quit date: 02/07/2009    Years since quitting: 13.4    Passive exposure: Never   Smokeless tobacco: Never  Substance Use Topics   Alcohol use: Not Currently    Comment: occasional, not since confirmed pregnancy    Family History  Problem Relation Age of Onset   Healthy Mother    Sickle cell trait Mother    Healthy Father    Sickle cell trait Sister    Sickle cell trait Brother    Healthy Daughter    Healthy  Son    Healthy Son    Other Neg Hx      Review of Systems Constitutional: negative for weight loss Gastrointestinal: negative for vomiting Genitourinary:negative for genital lesions and vaginal discharge and dysuria Musculoskeletal:negative for back pain Behavioral/Psych: negative for abusive relationship, depression, illegal drug usage and tobacco use    Objective:    BP 133/87   Pulse 83   Wt 208 lb 1.6 oz (94.4 kg)   LMP 03/24/2022 (Approximate)   BMI 35.72 kg/m  General Appearance:    Alert, cooperative, no distress, appears stated age  Head:    Normocephalic, without obvious abnormality, atraumatic  Eyes:    PERRL, conjunctiva/corneas clear, EOM's intact, fundi    benign, both eyes  Ears:    Normal TM's and external ear canals, both ears  Nose:   Nares normal, septum midline, mucosa normal, no drainage    or sinus tenderness  Throat:   Lips, mucosa, and tongue normal; teeth and gums normal  Neck:   Supple, symmetrical, trachea midline, no adenopathy;    thyroid:  no enlargement/tenderness/nodules; no carotid   bruit  or JVD  Back:     Symmetric, no curvature, ROM normal, no CVA tenderness  Lungs:     Clear to auscultation bilaterally, respirations unlabored  Chest Wall:    No tenderness or deformity   Heart:    Regular rate and rhythm, S1 and S2 normal, no murmur, rub   or gallop  Breast Exam:    No tenderness, masses, or nipple abnormality  Abdomen:     Soft, non-tender, bowel sounds active all four quadrants,    no masses, no organomegaly  Genitalia:    Normal female without lesion, discharge or tenderness  Extremities:   Extremities normal, atraumatic, no cyanosis or edema  Pulses:   2+ and symmetric all extremities  Skin:   Skin color, texture, turgor normal, no rashes or lesions  Lymph nodes:   Cervical, supraclavicular, and axillary nodes normal  Neurologic:   CNII-XII intact, normal strength, sensation and reflexes    throughout      Lab Review Urine  pregnancy test Labs reviewed yes Radiologic studies reviewed yes   Assessment:    Pregnancy at 18w2dweeks by 12 week viable ultrasound  IUFD  Plan:    Discussed options .  Patient wants to think about options for termination. She will follow up in office in 1 week.  Miscarriage precautions given.  Prenatal vitamins.  Counseling provided regarding continued use of seat belts, cessation of alcohol consumption, smoking or use of illicit drugs; infection precautions i.e., influenza/TDAP immunizations, toxoplasmosis,CMV, parvovirus, listeria and varicella; workplace safety, exercise during pregnancy; routine dental care, safe medications, sexual activity, hot tubs, saunas, pools, travel, caffeine use, fish and methlymercury, potential toxins, hair treatments, varicose veins Weight gain recommendations per IOM guidelines reviewed: underweight/BMI< 18.5--> gain 28 - 40 lbs; normal weight/BMI 18.5 - 24.9--> gain 25 - 35 lbs; overweight/BMI 25 - 29.9--> gain 15 - 25 lbs; obese/BMI >30->gain  11 - 20 lbs Problem list reviewed and updated. FIRST/CF mutation testing/NIPT/QUAD SCREEN/fragile X/Ashkenazi Jewish population testing/Spinal muscular atrophy discussed: requested. Role of ultrasound in pregnancy discussed; fetal survey: requested. Amniocentesis discussed:  undecided  .   Orders Placed This Encounter  Procedures   Culture, OB Urine   [place]MFM OB DETAIL +14 WK    Standing Status:   Future    Standing Expiration Date:   07/03/2023    Order Specific Question:   Reason for Exam (SYMPTOM  OR DIAGNOSIS REQUIRED)    Answer:   High Risk Pregnancy.  AMA.    Order Specific Question:   Preferred Location    Answer:   [facility] Ultrasound   CBC/D/Plt+RPR+Rh+ABO+RubIgG...   Panorama Prenatal Test Full Panel    ==========Department Information========== ID: [id number]:[facility]Dept: [phone] Fax: [phone]    Order Specific Question:   Expected due date (MM/DD/YYYY):    Answer:   12/29/2022    Order Specific Question:   Is this a twin pregnancy? (viable, no vanished twin)    Answer:   No    Order Specific Question:   Is this a surrogate or egg donor pregnancy?    Answer:   No    Order Specific Question:   I want fetal sex included in the report:    Answer:   Yes    Order Specific Question:   Maternal Weight (lbs):  Answer:   208    Order Specific Question:   Which Microdeletion Panel should be ordered?    Answer:   22q11.2 Deletion    Order Specific Question:   Enroll this patient in the Automatic Redraw Program?    Answer:   No    Order Specific Question:   What type of billing?    Answer:   Programme researcher, broadcasting/film/video Question:   By placing this electronic order I confirm the testing ordered herein is medically necessary and this patient has been informed of the details of the genetic test(s) ordered, including the risks, benefits, and alternatives, and has consented to testing.    Answer:   Yes    Order Specific Question:   Select an order diagnosis: For additional options refer to [email or web address]    Answer:   Supervision of elderly multigravida in second trimester [[id number]]   HORIZON Custom    ==========Department Information========== ID: [id number] [facility] [address] [place] [address] Dept: [phone] Dept Fax: [phone]     Order Specific Question:   Specify the name or ID of a valid Horizon Custom Panel:    Answer:   HBASIC    Order Specific Question:   Is patient pregnant?    Answer:   Yes    Order Specific Question:   Ethnicity of patient:    Answer:   African American    Order Specific Question:   Patient authorizes Johnsie Cancel to share patient's Horizon test  results with partner and their medical provider?    Answer:   No    Order Specific Question:   Practice ensures that HIPAA consent is obtained and will make available to [facility] upon request?    Answer:   Yes    Order Specific Question:   By placing this electronic order I confirm the testing ordered herein is medically necessary and this patient has been informed of the details of the genetic test(s) ordered, including the risks, benefits, and alternatives, and has consented to testing.    Answer:   Yes    Order Specific Question:   What type of billing?    Answer:   Programme researcher, broadcasting/film/video Question:   Select an order diagnosis: For additional options refer to [email or web address]    Answer:   Encounter for supervision of other normal pregnancy in second trimester [[id number]]    Order Specific Question:   Tay-Sachs add-on test?    Answer:   No    Follow up in 1 week  I have spent a total of 30 minutes of face-to-face time, excluding clinical staff time, reviewing notes and preparing to see patient, ordering tests and/or medications, and counseling the patient.   Shelly Bombard, MD 07/02/2022 3:51 PM

## 2022-07-04 LAB — URINE CULTURE, OB REFLEX: Organism ID, Bacteria: NO GROWTH

## 2022-07-04 LAB — CULTURE, OB URINE

## 2022-07-07 ENCOUNTER — Other Ambulatory Visit: Payer: Self-pay | Admitting: Obstetrics and Gynecology

## 2022-07-07 ENCOUNTER — Ambulatory Visit (INDEPENDENT_AMBULATORY_CARE_PROVIDER_SITE_OTHER): Payer: Medicaid Other | Admitting: Obstetrics and Gynecology

## 2022-07-07 ENCOUNTER — Ambulatory Visit (INDEPENDENT_AMBULATORY_CARE_PROVIDER_SITE_OTHER): Payer: Medicaid Other | Admitting: Licensed Clinical Social Worker

## 2022-07-07 ENCOUNTER — Encounter: Payer: Self-pay | Admitting: Obstetrics and Gynecology

## 2022-07-07 ENCOUNTER — Telehealth: Payer: Self-pay

## 2022-07-07 VITALS — BP 123/86 | HR 85 | Ht 64.0 in | Wt 205.5 lb

## 2022-07-07 DIAGNOSIS — Z7189 Other specified counseling: Secondary | ICD-10-CM | POA: Diagnosis not present

## 2022-07-07 DIAGNOSIS — O021 Missed abortion: Secondary | ICD-10-CM

## 2022-07-07 DIAGNOSIS — F4321 Adjustment disorder with depressed mood: Secondary | ICD-10-CM | POA: Diagnosis not present

## 2022-07-07 NOTE — Progress Notes (Signed)
Patient presents for follow up to discuss option for missed AB. Patient states that she prefers to have a D&E and desires to have genetic testing.

## 2022-07-07 NOTE — Progress Notes (Signed)
  CC: D and E counseling Subjective:    Patient ID: Hannah Fleming, female    DOB: [birth year], 41 y.o.   MRN: [id number]  HPI 41 yo [id number] seen for discussion of options of treatment for 12 week missed miscarriage.  She has had light spotting and minimal cramping.  Fetal demise verified on ultrasound on 07/02/22.  Pt desires dilation and evacuation with ANORA testing.  Procedure discussed in detail.  Risks and benefits given including bleeding, infection, involvement of other organs as well as uterine perforation.  Possible increased of bleeding during the procedure also discussed.   Review of Systems     Objective:   Physical Exam Vitals:   07/07/22 0913  BP: 123/86  Pulse: 85         Assessment & Plan:   1. Missed abortion Pt desires suction D and E with ANORA testing +/- u/s guidance per provider discretion.  2. Surgical counseling visit   I spent 20 minutes dedicated to the care of this patient including previsit review of records, face to face time with the patient discussing treatment options, risk/benefits and post visit testing.   Information sent to surgical scheduler.    [name], MD Faculty Attending, Center for [facility]

## 2022-07-07 NOTE — Telephone Encounter (Signed)
Called patient, surgery date, time, location and preop instructions given. Patient expressed understanding. 

## 2022-07-07 NOTE — H&P (Signed)
Hannah Fleming is an 41 y.o. female P60 diagnosed with a 12 week missed abortion here for surgical evacuation. Patient was diagnosed by ultrasound finding with missed ab on 07/02/22. She reports light vaginal spotting and occasional cramping pain. Patient is without any other complaints. She denies genetic fetal testing   Menstrual History: Patient's last menstrual period was 03/24/2022 (approximate).    Past Medical History:  Diagnosis Date   Fibroids    Genital herpes    no outbreak in 40yr   Trichomonas infection    Urticaria     Past Surgical History:  Procedure Laterality Date   DILATATION & CURETTAGE/HYSTEROSCOPY WITH MYOSURE N/A 10/22/2021   [place]  Surgeon: BGriffin Basil MD;  [facility]  [facility]: [facility];  Laterality: N/A;   DILATION AND CURETTAGE OF UTERUS  2020   INDUCED ABORTION     WISDOM TOOTH EXTRACTION      Family History  Problem Relation Age of Onset   Healthy Mother    Sickle cell trait Mother    Healthy Father    Sickle cell trait Sister    Sickle cell trait Brother    Healthy Daughter    Healthy Son    Healthy Son    Other Neg Hx     Social History:  reports that she quit smoking about 13 years ago. Her smoking use included cigarettes. She has a 4.00 pack-year smoking history. She has never been exposed to tobacco smoke. She has never used smokeless tobacco. She reports that she does not currently use alcohol. She reports that she does not use drugs.  Allergies:  Allergies  Allergen Reactions   Prilosec Otc [Omeprazole Magnesium] Hives   Latex Itching and Rash    No medications prior to admission.    Review of Systems See pertinent in HPI. All other systems reviewed and non contributory Last menstrual period 03/24/2022. Physical Exam GENERAL: Well-developed, well-nourished female in no acute distress.  LUNGS: Clear to auscultation bilaterally.   HEART: Regular rate and rhythm. ABDOMEN: Soft, nontender, nondistended. No organomegaly. PELVIC: Deferred to OR EXTREMITIES: No cyanosis, clubbing, or edema, 2+ distal pulses.  No results found for this or any previous visit (from the past 24 hour(s)).  No results found.  Assessment/Plan: 41yo with missed abortion measuring 12wks here for dilatation and evacuation - Riss, benefits and alternatives were explained to the patient including but not limited to risks of bleeding, infection, uterine perforation and damage to adjacent organs. Patient verbalized understanding and all questions were answered - Anora genetic testing will be collected  Hannah Fleming 07/07/2022, 12:26 PM

## 2022-07-08 ENCOUNTER — Encounter (HOSPITAL_COMMUNITY): Payer: Self-pay | Admitting: Obstetrics and Gynecology

## 2022-07-08 NOTE — Progress Notes (Signed)
PCP - Dr Rosario Jacks Cardiologist - n/a  Chest x-ray - 10/05/21 (1V) EKG - n/a Stress Test - n/a ECHO - n/a Cardiac Cath - n/a  ICD Pacemaker/Loop - n/a  Sleep Study -  n/a CPAP - none  ERAS: Clear liquids til 6 am DOS.  STOP now taking any Aspirin (unless otherwise instructed by your surgeon), Aleve, Naproxen, Ibuprofen, Motrin, Advil, Goody's, BC's, all herbal medications, fish oil, and all vitamins.   Coronavirus Screening Do you have any of the following symptoms:  Cough yes/no: No Fever (>100.66F)  yes/no: No Runny nose yes/no: No Sore throat yes/no: No Difficulty breathing/shortness of breath  yes/no: No  Have you traveled in the last 14 days and where? yes/no: No  Patient verbalized understanding of instructions that were given via phone.

## 2022-07-08 NOTE — BH Specialist Note (Signed)
Integrated Behavioral Health Initial In-Person Visit  MRN: [id number] Name: Hannah Fleming  Number of [facility] Clinician visits: 1 Session Start time:   10:30am Session End time: 11:00am Total time in minutes: 30 min in person at [facility]  Types of Service: Individual psychotherapy  Interpretor:No. Interpretor Name and Language: none   Warm Hand Off Completed.        Subjective: NICHOLE NEYER is a 41 y.o. female accompanied by  oldest daughter Patient was referred by Dr. Elgie Congo for pregnancy loss. Patient reports the following symptoms/concerns: crying, depressed mood, anger, argumentative behaviors Duration of problem: approx one month; Severity of problem: mild  Objective: Mood: Sad and Affect: Appropriate Risk of harm to self or others: No plan to harm self or others  Life Context: Family and Social: Lives in [place]  School/Work: employed  Self-Care: Spending time with daughter and friends  Life Changes: Pregnancy loss   Patient and/or Family's Strengths/Protective Factors: Concrete supports in place (healthy food, safe environments, etc.)  Goals Addressed: Patient will: Reduce symptoms of: mood instability and stress Increase knowledge and/or ability of: coping skills and stress reduction  Demonstrate ability to: Increase adequate support systems for patient/family and Begin healthy grieving over loss  Progress towards Goals: Ongoing  Interventions: Interventions utilized: Motivational Interviewing and Supportive Counseling  Standardized Assessments completed: PHQ 9  Patient and/or Family Response: Ms. Glaab reports sad mood since pregnancy loss. Ms. Roa reports her children and close friends are a great support system.    Assessment: Patient currently grieving pregnancy loss. Ms. Brusseau was given information for community mental health providers.    Patient may benefit from community mental health.  Plan: Follow up with  behavioral health clinician on : as needed Behavioral recommendations: Memorialize pregnancy loss, use patience with grieving process, communicate needs with supportive family and friends Referral(s): [facility] (LME/Outside Clinic) "From scale of 1-10, how likely are you to follow plan?":    Lynnea Ferrier, LCSW

## 2022-07-09 ENCOUNTER — Ambulatory Visit (HOSPITAL_COMMUNITY): Payer: Medicaid Other | Admitting: Certified Registered Nurse Anesthetist

## 2022-07-09 ENCOUNTER — Ambulatory Visit (HOSPITAL_COMMUNITY)
Admission: RE | Admit: 2022-07-09 | Discharge: 2022-07-09 | Disposition: A | Discharge status: Home / Self Care | Payer: Medicaid Other | Attending: Obstetrics and Gynecology | Admitting: Obstetrics and Gynecology

## 2022-07-09 ENCOUNTER — Encounter (HOSPITAL_COMMUNITY): Payer: Self-pay | Admitting: Obstetrics and Gynecology

## 2022-07-09 ENCOUNTER — Encounter (HOSPITAL_COMMUNITY)
Admission: RE | Disposition: A | Discharge status: Home / Self Care | Payer: Self-pay | Source: Home / Self Care | Attending: Obstetrics and Gynecology

## 2022-07-09 ENCOUNTER — Other Ambulatory Visit: Payer: Self-pay

## 2022-07-09 ENCOUNTER — Ambulatory Visit (HOSPITAL_BASED_OUTPATIENT_CLINIC_OR_DEPARTMENT_OTHER): Payer: Medicaid Other | Admitting: Certified Registered Nurse Anesthetist

## 2022-07-09 DIAGNOSIS — O021 Missed abortion: Secondary | ICD-10-CM

## 2022-07-09 DIAGNOSIS — Z87891 Personal history of nicotine dependence: Secondary | ICD-10-CM | POA: Diagnosis not present

## 2022-07-09 HISTORY — PX: DILATION AND EVACUATION: SHX1459

## 2022-07-09 HISTORY — DX: Unspecified osteoarthritis, unspecified site: M19.90

## 2022-07-09 LAB — CBC
HCT: 38 % (ref 36.0–46.0)
Hemoglobin: 12.3 g/dL (ref 12.0–15.0)
MCH: 29 pg (ref 26.0–34.0)
MCHC: 32.4 g/dL (ref 30.0–36.0)
MCV: 89.6 fL (ref 80.0–100.0)
Platelets: 263 10*3/uL (ref 150–400)
RBC: 4.24 MIL/uL (ref 3.87–5.11)
RDW: 13.2 % (ref 11.5–15.5)
WBC: 8.7 10*3/uL (ref 4.0–10.5)
nRBC: 0 % (ref 0.0–0.2)

## 2022-07-09 LAB — TYPE AND SCREEN
ABO/RH(D): B NEG
Antibody Screen: NEGATIVE

## 2022-07-09 SURGERY — DILATION AND EVACUATION, UTERUS
Anesthesia: General | Site: Vagina

## 2022-07-09 MED ORDER — POVIDONE-IODINE 10 % EX SWAB: 2.0000 | Freq: Once | CUTANEOUS | Status: DC

## 2022-07-09 MED ORDER — MIDAZOLAM HCL 2 MG/2ML IJ SOLN
INTRAMUSCULAR | Status: AC
  Filled 2022-07-09: qty 2

## 2022-07-09 MED ORDER — DEXAMETHASONE SODIUM PHOSPHATE 10 MG/ML IJ SOLN
INTRAMUSCULAR | Status: DC | PRN
  Administered 2022-07-09: 10 mg via INTRAVENOUS

## 2022-07-09 MED ORDER — MISOPROSTOL 100 MCG PO TABS
ORAL_TABLET | ORAL | Status: DC | PRN
  Administered 2022-07-09: 1000 ug

## 2022-07-09 MED ORDER — RHO D IMMUNE GLOBULIN 1500 UNIT/2ML IJ SOSY
300.0000 ug | PREFILLED_SYRINGE | Freq: Once | INTRAMUSCULAR | Status: AC
  Administered 2022-07-09: 300 ug via INTRAMUSCULAR
  Filled 2022-07-09: qty 2

## 2022-07-09 MED ORDER — LACTATED RINGERS IV SOLN: INTRAVENOUS | Status: DC

## 2022-07-09 MED ORDER — ACETAMINOPHEN 10 MG/ML IV SOLN: 1000.0000 mg | Freq: Once | INTRAVENOUS | Status: DC | PRN

## 2022-07-09 MED ORDER — OXYCODONE HCL 5 MG PO TABS: 5.0000 mg | ORAL_TABLET | Freq: Once | ORAL | Status: DC | PRN

## 2022-07-09 MED ORDER — ACETAMINOPHEN 325 MG PO TABS: 325.0000 mg | ORAL_TABLET | ORAL | Status: DC | PRN

## 2022-07-09 MED ORDER — DEXAMETHASONE SODIUM PHOSPHATE 10 MG/ML IJ SOLN
INTRAMUSCULAR | Status: AC
  Filled 2022-07-09: qty 1

## 2022-07-09 MED ORDER — KETOROLAC TROMETHAMINE 30 MG/ML IJ SOLN
INTRAMUSCULAR | Status: DC | PRN
  Administered 2022-07-09: 30 mg via INTRAVENOUS

## 2022-07-09 MED ORDER — ACETAMINOPHEN 160 MG/5ML PO SOLN: 325.0000 mg | ORAL | Status: DC | PRN

## 2022-07-09 MED ORDER — DOXYCYCLINE HYCLATE 100 MG IV SOLR
200.0000 mg | INTRAVENOUS | Status: AC
  Administered 2022-07-09: 200 mg via INTRAVENOUS
  Filled 2022-07-09: qty 200

## 2022-07-09 MED ORDER — CHLOROPROCAINE HCL 1 % IJ SOLN
INTRAMUSCULAR | Status: DC | PRN
  Administered 2022-07-09: 10 mL

## 2022-07-09 MED ORDER — ONDANSETRON HCL 4 MG/2ML IJ SOLN: 4.0000 mg | Freq: Once | INTRAMUSCULAR | Status: DC | PRN

## 2022-07-09 MED ORDER — ONDANSETRON HCL 4 MG/2ML IJ SOLN
INTRAMUSCULAR | Status: AC
  Filled 2022-07-09: qty 2

## 2022-07-09 MED ORDER — CHLOROPROCAINE HCL 1 % IJ SOLN
INTRAMUSCULAR | Status: AC
  Filled 2022-07-09: qty 30

## 2022-07-09 MED ORDER — CHLORHEXIDINE GLUCONATE 0.12 % MT SOLN: 15.0000 mL | OROMUCOSAL | Status: AC

## 2022-07-09 MED ORDER — LIDOCAINE 2% (20 MG/ML) 5 ML SYRINGE
INTRAMUSCULAR | Status: AC
  Filled 2022-07-09: qty 5

## 2022-07-09 MED ORDER — PROPOFOL 10 MG/ML IV BOLUS
INTRAVENOUS | Status: DC | PRN
  Administered 2022-07-09: 150 mg via INTRAVENOUS
  Administered 2022-07-09: 50 mg via INTRAVENOUS

## 2022-07-09 MED ORDER — FENTANYL CITRATE (PF) 250 MCG/5ML IJ SOLN
INTRAMUSCULAR | Status: AC
  Filled 2022-07-09: qty 5

## 2022-07-09 MED ORDER — IBUPROFEN 600 MG PO TABS: 600.0000 mg | ORAL_TABLET | Freq: Four times a day (QID) | ORAL | 3 refills | Status: DC | PRN

## 2022-07-09 MED ORDER — ONDANSETRON HCL 4 MG/2ML IJ SOLN
INTRAMUSCULAR | Status: DC | PRN
  Administered 2022-07-09: 4 mg via INTRAVENOUS

## 2022-07-09 MED ORDER — OXYCODONE HCL 5 MG/5ML PO SOLN: 5.0000 mg | Freq: Once | ORAL | Status: DC | PRN

## 2022-07-09 MED ORDER — MIDAZOLAM HCL 5 MG/5ML IJ SOLN
INTRAMUSCULAR | Status: DC | PRN
  Administered 2022-07-09: 2 mg via INTRAVENOUS

## 2022-07-09 MED ORDER — FENTANYL CITRATE (PF) 100 MCG/2ML IJ SOLN
25.0000 ug | INTRAMUSCULAR | Status: DC | PRN
  Administered 2022-07-09: 25 ug via INTRAVENOUS

## 2022-07-09 MED ORDER — PHENYLEPHRINE 80 MCG/ML (10ML) SYRINGE FOR IV PUSH (FOR BLOOD PRESSURE SUPPORT)
PREFILLED_SYRINGE | INTRAVENOUS | Status: DC | PRN
  Administered 2022-07-09: 160 ug via INTRAVENOUS

## 2022-07-09 MED ORDER — FENTANYL CITRATE (PF) 100 MCG/2ML IJ SOLN
INTRAMUSCULAR | Status: DC | PRN
  Administered 2022-07-09: 50 ug via INTRAVENOUS

## 2022-07-09 MED ORDER — OXYCODONE-ACETAMINOPHEN 5-325 MG PO TABS: 1.0000 | ORAL_TABLET | Freq: Four times a day (QID) | ORAL | 0 refills | Status: DC | PRN

## 2022-07-09 MED ORDER — FENTANYL CITRATE (PF) 100 MCG/2ML IJ SOLN
INTRAMUSCULAR | Status: AC
  Filled 2022-07-09: qty 2

## 2022-07-09 MED ORDER — LIDOCAINE 2% (20 MG/ML) 5 ML SYRINGE
INTRAMUSCULAR | Status: DC | PRN
  Administered 2022-07-09: 100 mg via INTRAVENOUS

## 2022-07-09 MED ORDER — MISOPROSTOL 200 MCG PO TABS
ORAL_TABLET | ORAL | Status: AC
  Filled 2022-07-09: qty 5

## 2022-07-09 MED ORDER — MEPERIDINE HCL 25 MG/ML IJ SOLN: 6.2500 mg | INTRAMUSCULAR | Status: DC | PRN

## 2022-07-09 MED ORDER — 0.9 % SODIUM CHLORIDE (POUR BTL) OPTIME
TOPICAL | Status: DC | PRN
  Administered 2022-07-09: 1000 mL

## 2022-07-09 MED ORDER — CHLORHEXIDINE GLUCONATE 0.12 % MT SOLN
OROMUCOSAL | Status: AC
  Administered 2022-07-09: 15 mL via OROMUCOSAL
  Filled 2022-07-09: qty 15

## 2022-07-09 SURGICAL SUPPLY — 22 items
FILTER UTR ASPR ASSEMBLY (MISCELLANEOUS) ×1 IMPLANT
GLOVE BIOGEL PI IND STRL 6.5 (GLOVE) ×1 IMPLANT
GLOVE BIOGEL PI IND STRL 7.0 (GLOVE) ×1 IMPLANT
GLOVE SURG SS PI 6.5 STRL IVOR (GLOVE) ×1 IMPLANT
GOWN STRL REUS W/ TWL LRG LVL3 (GOWN DISPOSABLE) ×2 IMPLANT
GOWN STRL REUS W/TWL LRG LVL3 (GOWN DISPOSABLE) ×2
HOSE CONNECTING 18IN BERKELEY (TUBING) ×1 IMPLANT
KIT BERKELEY 1ST TRI 3/8 NO TR (MISCELLANEOUS) ×1 IMPLANT
KIT BERKELEY 1ST TRIMESTER 3/8 (MISCELLANEOUS) ×1 IMPLANT
NS IRRIG 1000ML POUR BTL (IV SOLUTION) ×1 IMPLANT
PACK VAGINAL MINOR WOMEN LF (CUSTOM PROCEDURE TRAY) ×1 IMPLANT
PAD OB MATERNITY 4.3X12.25 (PERSONAL CARE ITEMS) ×1 IMPLANT
SET BERKELEY SUCTION TUBING (SUCTIONS) ×1 IMPLANT
SPIKE FLUID TRANSFER (MISCELLANEOUS) ×1 IMPLANT
TOWEL GREEN STERILE FF (TOWEL DISPOSABLE) ×2 IMPLANT
UNDERPAD 30X36 HEAVY ABSORB (UNDERPADS AND DIAPERS) ×1 IMPLANT
VACURETTE 10 RIGID CVD (CANNULA) IMPLANT
VACURETTE 12 RIGID CVD (CANNULA) IMPLANT
VACURETTE 6 ASPIR F TIP BERK (CANNULA) IMPLANT
VACURETTE 7MM CVD STRL WRAP (CANNULA) IMPLANT
VACURETTE 8 RIGID CVD (CANNULA) IMPLANT
VACURETTE 9 RIGID CVD (CANNULA) IMPLANT

## 2022-07-09 NOTE — Transfer of Care (Signed)
Immediate Anesthesia Transfer of Care Note  Patient: Hannah Fleming  Procedure(s) Performed: DILATATION AND EVACUATION (Vagina )  Patient Location: PACU  Anesthesia Type:General  Level of Consciousness: awake, alert , and oriented  Airway & Oxygen Therapy: Patient Spontanous Breathing and Patient connected to face mask oxygen  Post-op Assessment: Report given to RN and Post -op Vital signs reviewed and stable  Post vital signs: Reviewed and stable  Last Vitals:  Vitals Value Taken Time  BP 124/84 07/09/22 0939  Temp    Pulse 85 07/09/22 0940  Resp 18 07/09/22 0940  SpO2 99 % 07/09/22 0940  Vitals shown include unvalidated device data.  Last Pain:  Vitals:   07/09/22 0719  TempSrc: Oral         Complications: No notable events documented.

## 2022-07-09 NOTE — Op Note (Signed)
Hannah Fleming PROCEDURE DATE: 07/09/2022  PREOPERATIVE DIAGNOSIS: 12 week missed abortion. POSTOPERATIVE DIAGNOSIS: The same. PROCEDURE:     Dilation and Evacuation. SURGEON:  Dr. Elly Modena  INDICATIONS: 41 y.o. [id number] with MAB at [redacted] weeks gestation, needing surgical completion.  Risks of surgery were discussed with the patient including but not limited to: bleeding which may require transfusion; infection which may require antibiotics; injury to uterus or surrounding organs;need for additional procedures including laparotomy or laparoscopy; possibility of intrauterine scarring which may impair future fertility; and other postoperative/anesthesia complications. Written informed consent was obtained.    FINDINGS:  A 12-week size anteverted uterus, moderate amounts of products of conception, specimen sent to pathology.  ANESTHESIA:    Monitored intravenous sedation, paracervical block. INTRAVENOUS FLUIDS:  500 ml of LR ESTIMATED BLOOD LOSS:  25 ml. SPECIMENS:  Products of conception sent to pathology COMPLICATIONS:  None immediate.  PROCEDURE DETAILS:  The patient received intravenous antibiotics while in the preoperative area.  She was then taken to the operating room where general anesthesia was administered and was found to be adequate.  After an adequate timeout was performed, she was placed in the dorsal lithotomy position and examined; then prepped and draped in the sterile manner.   Her bladder was catheterized for an unmeasured amount of clear, yellow urine. A vaginal speculum was then placed in the patient's vagina and a single tooth tenaculum was applied to the anterior lip of the cervix.  A paracervical block using 1% Nesicaine was administered. The cervix was gently dilated to accommodate a 12 mm suction curette that was gently advanced to the uterine fundus.  The suction device was then activated and curette slowly rotated to clear the uterus of products of conception.  A sharp  curettage was then performed to confirm complete emptying of the uterus. There was minimal bleeding noted and the tenaculum removed with good hemostasis noted.   All instruments were removed from the patient's vagina. The patient tolerated the procedure well and was taken to the recovery area awake, and in stable condition.  The patient will be discharged to home as per PACU criteria.  Routine postoperative instructions given. She will follow up in the clinic in 2 weeks for postoperative evaluation.

## 2022-07-09 NOTE — Anesthesia Postprocedure Evaluation (Signed)
Anesthesia Post Note  Patient: Hannah Fleming  Procedure(s) Performed: DILATATION AND EVACUATION (Vagina )     Patient location during evaluation: PACU Anesthesia Type: General Level of consciousness: awake Pain management: pain level controlled Vital Signs Assessment: post-procedure vital signs reviewed and stable Respiratory status: spontaneous breathing Cardiovascular status: stable Postop Assessment: no apparent nausea or vomiting Anesthetic complications: no  No notable events documented.  Last Vitals:  Vitals:   07/09/22 1025 07/09/22 1045  BP: 128/86 119/77  Pulse: 67 68  Resp: 17 20  Temp: 36.9 C   SpO2: 98% 97%    Last Pain:  Vitals:   07/09/22 0951  TempSrc:   PainSc: [place]

## 2022-07-09 NOTE — Anesthesia Procedure Notes (Signed)
Procedure Name: LMA Insertion Date/Time: 07/09/2022 9:07 AM  Performed by: Genelle Bal, CRNAPre-anesthesia Checklist: Patient identified, Emergency Drugs available, Suction available and Patient being monitored Patient Re-evaluated:Patient Re-evaluated prior to induction Oxygen Delivery Method: Circle system utilized Preoxygenation: Pre-oxygenation with 100% oxygen Induction Type: IV induction Ventilation: Mask ventilation without difficulty LMA: LMA inserted LMA Size: 4.0 Number of attempts: 1 Airway Equipment and Method: Bite block Placement Confirmation: positive ETCO2 Tube secured with: Tape Dental Injury: Teeth and Oropharynx as per pre-operative assessment

## 2022-07-09 NOTE — Anesthesia Preprocedure Evaluation (Signed)
Anesthesia Evaluation  Patient identified by MRN, date of birth, ID band Patient awake    Reviewed: Allergy & Precautions, NPO status , Patient's Chart, lab work & pertinent test results  Airway Mallampati: II  TM Distance: >3 FB Neck ROM: Full    Dental  (+) Dental Advisory Given   Pulmonary former smoker   breath sounds clear to auscultation       Cardiovascular negative cardio ROS  Rhythm:Regular Rate:Normal     Neuro/Psych   Anxiety     negative neurological ROS     GI/Hepatic negative GI ROS, Neg liver ROS,,,  Endo/Other  negative endocrine ROS    Renal/GU negative Renal ROS  negative genitourinary   Musculoskeletal   Abdominal  (+) + obese  Peds  Hematology negative hematology ROS (+)   Anesthesia Other Findings   Reproductive/Obstetrics (+) Pregnancy                             Anesthesia Physical Anesthesia Plan  ASA: 2  Anesthesia Plan: General   Post-op Pain Management: Minimal or no pain anticipated   Induction: Intravenous  PONV Risk Score and Plan: 3 and Midazolam, Dexamethasone, Ondansetron and Treatment may vary due to age or medical condition  Airway Management Planned: LMA  Additional Equipment: None  Intra-op Plan:   Post-operative Plan: Extubation in OR  Informed Consent: I have reviewed the patients History and Physical, chart, labs and discussed the procedure including the risks, benefits and alternatives for the proposed anesthesia with the patient or authorized representative who has indicated his/her understanding and acceptance.     Dental advisory given  Plan Discussed with: CRNA  Anesthesia Plan Comments:         Anesthesia Quick Evaluation

## 2022-07-09 NOTE — Progress Notes (Signed)
Patient placed in phase 2. Waiting for antibiotic to finish infusing before discharge.

## 2022-07-10 ENCOUNTER — Encounter (HOSPITAL_COMMUNITY): Payer: Self-pay | Admitting: Obstetrics and Gynecology

## 2022-07-10 LAB — RH IG WORKUP (INCLUDES ABO/RH)
Gestational Age(Wks): 12
Unit division: 0

## 2022-07-10 LAB — SURGICAL PATHOLOGY

## 2022-07-27 ENCOUNTER — Other Ambulatory Visit (HOSPITAL_COMMUNITY)
Admission: RE | Admit: 2022-07-27 | Discharge: 2022-07-27 | Disposition: A | Discharge status: Home / Self Care | Payer: Medicaid Other | Source: Ambulatory Visit | Attending: Obstetrics and Gynecology | Admitting: Obstetrics and Gynecology

## 2022-07-27 ENCOUNTER — Ambulatory Visit (INDEPENDENT_AMBULATORY_CARE_PROVIDER_SITE_OTHER): Payer: Medicaid Other | Admitting: Obstetrics and Gynecology

## 2022-07-27 ENCOUNTER — Encounter: Payer: Self-pay | Admitting: Obstetrics and Gynecology

## 2022-07-27 VITALS — BP 129/90 | HR 87 | Wt 206.0 lb

## 2022-07-27 DIAGNOSIS — N761 Subacute and chronic vaginitis: Secondary | ICD-10-CM | POA: Diagnosis not present

## 2022-07-27 DIAGNOSIS — N96 Recurrent pregnancy loss: Secondary | ICD-10-CM | POA: Diagnosis not present

## 2022-07-27 DIAGNOSIS — Z124 Encounter for screening for malignant neoplasm of cervix: Secondary | ICD-10-CM | POA: Insufficient documentation

## 2022-07-27 DIAGNOSIS — O039 Complete or unspecified spontaneous abortion without complication: Secondary | ICD-10-CM | POA: Diagnosis not present

## 2022-07-27 DIAGNOSIS — Z1231 Encounter for screening mammogram for malignant neoplasm of breast: Secondary | ICD-10-CM

## 2022-07-27 MED ORDER — NORETHIN ACE-ETH ESTRAD-FE 1-20 MG-MCG(24) PO TABS: 1.0000 | ORAL_TABLET | Freq: Every day | ORAL | 11 refills | Status: DC

## 2022-07-27 NOTE — Progress Notes (Signed)
41 yo P3 here for post op check following D&E on 07/09/22. Patient reports feeling well since her procedure reporting some occasional spotting. This was not a planned pregnancy. Patient is interested in contraception. She reports that this is her 3rd miscarriage with her current partner. She has 3 children from a previous relationship and he has one from a previous relationship. They have not been able to carry a pregnancy to term. Patient is also due for pap smear. She reports the presence of a discharge with odor and desires testing  Past Medical History:  Diagnosis Date   Arthritis    RA   Fibroids    Genital herpes    no outbreak in 60yr   Trichomonas infection    Urticaria    Past Surgical History:  Procedure Laterality Date   DILATATION & CURETTAGE/HYSTEROSCOPY WITH MYOSURE N/A 10/22/2021   [place]  Surgeon: BGriffin Basil MD;  [facility]  Service: Gynecology;  Laterality: N/A;   DILATION AND CURETTAGE OF UTERUS  2020   DILATION AND EVACUATION N/A 07/09/2022   Procedure: DILATATION AND EVACUATION;  Surgeon: CMora Bellman MD;  [facility]  Service: Gynecology;  Laterality: N/A;   INDUCED ABORTION     WISDOM TOOTH EXTRACTION     Family History  Problem Relation Age of Onset   Healthy Mother    Sickle cell trait Mother    Healthy Father    Sickle cell trait Sister    Sickle cell trait Brother    Healthy Daughter    Healthy Son    Healthy Son    Other Neg Hx    Social History   Tobacco Use   Smoking status: Former    Packs/day: 1.00    Years: 4.00    Total pack years: 4.00    Types: Cigarettes    Quit date: 02/07/2009    Years since quitting: 13.4    Passive exposure: Never   Smokeless tobacco: Never  Vaping Use   Vaping Use: Never used  Substance Use Topics   Alcohol use: Not Currently    Comment: occasional, not since confirmed pregnancy   Drug use: No   ROS See  pertinent in HPI. All other systems reviewed and non contributory Blood pressure (!) 129/90, pulse 87, weight 206 lb (93.4 kg), last menstrual period 03/24/2022. GENERAL: Well-developed, well-nourished female in no acute distress.  HEENT: Normocephalic, atraumatic. Sclerae anicteric.  NECK: Supple. Normal thyroid.  LUNGS: Clear to auscultation bilaterally.  HEART: Regular rate and rhythm. BREASTS: Symmetric in size. No palpable masses or lymphadenopathy, skin changes, or nipple drainage. ABDOMEN: Soft, nontender, nondistended. No organomegaly. PELVIC: Normal external female genitalia. Vagina is pink and rugated.  Normal discharge. Normal appearing cervix. Uterus is normal in size. No adnexal mass or tenderness. Chaperone present during the pelvic exam EXTREMITIES: No cyanosis, clubbing, or edema, 2+ distal pulses.  A/P 41yo here for post op check and with vaginitis - Pathology results reviewed with the patient - Anora test results are pending - vaginal swab collected - Pap smear collected - Screening mammogram ordered - Patient interested in contraception- options reviewed and patient opted for COC. 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

## 2022-07-29 LAB — CERVICOVAGINAL ANCILLARY ONLY
Bacterial Vaginitis (gardnerella): POSITIVE — AB
Candida Glabrata: NEGATIVE
Candida Vaginitis: NEGATIVE
Chlamydia: NEGATIVE
Comment: NEGATIVE
Comment: NEGATIVE
Comment: NEGATIVE
Comment: NEGATIVE
Comment: NEGATIVE
Comment: NORMAL
Neisseria Gonorrhea: NEGATIVE
Trichomonas: NEGATIVE

## 2022-07-29 LAB — CYTOLOGY - PAP
Comment: NEGATIVE
Diagnosis: UNDETERMINED — AB
High risk HPV: NEGATIVE

## 2022-07-30 MED ORDER — METRONIDAZOLE 500 MG PO TABS: 500.0000 mg | ORAL_TABLET | Freq: Two times a day (BID) | ORAL | 0 refills | Status: DC

## 2022-07-30 NOTE — Addendum Note (Signed)
Addended by: Mora Bellman on: 07/30/2022 12:50 PM   Modules accepted: Orders

## 2022-08-03 ENCOUNTER — Ambulatory Visit (INDEPENDENT_AMBULATORY_CARE_PROVIDER_SITE_OTHER): Payer: Medicaid Other | Admitting: Family Medicine

## 2022-08-03 ENCOUNTER — Encounter: Payer: Self-pay | Admitting: Family Medicine

## 2022-08-03 ENCOUNTER — Ambulatory Visit: Payer: Self-pay

## 2022-08-03 VITALS — BP 127/84 | HR 74 | Temp 98.1°F | Resp 16 | Wt 205.8 lb

## 2022-08-03 DIAGNOSIS — F32A Depression, unspecified: Secondary | ICD-10-CM | POA: Diagnosis not present

## 2022-08-03 DIAGNOSIS — N760 Acute vaginitis: Secondary | ICD-10-CM

## 2022-08-03 DIAGNOSIS — B9689 Other specified bacterial agents as the cause of diseases classified elsewhere: Secondary | ICD-10-CM | POA: Diagnosis not present

## 2022-08-03 MED ORDER — METRONIDAZOLE 1.3 % VA GEL: 1.0000 | Freq: Two times a day (BID) | VAGINAL | 0 refills | Status: DC
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
## 2022-08-04 ENCOUNTER — Other Ambulatory Visit: Payer: Medicaid Other | Admitting: Obstetrics and Gynecology

## 2022-08-04 ENCOUNTER — Encounter: Payer: Self-pay | Admitting: Obstetrics and Gynecology

## 2022-08-04 ENCOUNTER — Telehealth: Payer: Self-pay | Admitting: Family Medicine

## 2022-08-04 MED ORDER — MIRTAZAPINE 15 MG PO TABS: 15.0000 mg | ORAL_TABLET | Freq: Every day | ORAL | 1 refills | Status: DC

## 2022-08-04 NOTE — Patient Outreach (Signed)
Medicaid Managed Care   Nurse Care Manager Note  08/04/2022 Name:  Hannah Fleming MRN:  [id number] DOB:  07-May-1981  Hannah Fleming is an 41 y.o. year old female who is a primary patient of [name], MD.  [facility] Managed Care Coordination team was consulted for assistance with:    Chronic healthcare management needs, depression, weight management  Ms. Fletes was given information about Medicaid Managed Care Coordination team services today. Hannah Fleming Patient agreed to services and verbal consent obtained.  Engaged with patient by telephone for initial visit in response to provider referral for case management and/or care coordination services.   Assessments/Interventions:  Review of past medical history, allergies, medications, health status, including review of consultants reports, laboratory and other test data, was performed as part of comprehensive evaluation and provision of chronic care management services.  SDOH (Social Determinants of Health) assessments and interventions performed: SDOH Interventions    Flowsheet Row Patient Outreach Telephone from 08/04/2022 in [facility] Patient Outreach Telephone from 04/15/2021 in [facility] Coordination  SDOH Interventions    Food Insecurity Interventions Intervention Not Indicated --  Housing Interventions -- --  [referred patient to HR MM BSW]  Transportation Interventions -- Intervention Not Indicated  Financial Strain Interventions -- --  [referred to HR MM BSW]  Physical Activity Interventions -- Intervention Not Indicated     Care Plan  Allergies  Allergen Reactions   Prilosec Otc [Omeprazole Magnesium] Hives   Latex Itching and Rash    Medications Reviewed Today     Reviewed by [name], RN (Registered Nurse) on 08/04/22 at 54  Med List Status: <None>   Medication Order Taking? Sig Documenting Provider Last Dose Status Informant   acetaminophen (TYLENOL) 500 MG tablet [id number]  Take 1,000 mg by mouth every 6 (six) hours as needed for mild pain or moderate pain.  Patient not taking: Reported on 08/03/2022   [provider]  Active Self  ibuprofen (ADVIL) 600 MG tablet [id number]  Take 1 tablet (600 mg total) by mouth every 6 (six) hours as needed.  Patient not taking: Reported on 08/03/2022   Constant, Peggy, MD  Active   metroNIDAZOLE (FLAGYL) 500 MG tablet [id number] No Take 1 tablet (500 mg total) by mouth 2 (two) times daily.  Patient not taking: Reported on 08/04/2022   Constant, Peggy, MD Not Taking Active   metroNIDAZOLE 1.3 % GEL [id number]  Place 1 Applicatorful vaginally 2 (two) times daily. [name], MD  Active   Norethindrone Acetate-Ethinyl Estrad-FE (LOESTRIN 24 FE) 1-20 MG-MCG(24) tablet [id number]  Take 1 tablet by mouth daily. Constant, Peggy, MD  Active   oxyCODONE-acetaminophen (PERCOCET/ROXICET) 5-325 MG tablet [id number]  Take 1 tablet by mouth every 6 (six) hours as needed.  Patient not taking: Reported on 08/03/2022   Constant, Peggy, MD  Active   Prenat-Fe Carbonyl-FA-Omega 3 (ONE-A-DAY WOMENS PRENATAL 1) 28-0.8-235 MG CAPS [id number]  Take 1 capsule by mouth daily.  Patient not taking: Reported on 08/03/2022   [provider]  Active Self  promethazine (PHENERGAN) 25 MG tablet [id number]  Take 1 tablet (25 mg total) by mouth every 6 (six) hours as needed for nausea or vomiting.  Patient not taking: Reported on 07/02/2022   [name], MD  Active Self           Conditions to be addressed/monitored per PCP order:  Chronic healthcare management needs, depression, weight management  Care Plan :  RN Care Manager Plan Of Care  Updates made by [name], RN since 08/04/2022 12:00 AM     Problem: Chronic Disease Management and Care Coordination Needs      Long-Range Goal: Development of Plan Of Care For Chronic Disease Management and Care Coordination Needs To  Assist With Meeting Treatment Goals   Start Date: 04/15/2021  Expected End Date: 11/03/2022  Priority: High  Note:   Current Barriers:  Care Coordination needs related to depression Chronic Disease Management support and education needs related to weight concerns RNCM Clinical Goal(s): Patient will verbalize basic understanding of weight management and depression disease process and self health management plan .  take all medications exactly as prescribed and will call provider for medication related questions attend all scheduled medical appointments: with primary care and specialist demonstrate ongoing adherence to prescribed treatment plan for  weight management and depression as evidenced by exercise 5-7 days/week adherence to prescribed medication regimen contacting provider for new or worsened symptoms or questions . continue to work with RN Care Manager to address care management and care coordination needs related to weight management and depression work with Education officer, museum to address depression through collaboration with Consulting civil engineer, provider, and care tem.  Interventions Inter-disciplinary  care team collaboration (see longitudinal plan of care) Evaluation of current treatment plan related to  self management and patient's adherence to plan as established by provider Collaborated with LCSW LCSW referral for depression   Weight Loss:  (Status: Goal on track: NO.) Provided patient with contact information for managed Medicaid health plan customer service phone number about weight management benefits  05/13/21- Reviewed weight management benefits offered by patient's health plan and again encouraged her to call customer service for details 08/04/22:  Discussed nutrition referral and/or Healthy Weight and Wellness-patient to f/u with provider  PreDiabetes:  (Status: Goal on track: NO.) Lab Results  Component Value Date   HGBA1C 5.7 (H) 12/06/2019  Assessed patient's understanding  of A1c goal:  <5.7% Provided education to patient about basic prediabetes disease process; Provided written information on prediabetes eating plan Counseled on importance of regular laboratory monitoring as prescribed;        Discussed plans with patient for ongoing care management follow up and provided patient with direct contact information for care management team;      05/13/21- Encouraged patient to review educational materials related to prediabetes mailed to her last month  Pain:  (Status: New goal. Goal on track: YES.) Pain assessment performed Medications reviewed Reviewed provider established plan for pain management; Discussed importance of adherence to all scheduled medical appointments; Counseled on the importance of reporting any/all new or changed pain symptoms or management strategies to pain management provider; Reviewed with patient prescribed pharmacological and nonpharmacological pain relief strategies; Provided written health plan information related to alternative healing benefits such as  massage and acupuncture 05/13/21- Encouraged patient to review chronic pain education mailed to her last month 08/04/22:  No complaints of pain  Patient Goals/Self-Care Activities: Patient will self administer medications as prescribed  Patient will attend all scheduled provider appointments  Patient will review educational material mailed to her on 04/15/21 Patient will continue to perform ADL's independently Patient will continue to perform IADL's independently Patient will call provider office for new concerns or questions Patient will follow up with provider regarding medication and weight management     Follow Up:  Patient agrees to Care Plan and Follow-up.  Plan: The Managed Medicaid care management team will reach  out to the patient again over the next 30 business  days. and The  Patient has been provided with contact information for the Managed Medicaid care management team  and has been advised to call with any health related questions or concerns.  Date/time of next scheduled RN care management/care coordination outreach: 09/08/22 at 1230

## 2022-08-04 NOTE — Patient Instructions (Signed)
Hi Ms. Geyer, thanks for speaking with me today-have a nice afternoon!!  Ms. Penning was given information about Medicaid Managed Care team care coordination services as a part of their [place] Medicaid benefit. [name] verbally consented to engagement with the [facility] Managed Care team.   If you are experiencing a medical emergency, please call 911 or report to your local emergency department or urgent care.   If you have a non-emergency medical problem during routine business hours, please contact your provider's office and ask to speak with a nurse.   For questions related to your [facility], please call: [phone] or visit the homepage here: [email or web address]  If you would like to schedule transportation through your [facility], please call the following number at least 2 days in advance of your appointment: [phone]   Rides for urgent appointments can also be made after hours by calling Member Services.  Call the [place] at [phone], at any time, 24 hours a day, 7 days a week. If you are in danger or need immediate medical attention call 911.  If you would like help to quit smoking, call 1-800-QUIT-NOW (720) 726-5500) OR Espaol: 1-855-Djelo-Ya (6-269-485-4627) o para ms informacin haga clic aqu or Text READY to 200-400 to register via text  Ms. Betts - following are the goals we discussed in your visit today:   Goals Addressed             This Visit's Progress    Cope with Chronic Pain       Timeframe:  Long-Range Goal Priority:  Medium Start Date:     04/15/21                        Expected End Date:    ongoing                   Follow Up Date 09/08/22   - learn relaxation techniques - practice acceptance of chronic pain - practice relaxation or meditation daily - spend time with positive  people - think of new ways to do favorite things - use distraction techniques - use relaxation during pain  - review written information mailed to me on chronic pain   Why is this important?   Stress makes chronic pain feel worse.  Feelings like depression, anxiety, stress and anger can make your body more sensitive to pain.  Learning ways to cope with stress or depression may help you find some relief from the pain.     Notes: 08/04/22:  Patient with no complaints of pain.     Find Help in My Community       Timeframe:  Short-Term Goal Priority:  High Start Date:       04/15/21                      Expected End Date:    ongoing                   Follow Up Date 09/08/22   - follow-up on any referrals for help I am given    Why is this important?   Knowing how and where to find help for yourself or family in your neighborhood and community is an important skill.  You will want to take some steps to learn how.    08/04/22:  patient to follow up with PCP  regarding new medication.  Referral placed to LCSW for depression.    Learn More About My Health       Timeframe:  Long-Range Goal Priority:  High Start Date:   04/15/21                          Expected End Date:     ongoing                  Follow Up Date 09/08/22   - make a list of questions - ask questions - review educational material mailed to me  on prediabetes and chronic pain - call my health plan customer service number to secure replacement card and to find out about benefits related to weight management, phone program and earning money via completing wellness activities  - review information mailed to me about health plan benefits   Why is this important?   The best way to learn about your health and care is by talking to the doctor and nurse.  They will answer your questions and give you information in the way that you like best.    08/04/22:  Patient to call health plan regarding new card and about weight  management benefits   Patient verbalizes understanding of instructions and care plan provided today and agrees to view in [place]. Active MyChart status and patient understanding of how to access instructions and care plan via MyChart confirmed with patient.     The Managed Medicaid care management team will reach out to the patient again over the next 30 business  days.  The  Patient  has been provided with contact information for the Managed Medicaid care management team and has been advised to call with any health related questions or concerns.   Aida Raider RN, BSN [place] Management Coordinator - Managed Medicaid High Risk [phone]   Following is a copy of your plan of care:  Care Plan : [place]  Updates made by [name], RN since 08/04/2022 12:00 AM     Problem: Chronic Disease Management and Care Coordination Needs      Long-Range Goal: Development of Plan Of Care For Chronic Disease Management and Care Coordination Needs To Assist With Meeting Treatment Goals   Start Date: 04/15/2021  Expected End Date: 11/03/2022  Priority: High  Note:   Current Barriers:  Care Coordination needs related to depression Chronic Disease Management support and education needs related to weight concerns RNCM Clinical Goal(s): Patient will verbalize basic understanding of weight management and depression disease process and self health management plan .  take all medications exactly as prescribed and will call provider for medication related questions attend all scheduled medical appointments: with primary care and specialist demonstrate ongoing adherence to prescribed treatment plan for  weight management and depression as evidenced by exercise 5-7 days/week adherence to prescribed medication regimen contacting provider for new or worsened symptoms or questions . continue to work with RN Care Manager to address care management and  care coordination needs related to weight management and depression work with Education officer, museum to address depression through collaboration with Consulting civil engineer, provider, and care tem.  Interventions Inter-disciplinary  care team collaboration (see longitudinal plan of care) Evaluation of current treatment plan related to  self management and patient's adherence to plan as established by provider Collaborated with LCSW LCSW referral for depression   Weight Loss:  (  Status: Goal on track: NO.) Provided patient with contact information for managed Medicaid health plan customer service phone number about weight management benefits  05/13/21- Reviewed weight management benefits offered by patient's health plan and again encouraged her to call customer service for details 08/04/22:  Discussed nutrition referral and/or Healthy Weight and Wellness-patient to f/u with provider  PreDiabetes:  (Status: Goal on track: NO.) Lab Results  Component Value Date   HGBA1C 5.7 (H) 12/06/2019  Assessed patient's understanding of A1c goal:  <5.7% Provided education to patient about basic prediabetes disease process; Provided written information on prediabetes eating plan Counseled on importance of regular laboratory monitoring as prescribed;        Discussed plans with patient for ongoing care management follow up and provided patient with direct contact information for care management team;      05/13/21- Encouraged patient to review educational materials related to prediabetes mailed to her last month  Pain:  (Status: New goal. Goal on track: YES.) Pain assessment performed Medications reviewed Reviewed provider established plan for pain management; Discussed importance of adherence to all scheduled medical appointments; Counseled on the importance of reporting any/all new or changed pain symptoms or management strategies to pain management provider; Reviewed with patient prescribed pharmacological and  nonpharmacological pain relief strategies; Provided written health plan information related to alternative healing benefits such as  massage and acupuncture 05/13/21- Encouraged patient to review chronic pain education mailed to her last month 08/04/22:  No complaints of pain  Patient Goals/Self-Care Activities: Patient will self administer medications as prescribed  Patient will attend all scheduled provider appointments  Patient will review educational material mailed to her on 04/15/21 Patient will continue to perform ADL's independently Patient will continue to perform IADL's independently Patient will call provider office for new concerns or questions Patient will follow up with provider regarding medication and weight management

## 2022-08-04 NOTE — Telephone Encounter (Signed)
Provider to send in medication

## 2022-08-04 NOTE — Telephone Encounter (Signed)
Pt was seen yesterday and received RX for Metronidazole Gel but not the other medication that she was instructed on how to take / she doesn't know what the name of the medication is that was suppose to be sent to the pharmacy with the Gel / she would like the RX for the medication to be sent to the [facility] on [address] instead of [facility]/ please advise asap / pt was instructed to start the medication last night

## 2022-08-05 ENCOUNTER — Telehealth: Payer: Self-pay | Admitting: *Deleted

## 2022-08-05 NOTE — Telephone Encounter (Unsigned)
Copied from [place] [id number]. Topic: Referral - Question >> Aug 04, 2022  3:36 PM Hannah Fleming wrote: Reason for CRM: pt stated she received referral for a therapist and was advised they would call her today / pt stated no one has called / please advise

## 2022-08-06 ENCOUNTER — Ambulatory Visit: Payer: Self-pay | Admitting: Obstetrics

## 2022-08-06 ENCOUNTER — Encounter: Payer: Self-pay | Admitting: Family Medicine

## 2022-08-06 ENCOUNTER — Ambulatory Visit: Payer: Medicaid Other | Attending: Obstetrics

## 2022-08-06 ENCOUNTER — Other Ambulatory Visit: Payer: Self-pay

## 2022-08-06 DIAGNOSIS — Z8279 Family history of other congenital malformations, deformations and chromosomal abnormalities: Secondary | ICD-10-CM | POA: Diagnosis not present

## 2022-08-06 DIAGNOSIS — N96 Recurrent pregnancy loss: Secondary | ICD-10-CM | POA: Diagnosis not present

## 2022-08-06 NOTE — Progress Notes (Signed)
Virtual Visit via Video Note  I connected with Hannah Fleming on 08/06/22 at 10:00 AM EST by a video enabled telemedicine application and verified that I am speaking with the correct person using two identifiers.  Location: Patient: home Provider: [facility]   I discussed the limitations of evaluation and management by telemedicine and the availability of in person appointments. The patient expressed understanding and agreed to proceed.  Referring provider: Mora Bellman, MD Length of consultation: 60 minutes  Hannah Fleming was referred for genetic counseling at [facility] due to her history of recurrent pregnancy loss and the abnormal results on her recent Anora testing. She was present at this visit alone.  We first obtained a detailed family history and pregnancy history.  Hannah Fleming is not currently pregnant.  She and her partner, Georgina Snell, had a 12 weeks miscarriage in November 2023.  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  The patient stated that an ultrasound was performed early in that pregnancy which raised the question of a very early twin demise, though no comment was made about that possibly finding on her ultrasounds on 05/26/22 or 07/02/22.  This was the first pregnancy with her current partner.  Prior to this pregnancy, the patient had three healthy, term vaginal deliveries.  Those children are as follows:  female, age 29 years; female, age 61 years; female age 61 years. She had three elective terminations for personal reasons.  In addition, she has had two other spontaneous miscarriages (in 2013 and 2019) at 6-[redacted] weeks gestation with no genetic testing.  The remainder of the family history is reported to be unremarkable for birth defects, intellectual delays, stillbirth, recurrent pregnancy loss or known genetic conditions.  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 No other copy number differences were noted on this report. The report also states parent of origin, which is maternal, meaning that the extra chromosome maternal was inherited in the egg.  Trisomy 18 is one of the most common chromosome differences seen in first trimester pregnancy losses. It most often occurs due to nondisjunction, or abnormal separation, of the chromosomes during production of the egg or sperm. Trisomy may also occur as the result of a chromosome translocation in a parent, though this is much less common. The microarray results did not show any other imbalance to suggest involvement of other chromosomes which would indicate a higher chance for a translocation. In the absence of a chromosome translocation, the recurrence for any aneuploidy for this couple is estimated to be 1% or the maternal age related risk. Because Hannah Fleming will be at least 41 years old at the time of any future pregnancy, we would expect her risks to be the same as her age related chance.   Recurrent Pregnancy Loss: Hannah Fleming had two unexplained early miscarriages as well as the recent loss with trisomy 47.  We reviewed that Recurrent Pregnancy Loss (RPL) is defined as three or more clinically recognized consecutive or non-consecutive pregnancy losses occurring prior to fetal viability (<[redacted] weeks gestation). Pregnancy losses may occur for many reasons, including endocrine factors (i.e. uncontrolled diabetes mellitus, luteal phase deficiency, or polycystic ovarian syndrome), environmental agents, immunologic causes (i.e. antiphospholipid syndrome), maternal factors (i.e. uterine anatomic malformations, cervical abnormalities), as well as chromosomal and single-gene disorders. The overall rate of pregnancy loss is  approximately 20%, with the majority occurring in the first trimester. After 28 weeks of pregnancy, the incidence is  about 3-6% and attributed causes include placental problems (i.e. abruption, infarction), maternal conditions, and congenital malformations. Chromosomal causes account for 50-70% of first trimester losses but are less commonly cited as a cause for late gestational pregnancy loss. Most chromosome abnormalities are a result of a new change in the sperm or egg that conceived the fetus; however, in 3-5% of all couples with RPL one member of the couple is found to have a balanced chromosome rearrangement, called a translocation. A translocation occurs when there is an exchange in chromosome material, such as when a segment of one chromosome breaks off and reattaches to a different chromosome. When the translocation is balanced, there is no extra or missing genetic material; however, an individual who carries a balanced translocation has an increased risk to pass unbalanced chromosomes to their offspring. In couples in which one partner is a carrier of a balanced translocation, this can result in an increased risk for infertility, RPL, and offspring with abnormal chromosomes.   Testing options: Hannah Fleming was counseled the following testing options for her history of recurrent pregnancy loss and Anora results: Chromosome analysis, or karyotype, could assess for structural chromosome differences in Hannah Fleming that could possibly become unbalanced in her children.  Acquired thrombophilias:  anticardiolipin and lupus anticoagulant antibodies for antiphospholipid syndrome and Beta-2-glycoprotein testing. Uterine evaluation via specialty ultrasound such as a sonohysterogram may be warranted; thus, a referral to reproductive endocrinology could be considered.  However, because she has had three successful term pregnancies this possibility would be less likely.   Plan of care: Hannah Fleming indicated that she is unsure about her future plans for pregnancy.  She will consider the options of karyotype and testing for  acquired thrombophilias. She will let us know if she desires this testing or decides to pursue another pregnancy.  We briefly touched upon screening and diagnostic testing options in any future pregnancy and are happy to revisit this as needed.  We appreciated being involved in the care of this family and can be reached at [phone] or [phone].  Wilburt Finlay, MS, CGC  I provided 60 minutes of non-face-to-face time during this encounter.   Donette Larry

## 2022-08-06 NOTE — Progress Notes (Signed)
Established Patient Office Visit  Subjective    Patient ID: Hannah Fleming, female    DOB: 1980/11/23  Age: 41 y.o. MRN: [id number]  CC:  Chief Complaint  Patient presents with   Depression    HPI Hannah Fleming presents for complaint of BV. She would like to get the vaginal cream as opposed to the tablets for treatment. Patient also reports that she has had a lot of issues socially and personally and that has affected her mood.    Outpatient Encounter Medications as of 08/03/2022  Medication Sig   metroNIDAZOLE (FLAGYL) 500 MG tablet Take 1 tablet (500 mg total) by mouth 2 (two) times daily. (Patient not taking: Reported on 08/04/2022)   metroNIDAZOLE 1.3 % GEL Place 1 Applicatorful vaginally 2 (two) times daily.   mirtazapine (REMERON) 15 MG tablet Take 1 tablet (15 mg total) by mouth at bedtime.   Norethindrone Acetate-Ethinyl Estrad-FE (LOESTRIN 24 FE) 1-20 MG-MCG(24) tablet Take 1 tablet by mouth daily.   acetaminophen (TYLENOL) 500 MG tablet Take 1,000 mg by mouth every 6 (six) hours as needed for mild pain or moderate pain. (Patient not taking: Reported on 08/03/2022)   ibuprofen (ADVIL) 600 MG tablet Take 1 tablet (600 mg total) by mouth every 6 (six) hours as needed. (Patient not taking: Reported on 08/03/2022)   oxyCODONE-acetaminophen (PERCOCET/ROXICET) 5-325 MG tablet Take 1 tablet by mouth every 6 (six) hours as needed. (Patient not taking: Reported on 08/03/2022)   Prenat-Fe Carbonyl-FA-Omega 3 (ONE-A-DAY WOMENS PRENATAL 1) 28-0.8-235 MG CAPS Take 1 capsule by mouth daily. (Patient not taking: Reported on 08/03/2022)   promethazine (PHENERGAN) 25 MG tablet Take 1 tablet (25 mg total) by mouth every 6 (six) hours as needed for nausea or vomiting. (Patient not taking: Reported on 07/02/2022)   No facility-administered encounter medications on file as of 08/03/2022.    Past Medical History:  Diagnosis Date   Arthritis    RA   Fibroids    Genital herpes    no  outbreak in 38yr   Trichomonas infection    Urticaria     Past Surgical History:  Procedure Laterality Date   DILATATION & CURETTAGE/HYSTEROSCOPY WITH MYOSURE N/A 10/22/2021   Procedure: DILATATION & CURETTAGE/HYSTEROSCOPY WITH MYOSURE;  Surgeon: BGriffin Basil MD;  [facility]  Service: Gynecology;  Laterality: N/A;   DILATION AND CURETTAGE OF UTERUS  2020   DILATION AND EVACUATION N/A 07/09/2022   Procedure: DILATATION AND EVACUATION;  Surgeon: CMora Bellman MD;  [facility]  Service: Gynecology;  Laterality: N/A;   INDUCED ABORTION     WISDOM TOOTH EXTRACTION      Family History  Problem Relation Age of Onset   Healthy Mother    Sickle cell trait Mother    Healthy Father    Sickle cell trait Sister    Sickle cell trait Brother    Healthy Daughter    Healthy Son    Healthy Son    Other Neg Hx     Social History   Socioeconomic History   Marital status: Single    Spouse name: Not on file   Number of children: 3   Years of education: Not on file   Highest education level: Not on file  Occupational History   Not on file  Tobacco Use   Smoking status: Former    Packs/day: 1.00    Years: 4.00    Total pack years: 4.00    Types: Cigarettes  Quit date: 02/07/2009    Years since quitting: 13.5    Passive exposure: Never   Smokeless tobacco: Never  Vaping Use   Vaping Use: Never used  Substance and Sexual Activity   Alcohol use: Not Currently    Comment: occasional, not since confirmed pregnancy   Drug use: No   Sexual activity: Yes    Partners: Male    Birth control/protection: None    Comment: approx [redacted] wks gestation per patient  Other Topics Concern   Not on file  Social History Narrative   Not on file   Social Determinants of Health   Financial Resource Strain: High Risk (04/15/2021)   Overall Financial Resource Strain (CARDIA)    Difficulty of Paying Living Expenses: Hard  Food Insecurity: No Food Insecurity  (08/04/2022)   Hunger Vital Sign    Worried About Running Out of Food in the Last Year: Never true    Ran Out of Food in the Last Year: Never true  Transportation Needs: No Transportation Needs (04/15/2021)   PRAPARE - Hydrologist (Medical): No    Lack of Transportation (Non-Medical): No  Physical Activity: Sufficiently Active (04/15/2021)   Exercise Vital Sign    Days of Exercise per Week: 7 days    Minutes of Exercise per Session: 30 min  Stress: Not on file  Social Connections: Not on file  Intimate Partner Violence: Not At Risk (08/04/2022)   Humiliation, Afraid, Rape, and Kick questionnaire    Fear of Current or Ex-Partner: No    Emotionally Abused: No    Physically Abused: No    Sexually Abused: No    Review of Systems  Psychiatric/Behavioral:  Positive for depression. Negative for suicidal ideas. The patient has insomnia. The patient is not nervous/anxious.   All other systems reviewed and are negative.       Objective    BP 127/84   Pulse 74   Temp 98.1 F (36.7 C) (Oral)   Resp 16   Wt 205 lb 12.8 oz (93.4 kg)   LMP 03/24/2022 (Approximate)   SpO2 98%   Breastfeeding Unknown   BMI 35.33 kg/m   Physical Exam Vitals and nursing note reviewed.  Constitutional:      General: She is not in acute distress. Cardiovascular:     Rate and Rhythm: Normal rate and regular rhythm.  Pulmonary:     Effort: Pulmonary effort is normal.     Breath sounds: Normal breath sounds.  Neurological:     General: No focal deficit present.     Mental Status: She is alert and oriented to person, place, and time.  Psychiatric:        Mood and Affect: Mood is depressed. Affect is tearful.        Behavior: Behavior normal. Behavior is cooperative.         Assessment & Plan:   1. Depression, unspecified depression type Remeron 15 mg daily prescribed. monitor  2. Bacterial vaginosis Metrogel vag prescribed    Return in about 4 weeks (around  08/31/2022) for follow up.   Becky Sax, MD

## 2022-08-07 ENCOUNTER — Other Ambulatory Visit: Payer: Self-pay | Admitting: Family Medicine

## 2022-08-07 ENCOUNTER — Other Ambulatory Visit: Payer: Medicaid Other | Admitting: Licensed Clinical Social Worker

## 2022-08-07 DIAGNOSIS — F419 Anxiety disorder, unspecified: Secondary | ICD-10-CM

## 2022-08-07 NOTE — Patient Outreach (Signed)
Medicaid Managed Care Social Work Note  08/07/2022 Name:  Hannah Fleming MRN:  [id number] DOB:  1981-05-31  Hannah Fleming is an 41 y.o. year old female who is a primary patient of [name], MD.  The Medicaid Managed Care Coordination team was consulted for assistance with:  [place] and Resources  [name] was given information about Medicaid Managed Care Coordination team services today. Hannah Fleming Patient agreed to services and verbal consent obtained.  Engaged with patient  for by telephone forinitial visit in response to referral for case management and/or care coordination services.   Assessments/Interventions:  Review of past medical history, allergies, medications, health status, including review of consultants reports, laboratory and other test data, was performed as part of comprehensive evaluation and provision of chronic care management services.  SDOH: (Social Determinant of Health) assessments and interventions performed: SDOH Interventions    Flowsheet Row Patient Outreach Telephone from 08/07/2022 in [place] Patient Outreach Telephone from 08/04/2022 in [place] Patient Outreach Telephone from 04/15/2021 in [place] Coordination  SDOH Interventions     Food Insecurity Interventions -- Intervention Not Indicated --  Housing Interventions -- -- --  [referred patient to [facility] BSW]  Transportation Interventions -- -- Intervention Not Indicated  Depression Interventions/Treatment  Counseling -- --  Museum/gallery curator Strain Interventions -- -- --  [referred to [facility] BSW]  Physical Activity Interventions -- -- Intervention Not Indicated  Stress Interventions Offered [employer], Provide Counseling -- --       Advanced Directives Status:  See Care Plan for related entries.  Care Plan                 Allergies  Allergen Reactions    Prilosec Otc [Omeprazole Magnesium] Hives   Latex Itching and Rash    Medications Reviewed Today     Reviewed by Greg Cutter, LCSW (Social Worker) on 08/07/22 at [place] List Status: <None>   Medication Order Taking? Sig Documenting Provider Last Dose Status Informant  acetaminophen (TYLENOL) 500 MG tablet [id number] No Take 1,000 mg by mouth every 6 (six) hours as needed for mild pain or moderate pain.  Patient not taking: Reported on 08/03/2022   [provider] Not Taking Active Self  ibuprofen (ADVIL) 600 MG tablet [id number] No Take 1 tablet (600 mg total) by mouth every 6 (six) hours as needed.  Patient not taking: Reported on 08/03/2022   Constant, Peggy, MD Not Taking Active   metroNIDAZOLE (FLAGYL) 500 MG tablet [id number] No Take 1 tablet (500 mg total) by mouth 2 (two) times daily.  Patient not taking: Reported on 08/04/2022   Constant, Peggy, MD Not Taking Active   metroNIDAZOLE 1.3 % GEL [id number]  Place 1 Applicatorful vaginally 2 (two) times daily. [name], MD  Active   mirtazapine (REMERON) 15 MG tablet [id number]  Take 1 tablet (15 mg total) by mouth at bedtime. [name], MD  Active   Norethindrone Acetate-Ethinyl Estrad-FE (LOESTRIN 24 FE) 1-20 MG-MCG(24) tablet [id number] No Take 1 tablet by mouth daily. Constant, Peggy, MD Taking Active   oxyCODONE-acetaminophen (PERCOCET/ROXICET) 5-325 MG tablet [id number] No Take 1 tablet by mouth every 6 (six) hours as needed.  Patient not taking: Reported on 08/03/2022   Constant, Peggy, MD Not Taking Active   Prenat-Fe Carbonyl-FA-Omega 3 (ONE-A-DAY WOMENS PRENATAL 1) 28-0.8-235 MG CAPS [id number] No Take 1 capsule by mouth daily.  Patient not  taking: Reported on 08/03/2022   [provider] Not Taking Active Self  promethazine (PHENERGAN) 25 MG tablet [id number] No Take 1 tablet (25 mg total) by mouth every 6 (six) hours as needed for nausea or vomiting.  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   Bacterial vaginitis 11/27/2020   Candida vaginitis 11/27/2020   Prediabetes 06/21/2019   Ear itch 03/30/2019   Tonsillar calculus 03/30/2019   Menorrhagia with regular cycle 12/28/2013   Anxiety 02/07/2013   Dysmenorrhea 02/07/2013    Conditions to be addressed/monitored per PCP order:  Depression  Care Plan : LCSW Plan of Care  Updates made by Greg Cutter, LCSW since 08/07/2022 12:00 AM     Problem: Depression Identification (Depression)      Long-Range Goal: Depressive Symptoms Identified   Start Date: 08/07/2022  Note:   Priority: High  Timeframe:  Long-Range Goal Priority:  High Start Date:   08/07/22             Expected End Date:  ongoing                     Follow Up Date--08/14/22 at 1015 am  - keep 90 percent of scheduled appointments -consider counseling or psychiatry -consider bumping up your self-care  -consider creating a stronger support network   Why is this important?             Combatting depression may take some time.            If you don't feel better right away, don't give up on your treatment plan.    Current barriers:   Chronic Mental Health needs related to depression, stress and anxiety. Patient requires Support, Education, Resources, Referrals, Advocacy, and Care Coordination, in order to meet Unmet Mental Health Needs. Patient will implement clinical interventions discussed today to decrease symptoms of depression and increase knowledge and/or ability of: coping skills. Mental Health Concerns and Social Isolation Patient lacks knowledge of available community counseling agencies and resources.  Clinical Goal(s): verbalize understanding of plan for management of  Anxiety, Depression, and Stress and demonstrate a reduction in symptoms. Patient will connect with a provider for ongoing mental health treatment, increase coping skills, healthy habits, self-management skills, and stress reduction        Clinical Interventions:  Assessed patient's previous and current treatment, coping skills, support system and barriers to care. Patient provided hx Patient reports that she has had a lot of issues socially and personally and that has affected her mood. Patient is only interested in counseling at this time as PCP has prescribed her a new medication to help her mood. Verbalization of feelings encouraged, motivational interviewing employed Emotional support provided, positive coping strategies explored. Establishing healthy boundaries emphasized and healthy self-care education provided Patient was educated on available mental health resources within their area that accept Medicaid and offer counseling and psychiatry. Email sent to patient today with resource request- [place]  [place], [place] [address] [place] Medicare,  [place], [place];  [employer] ; [place], [place] , [place] as well as instructions for scheduling at [facility] as  well as some crisis support resources and [facility]'s walk in clinic hours Patient is agreeable to referral to [facility] for counseling ONLY. [facility] LCSW made referral on 08/07/22. LCSW provided education on relaxation techniques such as meditation, deep breathing, massage, grounding exercises or yoga that can activate the body's relaxation response and ease symptoms of stress and anxiety. LCSW ask that when pt is struggling with difficult emotions and racing thoughts that they start this relaxation response process. LCSW provided extensive education on healthy coping skills for anxiety. SW used active and reflective listening, validated patient's feelings/concerns, and provided emotional  support. Patient will work on implementing appropriate self-care habits into their daily routine such as: staying positive, writing a gratitude list, drinking water, staying active around the house, taking their medications as prescribed, combating negative thoughts or emotions and staying connected with their family and friends. Positive reinforcement provided for this decision to work on this. Motivational Interviewing employed Depression screen reviewed  PHQ2/ PHQ9 completed or reviewed  Mindfulness or Relaxation training provided Active listening / Reflection utilized  Advance Care and HCPOA education provided Emotional Support Provided Problem [place] strategies reviewed Provided psychoeducation for mental health needs  Provided brief CBT  Reviewed mental health medications and discussed importance of compliance:  Quality of sleep assessed & Sleep Hygiene techniques promoted  Participation in counseling encouraged  Verbalization of feelings encouraged  Suicidal Ideation/Homicidal Ideation assessed: Patient denies SI/HI  Review resources, discussed options and provided patient information about  [place] care team collaboration (see longitudinal plan of care) Patient Goals/Self-Care Activities: Over the next 120 days Attend scheduled medical appointments Utilize healthy coping skills and supportive resources discussed Contact PCP with any questions or concerns Keep 90 percent of counseling appointments Call your insurance provider for more information about your Enhanced Benefits  Check out counseling resources provided  Begin personal counseling with LCSW, to reduce and manage symptoms of Depression and Stress, until well-established with mental health provider Accept all calls from representative with [facility] in an effort to establish ongoing mental health counseling and supportive services. Incorporate into daily practice - relaxation  techniques, deep breathing exercises, and mindfulness meditation strategies. Talk about feelings with friends, family members, spiritual advisor, etc. Contact LCSW directly [phone]), if you have questions, need assistance, or if additional social work needs are identified between now and our next scheduled telephone outreach call. Call 988 for mental health hotline/crisis line if needed (24/7 available) Try techniques to reduce symptoms of anxiety/negative thinking (deep breathing, distraction, positive self talk, etc)  - develop a personal safety plan - develop a plan to deal with triggers like holidays, anniversaries - exercise at least 2 to 3 times per week - have a plan for how to handle bad days - journal feelings and what helps to feel better or worse - spend time or talk with others at least 2 to 3 times per week - watch for early signs of feeling worse - begin personal counseling - call and visit an old friend - check out volunteer opportunities - join a support group - laugh; watch a funny movie or comedian - learn and use visualization or guided imagery - perform a random act of kindness - practice relaxation or meditation daily - start or continue a personal journal - practice positive thinking and self-talk -continue with compliance of taking medication  -identify current effective and ineffective coping strategies.  -implement positive self-talk in care to increase self-esteem, confidence and feelings of control.  -consider  alternative and complementary therapy approaches such as meditation, mindfulness or yoga.  -journaling, prayer, worship services, meditation or pastoral counseling.  -increase participation in pleasurable group activities such as hobbies, singing, sports or volunteering).  -consider the use of meditative movement therapy such as tai chi, yoga or qigong.  -start a regular daily exercise program based on tolerance, ability and patient choice to  support positive thinking and activity    10 LITTLE Things To Do When You're Feeling Too Down To Do Anything  Take a shower. Even if you plan to stay in all day long and not see a soul, take a shower. It takes the most effort to hop in to the shower but once you do, you'll feel immediate results. It will wake you up and you'll be feeling much fresher (and cleaner too).  Brush and floss your teeth. Give your teeth a good brushing with a floss finish. It's a small task but it feels so good and you can check 'taking care of your health' off the list of things to do.  Do something small on your list. Most of [place] have some small thing we would like to get done (load of laundry, sew a button, email a friend). Doing one of these things will make you feel like you've accomplished something.  Drink water. Drinking water is easy right? It's also really beneficial for your health so keep a glass beside you all day and take sips often. It gives you energy and prevents you from boredom eating.  Do some floor exercises. The last thing you want to do is exercise but it might be just the thing you need the most. Keep it simple and do exercises that involve sitting or laying on the floor. Even the smallest of exercises release chemicals in the brain that make you feel good. Yoga stretches or core exercises are going to make you feel good with minimal effort.  Make your bed. Making your bed takes a few minutes but it's productive and you'll feel relieved when it's done. An unmade bed is a huge visual reminder that you're having an unproductive day. Do it and consider it your housework for the day.  Put on some nice clothes. Take the sweatpants off even if you don't plan to go anywhere. Put on clothes that make you feel good. Take a look in the mirror so your brain recognizes the sweatpants have been replaced with clothes that make you look great. It's an instant confidence booster.  Wash the dishes. A pile of  dirty dishes in the sink is a reflection of your mood. It's possible that if you wash up the dishes, your mood will follow suit. It's worth a try.  Cook a real meal. If you have the luxury to have a "do nothing" day, you have time to make a real meal for yourself. Make a meal that you love to eat. The process is good to get you out of the funk and the food will ensure you have more energy for tomorrow.  Write out your thoughts by hand. When you hand write, you stimulate your brain to focus on the moment that you're in so make yourself comfortable and write whatever comes into your mind. Put those thoughts out on paper so they stop spinning around in your head. Those thoughts might be the very thing holding you down.  The following coping skill education was provided for stress relief and mental health management: "When your car dies or a  deadline looms, how do you respond? Long-term, low-grade or acute stress takes a serious toll on your body and mind, so don't ignore feelings of constant tension. Stress is a natural part of life. However, too much stress can harm our health, especially if it continues every day. This is chronic stress and can put you at risk for heart problems like heart disease and depression. Understand what's happening inside your body and learn simple coping skills to combat the negative impacts of everyday stressors.  Types of Stress There are two types of stress: Emotional - types of emotional stress are relationship problems, pressure at work, financial worries, experiencing discrimination or having a major life change. Physical - Examples of physical stress include being sick having pain, not sleeping well, recovery from an injury or having an alcohol and drug use disorder. Fight or Flight Sudden or ongoing stress activates your nervous system and floods your bloodstream with adrenaline and cortisol, two hormones that raise blood pressure, increase heart rate and spike blood  sugar. These changes pitch your body into a fight or flight response. That enabled our ancestors to outrun saber-toothed tigers, and it's helpful today for situations like dodging a car accident. But most modern chronic stressors, such as finances or a challenging relationship, keep your body in that heightened state, which hurts your health. Effects of Too Much Stress If constantly under stress, most of [place] will eventually start to function less well.  Multiple studies link chronic stress to a higher risk of heart disease, stroke, depression, weight gain, memory loss and even premature death, so it's important to recognize the warning signals. Talk to your doctor about ways to manage stress if you're experiencing any of these symptoms: Prolonged periods of poor sleep. Regular, severe headaches. Unexplained weight loss or gain. Feelings of isolation, withdrawal or worthlessness. Constant anger and irritability. Loss of interest in activities. Constant worrying or obsessive thinking. Excessive alcohol or drug use. Inability to concentrate.  10 Ways to Cope with Chronic Stress It's key to recognize stressful situations as they occur because it allows you to focus on managing how you react. We all need to know when to close our eyes and take a deep breath when we feel tension rising. Use these tips to prevent or reduce chronic stress. 1. Rebalance Work and Home All work and no play? If you're spending too much time at the office, intentionally put more dates in your calendar to enjoy time for fun, either alone or with others. 2. Get Regular Exercise Moving your body on a regular basis balances the nervous system and increases blood circulation, helping to flush out stress hormones. Even a daily 20-minute walk makes a difference. Any kind of exercise can lower stress and improve your mood ? just pick activities that you enjoy and make it a regular habit. 3. Eat Well and Limit Alcohol and  Stimulants Alcohol, nicotine and caffeine may temporarily relieve stress but have negative health impacts and can make stress worse in the long run. Well-nourished bodies cope better, so start with a good breakfast, add more organic fruits and vegetables for a well-balanced diet, avoid processed foods and sugar, try herbal tea and drink more water. 4. Connect with Supportive People Talking face to face with another person releases hormones that reduce stress. Lean on those good listeners in your life. 5. [place] Time Do you enjoy gardening, reading, listening to music or some other creative pursuit? Engage in activities that bring you pleasure and  joy; research shows that reduces stress by almost half and lowers your heart rate, too. 6. Practice Meditation, Stress Reduction or Yoga Relaxation techniques activate a state of restfulness that counterbalances your body's fight-or-flight hormones. Even if this also means a 10-minute break in a long day: listen to music, read, go for a walk in nature, do a hobby, take a bath or spend time with a friend. Also consider doing a mindfulness exercise or try a daily deep breathing or imagery practice. Deep Breathing Slow, calm and deep breathing can help you relax. Try these steps to focus on your breathing and repeat as needed. Find a comfortable position and close your eyes. Exhale and drop your shoulders. Breathe in through your nose; fill your lungs and then your belly. Think of relaxing your body, quieting your mind and becoming calm and peaceful. Breathe out slowly through your nose, relaxing your belly. Think of releasing tension, pain, worries or distress. Repeat steps three and four until you feel relaxed. Imagery This involves using your mind to excite the senses -- sound, vision, smell, taste and feeling. This may help ease your stress. Begin by getting comfortable and then do some slow breathing. Imagine a place you love being at. It could  be somewhere from your childhood, somewhere you vacationed or just a place in your imagination. Feel how it is to be in the place you're imagining. Pay attention to the sounds, air, colors, and who is there with you. This is a place where you feel cared for and loved. All is well. You are safe. Take in all the smells, sounds, tastes and feelings. As you do, feel your body being nourished and healed. Feel the calm that surrounds you. Breathe in all the good. Breathe out any discomfort or tension. 7. Sleep Enough If you get less than seven to eight hours of sleep, your body won't tolerate stress as well as it could. If stress keeps you up at night, address the cause, and add extra meditation into your day to make up for the lost z's. Try to get seven to nine hours of sleep each night. Make a regular bedtime schedule. Keep your room dark and cool. Try to avoid computers, TV, cell phones and tablets before bed. 8. Bond with Connections You Enjoy Go out for a coffee with a friend, chat with a neighbor, call a family member, visit with a clergy member, or even hang out with your pet. Clinical studies show that spending even a short time with a companion animal can cut anxiety levels almost in half. 9. Take a Vacation Getting away from it all can reset your stress tolerance by increasing your mental and emotional outlook, which makes you a happier, more productive person upon return. Leave your cellphone and laptop at home! 10. See a Counselor, Coach or Therapist If negative thoughts overwhelm your ability to make positive changes, it's time to seek professional help. Make an appointment today--your health and life are worth it."  If you are experiencing a Mental Health or [place] or need someone to talk to, please call the Suicide and Crisis Lifeline: 988    Patient Goals: Initial goal       08/07/2022   10:24 AM 08/03/2022    2:52 PM 07/02/2022    4:31 PM 05/26/2022    3:45 PM  10/15/2021    2:17 PM  Depression screen PHQ 2/9  Decreased Interest 2 2 0 0 0  Down, Depressed, Hopeless 1 1  0 0 0  PHQ - 2 Score 3 3 0 0 0  Altered sleeping _0 0  Tired, decreased energy 2 1 0 1 0  Change in appetite _1 0   Feeling bad or failure about yourself  0 0 0 0 0  Trouble concentrating 0 0 0 0 0  Moving slowly or fidgety/restless 0 0 0 0 0  Suicidal thoughts 0 0 0 0 0  PHQ-9 Score _2 0  Difficult doing work/chores Somewhat difficult Not difficult at all   Not difficult at all    Follow up:  Patient agrees to Care Plan and Follow-up.  Plan: The Managed Medicaid care management team will reach out to the patient again over the next 30 days.  Date/time of next scheduled Social Work care management/care coordination outreach:  08/14/22 AT [place], BSW, MSW, [employer] Managed Medicaid LCSW [email or web address] Phone: [phone]

## 2022-08-07 NOTE — Patient Instructions (Signed)
Visit Information  Hannah Fleming was given information about Medicaid Managed Care team care coordination services as a part of their [place] Medicaid benefit. Hannah Fleming verbally consented to engagement with the [facility] Managed Care team.   If you are experiencing a medical emergency, please call 911 or report to your local emergency department or urgent care.   If you have a non-emergency medical problem during routine business hours, please contact your provider's office and ask to speak with a nurse.   For questions related to your [facility], please call: [phone] or visit the homepage here: [email or web address]  If you would like to schedule transportation through your [facility], please call the following number at least 2 days in advance of your appointment: [phone]   Rides for urgent appointments can also be made after hours by calling Member Services.  Call the [facility] at [phone], at any time, 24 hours a day, 7 days a week. If you are in danger or need immediate medical attention call 911.  If you would like help to quit smoking, call 1-800-QUIT-NOW (669) 045-3868) OR Espaol: 1-855-Djelo-Ya (4-709-628-3662) o para ms informacin haga clic aqu or Text READY to 200-400 to register via text   Following is a copy of your plan of care:  Care Plan : LCSW Plan of Care  Updates made by Greg Cutter, LCSW since 08/07/2022 12:00 AM     Problem: Depression Identification (Depression)      Long-Range Goal: Depressive Symptoms Identified   Start Date: 08/07/2022  Note:   Priority: High  Timeframe:  Long-Range Goal Priority:  High Start Date:   08/07/22             Expected End Date:  ongoing                     Follow Up Date--08/14/22 at 1015 am  - keep 90 percent of scheduled appointments -consider  counseling or psychiatry -consider bumping up your self-care  -consider creating a stronger support network   Why is this important?             Combatting depression may take some time.            If you don't feel better right away, don't give up on your treatment plan.    Current barriers:   Chronic Mental Health needs related to depression, stress and anxiety. Patient requires Support, Education, Resources, Referrals, Advocacy, and Care Coordination, in order to meet Unmet Mental Health Needs. Patient will implement clinical interventions discussed today to decrease symptoms of depression and increase knowledge and/or ability of: coping skills. Mental Health Concerns and Social Isolation Patient lacks knowledge of available community counseling agencies and resources.  Clinical Goal(s): verbalize understanding of plan for management of Anxiety, Depression, and Stress and demonstrate a reduction in symptoms. Patient will connect with a provider for ongoing mental health treatment, increase coping skills, healthy habits, self-management skills, and stress reduction        Patient Goals/Self-Care Activities: Over the next 120 days Attend scheduled medical appointments Utilize healthy coping skills and supportive resources discussed Contact PCP with any questions or concerns Keep 90 percent of counseling appointments Call your insurance provider for more information about your Enhanced Benefits  Check out counseling resources provided  Begin personal counseling with LCSW, to reduce and manage symptoms of Depression and Stress, until well-established with mental health provider Accept  all calls from representative with [facility] in an effort to establish ongoing mental health counseling and supportive services. Incorporate into daily practice - relaxation techniques, deep breathing exercises, and mindfulness meditation strategies. Talk about feelings with friends, family members, spiritual  advisor, etc. Contact LCSW directly [phone]), if you have questions, need assistance, or if additional social work needs are identified between now and our next scheduled telephone outreach call. Call [phone] for mental health hotline/crisis line if needed (24/7 available) Try techniques to reduce symptoms of anxiety/negative thinking (deep breathing, distraction, positive self talk, etc)  - develop a personal safety plan - develop a plan to deal with triggers like holidays, anniversaries - exercise at least 2 to 3 times per week - have a plan for how to handle bad days - journal feelings and what helps to feel better or worse - spend time or talk with others at least 2 to 3 times per week - watch for early signs of feeling worse - begin personal counseling - call and visit an old friend - check out volunteer opportunities - join a support group - laugh; watch a funny movie or comedian - learn and use visualization or guided imagery - perform a random act of kindness - practice relaxation or meditation daily - start or continue a personal journal - practice positive thinking and self-talk -continue with compliance of taking medication  -identify current effective and ineffective coping strategies.  -implement positive self-talk in care to increase self-esteem, confidence and feelings of control.  -consider alternative and complementary therapy approaches such as meditation, mindfulness or yoga.  -journaling, prayer, worship services, meditation or pastoral counseling.  -increase participation in pleasurable group activities such as hobbies, singing, sports or volunteering).  -consider the use of meditative movement therapy such as tai chi, yoga or qigong.  -start a regular daily exercise program based on tolerance, ability and patient choice to support positive thinking and activity    10 LITTLE Things To Do When You're Feeling Too Down To Do Anything  Take a shower. Even if you  plan to stay in all day long and not see a soul, take a shower. It takes the most effort to hop in to the shower but once you do, you'll feel immediate results. It will wake you up and you'll be feeling much fresher (and cleaner too).  Brush and floss your teeth. Give your teeth a good brushing with a floss finish. It's a small task but it feels so good and you can check 'taking care of your health' off the list of things to do.  Do something small on your list. Most of [place] have some small thing we would like to get done (load of laundry, sew a button, email a friend). Doing one of these things will make you feel like you've accomplished something.  Drink water. Drinking water is easy right? It's also really beneficial for your health so keep a glass beside you all day and take sips often. It gives you energy and prevents you from boredom eating.  Do some floor exercises. The last thing you want to do is exercise but it might be just the thing you need the most. Keep it simple and do exercises that involve sitting or laying on the floor. Even the smallest of exercises release chemicals in the brain that make you feel good. Yoga stretches or core exercises are going to make you feel good with minimal effort.  Make your bed. Making your bed takes a  few minutes but it's productive and you'll feel relieved when it's done. An unmade bed is a huge visual reminder that you're having an unproductive day. Do it and consider it your housework for the day.  Put on some nice clothes. Take the sweatpants off even if you don't plan to go anywhere. Put on clothes that make you feel good. Take a look in the mirror so your brain recognizes the sweatpants have been replaced with clothes that make you look great. It's an instant confidence booster.  Wash the dishes. A pile of dirty dishes in the sink is a reflection of your mood. It's possible that if you wash up the dishes, your mood will follow suit. It's worth a  try.  Cook a real meal. If you have the luxury to have a "do nothing" day, you have time to make a real meal for yourself. Make a meal that you love to eat. The process is good to get you out of the funk and the food will ensure you have more energy for tomorrow.  Write out your thoughts by hand. When you hand write, you stimulate your brain to focus on the moment that you're in so make yourself comfortable and write whatever comes into your mind. Put those thoughts out on paper so they stop spinning around in your head. Those thoughts might be the very thing holding you down.  The following coping skill education was provided for stress relief and mental health management: "When your car dies or a deadline looms, how do you respond? Long-term, low-grade or acute stress takes a serious toll on your body and mind, so don't ignore feelings of constant tension. Stress is a natural part of life. However, too much stress can harm our health, especially if it continues every day. This is chronic stress and can put you at risk for heart problems like heart disease and depression. Understand what's happening inside your body and learn simple coping skills to combat the negative impacts of everyday stressors.  Types of Stress There are two types of stress: Emotional - types of emotional stress are relationship problems, pressure at work, financial worries, experiencing discrimination or having a major life change. Physical - Examples of physical stress include being sick having pain, not sleeping well, recovery from an injury or having an alcohol and drug use disorder. Fight or Flight Sudden or ongoing stress activates your nervous system and floods your bloodstream with adrenaline and cortisol, two hormones that raise blood pressure, increase heart rate and spike blood sugar. These changes pitch your body into a fight or flight response. That enabled our ancestors to outrun saber-toothed tigers, and it's  helpful today for situations like dodging a car accident. But most modern chronic stressors, such as finances or a challenging relationship, keep your body in that heightened state, which hurts your health. Effects of Too Much Stress If constantly under stress, most of [place] will eventually start to function less well.  Multiple studies link chronic stress to a higher risk of heart disease, stroke, depression, weight gain, memory loss and even premature death, so it's important to recognize the warning signals. Talk to your doctor about ways to manage stress if you're experiencing any of these symptoms: Prolonged periods of poor sleep. Regular, severe headaches. Unexplained weight loss or gain. Feelings of isolation, withdrawal or worthlessness. Constant anger and irritability. Loss of interest in activities. Constant worrying or obsessive thinking. Excessive alcohol or drug use. Inability to concentrate.  10 Ways  to Cope with Chronic Stress It's key to recognize stressful situations as they occur because it allows you to focus on managing how you react. We all need to know when to close our eyes and take a deep breath when we feel tension rising. Use these tips to prevent or reduce chronic stress. 1. Rebalance Work and Home All work and no play? If you're spending too much time at the office, intentionally put more dates in your calendar to enjoy time for fun, either alone or with others. 2. Get Regular Exercise Moving your body on a regular basis balances the nervous system and increases blood circulation, helping to flush out stress hormones. Even a daily 20-minute walk makes a difference. Any kind of exercise can lower stress and improve your mood ? just pick activities that you enjoy and make it a regular habit. 3. Eat Well and Limit Alcohol and Stimulants Alcohol, nicotine and caffeine may temporarily relieve stress but have negative health impacts and can make stress worse in the long run.  Well-nourished bodies cope better, so start with a good breakfast, add more organic fruits and vegetables for a well-balanced diet, avoid processed foods and sugar, try herbal tea and drink more water. 4. Connect with Supportive People Talking face to face with another person releases hormones that reduce stress. Lean on those good listeners in your life. 5. Catherine Time Do you enjoy gardening, reading, listening to music or some other creative pursuit? Engage in activities that bring you pleasure and joy; research shows that reduces stress by almost half and lowers your heart rate, too. 6. Practice Meditation, Stress Reduction or Yoga Relaxation techniques activate a state of restfulness that counterbalances your body's fight-or-flight hormones. Even if this also means a 10-minute break in a long day: listen to music, read, go for a walk in nature, do a hobby, take a bath or spend time with a friend. Also consider doing a mindfulness exercise or try a daily deep breathing or imagery practice. Deep Breathing Slow, calm and deep breathing can help you relax. Try these steps to focus on your breathing and repeat as needed. Find a comfortable position and close your eyes. Exhale and drop your shoulders. Breathe in through your nose; fill your lungs and then your belly. Think of relaxing your body, quieting your mind and becoming calm and peaceful. Breathe out slowly through your nose, relaxing your belly. Think of releasing tension, pain, worries or distress. Repeat steps three and four until you feel relaxed. Imagery This involves using your mind to excite the senses -- sound, vision, smell, taste and feeling. This may help ease your stress. Begin by getting comfortable and then do some slow breathing. Imagine a place you love being at. It could be somewhere from your childhood, somewhere you vacationed or just a place in your imagination. Feel how it is to be in the place you're imagining.  Pay attention to the sounds, air, colors, and who is there with you. This is a place where you feel cared for and loved. All is well. You are safe. Take in all the smells, sounds, tastes and feelings. As you do, feel your body being nourished and healed. Feel the calm that surrounds you. Breathe in all the good. Breathe out any discomfort or tension. 7. Sleep Enough If you get less than seven to eight hours of sleep, your body won't tolerate stress as well as it could. If stress keeps you up at night, address the  cause, and add extra meditation into your day to make up for the lost z's. Try to get seven to nine hours of sleep each night. Make a regular bedtime schedule. Keep your room dark and cool. Try to avoid computers, TV, cell phones and tablets before bed. 8. Bond with Connections You Enjoy Go out for a coffee with a friend, chat with a neighbor, call a family member, visit with a clergy member, or even hang out with your pet. Clinical studies show that spending even a short time with a companion animal can cut anxiety levels almost in half. 9. Take a Vacation Getting away from it all can reset your stress tolerance by increasing your mental and emotional outlook, which makes you a happier, more productive person upon return. Leave your cellphone and laptop at home! 10. See a Counselor, Coach or Therapist If negative thoughts overwhelm your ability to make positive changes, it's time to seek professional help. Make an appointment today--your health and life are worth it."  If you are experiencing a Mental Health or [place] or need someone to talk to, please call the Suicide and Crisis Lifeline: 988    Patient Goals: Initial goal

## 2022-08-12 ENCOUNTER — Ambulatory Visit: Payer: Medicaid Other

## 2022-08-14 ENCOUNTER — Other Ambulatory Visit: Payer: Medicaid Other | Admitting: Licensed Clinical Social Worker

## 2022-08-14 NOTE — Patient Instructions (Signed)
Visit Information  Hannah Fleming was given information about Medicaid Managed Care team care coordination services as a part of their [place] Medicaid benefit. Hannah Fleming verbally consented to engagement with the [facility] Managed Care team.   If you are experiencing a medical emergency, please call 911 or report to your local emergency department or urgent care.   If you have a non-emergency medical problem during routine business hours, please contact your provider's office and ask to speak with a nurse.   For questions related to your [facility], please call: [phone] or visit the homepage here: [email or web address]  If you would like to schedule transportation through your [facility], please call the following number at least 2 days in advance of your appointment: [phone]   Rides for urgent appointments can also be made after hours by calling Member Services.  Call the [facility] at [phone], at any time, 24 hours a day, 7 days a week. If you are in danger or need immediate medical attention call 911.  If you would like help to quit smoking, call 1-800-QUIT-NOW 864-530-0482) OR Espaol: 1-855-Djelo-Ya (3-664-403-4742) o para ms informacin haga clic aqu or Text READY to 200-400 to register via text  Following is a copy of your plan of care:  Care Plan : LCSW Plan of Care  Updates made by Hannah Cutter, LCSW since 08/14/2022 12:00 AM     Problem: Depression Identification (Depression)      Long-Range Goal: Depressive Symptoms Identified   Start Date: 08/07/2022  Note:   Priority: High  Timeframe:  Long-Range Goal Priority:  High Start Date:   08/07/22             Expected End Date:  ongoing                     Follow Up Date--08/31/22 at 75 am  - keep 90 percent of scheduled appointments -consider  counseling or psychiatry -consider bumping up your self-care  -consider creating a stronger support network   Why is this important?             Combatting depression may take some time.            If you don't feel better right away, don't give up on your treatment plan.    Current barriers:   Chronic Mental Health needs related to depression, stress and anxiety. Patient requires Support, Education, Resources, Referrals, Advocacy, and Care Coordination, in order to meet Unmet Mental Health Needs. Patient will implement clinical interventions discussed today to decrease symptoms of depression and increase knowledge and/or ability of: coping skills. Mental Health Concerns and Social Isolation Patient lacks knowledge of available community counseling agencies and resources.  Clinical Goal(s): verbalize understanding of plan for management of Anxiety, Depression, and Stress and demonstrate a reduction in symptoms. Patient will connect with a provider for ongoing mental health treatment, increase coping skills, healthy habits, self-management skills, and stress reduction        Patient Goals/Self-Care Activities: Over the next 120 days Attend scheduled medical appointments Utilize healthy coping skills and supportive resources discussed Contact PCP with any questions or concerns Keep 90 percent of counseling appointments Call your insurance provider for more information about your Enhanced Benefits  Check out counseling resources provided  Begin personal counseling with LCSW, to reduce and manage symptoms of Depression and Stress, until well-established with mental health provider Accept all  calls from representative with [facility] in an effort to establish ongoing mental health counseling and supportive services. Incorporate into daily practice - relaxation techniques, deep breathing exercises, and mindfulness meditation strategies. Talk about feelings with friends, family members, spiritual  advisor, etc. Contact LCSW directly [phone]), if you have questions, need assistance, or if additional social work needs are identified between now and our next scheduled telephone outreach call. Call [phone] for mental health hotline/crisis line if needed (24/7 available) Try techniques to reduce symptoms of anxiety/negative thinking (deep breathing, distraction, positive self talk, etc)  - develop a personal safety plan - develop a plan to deal with triggers like holidays, anniversaries - exercise at least 2 to 3 times per week - have a plan for how to handle bad days - journal feelings and what helps to feel better or worse - spend time or talk with others at least 2 to 3 times per week - watch for early signs of feeling worse - begin personal counseling - call and visit an old friend - check out volunteer opportunities - join a support group - laugh; watch a funny movie or comedian - learn and use visualization or guided imagery - perform a random act of kindness - practice relaxation or meditation daily - start or continue a personal journal - practice positive thinking and self-talk -continue with compliance of taking medication  -identify current effective and ineffective coping strategies.  -implement positive self-talk in care to increase self-esteem, confidence and feelings of control.  -consider alternative and complementary therapy approaches such as meditation, mindfulness or yoga.  -journaling, prayer, worship services, meditation or pastoral counseling.  -increase participation in pleasurable group activities such as hobbies, singing, sports or volunteering).  -consider the use of meditative movement therapy such as tai chi, yoga or qigong.  -start a regular daily exercise program based on tolerance, ability and patient choice to support positive thinking and activity    10 LITTLE Things To Do When You're Feeling Too Down To Do Anything  Take a shower. Even if you  plan to stay in all day long and not see a soul, take a shower. It takes the most effort to hop in to the shower but once you do, you'll feel immediate results. It will wake you up and you'll be feeling much fresher (and cleaner too).  Brush and floss your teeth. Give your teeth a good brushing with a floss finish. It's a small task but it feels so good and you can check 'taking care of your health' off the list of things to do.  Do something small on your list. Most of [place] have some small thing we would like to get done (load of laundry, sew a button, email a friend). Doing one of these things will make you feel like you've accomplished something.  Drink water. Drinking water is easy right? It's also really beneficial for your health so keep a glass beside you all day and take sips often. It gives you energy and prevents you from boredom eating.  Do some floor exercises. The last thing you want to do is exercise but it might be just the thing you need the most. Keep it simple and do exercises that involve sitting or laying on the floor. Even the smallest of exercises release chemicals in the brain that make you feel good. Yoga stretches or core exercises are going to make you feel good with minimal effort.  Make your bed. Making your bed takes a few  minutes but it's productive and you'll feel relieved when it's done. An unmade bed is a huge visual reminder that you're having an unproductive day. Do it and consider it your housework for the day.  Put on some nice clothes. Take the sweatpants off even if you don't plan to go anywhere. Put on clothes that make you feel good. Take a look in the mirror so your brain recognizes the sweatpants have been replaced with clothes that make you look great. It's an instant confidence booster.  Wash the dishes. A pile of dirty dishes in the sink is a reflection of your mood. It's possible that if you wash up the dishes, your mood will follow suit. It's worth a  try.  Cook a real meal. If you have the luxury to have a "do nothing" day, you have time to make a real meal for yourself. Make a meal that you love to eat. The process is good to get you out of the funk and the food will ensure you have more energy for tomorrow.  Write out your thoughts by hand. When you hand write, you stimulate your brain to focus on the moment that you're in so make yourself comfortable and write whatever comes into your mind. Put those thoughts out on paper so they stop spinning around in your head. Those thoughts might be the very thing holding you down.  The following coping skill education was provided for stress relief and mental health management: "When your car dies or a deadline looms, how do you respond? Long-term, low-grade or acute stress takes a serious toll on your body and mind, so don't ignore feelings of constant tension. Stress is a natural part of life. However, too much stress can harm our health, especially if it continues every day. This is chronic stress and can put you at risk for heart problems like heart disease and depression. Understand what's happening inside your body and learn simple coping skills to combat the negative impacts of everyday stressors.  Types of Stress There are two types of stress: Emotional - types of emotional stress are relationship problems, pressure at work, financial worries, experiencing discrimination or having a major life change. Physical - Examples of physical stress include being sick having pain, not sleeping well, recovery from an injury or having an alcohol and drug use disorder. Fight or Flight Sudden or ongoing stress activates your nervous system and floods your bloodstream with adrenaline and cortisol, two hormones that raise blood pressure, increase heart rate and spike blood sugar. These changes pitch your body into a fight or flight response. That enabled our ancestors to outrun saber-toothed tigers, and it's  helpful today for situations like dodging a car accident. But most modern chronic stressors, such as finances or a challenging relationship, keep your body in that heightened state, which hurts your health. Effects of Too Much Stress If constantly under stress, most of [place] will eventually start to function less well.  Multiple studies link chronic stress to a higher risk of heart disease, stroke, depression, weight gain, memory loss and even premature death, so it's important to recognize the warning signals. Talk to your doctor about ways to manage stress if you're experiencing any of these symptoms: Prolonged periods of poor sleep. Regular, severe headaches. Unexplained weight loss or gain. Feelings of isolation, withdrawal or worthlessness. Constant anger and irritability. Loss of interest in activities. Constant worrying or obsessive thinking. Excessive alcohol or drug use. Inability to concentrate.  10 Ways to  Cope with Chronic Stress It's key to recognize stressful situations as they occur because it allows you to focus on managing how you react. We all need to know when to close our eyes and take a deep breath when we feel tension rising. Use these tips to prevent or reduce chronic stress. 1. Rebalance Work and Home All work and no play? If you're spending too much time at the office, intentionally put more dates in your calendar to enjoy time for fun, either alone or with others. 2. Get Regular Exercise Moving your body on a regular basis balances the nervous system and increases blood circulation, helping to flush out stress hormones. Even a daily 20-minute walk makes a difference. Any kind of exercise can lower stress and improve your mood ? just pick activities that you enjoy and make it a regular habit. 3. Eat Well and Limit Alcohol and Stimulants Alcohol, nicotine and caffeine may temporarily relieve stress but have negative health impacts and can make stress worse in the long run.  Well-nourished bodies cope better, so start with a good breakfast, add more organic fruits and vegetables for a well-balanced diet, avoid processed foods and sugar, try herbal tea and drink more water. 4. Connect with Supportive People Talking face to face with another person releases hormones that reduce stress. Lean on those good listeners in your life. 5. [place] Time Do you enjoy gardening, reading, listening to music or some other creative pursuit? Engage in activities that bring you pleasure and joy; research shows that reduces stress by almost half and lowers your heart rate, too. 6. Practice Meditation, Stress Reduction or Yoga Relaxation techniques activate a state of restfulness that counterbalances your body's fight-or-flight hormones. Even if this also means a 10-minute break in a long day: listen to music, read, go for a walk in nature, do a hobby, take a bath or spend time with a friend. Also consider doing a mindfulness exercise or try a daily deep breathing or imagery practice. Deep Breathing Slow, calm and deep breathing can help you relax. Try these steps to focus on your breathing and repeat as needed. Find a comfortable position and close your eyes. Exhale and drop your shoulders. Breathe in through your nose; fill your lungs and then your belly. Think of relaxing your body, quieting your mind and becoming calm and peaceful. Breathe out slowly through your nose, relaxing your belly. Think of releasing tension, pain, worries or distress. Repeat steps three and four until you feel relaxed. Imagery This involves using your mind to excite the senses -- sound, vision, smell, taste and feeling. This may help ease your stress. Begin by getting comfortable and then do some slow breathing. Imagine a place you love being at. It could be somewhere from your childhood, somewhere you vacationed or just a place in your imagination. Feel how it is to be in the place you're imagining.  Pay attention to the sounds, air, colors, and who is there with you. This is a place where you feel cared for and loved. All is well. You are safe. Take in all the smells, sounds, tastes and feelings. As you do, feel your body being nourished and healed. Feel the calm that surrounds you. Breathe in all the good. Breathe out any discomfort or tension. 7. Sleep Enough If you get less than seven to eight hours of sleep, your body won't tolerate stress as well as it could. If stress keeps you up at night, address the cause,  and add extra meditation into your day to make up for the lost z's. Try to get seven to nine hours of sleep each night. Make a regular bedtime schedule. Keep your room dark and cool. Try to avoid computers, TV, cell phones and tablets before bed. 8. Bond with Connections You Enjoy Go out for a coffee with a friend, chat with a neighbor, call a family member, visit with a clergy member, or even hang out with your pet. Clinical studies show that spending even a short time with a companion animal can cut anxiety levels almost in half. 9. Take a Vacation Getting away from it all can reset your stress tolerance by increasing your mental and emotional outlook, which makes you a happier, more productive person upon return. Leave your cellphone and laptop at home! 10. See a Counselor, Coach or Therapist If negative thoughts overwhelm your ability to make positive changes, it's time to seek professional help. Make an appointment today--your health and life are worth it."  If you are experiencing a Mental Health or [place] or need someone to talk to, please call the Suicide and Crisis Lifeline: 988    Patient Goals: Follow up goal  Hannah Fleming, BSW, MSW, [employer] Managed Medicaid LCSW [email or web address] Phone: [phone]

## 2022-08-14 NOTE — Patient Outreach (Signed)
Medicaid Managed Care Social Work Note  08/14/2022 Name:  Hannah Fleming MRN:  [id number] DOB:  [birth year]  Hannah Fleming is an 41 y.o. year old female who is a primary patient of [name], MD.  The Medicaid Managed Care Coordination team was consulted for assistance with:  [place] [facility]  Ms. Calandra was given information about Medicaid Managed Care Coordination team services today. Hannah Fleming Patient agreed to services and verbal consent obtained.  Engaged with patient  for by telephone forfollow up visit in response to referral for case management and/or care coordination services.   Assessments/Interventions:  Review of past medical history, allergies, medications, health status, including review of consultants reports, laboratory and other test data, was performed as part of comprehensive evaluation and provision of chronic care management services.  SDOH: (Social Determinant of Health) assessments and interventions performed: SDOH Interventions    Flowsheet Row Patient Outreach Telephone from 08/14/2022 in [place] Patient Outreach Telephone from 08/07/2022 in [place] Patient Outreach Telephone from 08/04/2022 in [place] Patient Outreach Telephone from 04/15/2021 in [place] Coordination  SDOH Interventions      Food Insecurity Interventions -- -- Intervention Not Indicated --  Housing Interventions -- -- -- --  [referred patient to [facility] BSW]  Transportation Interventions -- -- -- Intervention Not Indicated  Depression Interventions/Treatment  -- Counseling -- --  Financial Strain Interventions -- -- -- --  [referred to [facility] BSW]  Physical Activity Interventions -- -- -- Intervention Not Indicated  Stress Interventions Offered [employer], Provide Counseling Offered [employer],  Provide Counseling -- --       Advanced Directives Status:  See Care Plan for related entries.  Care Plan                 Allergies  Allergen Reactions   Prilosec Otc [Omeprazole Magnesium] Hives   Latex Itching and Rash    Medications Reviewed Today     Reviewed by Greg Cutter, LCSW (Social Worker) on 08/14/22 at 1117  Med List Status: <None>   Medication Order Taking? Sig Documenting Provider Last Dose Status Informant  acetaminophen (TYLENOL) 500 MG tablet [id number] No Take 1,000 mg by mouth every 6 (six) hours as needed for mild pain or moderate pain.  Patient not taking: Reported on 08/03/2022   [provider] Not Taking Active Self  ibuprofen (ADVIL) 600 MG tablet [id number] No Take 1 tablet (600 mg total) by mouth every 6 (six) hours as needed.  Patient not taking: Reported on 08/03/2022   Constant, Peggy, MD Not Taking Active   metroNIDAZOLE (FLAGYL) 500 MG tablet [id number] No Take 1 tablet (500 mg total) by mouth 2 (two) times daily.  Patient not taking: Reported on 08/04/2022   Constant, Peggy, MD Not Taking Active   metroNIDAZOLE 1.3 % GEL [id number]  Place 1 Applicatorful vaginally 2 (two) times daily. [name], MD  Active   mirtazapine (REMERON) 15 MG tablet [id number]  Take 1 tablet (15 mg total) by mouth at bedtime. [name], MD  Active   Norethindrone Acetate-Ethinyl Estrad-FE (LOESTRIN 24 FE) 1-20 MG-MCG(24) tablet [id number] No Take 1 tablet by mouth daily. Constant, Peggy, MD Taking Active   oxyCODONE-acetaminophen (PERCOCET/ROXICET) 5-325 MG tablet [id number] No Take 1 tablet by mouth every 6 (six) hours as needed.  Patient not taking: Reported on 08/03/2022   Constant, Vickii Chafe, MD Not  Taking Active   Prenat-Fe Carbonyl-FA-Omega 3 (ONE-A-DAY WOMENS PRENATAL 1) 28-0.8-235 MG CAPS [id number] No Take 1 capsule by mouth daily.  Patient not taking: Reported on 08/03/2022   [provider] Not Taking Active Self  promethazine (PHENERGAN)  25 MG tablet [id number] No Take 1 tablet (25 mg total) by mouth every 6 (six) hours as needed for nausea or vomiting.  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   Bacterial vaginitis 11/27/2020   Candida vaginitis 11/27/2020   Prediabetes 06/21/2019   Ear itch 03/30/2019   Tonsillar calculus 03/30/2019   Menorrhagia with regular cycle 12/28/2013   Anxiety 02/07/2013   Dysmenorrhea 02/07/2013    Conditions to be addressed/monitored per PCP order:  Depression  Care Plan : LCSW Plan of Care  Updates made by Greg Cutter, LCSW since 08/14/2022 12:00 AM     Problem: Depression Identification (Depression)      Long-Range Goal: Depressive Symptoms Identified   Start Date: 08/07/2022  Note:   Priority: High  Timeframe:  Long-Range Goal Priority:  High Start Date:   08/07/22             Expected End Date:  ongoing                     Follow Up Date--08/31/22 at 915 am  - keep 90 percent of scheduled appointments -consider counseling or psychiatry -consider bumping up your self-care  -consider creating a stronger support network   Why is this important?             Combatting depression may take some time.            If you don't feel better right away, don't give up on your treatment plan.    Current barriers:   Chronic Mental Health needs related to depression, stress and anxiety. Patient requires Support, Education, Resources, Referrals, Advocacy, and Care Coordination, in order to meet Unmet Mental Health Needs. Patient will implement clinical interventions discussed today to decrease symptoms of depression and increase knowledge and/or ability of: coping skills. Mental Health Concerns and Social  Isolation Patient lacks knowledge of available community counseling agencies and resources.  Clinical Goal(s): verbalize understanding of plan for management of Anxiety, Depression, and Stress and demonstrate a reduction in symptoms. Patient will connect with a provider for ongoing mental health treatment, increase coping skills, healthy habits, self-management skills, and stress reduction        Clinical Interventions:  Assessed patient's previous and current treatment, coping skills, support system and barriers to care. Patient provided hx Patient reports that she has had a lot of issues socially and personally and that has affected her mood. Patient is only interested in counseling at this time as PCP has prescribed her a new medication to help her mood. Verbalization of feelings encouraged, motivational interviewing employed Emotional support provided, positive coping strategies explored. Establishing healthy boundaries emphasized and healthy self-care education provided Patient was educated on available mental health resources within their area that accept Medicaid and offer counseling and psychiatry. Email sent to patient today with resource request- [facility]  [place], [place] [address] [phone]  [employer] Medicare,  [place], [place];  BCBS ; [facility] [place], [place] , [place] as well as instructions for scheduling at [facility] as well as some crisis support resources and [facility]'s walk in clinic hours Patient is agreeable to referral to [facility] for counseling ONLY. [facility] LCSW made referral on 08/07/22. LCSW provided education on relaxation techniques such as meditation, deep breathing, massage, grounding exercises or yoga that can activate the body's relaxation response and ease symptoms of stress and anxiety. LCSW ask that when pt is struggling with difficult emotions and racing thoughts that they start this relaxation response process. LCSW  provided extensive education on healthy coping skills for anxiety. SW used active and reflective listening, validated patient's feelings/concerns, and provided emotional support. Patient will work on implementing appropriate self-care habits into their daily routine such as: staying positive, writing a gratitude list, drinking water, staying active around the house, taking their medications as prescribed, combating negative thoughts or emotions and staying connected with their family and friends. Positive reinforcement provided for this decision to work on this. Motivational Interviewing employed Depression screen reviewed  PHQ2/ PHQ9 completed or reviewed  Mindfulness or Relaxation training provided Active listening / Reflection utilized  Advance Care and HCPOA education provided Emotional Support Provided Problem [place] strategies reviewed Provided psychoeducation for mental health needs  Provided brief CBT  Reviewed mental health medications and discussed importance of compliance:  Quality of sleep assessed & Sleep Hygiene techniques promoted  Participation in counseling encouraged  Verbalization of feelings encouraged  Suicidal Ideation/Homicidal Ideation assessed: Patient denies SI/HI  Review resources, discussed options and provided patient information about  [place] care team collaboration (see longitudinal plan of care) 08/15/23 update-  patient reports that her mood has been relatively more stable lately. She was unable to confirm if she received email that [facility] LCSW sent so [facility] LCSW sent additional email today and had patient write down contact information for [facility] in order for her to contact them to schedule her initial counseling session. Brief self-care education provided as well. [facility] LCSW will follow up after the new year.  Patient Goals/Self-Care Activities: Over the next 120 days Attend scheduled medical appointments Utilize  healthy coping skills and supportive resources discussed Contact PCP with any questions or concerns Keep 90 percent of counseling appointments Call your insurance provider for more information about your Enhanced Benefits  Check out counseling resources provided  Begin personal counseling with LCSW, to reduce and manage symptoms of Depression and Stress, until well-established with mental health provider Accept all calls from representative with [facility] in an effort to establish ongoing mental health counseling and supportive services. Incorporate into daily practice - relaxation techniques, deep breathing exercises, and mindfulness meditation strategies. Talk about feelings with friends, family members, spiritual advisor, etc. Contact LCSW directly [phone]), if you have questions, need assistance, or if additional social work needs are identified between now and our next scheduled telephone outreach call. Call [phone] for mental health hotline/crisis line if needed (24/7 available) Try techniques to reduce symptoms of anxiety/negative thinking (deep breathing, distraction, positive self talk, etc)  - develop a personal safety plan - develop a plan to deal with triggers like holidays, anniversaries - exercise at least 2 to 3 times per week - have a plan for how to handle bad days - journal feelings and what helps to feel better or worse - spend time or talk with others at least 2 to 3 times per week - watch for early  signs of feeling worse - begin personal counseling - call and visit an old friend - check out volunteer opportunities - join a support group - laugh; watch a funny movie or comedian - learn and use visualization or guided imagery - perform a random act of kindness - practice relaxation or meditation daily - start or continue a personal journal - practice positive thinking and self-talk -continue with compliance of taking medication  -identify current effective and  ineffective coping strategies.  -implement positive self-talk in care to increase self-esteem, confidence and feelings of control.  -consider alternative and complementary therapy approaches such as meditation, mindfulness or yoga.  -journaling, prayer, worship services, meditation or pastoral counseling.  -increase participation in pleasurable group activities such as hobbies, singing, sports or volunteering).  -consider the use of meditative movement therapy such as tai chi, yoga or qigong.  -start a regular daily exercise program based on tolerance, ability and patient choice to support positive thinking and activity    10 LITTLE Things To Do When You're Feeling Too Down To Do Anything  Take a shower. Even if you plan to stay in all day long and not see a soul, take a shower. It takes the most effort to hop in to the shower but once you do, you'll feel immediate results. It will wake you up and you'll be feeling much fresher (and cleaner too).  Brush and floss your teeth. Give your teeth a good brushing with a floss finish. It's a small task but it feels so good and you can check 'taking care of your health' off the list of things to do.  Do something small on your list. Most of [place] have some small thing we would like to get done (load of laundry, sew a button, email a friend). Doing one of these things will make you feel like you've accomplished something.  Drink water. Drinking water is easy right? It's also really beneficial for your health so keep a glass beside you all day and take sips often. It gives you energy and prevents you from boredom eating.  Do some floor exercises. The last thing you want to do is exercise but it might be just the thing you need the most. Keep it simple and do exercises that involve sitting or laying on the floor. Even the smallest of exercises release chemicals in the brain that make you feel good. Yoga stretches or core exercises are going to make you feel  good with minimal effort.  Make your bed. Making your bed takes a few minutes but it's productive and you'll feel relieved when it's done. An unmade bed is a huge visual reminder that you're having an unproductive day. Do it and consider it your housework for the day.  Put on some nice clothes. Take the sweatpants off even if you don't plan to go anywhere. Put on clothes that make you feel good. Take a look in the mirror so your brain recognizes the sweatpants have been replaced with clothes that make you look great. It's an instant confidence booster.  Wash the dishes. A pile of dirty dishes in the sink is a reflection of your mood. It's possible that if you wash up the dishes, your mood will follow suit. It's worth a try.  Cook a real meal. If you have the luxury to have a "do nothing" day, you have time to make a real meal for yourself. Make a meal that you love to eat. The process is good  to get you out of the funk and the food will ensure you have more energy for tomorrow.  Write out your thoughts by hand. When you hand write, you stimulate your brain to focus on the moment that you're in so make yourself comfortable and write whatever comes into your mind. Put those thoughts out on paper so they stop spinning around in your head. Those thoughts might be the very thing holding you down.  The following coping skill education was provided for stress relief and mental health management: "When your car dies or a deadline looms, how do you respond? Long-term, low-grade or acute stress takes a serious toll on your body and mind, so don't ignore feelings of constant tension. Stress is a natural part of life. However, too much stress can harm our health, especially if it continues every day. This is chronic stress and can put you at risk for heart problems like heart disease and depression. Understand what's happening inside your body and learn simple coping skills to combat the negative impacts of  everyday stressors.  Types of Stress There are two types of stress: Emotional - types of emotional stress are relationship problems, pressure at work, financial worries, experiencing discrimination or having a major life change. Physical - Examples of physical stress include being sick having pain, not sleeping well, recovery from an injury or having an alcohol and drug use disorder. Fight or Flight Sudden or ongoing stress activates your nervous system and floods your bloodstream with adrenaline and cortisol, two hormones that raise blood pressure, increase heart rate and spike blood sugar. These changes pitch your body into a fight or flight response. That enabled our ancestors to outrun saber-toothed tigers, and it's helpful today for situations like dodging a car accident. But most modern chronic stressors, such as finances or a challenging relationship, keep your body in that heightened state, which hurts your health. Effects of Too Much Stress If constantly under stress, most of [place] will eventually start to function less well.  Multiple studies link chronic stress to a higher risk of heart disease, stroke, depression, weight gain, memory loss and even premature death, so it's important to recognize the warning signals. Talk to your doctor about ways to manage stress if you're experiencing any of these symptoms: Prolonged periods of poor sleep. Regular, severe headaches. Unexplained weight loss or gain. Feelings of isolation, withdrawal or worthlessness. Constant anger and irritability. Loss of interest in activities. Constant worrying or obsessive thinking. Excessive alcohol or drug use. Inability to concentrate.  10 Ways to Cope with Chronic Stress It's key to recognize stressful situations as they occur because it allows you to focus on managing how you react. We all need to know when to close our eyes and take a deep breath when we feel tension rising. Use these tips to prevent or  reduce chronic stress. 1. Rebalance Work and Home All work and no play? If you're spending too much time at the office, intentionally put more dates in your calendar to enjoy time for fun, either alone or with others. 2. Get Regular Exercise Moving your body on a regular basis balances the nervous system and increases blood circulation, helping to flush out stress hormones. Even a daily 20-minute walk makes a difference. Any kind of exercise can lower stress and improve your mood ? just pick activities that you enjoy and make it a regular habit. 3. Eat Well and Limit Alcohol and Stimulants Alcohol, nicotine and caffeine may temporarily relieve stress  but have negative health impacts and can make stress worse in the long run. Well-nourished bodies cope better, so start with a good breakfast, add more organic fruits and vegetables for a well-balanced diet, avoid processed foods and sugar, try herbal tea and drink more water. 4. Connect with Supportive People Talking face to face with another person releases hormones that reduce stress. Lean on those good listeners in your life. 5. [place] Time Do you enjoy gardening, reading, listening to music or some other creative pursuit? Engage in activities that bring you pleasure and joy; research shows that reduces stress by almost half and lowers your heart rate, too. 6. Practice Meditation, Stress Reduction or Yoga Relaxation techniques activate a state of restfulness that counterbalances your body's fight-or-flight hormones. Even if this also means a 10-minute break in a long day: listen to music, read, go for a walk in nature, do a hobby, take a bath or spend time with a friend. Also consider doing a mindfulness exercise or try a daily deep breathing or imagery practice. Deep Breathing Slow, calm and deep breathing can help you relax. Try these steps to focus on your breathing and repeat as needed. Find a comfortable position and close your  eyes. Exhale and drop your shoulders. Breathe in through your nose; fill your lungs and then your belly. Think of relaxing your body, quieting your mind and becoming calm and peaceful. Breathe out slowly through your nose, relaxing your belly. Think of releasing tension, pain, worries or distress. Repeat steps three and four until you feel relaxed. Imagery This involves using your mind to excite the senses -- sound, vision, smell, taste and feeling. This may help ease your stress. Begin by getting comfortable and then do some slow breathing. Imagine a place you love being at. It could be somewhere from your childhood, somewhere you vacationed or just a place in your imagination. Feel how it is to be in the place you're imagining. Pay attention to the sounds, air, colors, and who is there with you. This is a place where you feel cared for and loved. All is well. You are safe. Take in all the smells, sounds, tastes and feelings. As you do, feel your body being nourished and healed. Feel the calm that surrounds you. Breathe in all the good. Breathe out any discomfort or tension. 7. Sleep Enough If you get less than seven to eight hours of sleep, your body won't tolerate stress as well as it could. If stress keeps you up at night, address the cause, and add extra meditation into your day to make up for the lost z's. Try to get seven to nine hours of sleep each night. Make a regular bedtime schedule. Keep your room dark and cool. Try to avoid computers, TV, cell phones and tablets before bed. 8. Bond with Connections You Enjoy Go out for a coffee with a friend, chat with a neighbor, call a family member, visit with a clergy member, or even hang out with your pet. Clinical studies show that spending even a short time with a companion animal can cut anxiety levels almost in half. 9. Take a Vacation Getting away from it all can reset your stress tolerance by increasing your mental and emotional outlook,  which makes you a happier, more productive person upon return. Leave your cellphone and laptop at home! 10. See a Counselor, Coach or Therapist If negative thoughts overwhelm your ability to make positive changes, it's time to seek professional help.  Make an appointment today--your health and life are worth it."  If you are experiencing a Mental Health or [place] or need someone to talk to, please call the Suicide and Crisis Lifeline: 988    Patient Goals: Follow up goal     Follow up:  Patient agrees to Care Plan and Follow-up.  Plan: The Managed Medicaid care management team will reach out to the patient again over the next 30 days.  Date/time of next scheduled Social Work care management/care coordination outreach:  08/31/22 at 48 am  Eula Fried, BSW, MSW, [place] Medicaid LCSW [email or web address] Phone: [phone]

## 2022-08-25 ENCOUNTER — Ambulatory Visit (INDEPENDENT_AMBULATORY_CARE_PROVIDER_SITE_OTHER): Payer: Medicaid Other | Admitting: Licensed Clinical Social Worker

## 2022-08-25 DIAGNOSIS — F32A Depression, unspecified: Secondary | ICD-10-CM | POA: Diagnosis not present
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
## 2022-08-31 ENCOUNTER — Other Ambulatory Visit: Payer: Medicaid Other | Admitting: Licensed Clinical Social Worker

## 2022-08-31 NOTE — Patient Instructions (Signed)
Visit Information  Hannah Fleming was given information about Medicaid Managed Care team care coordination services as a part of their [place] Medicaid benefit. Hannah Fleming verbally consented to engagement with the [facility] Managed Care team.   If you are experiencing a medical emergency, please call 911 or report to your local emergency department or urgent care.   If you have a non-emergency medical problem during routine business hours, please contact your provider's office and ask to speak with a nurse.   For questions related to your [facility], please call: [phone] or visit the homepage here: [email or web address]  If you would like to schedule transportation through your [facility], please call the following number at least 2 days in advance of your appointment: [phone]   Rides for urgent appointments can also be made after hours by calling Member Services.  Call the [facility] at [phone], at any time, 24 hours a day, 7 days a week. If you are in danger or need immediate medical attention call 911.  If you would like help to quit smoking, call 1-800-QUIT-NOW 9307725462) OR Espaol: 1-855-Djelo-Ya (6-568-127-5170) o para ms informacin haga clic aqu or Text READY to 200-400 to register via text   Following is a copy of your plan of care:  Care Plan : LCSW Plan of Care  Updates made by Hannah Cutter, LCSW since 08/31/2022 12:00 AM     Problem: Depression Identification (Depression)      Long-Range Goal: Depressive Symptoms Identified   Start Date: 08/07/2022  Note:   Priority: High  Timeframe:  Long-Range Goal Priority:  High Start Date:   08/07/22             Expected End Date:  ongoing                     Follow Up Date--09/14/22 at 75 am  - keep 90 percent of scheduled appointments -consider  counseling or psychiatry -consider bumping up your self-care  -consider creating a stronger support network   Why is this important?             Combatting depression may take some time.            If you don't feel better right away, don't give up on your treatment plan.    Current barriers:   Chronic Mental Health needs related to depression, stress and anxiety. Patient requires Support, Education, Resources, Referrals, Advocacy, and Care Coordination, in order to meet Unmet Mental Health Needs. Patient will implement clinical interventions discussed today to decrease symptoms of depression and increase knowledge and/or ability of: coping skills. Mental Health Concerns and Social Isolation Patient lacks knowledge of available community counseling agencies and resources.  Clinical Goal(s): verbalize understanding of plan for management of Anxiety, Depression, and Stress and demonstrate a reduction in symptoms. Patient will connect with a provider for ongoing mental health treatment, increase coping skills, healthy habits, self-management skills, and stress reduction        Patient Goals/Self-Care Activities: Over the next 120 days Attend scheduled medical appointments Utilize healthy coping skills and supportive resources discussed Contact PCP with any questions or concerns Keep 90 percent of counseling appointments Call your insurance provider for more information about your Enhanced Benefits  Check out counseling resources provided  Begin personal counseling with LCSW, to reduce and manage symptoms of Depression and Stress, until well-established with mental health provider Accept  all calls from representative with [facility] in an effort to establish ongoing mental health counseling and supportive services. Incorporate into daily practice - relaxation techniques, deep breathing exercises, and mindfulness meditation strategies. Talk about feelings with friends, family members, spiritual  advisor, etc. Contact LCSW directly [phone]), if you have questions, need assistance, or if additional social work needs are identified between now and our next scheduled telephone outreach call. Call 988 for mental health hotline/crisis line if needed (24/7 available) Try techniques to reduce symptoms of anxiety/negative thinking (deep breathing, distraction, positive self talk, etc)  - develop a personal safety plan - develop a plan to deal with triggers like holidays, anniversaries - exercise at least 2 to 3 times per week - have a plan for how to handle bad days - journal feelings and what helps to feel better or worse - spend time or talk with others at least 2 to 3 times per week - watch for early signs of feeling worse - begin personal counseling - call and visit an old friend - check out volunteer opportunities - join a support group - laugh; watch a funny movie or comedian - learn and use visualization or guided imagery - perform a random act of kindness - practice relaxation or meditation daily - start or continue a personal journal - practice positive thinking and self-talk -continue with compliance of taking medication  -identify current effective and ineffective coping strategies.  -implement positive self-talk in care to increase self-esteem, confidence and feelings of control.  -consider alternative and complementary therapy approaches such as meditation, mindfulness or yoga.  -journaling, prayer, worship services, meditation or pastoral counseling.  -increase participation in pleasurable group activities such as hobbies, singing, sports or volunteering).  -consider the use of meditative movement therapy such as tai chi, yoga or qigong.  -start a regular daily exercise program based on tolerance, ability and patient choice to support positive thinking and activity    10 LITTLE Things To Do When You're Feeling Too Down To Do Anything  Take a shower. Even if you  plan to stay in all day long and not see a soul, take a shower. It takes the most effort to hop in to the shower but once you do, you'll feel immediate results. It will wake you up and you'll be feeling much fresher (and cleaner too).  Brush and floss your teeth. Give your teeth a good brushing with a floss finish. It's a small task but it feels so good and you can check 'taking care of your health' off the list of things to do.  Do something small on your list. Most of [place] have some small thing we would like to get done (load of laundry, sew a button, email a friend). Doing one of these things will make you feel like you've accomplished something.  Drink water. Drinking water is easy right? It's also really beneficial for your health so keep a glass beside you all day and take sips often. It gives you energy and prevents you from boredom eating.  Do some floor exercises. The last thing you want to do is exercise but it might be just the thing you need the most. Keep it simple and do exercises that involve sitting or laying on the floor. Even the smallest of exercises release chemicals in the brain that make you feel good. Yoga stretches or core exercises are going to make you feel good with minimal effort.  Make your bed. Making your bed takes a  few minutes but it's productive and you'll feel relieved when it's done. An unmade bed is a huge visual reminder that you're having an unproductive day. Do it and consider it your housework for the day.  Put on some nice clothes. Take the sweatpants off even if you don't plan to go anywhere. Put on clothes that make you feel good. Take a look in the mirror so your brain recognizes the sweatpants have been replaced with clothes that make you look great. It's an instant confidence booster.  Wash the dishes. A pile of dirty dishes in the sink is a reflection of your mood. It's possible that if you wash up the dishes, your mood will follow suit. It's worth a  try.  Cook a real meal. If you have the luxury to have a "do nothing" day, you have time to make a real meal for yourself. Make a meal that you love to eat. The process is good to get you out of the funk and the food will ensure you have more energy for tomorrow.  Write out your thoughts by hand. When you hand write, you stimulate your brain to focus on the moment that you're in so make yourself comfortable and write whatever comes into your mind. Put those thoughts out on paper so they stop spinning around in your head. Those thoughts might be the very thing holding you down.  The following coping skill education was provided for stress relief and mental health management: "When your car dies or a deadline looms, how do you respond? Long-term, low-grade or acute stress takes a serious toll on your body and mind, so don't ignore feelings of constant tension. Stress is a natural part of life. However, too much stress can harm our health, especially if it continues every day. This is chronic stress and can put you at risk for heart problems like heart disease and depression. Understand what's happening inside your body and learn simple coping skills to combat the negative impacts of everyday stressors.  Types of Stress There are two types of stress: Emotional - types of emotional stress are relationship problems, pressure at work, financial worries, experiencing discrimination or having a major life change. Physical - Examples of physical stress include being sick having pain, not sleeping well, recovery from an injury or having an alcohol and drug use disorder. Fight or Flight Sudden or ongoing stress activates your nervous system and floods your bloodstream with adrenaline and cortisol, two hormones that raise blood pressure, increase heart rate and spike blood sugar. These changes pitch your body into a fight or flight response. That enabled our ancestors to outrun saber-toothed tigers, and it's  helpful today for situations like dodging a car accident. But most modern chronic stressors, such as finances or a challenging relationship, keep your body in that heightened state, which hurts your health. Effects of Too Much Stress If constantly under stress, most of [place] will eventually start to function less well.  Multiple studies link chronic stress to a higher risk of heart disease, stroke, depression, weight gain, memory loss and even premature death, so it's important to recognize the warning signals. Talk to your doctor about ways to manage stress if you're experiencing any of these symptoms: Prolonged periods of poor sleep. Regular, severe headaches. Unexplained weight loss or gain. Feelings of isolation, withdrawal or worthlessness. Constant anger and irritability. Loss of interest in activities. Constant worrying or obsessive thinking. Excessive alcohol or drug use. Inability to concentrate.  10 Ways  to Cope with Chronic Stress It's key to recognize stressful situations as they occur because it allows you to focus on managing how you react. We all need to know when to close our eyes and take a deep breath when we feel tension rising. Use these tips to prevent or reduce chronic stress. 1. Rebalance Work and Home All work and no play? If you're spending too much time at the office, intentionally put more dates in your calendar to enjoy time for fun, either alone or with others. 2. Get Regular Exercise Moving your body on a regular basis balances the nervous system and increases blood circulation, helping to flush out stress hormones. Even a daily 20-minute walk makes a difference. Any kind of exercise can lower stress and improve your mood ? just pick activities that you enjoy and make it a regular habit. 3. Eat Well and Limit Alcohol and Stimulants Alcohol, nicotine and caffeine may temporarily relieve stress but have negative health impacts and can make stress worse in the long run.  Well-nourished bodies cope better, so start with a good breakfast, add more organic fruits and vegetables for a well-balanced diet, avoid processed foods and sugar, try herbal tea and drink more water. 4. Connect with Supportive People Talking face to face with another person releases hormones that reduce stress. Lean on those good listeners in your life. 5. Catherine Time Do you enjoy gardening, reading, listening to music or some other creative pursuit? Engage in activities that bring you pleasure and joy; research shows that reduces stress by almost half and lowers your heart rate, too. 6. Practice Meditation, Stress Reduction or Yoga Relaxation techniques activate a state of restfulness that counterbalances your body's fight-or-flight hormones. Even if this also means a 10-minute break in a long day: listen to music, read, go for a walk in nature, do a hobby, take a bath or spend time with a friend. Also consider doing a mindfulness exercise or try a daily deep breathing or imagery practice. Deep Breathing Slow, calm and deep breathing can help you relax. Try these steps to focus on your breathing and repeat as needed. Find a comfortable position and close your eyes. Exhale and drop your shoulders. Breathe in through your nose; fill your lungs and then your belly. Think of relaxing your body, quieting your mind and becoming calm and peaceful. Breathe out slowly through your nose, relaxing your belly. Think of releasing tension, pain, worries or distress. Repeat steps three and four until you feel relaxed. Imagery This involves using your mind to excite the senses -- sound, vision, smell, taste and feeling. This may help ease your stress. Begin by getting comfortable and then do some slow breathing. Imagine a place you love being at. It could be somewhere from your childhood, somewhere you vacationed or just a place in your imagination. Feel how it is to be in the place you're imagining.  Pay attention to the sounds, air, colors, and who is there with you. This is a place where you feel cared for and loved. All is well. You are safe. Take in all the smells, sounds, tastes and feelings. As you do, feel your body being nourished and healed. Feel the calm that surrounds you. Breathe in all the good. Breathe out any discomfort or tension. 7. Sleep Enough If you get less than seven to eight hours of sleep, your body won't tolerate stress as well as it could. If stress keeps you up at night, address the  cause, and add extra meditation into your day to make up for the lost z's. Try to get seven to nine hours of sleep each night. Make a regular bedtime schedule. Keep your room dark and cool. Try to avoid computers, TV, cell phones and tablets before bed. 8. Bond with Connections You Enjoy Go out for a coffee with a friend, chat with a neighbor, call a family member, visit with a clergy member, or even hang out with your pet. Clinical studies show that spending even a short time with a companion animal can cut anxiety levels almost in half. 9. Take a Vacation Getting away from it all can reset your stress tolerance by increasing your mental and emotional outlook, which makes you a happier, more productive person upon return. Leave your cellphone and laptop at home! 10. See a Counselor, Coach or Therapist If negative thoughts overwhelm your ability to make positive changes, it's time to seek professional help. Make an appointment today--your health and life are worth it."  If you are experiencing a Mental Health or [place] or need someone to talk to, please call the Suicide and Crisis Lifeline: 988    Patient Goals: Follow up goal

## 2022-08-31 NOTE — Patient Outreach (Signed)
Medicaid Managed Care Social Work Note  08/31/2022 Name:  Hannah Fleming MRN:  [id number] DOB:  Feb 12, 1981  Hannah Fleming is an 42 y.o. year old female who is a primary patient of [name], MD.  The Medicaid Managed Care Coordination team was consulted for assistance with:  [place] and Resources  Ms. Duan was given information about Medicaid Managed Care Coordination team services today. Hannah Fleming Patient agreed to services and verbal consent obtained.  Engaged with patient  for by telephone forfollow up visit in response to referral for case management and/or care coordination services.   Assessments/Interventions:  Review of past medical history, allergies, medications, health status, including review of consultants reports, laboratory and other test data, was performed as part of comprehensive evaluation and provision of chronic care management services.  SDOH: (Social Determinant of Health) assessments and interventions performed: SDOH Interventions    Flowsheet Row Patient Outreach Telephone from 08/31/2022 in [place] Patient Outreach Telephone from 08/14/2022 in [place] Patient Outreach Telephone from 08/07/2022 in [place] Patient Outreach Telephone from 08/04/2022 in [place] Patient Outreach Telephone from 04/15/2021 in [place] Coordination  SDOH Interventions       Food Insecurity Interventions -- -- -- Intervention Not Indicated --  Housing Interventions -- -- -- -- --  [referred patient to [facility] BSW]  Transportation Interventions -- -- -- -- Intervention Not Indicated  Depression Interventions/Treatment  -- -- Counseling -- --  Financial Strain Interventions -- -- -- -- --  [referred to [facility] BSW]  Physical Activity Interventions -- -- -- -- Intervention Not Indicated  Stress  Interventions Provide Counseling, Offered [employer] Resources Offered [employer], Provide Counseling Offered [employer], Provide Counseling -- --       Advanced Directives Status:  See Care Plan for related entries.  Care Plan                 Allergies  Allergen Reactions   Prilosec Otc [Omeprazole Magnesium] Hives   Latex Itching and Rash    Medications Reviewed Today     Reviewed by Greg Cutter, LCSW (Social Worker) on 08/14/22 at 1117  Med List Status: <None>   Medication Order Taking? Sig Documenting Provider Last Dose Status Informant  acetaminophen (TYLENOL) 500 MG tablet [id number] No Take 1,000 mg by mouth every 6 (six) hours as needed for mild pain or moderate pain.  Patient not taking: Reported on 08/03/2022   [provider] Not Taking Active Self  ibuprofen (ADVIL) 600 MG tablet [id number] No Take 1 tablet (600 mg total) by mouth every 6 (six) hours as needed.  Patient not taking: Reported on 08/03/2022   Constant, Peggy, MD Not Taking Active   metroNIDAZOLE (FLAGYL) 500 MG tablet [id number] No Take 1 tablet (500 mg total) by mouth 2 (two) times daily.  Patient not taking: Reported on 08/04/2022   Constant, Peggy, MD Not Taking Active   metroNIDAZOLE 1.3 % GEL [id number]  Place 1 Applicatorful vaginally 2 (two) times daily. [name], MD  Active   mirtazapine (REMERON) 15 MG tablet [id number]  Take 1 tablet (15 mg total) by mouth at bedtime. [name], MD  Active   Norethindrone Acetate-Ethinyl Estrad-FE (LOESTRIN 24 FE) 1-20 MG-MCG(24) tablet [id number] No Take 1 tablet by mouth daily. Constant, Peggy, MD Taking Active   oxyCODONE-acetaminophen (PERCOCET/ROXICET) 5-325 MG tablet [id number] No  Take 1 tablet by mouth every 6 (six) hours as needed.  Patient not taking: Reported on 08/03/2022   Constant, Peggy, MD Not Taking Active   Prenat-Fe Carbonyl-FA-Omega 3 (ONE-A-DAY WOMENS PRENATAL 1) 28-0.8-235 MG CAPS  [id number] No Take 1 capsule by mouth daily.  Patient not taking: Reported on 08/03/2022   [provider] Not Taking Active Self  promethazine (PHENERGAN) 25 MG tablet [id number] No Take 1 tablet (25 mg total) by mouth every 6 (six) hours as needed for nausea or vomiting.  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   Bacterial vaginitis 11/27/2020   Candida vaginitis 11/27/2020   Prediabetes 06/21/2019   Ear itch 03/30/2019   Tonsillar calculus 03/30/2019   Menorrhagia with regular cycle 12/28/2013   Anxiety 02/07/2013   Dysmenorrhea 02/07/2013    Conditions to be addressed/monitored per PCP order:  Anxiety and Depression  Care Plan : LCSW Plan of Care  Updates made by Greg Cutter, LCSW since 08/31/2022 12:00 AM     Problem: Depression Identification (Depression)      Long-Range Goal: Depressive Symptoms Identified   Start Date: 08/07/2022  Note:   Priority: High  Timeframe:  Long-Range Goal Priority:  High Start Date:   08/07/22             Expected End Date:  ongoing                     Follow Up Date--09/14/22 at 915 am  - keep 90 percent of scheduled appointments -consider counseling or psychiatry -consider bumping up your self-care  -consider creating a stronger support network   Why is this important?             Combatting depression may take some time.            If you don't feel better right away, don't give up on your treatment plan.    Current barriers:   Chronic Mental Health needs related to depression, stress and anxiety. Patient requires Support, Education, Resources, Referrals, Advocacy, and Care Coordination, in order to meet Unmet Mental Health Needs. Patient will  implement clinical interventions discussed today to decrease symptoms of depression and increase knowledge and/or ability of: coping skills. Mental Health Concerns and Social Isolation Patient lacks knowledge of available community counseling agencies and resources.  Clinical Goal(s): verbalize understanding of plan for management of Anxiety, Depression, and Stress and demonstrate a reduction in symptoms. Patient will connect with a provider for ongoing mental health treatment, increase coping skills, healthy habits, self-management skills, and stress reduction        Clinical Interventions:  Assessed patient's previous and current treatment, coping skills, support system and barriers to care. Patient provided hx Patient reports that she has had a lot of issues socially and personally and that has affected her mood. Patient is only interested in counseling at this time as PCP has prescribed her a new medication to help her mood. Verbalization of feelings encouraged, motivational interviewing employed Emotional support provided, positive coping strategies explored. Establishing healthy boundaries emphasized and healthy self-care education provided Patient was educated on available mental health resources within their area that accept Medicaid and  offer counseling and psychiatry. Email sent to patient today with resource request- [place]  [place], [place] [address] [place] [employer],  [place], [place];  [employer] ; [place], [place] , [place] as well as instructions for scheduling at [facility] as well as some crisis support resources and [facility]'s walk in clinic hours Patient is agreeable to referral to [facility] for counseling ONLY. [facility] LCSW made referral on 08/07/22. LCSW provided education on relaxation techniques such as meditation, deep breathing, massage, grounding exercises or yoga that can activate the body's relaxation response and  ease symptoms of stress and anxiety. LCSW ask that when pt is struggling with difficult emotions and racing thoughts that they start this relaxation response process. LCSW provided extensive education on healthy coping skills for anxiety. SW used active and reflective listening, validated patient's feelings/concerns, and provided emotional support. Patient will work on implementing appropriate self-care habits into their daily routine such as: staying positive, writing a gratitude list, drinking water, staying active around the house, taking their medications as prescribed, combating negative thoughts or emotions and staying connected with their family and friends. Positive reinforcement provided for this decision to work on this. Motivational Interviewing employed Depression screen reviewed  PHQ2/ PHQ9 completed or reviewed  Mindfulness or Relaxation training provided Active listening / Reflection utilized  Advance Care and HCPOA education provided Emotional Support Provided Problem [place] strategies reviewed Provided psychoeducation for mental health needs  Provided brief CBT  Reviewed mental health medications and discussed importance of compliance:  Quality of sleep assessed & Sleep Hygiene techniques promoted  Participation in counseling encouraged  Verbalization of feelings encouraged  Suicidal Ideation/Homicidal Ideation assessed: Patient denies SI/HI  Review resources, discussed options and provided patient information about  [place] care team collaboration (see longitudinal plan of care) 08/15/23 update-  patient reports that her mood has been relatively more stable lately. She was unable to confirm if she received email that [facility] LCSW sent so [facility] LCSW sent additional email today and had patient write down contact information for [facility] in order for her to contact them to schedule her initial counseling session. Brief self-care education  provided as well. [facility] LCSW will follow up after the new year. Update- Patient reports that she was able to start counseling with a LCSW at her PCP's office so she no longer is in need of the counseling referral [facility] LCSW made for her. Patient is wanting more time to consider a psychiatry referral. She reports that she is having issues with her current medication that her PCP prescribed because it makes her very sleepy. [facility] LCSW will follow up in a few weeks to see if she has come to a decision about implementing psychiatry services. Email sent to patient with resources as well. Patient Goals/Self-Care Activities: Over the next 120 days Attend scheduled medical appointments Utilize healthy coping skills and supportive resources discussed Contact PCP with any questions or concerns Keep 90 percent of counseling appointments Call your insurance provider for more information about your Enhanced Benefits  Check out counseling resources provided  Begin personal counseling with LCSW, to reduce and manage symptoms of Depression and Stress, until well-established with mental health provider Accept all calls from representative with [facility] in an effort to establish ongoing mental health counseling and supportive services. Incorporate into daily practice - relaxation techniques, deep breathing exercises, and mindfulness meditation strategies. Talk about feelings with friends, family members, spiritual advisor, etc. Contact LCSW  directly [phone]), if you have questions, need assistance, or if additional social work needs are identified between now and our next scheduled telephone outreach call. Call 988 for mental health hotline/crisis line if needed (24/7 available) Try techniques to reduce symptoms of anxiety/negative thinking (deep breathing, distraction, positive self talk, etc)  - develop a personal safety plan - develop a plan to deal with triggers like holidays, anniversaries - exercise at least 2 to  3 times per week - have a plan for how to handle bad days - journal feelings and what helps to feel better or worse - spend time or talk with others at least 2 to 3 times per week - watch for early signs of feeling worse - begin personal counseling - call and visit an old friend - check out volunteer opportunities - join a support group - laugh; watch a funny movie or comedian - learn and use visualization or guided imagery - perform a random act of kindness - practice relaxation or meditation daily - start or continue a personal journal - practice positive thinking and self-talk -continue with compliance of taking medication  -identify current effective and ineffective coping strategies.  -implement positive self-talk in care to increase self-esteem, confidence and feelings of control.  -consider alternative and complementary therapy approaches such as meditation, mindfulness or yoga.  -journaling, prayer, worship services, meditation or pastoral counseling.  -increase participation in pleasurable group activities such as hobbies, singing, sports or volunteering).  -consider the use of meditative movement therapy such as tai chi, yoga or qigong.  -start a regular daily exercise program based on tolerance, ability and patient choice to support positive thinking and activity    10 LITTLE Things To Do When You're Feeling Too Down To Do Anything  Take a shower. Even if you plan to stay in all day long and not see a soul, take a shower. It takes the most effort to hop in to the shower but once you do, you'll feel immediate results. It will wake you up and you'll be feeling much fresher (and cleaner too).  Brush and floss your teeth. Give your teeth a good brushing with a floss finish. It's a small task but it feels so good and you can check 'taking care of your health' off the list of things to do.  Do something small on your list. Most of [place] have some small thing we would like to get  done (load of laundry, sew a button, email a friend). Doing one of these things will make you feel like you've accomplished something.  Drink water. Drinking water is easy right? It's also really beneficial for your health so keep a glass beside you all day and take sips often. It gives you energy and prevents you from boredom eating.  Do some floor exercises. The last thing you want to do is exercise but it might be just the thing you need the most. Keep it simple and do exercises that involve sitting or laying on the floor. Even the smallest of exercises release chemicals in the brain that make you feel good. Yoga stretches or core exercises are going to make you feel good with minimal effort.  Make your bed. Making your bed takes a few minutes but it's productive and you'll feel relieved when it's done. An unmade bed is a huge visual reminder that you're having an unproductive day. Do it and consider it your housework for the day.  Put on some nice clothes. Take the  sweatpants off even if you don't plan to go anywhere. Put on clothes that make you feel good. Take a look in the mirror so your brain recognizes the sweatpants have been replaced with clothes that make you look great. It's an instant confidence booster.  Wash the dishes. A pile of dirty dishes in the sink is a reflection of your mood. It's possible that if you wash up the dishes, your mood will follow suit. It's worth a try.  Cook a real meal. If you have the luxury to have a "do nothing" day, you have time to make a real meal for yourself. Make a meal that you love to eat. The process is good to get you out of the funk and the food will ensure you have more energy for tomorrow.  Write out your thoughts by hand. When you hand write, you stimulate your brain to focus on the moment that you're in so make yourself comfortable and write whatever comes into your mind. Put those thoughts out on paper so they stop spinning around in your  head. Those thoughts might be the very thing holding you down.  The following coping skill education was provided for stress relief and mental health management: "When your car dies or a deadline looms, how do you respond? Long-term, low-grade or acute stress takes a serious toll on your body and mind, so don't ignore feelings of constant tension. Stress is a natural part of life. However, too much stress can harm our health, especially if it continues every day. This is chronic stress and can put you at risk for heart problems like heart disease and depression. Understand what's happening inside your body and learn simple coping skills to combat the negative impacts of everyday stressors.  Types of Stress There are two types of stress: Emotional - types of emotional stress are relationship problems, pressure at work, financial worries, experiencing discrimination or having a major life change. Physical - Examples of physical stress include being sick having pain, not sleeping well, recovery from an injury or having an alcohol and drug use disorder. Fight or Flight Sudden or ongoing stress activates your nervous system and floods your bloodstream with adrenaline and cortisol, two hormones that raise blood pressure, increase heart rate and spike blood sugar. These changes pitch your body into a fight or flight response. That enabled our ancestors to outrun saber-toothed tigers, and it's helpful today for situations like dodging a car accident. But most modern chronic stressors, such as finances or a challenging relationship, keep your body in that heightened state, which hurts your health. Effects of Too Much Stress If constantly under stress, most of [place] will eventually start to function less well.  Multiple studies link chronic stress to a higher risk of heart disease, stroke, depression, weight gain, memory loss and even premature death, so it's important to recognize the warning signals. Talk to your  doctor about ways to manage stress if you're experiencing any of these symptoms: Prolonged periods of poor sleep. Regular, severe headaches. Unexplained weight loss or gain. Feelings of isolation, withdrawal or worthlessness. Constant anger and irritability. Loss of interest in activities. Constant worrying or obsessive thinking. Excessive alcohol or drug use. Inability to concentrate.  10 Ways to Cope with Chronic Stress It's key to recognize stressful situations as they occur because it allows you to focus on managing how you react. We all need to know when to close our eyes and take a deep breath when we feel tension  rising. Use these tips to prevent or reduce chronic stress. 1. Rebalance Work and Home All work and no play? If you're spending too much time at the office, intentionally put more dates in your calendar to enjoy time for fun, either alone or with others. 2. Get Regular Exercise Moving your body on a regular basis balances the nervous system and increases blood circulation, helping to flush out stress hormones. Even a daily 20-minute walk makes a difference. Any kind of exercise can lower stress and improve your mood ? just pick activities that you enjoy and make it a regular habit. 3. Eat Well and Limit Alcohol and Stimulants Alcohol, nicotine and caffeine may temporarily relieve stress but have negative health impacts and can make stress worse in the long run. Well-nourished bodies cope better, so start with a good breakfast, add more organic fruits and vegetables for a well-balanced diet, avoid processed foods and sugar, try herbal tea and drink more water. 4. Connect with Supportive People Talking face to face with another person releases hormones that reduce stress. Lean on those good listeners in your life. 5. [place] Time Do you enjoy gardening, reading, listening to music or some other creative pursuit? Engage in activities that bring you pleasure and joy;  research shows that reduces stress by almost half and lowers your heart rate, too. 6. Practice Meditation, Stress Reduction or Yoga Relaxation techniques activate a state of restfulness that counterbalances your body's fight-or-flight hormones. Even if this also means a 10-minute break in a long day: listen to music, read, go for a walk in nature, do a hobby, take a bath or spend time with a friend. Also consider doing a mindfulness exercise or try a daily deep breathing or imagery practice. Deep Breathing Slow, calm and deep breathing can help you relax. Try these steps to focus on your breathing and repeat as needed. Find a comfortable position and close your eyes. Exhale and drop your shoulders. Breathe in through your nose; fill your lungs and then your belly. Think of relaxing your body, quieting your mind and becoming calm and peaceful. Breathe out slowly through your nose, relaxing your belly. Think of releasing tension, pain, worries or distress. Repeat steps three and four until you feel relaxed. Imagery This involves using your mind to excite the senses -- sound, vision, smell, taste and feeling. This may help ease your stress. Begin by getting comfortable and then do some slow breathing. Imagine a place you love being at. It could be somewhere from your childhood, somewhere you vacationed or just a place in your imagination. Feel how it is to be in the place you're imagining. Pay attention to the sounds, air, colors, and who is there with you. This is a place where you feel cared for and loved. All is well. You are safe. Take in all the smells, sounds, tastes and feelings. As you do, feel your body being nourished and healed. Feel the calm that surrounds you. Breathe in all the good. Breathe out any discomfort or tension. 7. Sleep Enough If you get less than seven to eight hours of sleep, your body won't tolerate stress as well as it could. If stress keeps you up at night, address the  cause, and add extra meditation into your day to make up for the lost z's. Try to get seven to nine hours of sleep each night. Make a regular bedtime schedule. Keep your room dark and cool. Try to avoid computers, TV, cell phones  and tablets before bed. 8. Bond with Connections You Enjoy Go out for a coffee with a friend, chat with a neighbor, call a family member, visit with a clergy member, or even hang out with your pet. Clinical studies show that spending even a short time with a companion animal can cut anxiety levels almost in half. 9. Take a Vacation Getting away from it all can reset your stress tolerance by increasing your mental and emotional outlook, which makes you a happier, more productive person upon return. Leave your cellphone and laptop at home! 10. See a Counselor, Coach or Therapist If negative thoughts overwhelm your ability to make positive changes, it's time to seek professional help. Make an appointment today--your health and life are worth it."  If you are experiencing a Mental Health or [place] or need someone to talk to, please call the Suicide and Crisis Lifeline: 988    Patient Goals: Follow up goal     Follow up:  Patient agrees to Care Plan and Follow-up.  Plan: The Managed Medicaid care management team will reach out to the patient again over the next 30 days.  Date/time of next scheduled Social Work care management/care coordination outreach:  09/14/22 at [place] am  Eula Fried, BSW, MSW, [place] Medicaid LCSW [email or web address] Phone: [phone]

## 2022-09-02 ENCOUNTER — Ambulatory Visit: Payer: Medicaid Other | Admitting: Family Medicine

## 2022-09-08 ENCOUNTER — Encounter: Payer: Self-pay | Admitting: Obstetrics and Gynecology

## 2022-09-08 ENCOUNTER — Encounter: Payer: Self-pay | Admitting: Physician Assistant

## 2022-09-08 ENCOUNTER — Encounter: Payer: Self-pay | Admitting: *Deleted

## 2022-09-08 ENCOUNTER — Ambulatory Visit: Payer: Medicaid Other | Attending: Physician Assistant | Admitting: Physician Assistant

## 2022-09-08 ENCOUNTER — Other Ambulatory Visit: Payer: Medicaid Other | Admitting: Obstetrics and Gynecology

## 2022-09-08 VITALS — BP 134/83 | HR 85 | Resp 15 | Ht 64.0 in | Wt 211.0 lb

## 2022-09-08 DIAGNOSIS — M2550 Pain in unspecified joint: Secondary | ICD-10-CM

## 2022-09-08 DIAGNOSIS — R7303 Prediabetes: Secondary | ICD-10-CM | POA: Diagnosis not present

## 2022-09-08 DIAGNOSIS — M19072 Primary osteoarthritis, left ankle and foot: Secondary | ICD-10-CM

## 2022-09-08 DIAGNOSIS — M62838 Other muscle spasm: Secondary | ICD-10-CM | POA: Diagnosis not present

## 2022-09-08 DIAGNOSIS — M7918 Myalgia, other site: Secondary | ICD-10-CM

## 2022-09-08 DIAGNOSIS — R768 Other specified abnormal immunological findings in serum: Secondary | ICD-10-CM | POA: Diagnosis not present

## 2022-09-08 DIAGNOSIS — M7061 Trochanteric bursitis, right hip: Secondary | ICD-10-CM

## 2022-09-08 DIAGNOSIS — M47816 Spondylosis without myelopathy or radiculopathy, lumbar region: Secondary | ICD-10-CM | POA: Diagnosis not present

## 2022-09-08 DIAGNOSIS — M19071 Primary osteoarthritis, right ankle and foot: Secondary | ICD-10-CM

## 2022-09-08 DIAGNOSIS — M4126 Other idiopathic scoliosis, lumbar region: Secondary | ICD-10-CM | POA: Diagnosis not present

## 2022-09-08 DIAGNOSIS — M7062 Trochanteric bursitis, left hip: Secondary | ICD-10-CM

## 2022-09-08 DIAGNOSIS — N946 Dysmenorrhea, unspecified: Secondary | ICD-10-CM

## 2022-09-08 DIAGNOSIS — F419 Anxiety disorder, unspecified: Secondary | ICD-10-CM | POA: Diagnosis not present

## 2022-09-08 DIAGNOSIS — M170 Bilateral primary osteoarthritis of knee: Secondary | ICD-10-CM | POA: Diagnosis not present

## 2022-09-08 DIAGNOSIS — M19041 Primary osteoarthritis, right hand: Secondary | ICD-10-CM

## 2022-09-08 DIAGNOSIS — M19042 Primary osteoarthritis, left hand: Secondary | ICD-10-CM

## 2022-09-08 MED ORDER — METHOCARBAMOL 500 MG PO TABS: 500.0000 mg | ORAL_TABLET | Freq: Every day | ORAL | 0 refills | Status: DC | PRN

## 2022-09-08 NOTE — Patient Outreach (Signed)
Medicaid Managed Care   Nurse Care Manager Note  09/08/2022 Name:  Hannah Fleming MRN:  [id number] DOB:  Oct 27, 1980  Hannah Fleming is an 42 y.o. year old female who is a primary patient of [name], MD.  The [facility] Managed Care Coordination team was consulted for assistance with:    Chronic healthcare management needs and SDOH, weight management, depression/anxiety  Ms. Rhines was given information about Medicaid Managed Care Coordination team services today. Hannah Fleming Patient agreed to services and verbal consent obtained.  Engaged with patient by telephone for follow up visit in response to provider referral for case management and/or care coordination services.   Assessments/Interventions:  Review of past medical history, allergies, medications, health status, including review of consultants reports, laboratory and other test data, was performed as part of comprehensive evaluation and provision of chronic care management services.  SDOH (Social Determinants of Health) assessments and interventions performed: SDOH Interventions    Flowsheet Row Patient Outreach Telephone from 09/08/2022 in [place] Patient Outreach Telephone from 08/31/2022 in [place] Patient Outreach Telephone from 08/14/2022 in [place] Patient Outreach Telephone from 08/07/2022 in [place] Patient Outreach Telephone from 08/04/2022 in [place] Patient Outreach Telephone from 04/15/2021 in [place] Coordination  SDOH Interventions        Food Insecurity Interventions -- -- -- -- Intervention Not Indicated --  Housing Interventions -- -- -- -- -- --  [referred patient to HR MM BSW]  Transportation Interventions -- -- -- -- -- Intervention Not Indicated  Utilities Interventions Intervention Not Indicated -- -- -- -- --   Alcohol Usage Interventions Intervention Not Indicated (Score <7) -- -- -- -- --  Depression Interventions/Treatment  -- -- -- Counseling -- --  Financial Strain Interventions -- -- -- -- -- --  [referred to HR [facility] BSW]  Physical Activity Interventions -- -- -- -- -- Intervention Not Indicated  Stress Interventions -- Provide Counseling, Offered [employer] Resources Offered [employer], Provide Counseling Offered [employer] Resources, Provide Counseling -- --     Care Plan  Allergies  Allergen Reactions   Prilosec Otc [Omeprazole Magnesium] Hives   Latex Itching and Rash    Medications Reviewed Today     Reviewed by [name], RN (Registered Nurse) on 09/08/22 at [place] List Status: <None>   Medication Order Taking? Sig Documenting Provider Last Dose Status Informant  acetaminophen (TYLENOL) 500 MG tablet [id number]  Take 1,000 mg by mouth every 6 (six) hours as needed for mild pain or moderate pain.  Patient not taking: Reported on 08/03/2022   [provider]  Active Self  ibuprofen (ADVIL) 600 MG tablet [id number]  Take 1 tablet (600 mg total) by mouth every 6 (six) hours as needed.  Patient not taking: Reported on 08/03/2022   Constant, Peggy, MD  Active   metroNIDAZOLE (FLAGYL) 500 MG tablet [id number]  Take 1 tablet (500 mg total) by mouth 2 (two) times daily.  Patient not taking: Reported on 08/04/2022   Constant, Peggy, MD  Active   metroNIDAZOLE 1.3 % GEL [id number] No Place 1 Applicatorful vaginally 2 (two) times daily.  Patient not taking: Reported on 09/08/2022   [name], MD Not Taking Active   mirtazapine (REMERON) 15 MG tablet [id number] Yes Take 1 tablet (15 mg total) by mouth at bedtime. [name], MD Taking Active  Norethindrone Acetate-Ethinyl Estrad-FE (LOESTRIN 24 FE) 1-20 MG-MCG(24) tablet [id number] Yes Take 1 tablet by mouth daily. Constant, Peggy, MD Taking Active   oxyCODONE-acetaminophen  (PERCOCET/ROXICET) 5-325 MG tablet [id number]  Take 1 tablet by mouth every 6 (six) hours as needed.  Patient not taking: Reported on 08/03/2022   Constant, Peggy, MD  Active   Prenat-Fe Carbonyl-FA-Omega 3 (ONE-A-DAY WOMENS PRENATAL 1) 28-0.8-235 MG CAPS [id number]  Take 1 capsule by mouth daily.  Patient not taking: Reported on 08/03/2022   [provider]  Active Self  promethazine (PHENERGAN) 25 MG tablet [id number]  Take 1 tablet (25 mg total) by mouth every 6 (six) hours as needed for nausea or vomiting.  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   Bacterial vaginitis 11/27/2020   Candida vaginitis 11/27/2020   Prediabetes 06/21/2019   Ear itch 03/30/2019   Tonsillar calculus 03/30/2019   Menorrhagia with regular cycle 12/28/2013   Anxiety 02/07/2013   Dysmenorrhea 02/07/2013   Conditions to be addressed/monitored per PCP order:   chronic healthcare management needs and SDOH, weight management, anxiety/depression  Care Plan : [place]  Updates made by [name], RN since 09/08/2022 12:00 AM     Problem: Chronic Disease Management and Care Coordination Needs      Long-Range Goal: Development of Plan Of Care For Chronic Disease Management and Care Coordination Needs To Assist With Meeting Treatment Goals   Start Date: 04/15/2021  Expected End Date: 11/03/2022  Priority: High  Note:   Current Barriers:  Care Coordination needs related eye and dental resources Chronic Disease Management support and education needs related to weight concerns, depression/anxiety RNCM Clinical Goal(s): Patient will verbalize basic understanding of weight management and depression disease process and self health  management plan .  take all medications exactly as prescribed and will call provider for medication related questions attend all scheduled medical appointments: with primary care and specialist demonstrate ongoing adherence to prescribed treatment plan for  weight management and depression as evidenced by exercise 5-7 days/week adherence to prescribed medication regimen contacting provider for new or worsened symptoms or questions . continue to work with RN Care Manager to address care management and care coordination needs related to weight management and depression work with Education officer, museum to address depression through collaboration with Consulting civil engineer, provider, and care tem.  Interventions Inter-disciplinary  care team collaboration (see longitudinal plan of care) Evaluation of current treatment plan related to  self management and patient's adherence to plan as established by provider Collaborated with LCSW LCSW referral for depression-completed Collaborated with BSW BSW referral for eye and dental providers at patient request.  Weight Loss:  (Status: Goal on track: NO.) Provided patient with contact information for managed Medicaid health plan customer service phone number about weight management benefits  05/13/21- Reviewed weight management benefits offered by patient's health plan and again encouraged her to call customer service for details 08/04/22:  Discussed nutrition referral and/or Healthy Weight and Wellness-patient to f/u with provider 09/08/22:  patient considering nutrition referral.  PreDiabetes:  (Status: Goal on track: NO.) Lab Results  Component Value Date   HGBA1C 5.7 (H) 12/06/2019  Assessed patient's understanding of  A1c goal:  <5.7% Provided education to patient about basic prediabetes disease process; Provided written information on prediabetes eating plan Counseled on importance of regular laboratory monitoring as prescribed;        Discussed plans with patient  for ongoing care management follow up and provided patient with direct contact information for care management team;      09/08/22:  patient considering nutrition referral  Pain:  (Status: New goal. Goal on track: YES.) Pain assessment performed Medications reviewed Reviewed provider established plan for pain management; Discussed importance of adherence to all scheduled medical appointments; Counseled on the importance of reporting any/all new or changed pain symptoms or management strategies to pain management provider; Reviewed with patient prescribed pharmacological and nonpharmacological pain relief strategies; Provided written health plan information related to alternative healing benefits such as  massage and acupuncture 05/13/21- Encouraged patient to review chronic pain education mailed to her last month 08/04/22:  No complaints of pain  Patient Goals/Self-Care Activities: Patient will self administer medications as prescribed  Patient will attend all scheduled provider appointments  Patient will review educational material mailed to her on 04/15/21 Patient will continue to perform ADL's independently Patient will continue to perform IADL's independently Patient will call provider office for new concerns or questions Patient will follow up with provider regarding medication and weight management    Follow Up:  Patient agrees to Care Plan and Follow-up.  Plan: The Managed Medicaid care management team will reach out to the patient again over the next 30 business  days. and The  Patient has been provided with contact information for the Managed Medicaid care management team and has been advised to call with any health related questions or concerns.  Date/time of next scheduled RN care management/care coordination outreach:  10/12/22 at 1230.

## 2022-09-08 NOTE — Patient Instructions (Signed)
Hi Ms. Dobratz, it was a pleasure speaking with you today-have a wonderful afternoon!!  Ms. Merritts was given information about Medicaid Managed Care team care coordination services as a part of their [place] Medicaid benefit. [name] verbally consented to engagement with the [facility] Managed Care team.   If you are experiencing a medical emergency, please call 911 or report to your local emergency department or urgent care.   If you have a non-emergency medical problem during routine business hours, please contact your provider's office and ask to speak with a nurse.   For questions related to your [facility], please call: [phone] or visit the homepage here: [email or web address]  If you would like to schedule transportation through your [facility], please call the following number at least 2 days in advance of your appointment: [phone]   Rides for urgent appointments can also be made after hours by calling Member Services.  Call the [place] at [phone], at any time, 24 hours a day, 7 days a week. If you are in danger or need immediate medical attention call 911.  If you would like help to quit smoking, call 1-800-QUIT-NOW (224)054-3226) OR Espaol: 1-855-Djelo-Ya (3-536-144-3154) o para ms informacin haga clic aqu or Text READY to 200-400 to register via text  Ms. Caudle - following are the goals we discussed in your visit today:   Goals Addressed             This Visit's Progress    Cope with Chronic Pain       Timeframe:  Long-Range Goal Priority:  Medium Start Date:     04/15/21                        Expected End Date:    ongoing                   Follow Up Date 10/12/22   - learn relaxation techniques - practice acceptance of chronic pain - practice relaxation or meditation daily - spend time with  positive people - think of new ways to do favorite things - use distraction techniques - use relaxation during pain  - review written information mailed to me on chronic pain   Why is this important?   Stress makes chronic pain feel worse.  Feelings like depression, anxiety, stress and anger can make your body more sensitive to pain.  Learning ways to cope with stress or depression may help you find some relief from the pain.     09/08/22:  patient does not complain of pain today.     Find Help in My Community       Timeframe:  Short-Term Goal Priority:  High Start Date:       04/15/21                      Expected End Date:    ongoing                   Follow Up Date 10/12/22   - follow-up on any referrals for help I am given    Why is this important?   Knowing how and where to find help for yourself or family in your neighborhood and community is an important skill.  You will want to take some steps to learn how.    09/08/22:  referral placed to [facility]  for dental and eye providers at her request.     Learn More About My Health       Timeframe:  Long-Range Goal Priority:  High Start Date:   04/15/21                          Expected End Date:     ongoing                  Follow Up Date 10/12/22   - make a list of questions - ask questions - review educational material mailed to me  on prediabetes and chronic pain - call my health plan customer service number to secure replacement card and to find out about benefits related to weight management, phone program and earning money via completing wellness activities  - review information mailed to me about health plan benefits   Why is this important?   The best way to learn about your health and care is by talking to the doctor and nurse.  They will answer your questions and give you information in the way that you like best.    09/08/22:  Patient received new insurance card.  Has [facility]HEUM appt today.  Counseling appt 2/2.  Did receive  genetic counseling 12/14.  Thinking about nutrition referral.   Patient verbalizes understanding of instructions and care plan provided today and agrees to view in [place]. Active MyChart status and patient understanding of how to access instructions and care plan via MyChart confirmed with patient.     The Managed Medicaid care management team will reach out to the patient again over the next 30 business  days.  The  Patient has been provided with contact information for the Managed Medicaid care management team and has been advised to call with any health related questions or concerns.   Aida Raider RN, BSN [place] Management Coordinator - Managed Medicaid High Risk [phone]   Following is a copy of your plan of care:  Care Plan : [place]  Updates made by [name], RN since 09/08/2022 12:00 AM     Problem: Chronic Disease Management and Care Coordination Needs      Long-Range Goal: Development of Plan Of Care For Chronic Disease Management and Care Coordination Needs To Assist With Meeting Treatment Goals   Start Date: 04/15/2021  Expected End Date: 11/03/2022  Priority: High  Note:   Current Barriers:  Care Coordination needs related eye and dental resources Chronic Disease Management support and education needs related to weight concerns, depression/anxiety RNCM Clinical Goal(s): Patient will verbalize basic understanding of weight management and depression disease process and self health management plan .  take all medications exactly as prescribed and will call provider for medication related questions attend all scheduled medical appointments: with primary care and specialist demonstrate ongoing adherence to prescribed treatment plan for  weight management and depression as evidenced by exercise 5-7 days/week adherence to prescribed medication regimen contacting provider for new or worsened symptoms or questions  . continue to work with RN Care Manager to address care management and care coordination needs related to weight management and depression work with Education officer, museum to address depression through collaboration with Consulting civil engineer, provider, and care tem.  Interventions Inter-disciplinary  care team collaboration (see longitudinal plan of care) Evaluation of current treatment plan related to  self management and patient's adherence to plan as established  by provider Collaborated with LCSW LCSW referral for depression-completed Collaborated with BSW BSW referral for eye and dental providers at patient request.  Weight Loss:  (Status: Goal on track: NO.) Provided patient with contact information for managed Medicaid health plan customer service phone number about weight management benefits  05/13/21- Reviewed weight management benefits offered by patient's health plan and again encouraged her to call customer service for details 08/04/22:  Discussed nutrition referral and/or Healthy Weight and Wellness-patient to f/u with provider 09/08/22:  patient considering nutrition referral.  PreDiabetes:  (Status: Goal on track: NO.) Lab Results  Component Value Date   HGBA1C 5.7 (H) 12/06/2019  Assessed patient's understanding of A1c goal:  <5.7% Provided education to patient about basic prediabetes disease process; Provided written information on prediabetes eating plan Counseled on importance of regular laboratory monitoring as prescribed;        Discussed plans with patient for ongoing care management follow up and provided patient with direct contact information for care management team;      09/08/22:  patient considering nutrition referral  Pain:  (Status: New goal. Goal on track: YES.) Pain assessment performed Medications reviewed Reviewed provider established plan for pain management; Discussed importance of adherence to all scheduled medical appointments; Counseled on the importance of  reporting any/all new or changed pain symptoms or management strategies to pain management provider; Reviewed with patient prescribed pharmacological and nonpharmacological pain relief strategies; Provided written health plan information related to alternative healing benefits such as  massage and acupuncture 05/13/21- Encouraged patient to review chronic pain education mailed to her last month 08/04/22:  No complaints of pain  Patient Goals/Self-Care Activities: Patient will self administer medications as prescribed  Patient will attend all scheduled provider appointments  Patient will review educational material mailed to her on 04/15/21 Patient will continue to perform ADL's independently Patient will continue to perform IADL's independently Patient will call provider office for new concerns or questions Patient will follow up with provider regarding medication and weight management

## 2022-09-09 LAB — URINALYSIS, ROUTINE W REFLEX MICROSCOPIC
Bilirubin Urine: NEGATIVE
Glucose, UA: NEGATIVE
Hgb urine dipstick: NEGATIVE
Ketones, ur: NEGATIVE
Leukocytes,Ua: NEGATIVE
Nitrite: NEGATIVE
Protein, ur: NEGATIVE
Specific Gravity, Urine: 1.025 (ref 1.001–1.035)
pH: 6.5 (ref 5.0–8.0)

## 2022-09-09 LAB — CBC WITH DIFFERENTIAL/PLATELET
Absolute Monocytes: 733 cells/uL (ref 200–950)
Basophils Absolute: 30 cells/uL (ref 0–200)
Basophils Relative: 0.3 %
Eosinophils Absolute: 198 cells/uL (ref 15–500)
Eosinophils Relative: 2 %
HCT: 36.4 % (ref 35.0–45.0)
Hemoglobin: 12.1 g/dL (ref 11.7–15.5)
Lymphs Abs: 3317 cells/uL (ref 850–3900)
MCH: 29 pg (ref 27.0–33.0)
MCHC: 33.2 g/dL (ref 32.0–36.0)
MCV: 87.3 fL (ref 80.0–100.0)
MPV: 10.9 fL (ref 7.5–12.5)
Monocytes Relative: 7.4 %
Neutro Abs: 5623 cells/uL (ref 1500–7800)
Neutrophils Relative %: 56.8 %
Platelets: 334 10*3/uL (ref 140–400)
RBC: 4.17 10*6/uL (ref 3.80–5.10)
RDW: 12.8 % (ref 11.0–15.0)
Total Lymphocyte: 33.5 %
WBC: 9.9 10*3/uL (ref 3.8–10.8)

## 2022-09-09 LAB — COMPLETE METABOLIC PANEL WITH GFR
AG Ratio: 1.4 (calc) (ref 1.0–2.5)
ALT: 8 U/L (ref 6–29)
AST: 10 U/L (ref 10–30)
Albumin: 4.2 g/dL (ref 3.6–5.1)
Alkaline phosphatase (APISO): 48 U/L (ref 31–125)
BUN: 14 mg/dL (ref 7–25)
CO2: 25 mmol/L (ref 20–32)
Calcium: 8.9 mg/dL (ref 8.6–10.2)
Chloride: 104 mmol/L (ref 98–110)
Creat: 0.76 mg/dL (ref 0.50–0.99)
Globulin: 3 g/dL (calc) (ref 1.9–3.7)
Glucose, Bld: 76 mg/dL (ref 65–99)
Potassium: 4 mmol/L (ref 3.5–5.3)
Sodium: 138 mmol/L (ref 135–146)
Total Bilirubin: 0.2 mg/dL (ref 0.2–1.2)
Total Protein: 7.2 g/dL (ref 6.1–8.1)
eGFR: 102 mL/min/{1.73_m2} (ref >=60)

## 2022-09-09 LAB — ANA: Anti Nuclear Antibody (ANA): NEGATIVE

## 2022-09-09 LAB — C3 AND C4
C3 Complement: 167 mg/dL (ref 83–193)
C4 Complement: 44 mg/dL (ref 15–57)

## 2022-09-09 LAB — SEDIMENTATION RATE: Sed Rate: 19 mm/h (ref 0–20)

## 2022-09-09 LAB — ANTI-DNA ANTIBODY, DOUBLE-STRANDED: ds DNA Ab: <1 IU/mL

## 2022-09-09 LAB — RNP ANTIBODY: Ribonucleic Protein(ENA) Antibody, IgG: 3.2 AI — AB

## 2022-09-09 NOTE — Progress Notes (Signed)
CBC and CMP WNL.  ESR WNL.  UA normal.  Complements WNL.

## 2022-09-10 NOTE — Progress Notes (Signed)
RNP remains positive.   ANA is negative.  dsDNA is negative.   No changes recommended at this time.

## 2022-09-11 ENCOUNTER — Other Ambulatory Visit: Payer: Medicaid Other

## 2022-09-11 NOTE — Patient Outreach (Signed)
Medicaid Managed Care Social Work Note  09/11/2022 Name:  Hannah Fleming MRN:  [id number] DOB:  Apr 20, 1981  Hannah Fleming is an 42 y.o. year old female who is a primary patient of [name], MD.  The [facility] Managed Care Coordination team was consulted for assistance with:   eye and dental resources  Ms. Pfeifer was given information about Medicaid Managed Care Coordination team services today. Hannah Fleming Patient agreed to services and verbal consent obtained.  Engaged with patient  for by telephone forinitial visit in response to referral for case management and/or care coordination services.   Assessments/Interventions:  Review of past medical history, allergies, medications, health status, including review of consultants reports, laboratory and other test data, was performed as part of comprehensive evaluation and provision of chronic care management services.  SDOH: (Social Determinant of Health) assessments and interventions performed: SDOH Interventions    Flowsheet Row Patient Outreach Telephone from 09/08/2022 in [place] Patient Outreach Telephone from 08/31/2022 in [place] Patient Outreach Telephone from 08/14/2022 in [place] Patient Outreach Telephone from 08/07/2022 in [place] Patient Outreach Telephone from 08/04/2022 in [place] Patient Outreach Telephone from 04/15/2021 in [place] Coordination  SDOH Interventions        Food Insecurity Interventions -- -- -- -- Intervention Not Indicated --  Housing Interventions -- -- -- -- -- --  [referred patient to [facility] BSW]  Transportation Interventions -- -- -- -- -- Intervention Not Indicated  Utilities Interventions Intervention Not Indicated -- -- -- -- --  Alcohol Usage Interventions Intervention Not Indicated (Score <7) -- -- --  -- --  Depression Interventions/Treatment  -- -- -- Counseling -- --  Financial Strain Interventions -- -- -- -- -- --  [referred to [facility] BSW]  Physical Activity Interventions -- -- -- -- -- Intervention Not Indicated  Stress Interventions -- Provide Counseling, [place], Provide Counseling -- --      BSW completed a telephone outreach with patient for eye resources. Patient stated she would also like dental resources in [facility]. Patient would like for theses resources to be emailed to [email or web address]. Resources have been sent. Advanced Directives Status:  Not addressed in this encounter.  Care Plan                 Allergies  Allergen Reactions   Prilosec Otc [Omeprazole Magnesium] Hives   Latex Itching and Rash    Medications Reviewed Today     Reviewed by Su Monks (Physician Assistant Certified) on 93/90/30 at [place] List Status: <None>   Medication Order Taking? Sig Documenting Provider Last Dose Status Informant  acetaminophen (TYLENOL) 500 MG tablet [id number] No Take 1,000 mg by mouth every 6 (six) hours as needed for mild pain or moderate pain.  Patient not taking: Reported on 08/03/2022   [provider] Not Taking Active Self  ibuprofen (ADVIL) 600 MG tablet [id number] Yes Take 1 tablet (600 mg total) by mouth every 6 (six) hours as needed. Constant, Peggy, MD Taking Active   metroNIDAZOLE (FLAGYL) 500 MG tablet [id number] No Take 1 tablet (500 mg total) by mouth 2 (two) times daily.  Patient not taking: Reported on 08/04/2022   Constant, Peggy, MD Not Taking Active   metroNIDAZOLE 1.3 % GEL [id number] No Place 1  Applicatorful vaginally 2 (two) times daily.  Patient not taking: Reported on 09/08/2022   [name], MD Not Taking Active   mirtazapine (REMERON) 15 MG tablet [id number] Yes Take 1 tablet (15 mg total)  by mouth at bedtime. [name], MD Taking Active   Norethindrone Acetate-Ethinyl Estrad-FE (LOESTRIN 24 FE) 1-20 MG-MCG(24) tablet [id number] Yes Take 1 tablet by mouth daily. Constant, Peggy, MD Taking Active   oxyCODONE-acetaminophen (PERCOCET/ROXICET) 5-325 MG tablet [id number] No Take 1 tablet by mouth every 6 (six) hours as needed.  Patient not taking: Reported on 08/03/2022   Constant, Peggy, MD Not Taking Active   Prenat-Fe Carbonyl-FA-Omega 3 (ONE-A-DAY WOMENS PRENATAL 1) 28-0.8-235 MG CAPS [id number] No Take 1 capsule by mouth daily.  Patient not taking: Reported on 08/03/2022   [provider] Not Taking Active Self  promethazine (PHENERGAN) 25 MG tablet [id number] No Take 1 tablet (25 mg total) by mouth every 6 (six) hours as needed for nausea or vomiting.  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   Bacterial vaginitis 11/27/2020   Candida vaginitis 11/27/2020   Prediabetes 06/21/2019   Ear itch 03/30/2019   Tonsillar calculus 03/30/2019   Menorrhagia with regular cycle 12/28/2013   Anxiety 02/07/2013   Dysmenorrhea 02/07/2013    Conditions to be addressed/monitored per PCP order:   eye and dental resources  Care Plan : LCSW Plan of Care  Updates made by Ethelda Chick since 09/11/2022 12:00 AM     Problem: Depression Identification (Depression)      Long-Range Goal: Depressive Symptoms Identified   Start Date: 08/07/2022  Note:   Priority: High  Timeframe:  Long-Range Goal Priority:  High Start Date:   08/07/22             Expected End Date:  ongoing                     Follow Up Date--09/14/22 at 915 am  - keep 90 percent of scheduled appointments -consider counseling or  psychiatry -consider bumping up your self-care  -consider creating a stronger support network   Why is this important?             Combatting depression may take some time.            If you don't feel better right away, don't give up on your treatment plan.    Current barriers:   Chronic Mental Health needs related to depression, stress and anxiety. Patient requires Support, Education, Resources, Referrals, Advocacy, and Care Coordination, in order to meet Unmet Mental Health Needs. Patient will implement clinical interventions discussed today to decrease symptoms of depression and increase knowledge and/or ability of: coping skills. Mental Health Concerns and Social Isolation Patient lacks knowledge of available community counseling agencies and resources.  Clinical Goal(s): verbalize understanding of plan for management of Anxiety, Depression, and Stress and demonstrate a reduction in symptoms. Patient will connect with a provider for ongoing mental health treatment, increase coping skills, healthy habits, self-management skills, and stress reduction        Clinical Interventions:  Assessed patient's previous and current treatment, coping skills, support system and barriers to care. Patient provided  hx Patient reports that she has had a lot of issues socially and personally and that has affected her mood. Patient is only interested in counseling at this time as PCP has prescribed her a new medication to help her mood. Verbalization of feelings encouraged, motivational interviewing employed Emotional support provided, positive coping strategies explored. Establishing healthy boundaries emphasized and healthy self-care education provided Patient was educated on available mental health resources within their area that accept Medicaid and offer counseling and psychiatry. Email sent to patient today with resource request- [place], [place]  [id number] [place] Medicare,  [place], [place];  BCBS ; [place], [place] , [place] as well as instructions for scheduling at [facility] as well as some crisis support resources and [facility]'s walk in clinic hours Patient is agreeable to referral to [facility] for counseling ONLY. [facility] LCSW made referral on 08/07/22. LCSW provided education on relaxation techniques such as meditation, deep breathing, massage, grounding exercises or yoga that can activate the body's relaxation response and ease symptoms of stress and anxiety. LCSW ask that when pt is struggling with difficult emotions and racing thoughts that they start this relaxation response process. LCSW provided extensive education on healthy coping skills for anxiety. SW used active and reflective listening, validated patient's feelings/concerns, and provided emotional support. Patient will work on implementing appropriate self-care habits into their daily routine such as: staying positive, writing a gratitude list, drinking water, staying active around the house, taking their medications as prescribed, combating negative thoughts or emotions and staying connected with their family and friends. Positive reinforcement provided for this decision to work on this. Motivational Interviewing employed Depression screen reviewed  PHQ2/ PHQ9 completed or reviewed  Mindfulness or Relaxation training provided Active listening / Reflection utilized  Advance Care and HCPOA education provided Emotional Support Provided Problem [place] strategies reviewed Provided psychoeducation for mental health needs  Provided brief CBT  Reviewed mental health medications and discussed importance of compliance:  Quality of sleep assessed & Sleep Hygiene techniques promoted  Participation in counseling encouraged  Verbalization of feelings encouraged  Suicidal Ideation/Homicidal Ideation assessed: Patient denies SI/HI  Review resources,  discussed options and provided patient information about  [place] care team collaboration (see longitudinal plan of care) 08/15/23 update-  patient reports that her mood has been relatively more stable lately. She was unable to confirm if she received email that [facility] LCSW sent so [facility] LCSW sent additional email today and had patient write down contact information for [facility] in order for her to contact them to schedule her initial counseling session. Brief self-care education provided as well. [facility] LCSW will follow up after the new year. Update- Patient reports that she was able to start counseling with a LCSW at her PCP's office so she no longer is in need of the counseling referral [facility] LCSW made for her. Patient is wanting more time to consider a psychiatry referral. She reports that she is having issues with her current medication that her PCP prescribed because it makes her very sleepy. [facility] LCSW will follow up in a few weeks to see if she has come to a decision about implementing psychiatry services. Email sent to patient with resources as well. BSW completed a telephone outreach with patient for eye resources. Patient stated she would also like dental resources in [facility]. Patient would like for theses resources to be emailed to [email or web address]. Resources have been sent. Patient  Goals/Self-Care Activities: Over the next 120 days Attend scheduled medical appointments Utilize healthy coping skills and supportive resources discussed Contact PCP with any questions or concerns Keep 90 percent of counseling appointments Call your insurance provider for more information about your Enhanced Benefits  Check out counseling resources provided  Begin personal counseling with LCSW, to reduce and manage symptoms of Depression and Stress, until well-established with mental health provider Accept all calls from representative with [facility] in an effort to establish  ongoing mental health counseling and supportive services. Incorporate into daily practice - relaxation techniques, deep breathing exercises, and mindfulness meditation strategies. Talk about feelings with friends, family members, spiritual advisor, etc. Contact LCSW directly [phone]), if you have questions, need assistance, or if additional social work needs are identified between now and our next scheduled telephone outreach call. Call [phone] for mental health hotline/crisis line if needed (24/7 available) Try techniques to reduce symptoms of anxiety/negative thinking (deep breathing, distraction, positive self talk, etc)  - develop a personal safety plan - develop a plan to deal with triggers like holidays, anniversaries - exercise at least 2 to 3 times per week - have a plan for how to handle bad days - journal feelings and what helps to feel better or worse - spend time or talk with others at least 2 to 3 times per week - watch for early signs of feeling worse - begin personal counseling - call and visit an old friend - check out volunteer opportunities - join a support group - laugh; watch a funny movie or comedian - learn and use visualization or guided imagery - perform a random act of kindness - practice relaxation or meditation daily - start or continue a personal journal - practice positive thinking and self-talk -continue with compliance of taking medication  -identify current effective and ineffective coping strategies.  -implement positive self-talk in care to increase self-esteem, confidence and feelings of control.  -consider alternative and complementary therapy approaches such as meditation, mindfulness or yoga.  -journaling, prayer, worship services, meditation or pastoral counseling.  -increase participation in pleasurable group activities such as hobbies, singing, sports or volunteering).  -consider the use of meditative movement therapy such as tai chi, yoga or  qigong.  -start a regular daily exercise program based on tolerance, ability and patient choice to support positive thinking and activity    10 LITTLE Things To Do When You're Feeling Too Down To Do Anything  Take a shower. Even if you plan to stay in all day long and not see a soul, take a shower. It takes the most effort to hop in to the shower but once you do, you'll feel immediate results. It will wake you up and you'll be feeling much fresher (and cleaner too).  Brush and floss your teeth. Give your teeth a good brushing with a floss finish. It's a small task but it feels so good and you can check 'taking care of your health' off the list of things to do.  Do something small on your list. Most of [place] have some small thing we would like to get done (load of laundry, sew a button, email a friend). Doing one of these things will make you feel like you've accomplished something.  Drink water. Drinking water is easy right? It's also really beneficial for your health so keep a glass beside you all day and take sips often. It gives you energy and prevents you from boredom eating.  Do some floor exercises. The last  thing you want to do is exercise but it might be just the thing you need the most. Keep it simple and do exercises that involve sitting or laying on the floor. Even the smallest of exercises release chemicals in the brain that make you feel good. Yoga stretches or core exercises are going to make you feel good with minimal effort.  Make your bed. Making your bed takes a few minutes but it's productive and you'll feel relieved when it's done. An unmade bed is a huge visual reminder that you're having an unproductive day. Do it and consider it your housework for the day.  Put on some nice clothes. Take the sweatpants off even if you don't plan to go anywhere. Put on clothes that make you feel good. Take a look in the mirror so your brain recognizes the sweatpants have been replaced with  clothes that make you look great. It's an instant confidence booster.  Wash the dishes. A pile of dirty dishes in the sink is a reflection of your mood. It's possible that if you wash up the dishes, your mood will follow suit. It's worth a try.  Cook a real meal. If you have the luxury to have a "do nothing" day, you have time to make a real meal for yourself. Make a meal that you love to eat. The process is good to get you out of the funk and the food will ensure you have more energy for tomorrow.  Write out your thoughts by hand. When you hand write, you stimulate your brain to focus on the moment that you're in so make yourself comfortable and write whatever comes into your mind. Put those thoughts out on paper so they stop spinning around in your head. Those thoughts might be the very thing holding you down.  The following coping skill education was provided for stress relief and mental health management: "When your car dies or a deadline looms, how do you respond? Long-term, low-grade or acute stress takes a serious toll on your body and mind, so don't ignore feelings of constant tension. Stress is a natural part of life. However, too much stress can harm our health, especially if it continues every day. This is chronic stress and can put you at risk for heart problems like heart disease and depression. Understand what's happening inside your body and learn simple coping skills to combat the negative impacts of everyday stressors.  Types of Stress There are two types of stress: Emotional - types of emotional stress are relationship problems, pressure at work, financial worries, experiencing discrimination or having a major life change. Physical - Examples of physical stress include being sick having pain, not sleeping well, recovery from an injury or having an alcohol and drug use disorder. Fight or Flight Sudden or ongoing stress activates your nervous system and floods your bloodstream with  adrenaline and cortisol, two hormones that raise blood pressure, increase heart rate and spike blood sugar. These changes pitch your body into a fight or flight response. That enabled our ancestors to outrun saber-toothed tigers, and it's helpful today for situations like dodging a car accident. But most modern chronic stressors, such as finances or a challenging relationship, keep your body in that heightened state, which hurts your health. Effects of Too Much Stress If constantly under stress, most of [place] will eventually start to function less well.  Multiple studies link chronic stress to a higher risk of heart disease, stroke, depression, weight gain, memory loss and  even premature death, so it's important to recognize the warning signals. Talk to your doctor about ways to manage stress if you're experiencing any of these symptoms: Prolonged periods of poor sleep. Regular, severe headaches. Unexplained weight loss or gain. Feelings of isolation, withdrawal or worthlessness. Constant anger and irritability. Loss of interest in activities. Constant worrying or obsessive thinking. Excessive alcohol or drug use. Inability to concentrate.  10 Ways to Cope with Chronic Stress It's key to recognize stressful situations as they occur because it allows you to focus on managing how you react. We all need to know when to close our eyes and take a deep breath when we feel tension rising. Use these tips to prevent or reduce chronic stress. 1. Rebalance Work and Home All work and no play? If you're spending too much time at the office, intentionally put more dates in your calendar to enjoy time for fun, either alone or with others. 2. Get Regular Exercise Moving your body on a regular basis balances the nervous system and increases blood circulation, helping to flush out stress hormones. Even a daily 20-minute walk makes a difference. Any kind of exercise can lower stress and improve your mood ? just pick  activities that you enjoy and make it a regular habit. 3. Eat Well and Limit Alcohol and Stimulants Alcohol, nicotine and caffeine may temporarily relieve stress but have negative health impacts and can make stress worse in the long run. Well-nourished bodies cope better, so start with a good breakfast, add more organic fruits and vegetables for a well-balanced diet, avoid processed foods and sugar, try herbal tea and drink more water. 4. Connect with Supportive People Talking face to face with another person releases hormones that reduce stress. Lean on those good listeners in your life. 5. [place] Time Do you enjoy gardening, reading, listening to music or some other creative pursuit? Engage in activities that bring you pleasure and joy; research shows that reduces stress by almost half and lowers your heart rate, too. 6. Practice Meditation, Stress Reduction or Yoga Relaxation techniques activate a state of restfulness that counterbalances your body's fight-or-flight hormones. Even if this also means a 10-minute break in a long day: listen to music, read, go for a walk in nature, do a hobby, take a bath or spend time with a friend. Also consider doing a mindfulness exercise or try a daily deep breathing or imagery practice. Deep Breathing Slow, calm and deep breathing can help you relax. Try these steps to focus on your breathing and repeat as needed. Find a comfortable position and close your eyes. Exhale and drop your shoulders. Breathe in through your nose; fill your lungs and then your belly. Think of relaxing your body, quieting your mind and becoming calm and peaceful. Breathe out slowly through your nose, relaxing your belly. Think of releasing tension, pain, worries or distress. Repeat steps three and four until you feel relaxed. Imagery This involves using your mind to excite the senses -- sound, vision, smell, taste and feeling. This may help ease your stress. Begin by getting  comfortable and then do some slow breathing. Imagine a place you love being at. It could be somewhere from your childhood, somewhere you vacationed or just a place in your imagination. Feel how it is to be in the place you're imagining. Pay attention to the sounds, air, colors, and who is there with you. This is a place where you feel cared for and loved. All is well. You  are safe. Take in all the smells, sounds, tastes and feelings. As you do, feel your body being nourished and healed. Feel the calm that surrounds you. Breathe in all the good. Breathe out any discomfort or tension. 7. Sleep Enough If you get less than seven to eight hours of sleep, your body won't tolerate stress as well as it could. If stress keeps you up at night, address the cause, and add extra meditation into your day to make up for the lost z's. Try to get seven to nine hours of sleep each night. Make a regular bedtime schedule. Keep your room dark and cool. Try to avoid computers, TV, cell phones and tablets before bed. 8. Bond with Connections You Enjoy Go out for a coffee with a friend, chat with a neighbor, call a family member, visit with a clergy member, or even hang out with your pet. Clinical studies show that spending even a short time with a companion animal can cut anxiety levels almost in half. 9. Take a Vacation Getting away from it all can reset your stress tolerance by increasing your mental and emotional outlook, which makes you a happier, more productive person upon return. Leave your cellphone and laptop at home! 10. See a Counselor, Coach or Therapist If negative thoughts overwhelm your ability to make positive changes, it's time to seek professional help. Make an appointment today--your health and life are worth it."  If you are experiencing a Mental Health or [place] or need someone to talk to, please call the Suicide and Crisis Lifeline: 988    Patient Goals: Follow up goal      Follow up:  Patient agrees to Care Plan and Follow-up.  Plan: The Managed Medicaid care management team will reach out to the patient again over the next 30 days.  Date/time of next scheduled Social Work care management/care coordination outreach:  10/12/22  Mickel Fuchs, Arita Miss, [place] Medicaid Team  [phone]

## 2022-09-11 NOTE — Patient Instructions (Signed)
Visit Information  [name] was given information about Medicaid Managed Care team care coordination services as a part of their [place] Medicaid benefit. Hannah Fleming verbally consented to engagement with the [facility] Managed Care team.   If you are experiencing a medical emergency, please call 911 or report to your local emergency department or urgent care.   If you have a non-emergency medical problem during routine business hours, please contact your provider's office and ask to speak with a nurse.   For questions related to your [facility], please call: [phone] or visit the homepage here: [email or web address]  If you would like to schedule transportation through your [facility], please call the following number at least 2 days in advance of your appointment: [phone]   Rides for urgent appointments can also be made after hours by calling Member Services.  Call the [place] at [phone], at any time, 24 hours a day, 7 days a week. If you are in danger or need immediate medical attention call 911.  If you would like help to quit smoking, call 1-800-QUIT-NOW 561-326-0969) OR Espaol: 1-855-Djelo-Ya (2-595-638-7564) o para ms informacin haga clic aqu or Text READY to 200-400 to register via text  [name] - following are the goals we discussed in your visit today:   Goals Addressed   None      Social Worker will follow up on 10/12/22.   Hannah Fleming, BSW, [place]  High Risk Managed Medicaid Team  [phone]   Following is a copy of your plan of care:  Care Plan : LCSW Plan of Care  Updates made by Hannah Fleming since 09/11/2022 12:00 AM     Problem: Depression Identification (Depression)      Long-Range Goal: Depressive Symptoms Identified   Start Date:  08/07/2022  Note:   Priority: High  Timeframe:  Long-Range Goal Priority:  High Start Date:   08/07/22             Expected End Date:  ongoing                     Follow Up Date--09/14/22 at 81 am  - keep 90 percent of scheduled appointments -consider counseling or psychiatry -consider bumping up your self-care  -consider creating a stronger support network   Why is this important?             Combatting depression may take some time.            If you don't feel better right away, don't give up on your treatment plan.    Current barriers:   Chronic Mental Health needs related to depression, stress and anxiety. Patient requires Support, Education, Resources, Referrals, Advocacy, and Care Coordination, in order to meet Unmet Mental Health Needs. Patient will implement clinical interventions discussed today to decrease symptoms of depression and increase knowledge and/or ability of: coping skills. Mental Health Concerns and Social Isolation Patient lacks knowledge of available community counseling agencies and resources.  Clinical Goal(s): verbalize understanding of plan for management of Anxiety, Depression, and Stress and demonstrate a reduction in symptoms. Patient will connect with a provider for ongoing mental health treatment, increase coping skills, healthy habits, self-management skills, and stress reduction        Clinical Interventions:  Assessed patient's previous and current treatment, coping skills, support system and barriers to care. Patient provided  hx Patient reports that she has had a lot of issues socially and personally and that has affected her mood. Patient is only interested in counseling at this time as PCP has prescribed her a new medication to help her mood. Verbalization of feelings encouraged, motivational interviewing employed Emotional support provided, positive coping strategies explored. Establishing healthy boundaries emphasized and healthy self-care  education provided Patient was educated on available mental health resources within their area that accept Medicaid and offer counseling and psychiatry. Email sent to patient today with resource request- [place]  [place], [place] [address] [place] Medicare,  [place], [place];  [employer] ; [place], [place] , [place] as well as instructions for scheduling at [facility] as well as some crisis support resources and [facility]'s walk in clinic hours Patient is agreeable to referral to [facility] for counseling ONLY. [facility] LCSW made referral on 08/07/22. LCSW provided education on relaxation techniques such as meditation, deep breathing, massage, grounding exercises or yoga that can activate the body's relaxation response and ease symptoms of stress and anxiety. LCSW ask that when pt is struggling with difficult emotions and racing thoughts that they start this relaxation response process. LCSW provided extensive education on healthy coping skills for anxiety. SW used active and reflective listening, validated patient's feelings/concerns, and provided emotional support. Patient will work on implementing appropriate self-care habits into their daily routine such as: staying positive, writing a gratitude list, drinking water, staying active around the house, taking their medications as prescribed, combating negative thoughts or emotions and staying connected with their family and friends. Positive reinforcement provided for this decision to work on this. Motivational Interviewing employed Depression screen reviewed  PHQ2/ PHQ9 completed or reviewed  Mindfulness or Relaxation training provided Active listening / Reflection utilized  Advance Care and HCPOA education provided Emotional Support Provided Problem [place] strategies reviewed Provided psychoeducation for mental health needs  Provided brief CBT  Reviewed mental health medications  and discussed importance of compliance:  Quality of sleep assessed & Sleep Hygiene techniques promoted  Participation in counseling encouraged  Verbalization of feelings encouraged  Suicidal Ideation/Homicidal Ideation assessed: Patient denies SI/HI  Review resources, discussed options and provided patient information about  [place] care team collaboration (see longitudinal plan of care) 08/15/23 update-  patient reports that her mood has been relatively more stable lately. She was unable to confirm if she received email that [facility] LCSW sent so [facility] LCSW sent additional email today and had patient write down contact information for [facility] in order for her to contact them to schedule her initial counseling session. Brief self-care education provided as well. [facility] LCSW will follow up after the new year. Update- Patient reports that she was able to start counseling with a LCSW at her PCP's office so she no longer is in need of the counseling referral [facility] LCSW made for her. Patient is wanting more time to consider a psychiatry referral. She reports that she is having issues with her current medication that her PCP prescribed because it makes her very sleepy. [facility] LCSW will follow up in a few weeks to see if she has come to a decision about implementing psychiatry services. Email sent to patient with resources as well. BSW completed a telephone outreach with patient for eye resources. Patient stated she would also like dental resources in [facility]. Patient would like for theses resources to be emailed to [email or web address]. Resources have been sent. Patient  Goals/Self-Care Activities: Over the next 120 days Attend scheduled medical appointments Utilize healthy coping skills and supportive resources discussed Contact PCP with any questions or concerns Keep 90 percent of counseling appointments Call your insurance provider for more information about your Enhanced  Benefits  Check out counseling resources provided  Begin personal counseling with LCSW, to reduce and manage symptoms of Depression and Stress, until well-established with mental health provider Accept all calls from representative with [facility] in an effort to establish ongoing mental health counseling and supportive services. Incorporate into daily practice - relaxation techniques, deep breathing exercises, and mindfulness meditation strategies. Talk about feelings with friends, family members, spiritual advisor, etc. Contact LCSW directly [phone]), if you have questions, need assistance, or if additional social work needs are identified between now and our next scheduled telephone outreach call. Call 988 for mental health hotline/crisis line if needed (24/7 available) Try techniques to reduce symptoms of anxiety/negative thinking (deep breathing, distraction, positive self talk, etc)  - develop a personal safety plan - develop a plan to deal with triggers like holidays, anniversaries - exercise at least 2 to 3 times per week - have a plan for how to handle bad days - journal feelings and what helps to feel better or worse - spend time or talk with others at least 2 to 3 times per week - watch for early signs of feeling worse - begin personal counseling - call and visit an old friend - check out volunteer opportunities - join a support group - laugh; watch a funny movie or comedian - learn and use visualization or guided imagery - perform a random act of kindness - practice relaxation or meditation daily - start or continue a personal journal - practice positive thinking and self-talk -continue with compliance of taking medication  -identify current effective and ineffective coping strategies.  -implement positive self-talk in care to increase self-esteem, confidence and feelings of control.  -consider alternative and complementary therapy approaches such as meditation, mindfulness  or yoga.  -journaling, prayer, worship services, meditation or pastoral counseling.  -increase participation in pleasurable group activities such as hobbies, singing, sports or volunteering).  -consider the use of meditative movement therapy such as tai chi, yoga or qigong.  -start a regular daily exercise program based on tolerance, ability and patient choice to support positive thinking and activity    10 LITTLE Things To Do When You're Feeling Too Down To Do Anything  Take a shower. Even if you plan to stay in all day long and not see a soul, take a shower. It takes the most effort to hop in to the shower but once you do, you'll feel immediate results. It will wake you up and you'll be feeling much fresher (and cleaner too).  Brush and floss your teeth. Give your teeth a good brushing with a floss finish. It's a small task but it feels so good and you can check 'taking care of your health' off the list of things to do.  Do something small on your list. Most of [place] have some small thing we would like to get done (load of laundry, sew a button, email a friend). Doing one of these things will make you feel like you've accomplished something.  Drink water. Drinking water is easy right? It's also really beneficial for your health so keep a glass beside you all day and take sips often. It gives you energy and prevents you from boredom eating.  Do some floor exercises. The last  thing you want to do is exercise but it might be just the thing you need the most. Keep it simple and do exercises that involve sitting or laying on the floor. Even the smallest of exercises release chemicals in the brain that make you feel good. Yoga stretches or core exercises are going to make you feel good with minimal effort.  Make your bed. Making your bed takes a few minutes but it's productive and you'll feel relieved when it's done. An unmade bed is a huge visual reminder that you're having an unproductive day. Do  it and consider it your housework for the day.  Put on some nice clothes. Take the sweatpants off even if you don't plan to go anywhere. Put on clothes that make you feel good. Take a look in the mirror so your brain recognizes the sweatpants have been replaced with clothes that make you look great. It's an instant confidence booster.  Wash the dishes. A pile of dirty dishes in the sink is a reflection of your mood. It's possible that if you wash up the dishes, your mood will follow suit. It's worth a try.  Cook a real meal. If you have the luxury to have a "do nothing" day, you have time to make a real meal for yourself. Make a meal that you love to eat. The process is good to get you out of the funk and the food will ensure you have more energy for tomorrow.  Write out your thoughts by hand. When you hand write, you stimulate your brain to focus on the moment that you're in so make yourself comfortable and write whatever comes into your mind. Put those thoughts out on paper so they stop spinning around in your head. Those thoughts might be the very thing holding you down.  The following coping skill education was provided for stress relief and mental health management: "When your car dies or a deadline looms, how do you respond? Long-term, low-grade or acute stress takes a serious toll on your body and mind, so don't ignore feelings of constant tension. Stress is a natural part of life. However, too much stress can harm our health, especially if it continues every day. This is chronic stress and can put you at risk for heart problems like heart disease and depression. Understand what's happening inside your body and learn simple coping skills to combat the negative impacts of everyday stressors.  Types of Stress There are two types of stress: Emotional - types of emotional stress are relationship problems, pressure at work, financial worries, experiencing discrimination or having a major life  change. Physical - Examples of physical stress include being sick having pain, not sleeping well, recovery from an injury or having an alcohol and drug use disorder. Fight or Flight Sudden or ongoing stress activates your nervous system and floods your bloodstream with adrenaline and cortisol, two hormones that raise blood pressure, increase heart rate and spike blood sugar. These changes pitch your body into a fight or flight response. That enabled our ancestors to outrun saber-toothed tigers, and it's helpful today for situations like dodging a car accident. But most modern chronic stressors, such as finances or a challenging relationship, keep your body in that heightened state, which hurts your health. Effects of Too Much Stress If constantly under stress, most of [place] will eventually start to function less well.  Multiple studies link chronic stress to a higher risk of heart disease, stroke, depression, weight gain, memory loss and  even premature death, so it's important to recognize the warning signals. Talk to your doctor about ways to manage stress if you're experiencing any of these symptoms: Prolonged periods of poor sleep. Regular, severe headaches. Unexplained weight loss or gain. Feelings of isolation, withdrawal or worthlessness. Constant anger and irritability. Loss of interest in activities. Constant worrying or obsessive thinking. Excessive alcohol or drug use. Inability to concentrate.  10 Ways to Cope with Chronic Stress It's key to recognize stressful situations as they occur because it allows you to focus on managing how you react. We all need to know when to close our eyes and take a deep breath when we feel tension rising. Use these tips to prevent or reduce chronic stress. 1. Rebalance Work and Home All work and no play? If you're spending too much time at the office, intentionally put more dates in your calendar to enjoy time for fun, either alone or with others. 2. Get  Regular Exercise Moving your body on a regular basis balances the nervous system and increases blood circulation, helping to flush out stress hormones. Even a daily 20-minute walk makes a difference. Any kind of exercise can lower stress and improve your mood ? just pick activities that you enjoy and make it a regular habit. 3. Eat Well and Limit Alcohol and Stimulants Alcohol, nicotine and caffeine may temporarily relieve stress but have negative health impacts and can make stress worse in the long run. Well-nourished bodies cope better, so start with a good breakfast, add more organic fruits and vegetables for a well-balanced diet, avoid processed foods and sugar, try herbal tea and drink more water. 4. Connect with Supportive People Talking face to face with another person releases hormones that reduce stress. Lean on those good listeners in your life. 5. [place] Time Do you enjoy gardening, reading, listening to music or some other creative pursuit? Engage in activities that bring you pleasure and joy; research shows that reduces stress by almost half and lowers your heart rate, too. 6. Practice Meditation, Stress Reduction or Yoga Relaxation techniques activate a state of restfulness that counterbalances your body's fight-or-flight hormones. Even if this also means a 10-minute break in a long day: listen to music, read, go for a walk in nature, do a hobby, take a bath or spend time with a friend. Also consider doing a mindfulness exercise or try a daily deep breathing or imagery practice. Deep Breathing Slow, calm and deep breathing can help you relax. Try these steps to focus on your breathing and repeat as needed. Find a comfortable position and close your eyes. Exhale and drop your shoulders. Breathe in through your nose; fill your lungs and then your belly. Think of relaxing your body, quieting your mind and becoming calm and peaceful. Breathe out slowly through your nose, relaxing  your belly. Think of releasing tension, pain, worries or distress. Repeat steps three and four until you feel relaxed. Imagery This involves using your mind to excite the senses -- sound, vision, smell, taste and feeling. This may help ease your stress. Begin by getting comfortable and then do some slow breathing. Imagine a place you love being at. It could be somewhere from your childhood, somewhere you vacationed or just a place in your imagination. Feel how it is to be in the place you're imagining. Pay attention to the sounds, air, colors, and who is there with you. This is a place where you feel cared for and loved. All is well. You  are safe. Take in all the smells, sounds, tastes and feelings. As you do, feel your body being nourished and healed. Feel the calm that surrounds you. Breathe in all the good. Breathe out any discomfort or tension. 7. Sleep Enough If you get less than seven to eight hours of sleep, your body won't tolerate stress as well as it could. If stress keeps you up at night, address the cause, and add extra meditation into your day to make up for the lost z's. Try to get seven to nine hours of sleep each night. Make a regular bedtime schedule. Keep your room dark and cool. Try to avoid computers, TV, cell phones and tablets before bed. 8. Bond with Connections You Enjoy Go out for a coffee with a friend, chat with a neighbor, call a family member, visit with a clergy member, or even hang out with your pet. Clinical studies show that spending even a short time with a companion animal can cut anxiety levels almost in half. 9. Take a Vacation Getting away from it all can reset your stress tolerance by increasing your mental and emotional outlook, which makes you a happier, more productive person upon return. Leave your cellphone and laptop at home! 10. See a Counselor, Coach or Therapist If negative thoughts overwhelm your ability to make positive changes, it's time to seek  professional help. Make an appointment today--your health and life are worth it."  If you are experiencing a Mental Health or [place] or need someone to talk to, please call the Suicide and Crisis Lifeline: 988    Patient Goals: Follow up goal

## 2022-09-14 ENCOUNTER — Other Ambulatory Visit: Payer: Medicaid Other | Admitting: Licensed Clinical Social Worker

## 2022-09-14 NOTE — Patient Instructions (Signed)
Visit Information  Hannah Fleming was given information about Medicaid Managed Care team care coordination services as a part of their [place] [employer] benefit. Hannah Fleming verbally consented to engagement with the [facility] Managed Care team.   If you are experiencing a medical emergency, please call 911 or report to your local emergency department or urgent care.   If you have a non-emergency medical problem during routine business hours, please contact your provider's office and ask to speak with a nurse.   For questions related to your [facility], please call: [phone] or visit the homepage here: [email or web address]  If you would like to schedule transportation through your [facility], please call the following number at least 2 days in advance of your appointment: [phone]   Rides for urgent appointments can also be made after hours by calling Member Services.  Call the [place] at [phone], at any time, 24 hours a day, 7 days a week. If you are in danger or need immediate medical attention call 911.  If you would like help to quit smoking, call 1-800-QUIT-NOW 910-328-5424) OR Espaol: 1-855-Djelo-Ya (1-443-154-0086) o para ms informacin haga clic aqu or Text READY to 200-400 to register via text   Following is a copy of your plan of care:  Care Plan : LCSW Plan of Care  Updates made by Greg Cutter, LCSW since 09/14/2022 12:00 AM     Problem: Depression Identification (Depression)      Long-Range Goal: Depressive Symptoms Identified   Start Date: 08/07/2022  Note:   Priority: High  Timeframe:  Long-Range Goal Priority:  High Start Date:   08/07/22             Expected End Date:  ongoing                     Follow Up Date--Goal completed on 09/14/22  - keep 90 percent of scheduled  appointments -consider counseling or psychiatry -consider bumping up your self-care  -consider creating a stronger support network   Why is this important?             Combatting depression may take some time.            If you don't feel better right away, don't give up on your treatment plan.    Current barriers:   Chronic Mental Health needs related to depression, stress and anxiety. Patient requires Support, Education, Resources, Referrals, Advocacy, and Care Coordination, in order to meet Unmet Mental Health Needs. Patient will implement clinical interventions discussed today to decrease symptoms of depression and increase knowledge and/or ability of: coping skills. Mental Health Concerns and Social Isolation Patient lacks knowledge of available community counseling agencies and resources.  Clinical Goal(s): verbalize understanding of plan for management of Anxiety, Depression, and Stress and demonstrate a reduction in symptoms. Patient will connect with a provider for ongoing mental health treatment, increase coping skills, healthy habits, self-management skills, and stress reduction        Patient Goals/Self-Care Activities: Over the next 120 days Attend scheduled medical appointments Utilize healthy coping skills and supportive resources discussed Contact PCP with any questions or concerns Keep 90 percent of counseling appointments Call your insurance provider for more information about your Enhanced Benefits  Check out counseling resources provided  Begin personal counseling with LCSW, to reduce and manage symptoms of Depression and Stress, until well-established with mental health provider Accept  all calls from representative with [facility] in an effort to establish ongoing mental health counseling and supportive services. Incorporate into daily practice - relaxation techniques, deep breathing exercises, and mindfulness meditation strategies. Talk about feelings with friends, family  members, spiritual advisor, etc. Contact LCSW directly [phone]), if you have questions, need assistance, or if additional social work needs are identified between now and our next scheduled telephone outreach call. Call 988 for mental health hotline/crisis line if needed (24/7 available) Try techniques to reduce symptoms of anxiety/negative thinking (deep breathing, distraction, positive self talk, etc)  - develop a personal safety plan - develop a plan to deal with triggers like holidays, anniversaries - exercise at least 2 to 3 times per week - have a plan for how to handle bad days - journal feelings and what helps to feel better or worse - spend time or talk with others at least 2 to 3 times per week - watch for early signs of feeling worse - begin personal counseling - call and visit an old friend - check out volunteer opportunities - join a support group - laugh; watch a funny movie or comedian - learn and use visualization or guided imagery - perform a random act of kindness - practice relaxation or meditation daily - start or continue a personal journal - practice positive thinking and self-talk -continue with compliance of taking medication  -identify current effective and ineffective coping strategies.  -implement positive self-talk in care to increase self-esteem, confidence and feelings of control.  -consider alternative and complementary therapy approaches such as meditation, mindfulness or yoga.  -journaling, prayer, worship services, meditation or pastoral counseling.  -increase participation in pleasurable group activities such as hobbies, singing, sports or volunteering).  -consider the use of meditative movement therapy such as tai chi, yoga or qigong.  -start a regular daily exercise program based on tolerance, ability and patient choice to support positive thinking and activity    10 LITTLE Things To Do When You're Feeling Too Down To Do Anything  Take a  shower. Even if you plan to stay in all day long and not see a soul, take a shower. It takes the most effort to hop in to the shower but once you do, you'll feel immediate results. It will wake you up and you'll be feeling much fresher (and cleaner too).  Brush and floss your teeth. Give your teeth a good brushing with a floss finish. It's a small task but it feels so good and you can check 'taking care of your health' off the list of things to do.  Do something small on your list. Most of [place] have some small thing we would like to get done (load of laundry, sew a button, email a friend). Doing one of these things will make you feel like you've accomplished something.  Drink water. Drinking water is easy right? It's also really beneficial for your health so keep a glass beside you all day and take sips often. It gives you energy and prevents you from boredom eating.  Do some floor exercises. The last thing you want to do is exercise but it might be just the thing you need the most. Keep it simple and do exercises that involve sitting or laying on the floor. Even the smallest of exercises release chemicals in the brain that make you feel good. Yoga stretches or core exercises are going to make you feel good with minimal effort.  Make your bed. Making your bed takes a  few minutes but it's productive and you'll feel relieved when it's done. An unmade bed is a huge visual reminder that you're having an unproductive day. Do it and consider it your housework for the day.  Put on some nice clothes. Take the sweatpants off even if you don't plan to go anywhere. Put on clothes that make you feel good. Take a look in the mirror so your brain recognizes the sweatpants have been replaced with clothes that make you look great. It's an instant confidence booster.  Wash the dishes. A pile of dirty dishes in the sink is a reflection of your mood. It's possible that if you wash up the dishes, your mood will  follow suit. It's worth a try.  Cook a real meal. If you have the luxury to have a "do nothing" day, you have time to make a real meal for yourself. Make a meal that you love to eat. The process is good to get you out of the funk and the food will ensure you have more energy for tomorrow.  Write out your thoughts by hand. When you hand write, you stimulate your brain to focus on the moment that you're in so make yourself comfortable and write whatever comes into your mind. Put those thoughts out on paper so they stop spinning around in your head. Those thoughts might be the very thing holding you down.  The following coping skill education was provided for stress relief and mental health management: "When your car dies or a deadline looms, how do you respond? Long-term, low-grade or acute stress takes a serious toll on your body and mind, so don't ignore feelings of constant tension. Stress is a natural part of life. However, too much stress can harm our health, especially if it continues every day. This is chronic stress and can put you at risk for heart problems like heart disease and depression. Understand what's happening inside your body and learn simple coping skills to combat the negative impacts of everyday stressors.  Types of Stress There are two types of stress: Emotional - types of emotional stress are relationship problems, pressure at work, financial worries, experiencing discrimination or having a major life change. Physical - Examples of physical stress include being sick having pain, not sleeping well, recovery from an injury or having an alcohol and drug use disorder. Fight or Flight Sudden or ongoing stress activates your nervous system and floods your bloodstream with adrenaline and cortisol, two hormones that raise blood pressure, increase heart rate and spike blood sugar. These changes pitch your body into a fight or flight response. That enabled our ancestors to outrun  saber-toothed tigers, and it's helpful today for situations like dodging a car accident. But most modern chronic stressors, such as finances or a challenging relationship, keep your body in that heightened state, which hurts your health. Effects of Too Much Stress If constantly under stress, most of [place] will eventually start to function less well.  Multiple studies link chronic stress to a higher risk of heart disease, stroke, depression, weight gain, memory loss and even premature death, so it's important to recognize the warning signals. Talk to your doctor about ways to manage stress if you're experiencing any of these symptoms: Prolonged periods of poor sleep. Regular, severe headaches. Unexplained weight loss or gain. Feelings of isolation, withdrawal or worthlessness. Constant anger and irritability. Loss of interest in activities. Constant worrying or obsessive thinking. Excessive alcohol or drug use. Inability to concentrate.  10 Ways  to Cope with Chronic Stress It's key to recognize stressful situations as they occur because it allows you to focus on managing how you react. We all need to know when to close our eyes and take a deep breath when we feel tension rising. Use these tips to prevent or reduce chronic stress. 1. Rebalance Work and Home All work and no play? If you're spending too much time at the office, intentionally put more dates in your calendar to enjoy time for fun, either alone or with others. 2. Get Regular Exercise Moving your body on a regular basis balances the nervous system and increases blood circulation, helping to flush out stress hormones. Even a daily 20-minute walk makes a difference. Any kind of exercise can lower stress and improve your mood ? just pick activities that you enjoy and make it a regular habit. 3. Eat Well and Limit Alcohol and Stimulants Alcohol, nicotine and caffeine may temporarily relieve stress but have negative health impacts and can make  stress worse in the long run. Well-nourished bodies cope better, so start with a good breakfast, add more organic fruits and vegetables for a well-balanced diet, avoid processed foods and sugar, try herbal tea and drink more water. 4. Connect with Supportive People Talking face to face with another person releases hormones that reduce stress. Lean on those good listeners in your life. 5. [place] Time Do you enjoy gardening, reading, listening to music or some other creative pursuit? Engage in activities that bring you pleasure and joy; research shows that reduces stress by almost half and lowers your heart rate, too. 6. Practice Meditation, Stress Reduction or Yoga Relaxation techniques activate a state of restfulness that counterbalances your body's fight-or-flight hormones. Even if this also means a 10-minute break in a long day: listen to music, read, go for a walk in nature, do a hobby, take a bath or spend time with a friend. Also consider doing a mindfulness exercise or try a daily deep breathing or imagery practice. Deep Breathing Slow, calm and deep breathing can help you relax. Try these steps to focus on your breathing and repeat as needed. Find a comfortable position and close your eyes. Exhale and drop your shoulders. Breathe in through your nose; fill your lungs and then your belly. Think of relaxing your body, quieting your mind and becoming calm and peaceful. Breathe out slowly through your nose, relaxing your belly. Think of releasing tension, pain, worries or distress. Repeat steps three and four until you feel relaxed. Imagery This involves using your mind to excite the senses -- sound, vision, smell, taste and feeling. This may help ease your stress. Begin by getting comfortable and then do some slow breathing. Imagine a place you love being at. It could be somewhere from your childhood, somewhere you vacationed or just a place in your imagination. Feel how it is to be in  the place you're imagining. Pay attention to the sounds, air, colors, and who is there with you. This is a place where you feel cared for and loved. All is well. You are safe. Take in all the smells, sounds, tastes and feelings. As you do, feel your body being nourished and healed. Feel the calm that surrounds you. Breathe in all the good. Breathe out any discomfort or tension. 7. Sleep Enough If you get less than seven to eight hours of sleep, your body won't tolerate stress as well as it could. If stress keeps you up at night, address the  cause, and add extra meditation into your day to make up for the lost z's. Try to get seven to nine hours of sleep each night. Make a regular bedtime schedule. Keep your room dark and cool. Try to avoid computers, TV, cell phones and tablets before bed. 8. Bond with Connections You Enjoy Go out for a coffee with a friend, chat with a neighbor, call a family member, visit with a clergy member, or even hang out with your pet. Clinical studies show that spending even a short time with a companion animal can cut anxiety levels almost in half. 9. Take a Vacation Getting away from it all can reset your stress tolerance by increasing your mental and emotional outlook, which makes you a happier, more productive person upon return. Leave your cellphone and laptop at home! 10. See a Counselor, Coach or Therapist If negative thoughts overwhelm your ability to make positive changes, it's time to seek professional help. Make an appointment today--your health and life are worth it."  If you are experiencing a Mental Health or [place] or need someone to talk to, please call the Suicide and Crisis Lifeline: 988    Patient Goals: Follow up goal

## 2022-09-14 NOTE — Patient Outreach (Signed)
Medicaid Managed Care Social Work Note  09/14/2022 Name:  Hannah Fleming MRN:  [id number] DOB:  04-07-1981  Hannah Fleming is an 42 y.o. year old female who is a primary patient of [name], MD.  The Medicaid Managed Care Coordination team was consulted for assistance with:  [place] and Resources  Ms. Dike was given information about Medicaid Managed Care Coordination team services today. Hannah Fleming Patient agreed to services and verbal consent obtained.  Engaged with patient  for by telephone forfollow up visit in response to referral for case management and/or care coordination services.   Assessments/Interventions:  Review of past medical history, allergies, medications, health status, including review of consultants reports, laboratory and other test data, was performed as part of comprehensive evaluation and provision of chronic care management services.  SDOH: (Social Determinant of Health) assessments and interventions performed: SDOH Interventions    Flowsheet Row Patient Outreach Telephone from 09/14/2022 in [place] Patient Outreach Telephone from 09/08/2022 in [place] Patient Outreach Telephone from 08/31/2022 in [place] Patient Outreach Telephone from 08/14/2022 in [place] Patient Outreach Telephone from 08/07/2022 in [place] Patient Outreach Telephone from 08/04/2022 in [place] Interventions        Food Insecurity Interventions -- -- -- -- -- Intervention Not Indicated  Utilities Interventions -- Intervention Not Indicated -- -- -- --  Alcohol Usage Interventions -- Intervention Not Indicated (Score <7) -- -- -- --  Depression Interventions/Treatment  -- -- -- -- Counseling --  Stress Interventions Provide Counseling  [Next Counseling appt on  09/29/22] -- Provide Counseling, Offered [employer] Resources Offered [employer], Provide Counseling [employer], Provide Counseling --       Advanced Directives Status:  See Care Plan for related entries.  Care Plan                 Allergies  Allergen Reactions   Prilosec Otc [Omeprazole Magnesium] Hives   Latex Itching and Rash    Medications Reviewed Today     Reviewed by Su Monks (Physician Assistant Certified) on 50/93/26 at [address] List Status: <None>   Medication Order Taking? Sig Documenting Provider Last Dose Status Informant  acetaminophen (TYLENOL) 500 MG tablet [id number] No Take 1,000 mg by mouth every 6 (six) hours as needed for mild pain or moderate pain.  Patient not taking: Reported on 08/03/2022   [provider] Not Taking Active Self  ibuprofen (ADVIL) 600 MG tablet [id number] Yes Take 1 tablet (600 mg total) by mouth every 6 (six) hours as needed. Constant, Peggy, MD Taking Active   metroNIDAZOLE (FLAGYL) 500 MG tablet [id number] No Take 1 tablet (500 mg total) by mouth 2 (two) times daily.  Patient not taking: Reported on 08/04/2022   Constant, Peggy, MD Not Taking Active   metroNIDAZOLE 1.3 % GEL [id number] No Place 1 Applicatorful vaginally 2 (two) times daily.  Patient not taking: Reported on 09/08/2022   [name], MD Not Taking Active   mirtazapine (REMERON) 15 MG tablet [id number] Yes Take 1 tablet (15 mg total) by mouth at bedtime. [name], MD Taking Active   Norethindrone Acetate-Ethinyl Estrad-FE (LOESTRIN 24 FE) 1-20 MG-MCG(24) tablet [id number] Yes Take 1 tablet by mouth daily. Constant, Peggy, MD Taking Active   oxyCODONE-acetaminophen (PERCOCET/ROXICET) 5-325 MG tablet [id number] No Take 1 tablet by  mouth every 6 (six) hours as needed.  Patient not taking: Reported on 08/03/2022   Constant, Peggy, MD Not Taking Active   Prenat-Fe Carbonyl-FA-Omega 3 (ONE-A-DAY WOMENS  PRENATAL 1) 28-0.8-235 MG CAPS [id number] No Take 1 capsule by mouth daily.  Patient not taking: Reported on 08/03/2022   [provider] Not Taking Active Self  promethazine (PHENERGAN) 25 MG tablet [id number] No Take 1 tablet (25 mg total) by mouth every 6 (six) hours as needed for nausea or vomiting.  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   Bacterial vaginitis 11/27/2020   Candida vaginitis 11/27/2020   Prediabetes 06/21/2019   Ear itch 03/30/2019   Tonsillar calculus 03/30/2019   Menorrhagia with regular cycle 12/28/2013   Anxiety 02/07/2013   Dysmenorrhea 02/07/2013    Conditions to be addressed/monitored per PCP order:  Depression  Care Plan : LCSW Plan of Care  Updates made by Greg Cutter, LCSW since 09/14/2022 12:00 AM     Problem: Depression Identification (Depression)      Long-Range Goal: Depressive Symptoms Identified   Start Date: 08/07/2022  Note:   Priority: High  Timeframe:  Long-Range Goal Priority:  High Start Date:   08/07/22             Expected End Date:  ongoing                     Follow Up Date--Goal completed on 09/14/22  - keep 90 percent of scheduled appointments -consider counseling or psychiatry -consider bumping up your self-care  -consider creating a stronger support network   Why is this important?             Combatting depression may take some time.            If you don't feel better right away, don't give up on your treatment plan.    Current barriers:   Chronic Mental Health needs related to depression, stress and anxiety. Patient requires Support, Education, Resources, Referrals, Advocacy, and Care Coordination, in order to meet Unmet Mental Health  Needs. Patient will implement clinical interventions discussed today to decrease symptoms of depression and increase knowledge and/or ability of: coping skills. Mental Health Concerns and Social Isolation Patient lacks knowledge of available community counseling agencies and resources.  Clinical Goal(s): verbalize understanding of plan for management of Anxiety, Depression, and Stress and demonstrate a reduction in symptoms. Patient will connect with a provider for ongoing mental health treatment, increase coping skills, healthy habits, self-management skills, and stress reduction        Clinical Interventions:  Assessed patient's previous and current treatment, coping skills, support system and barriers to care. Patient provided hx Patient reports that she has had a lot of issues socially and personally and that has affected her mood. Patient is only interested in counseling at this time as PCP has prescribed her a new medication to help her mood. Verbalization of feelings encouraged, motivational interviewing employed Emotional support provided, positive coping strategies explored. Establishing healthy boundaries emphasized and healthy self-care education provided Patient was educated on available mental health resources within their area that accept Medicaid and offer counseling and psychiatry. Email sent  to patient today with resource request- [place]  [place], [place] [address] [place] [employer],  [place], [place];  [employer] ; [place], [place] , [place] as well as instructions for scheduling at [facility] as well as some crisis support resources and [facility]'s walk in clinic hours Patient is agreeable to referral to [facility] for counseling ONLY. [facility] LCSW made referral on 08/07/22. LCSW provided education on relaxation techniques such as meditation, deep breathing, massage, grounding exercises or yoga that can activate the body's  relaxation response and ease symptoms of stress and anxiety. LCSW ask that when pt is struggling with difficult emotions and racing thoughts that they start this relaxation response process. LCSW provided extensive education on healthy coping skills for anxiety. SW used active and reflective listening, validated patient's feelings/concerns, and provided emotional support. Patient will work on implementing appropriate self-care habits into their daily routine such as: staying positive, writing a gratitude list, drinking water, staying active around the house, taking their medications as prescribed, combating negative thoughts or emotions and staying connected with their family and friends. Positive reinforcement provided for this decision to work on this. Motivational Interviewing employed Depression screen reviewed  PHQ2/ PHQ9 completed or reviewed  Mindfulness or Relaxation training provided Active listening / Reflection utilized  Advance Care and HCPOA education provided Emotional Support Provided Problem [place] strategies reviewed Provided psychoeducation for mental health needs  Provided brief CBT  Reviewed mental health medications and discussed importance of compliance:  Quality of sleep assessed & Sleep Hygiene techniques promoted  Participation in counseling encouraged  Verbalization of feelings encouraged  Suicidal Ideation/Homicidal Ideation assessed: Patient denies SI/HI  Review resources, discussed options and provided patient information about  [place] care team collaboration (see longitudinal plan of care) 08/15/23 update-  patient reports that her mood has been relatively more stable lately. She was unable to confirm if she received email that [facility] LCSW sent so [facility] LCSW sent additional email today and had patient write down contact information for [facility] in order for her to contact them to schedule her initial counseling session. Brief  self-care education provided as well. [facility] LCSW will follow up after the new year. Update- Patient reports that she was able to start counseling with a LCSW at her PCP's office so she no longer is in need of the counseling referral [facility] LCSW made for her. Patient is wanting more time to consider a psychiatry referral. She reports that she is having issues with her current medication that her PCP prescribed because it makes her very sleepy. [facility] LCSW will follow up in a few weeks to see if she has come to a decision about implementing psychiatry services. Email sent to patient with resources as well. BSW completed a telephone outreach with patient for eye resources. Patient stated she would also like dental resources in [facility]. Patient would like for theses resources to be emailed to [email or web address]. Resources have been sent. 09/14/22 update- Patient reports that her next counseling appt is on 09/29/22. Patient reports not wanting to implement psychiatry services into her support network. Patient denies any further needs at this time and will continue to practice healthy self-care. [facility] LCSW will close social work case at this time. Patient Goals/Self-Care Activities: Over the next 120 days Attend scheduled medical appointments Utilize healthy coping skills and supportive resources discussed Contact PCP with any questions or concerns Keep 90 percent of counseling appointments Call your insurance provider  for more information about your Enhanced Benefits  Check out counseling resources provided  Begin personal counseling with LCSW, to reduce and manage symptoms of Depression and Stress, until well-established with mental health provider Accept all calls from representative with [facility] in an effort to establish ongoing mental health counseling and supportive services. Incorporate into daily practice - relaxation techniques, deep breathing exercises, and mindfulness meditation strategies. Talk  about feelings with friends, family members, spiritual advisor, etc. Contact LCSW directly [phone]), if you have questions, need assistance, or if additional social work needs are identified between now and our next scheduled telephone outreach call. Call 988 for mental health hotline/crisis line if needed (24/7 available) Try techniques to reduce symptoms of anxiety/negative thinking (deep breathing, distraction, positive self talk, etc)  - develop a personal safety plan - develop a plan to deal with triggers like holidays, anniversaries - exercise at least 2 to 3 times per week - have a plan for how to handle bad days - journal feelings and what helps to feel better or worse - spend time or talk with others at least 2 to 3 times per week - watch for early signs of feeling worse - begin personal counseling - call and visit an old friend - check out volunteer opportunities - join a support group - laugh; watch a funny movie or comedian - learn and use visualization or guided imagery - perform a random act of kindness - practice relaxation or meditation daily - start or continue a personal journal - practice positive thinking and self-talk -continue with compliance of taking medication  -identify current effective and ineffective coping strategies.  -implement positive self-talk in care to increase self-esteem, confidence and feelings of control.  -consider alternative and complementary therapy approaches such as meditation, mindfulness or yoga.  -journaling, prayer, worship services, meditation or pastoral counseling.  -increase participation in pleasurable group activities such as hobbies, singing, sports or volunteering).  -consider the use of meditative movement therapy such as tai chi, yoga or qigong.  -start a regular daily exercise program based on tolerance, ability and patient choice to support positive thinking and activity    10 LITTLE Things To Do When You're Feeling  Too Down To Do Anything  Take a shower. Even if you plan to stay in all day long and not see a soul, take a shower. It takes the most effort to hop in to the shower but once you do, you'll feel immediate results. It will wake you up and you'll be feeling much fresher (and cleaner too).  Brush and floss your teeth. Give your teeth a good brushing with a floss finish. It's a small task but it feels so good and you can check 'taking care of your health' off the list of things to do.  Do something small on your list. Most of [place] have some small thing we would like to get done (load of laundry, sew a button, email a friend). Doing one of these things will make you feel like you've accomplished something.  Drink water. Drinking water is easy right? It's also really beneficial for your health so keep a glass beside you all day and take sips often. It gives you energy and prevents you from boredom eating.  Do some floor exercises. The last thing you want to do is exercise but it might be just the thing you need the most. Keep it simple and do exercises that involve sitting or laying on the floor. Even the smallest of  exercises release chemicals in the brain that make you feel good. Yoga stretches or core exercises are going to make you feel good with minimal effort.  Make your bed. Making your bed takes a few minutes but it's productive and you'll feel relieved when it's done. An unmade bed is a huge visual reminder that you're having an unproductive day. Do it and consider it your housework for the day.  Put on some nice clothes. Take the sweatpants off even if you don't plan to go anywhere. Put on clothes that make you feel good. Take a look in the mirror so your brain recognizes the sweatpants have been replaced with clothes that make you look great. It's an instant confidence booster.  Wash the dishes. A pile of dirty dishes in the sink is a reflection of your mood. It's possible that if you wash  up the dishes, your mood will follow suit. It's worth a try.  Cook a real meal. If you have the luxury to have a "do nothing" day, you have time to make a real meal for yourself. Make a meal that you love to eat. The process is good to get you out of the funk and the food will ensure you have more energy for tomorrow.  Write out your thoughts by hand. When you hand write, you stimulate your brain to focus on the moment that you're in so make yourself comfortable and write whatever comes into your mind. Put those thoughts out on paper so they stop spinning around in your head. Those thoughts might be the very thing holding you down.  The following coping skill education was provided for stress relief and mental health management: "When your car dies or a deadline looms, how do you respond? Long-term, low-grade or acute stress takes a serious toll on your body and mind, so don't ignore feelings of constant tension. Stress is a natural part of life. However, too much stress can harm our health, especially if it continues every day. This is chronic stress and can put you at risk for heart problems like heart disease and depression. Understand what's happening inside your body and learn simple coping skills to combat the negative impacts of everyday stressors.  Types of Stress There are two types of stress: Emotional - types of emotional stress are relationship problems, pressure at work, financial worries, experiencing discrimination or having a major life change. Physical - Examples of physical stress include being sick having pain, not sleeping well, recovery from an injury or having an alcohol and drug use disorder. Fight or Flight Sudden or ongoing stress activates your nervous system and floods your bloodstream with adrenaline and cortisol, two hormones that raise blood pressure, increase heart rate and spike blood sugar. These changes pitch your body into a fight or flight response. That enabled  our ancestors to outrun saber-toothed tigers, and it's helpful today for situations like dodging a car accident. But most modern chronic stressors, such as finances or a challenging relationship, keep your body in that heightened state, which hurts your health. Effects of Too Much Stress If constantly under stress, most of [place] will eventually start to function less well.  Multiple studies link chronic stress to a higher risk of heart disease, stroke, depression, weight gain, memory loss and even premature death, so it's important to recognize the warning signals. Talk to your doctor about ways to manage stress if you're experiencing any of these symptoms: Prolonged periods of poor sleep. Regular, severe headaches. Unexplained  weight loss or gain. Feelings of isolation, withdrawal or worthlessness. Constant anger and irritability. Loss of interest in activities. Constant worrying or obsessive thinking. Excessive alcohol or drug use. Inability to concentrate.  10 Ways to Cope with Chronic Stress It's key to recognize stressful situations as they occur because it allows you to focus on managing how you react. We all need to know when to close our eyes and take a deep breath when we feel tension rising. Use these tips to prevent or reduce chronic stress. 1. Rebalance Work and Home All work and no play? If you're spending too much time at the office, intentionally put more dates in your calendar to enjoy time for fun, either alone or with others. 2. Get Regular Exercise Moving your body on a regular basis balances the nervous system and increases blood circulation, helping to flush out stress hormones. Even a daily 20-minute walk makes a difference. Any kind of exercise can lower stress and improve your mood ? just pick activities that you enjoy and make it a regular habit. 3. Eat Well and Limit Alcohol and Stimulants Alcohol, nicotine and caffeine may temporarily relieve stress but have negative  health impacts and can make stress worse in the long run. Well-nourished bodies cope better, so start with a good breakfast, add more organic fruits and vegetables for a well-balanced diet, avoid processed foods and sugar, try herbal tea and drink more water. 4. Connect with Supportive People Talking face to face with another person releases hormones that reduce stress. Lean on those good listeners in your life. 5. [place] Time Do you enjoy gardening, reading, listening to music or some other creative pursuit? Engage in activities that bring you pleasure and joy; research shows that reduces stress by almost half and lowers your heart rate, too. 6. Practice Meditation, Stress Reduction or Yoga Relaxation techniques activate a state of restfulness that counterbalances your body's fight-or-flight hormones. Even if this also means a 10-minute break in a long day: listen to music, read, go for a walk in nature, do a hobby, take a bath or spend time with a friend. Also consider doing a mindfulness exercise or try a daily deep breathing or imagery practice. Deep Breathing Slow, calm and deep breathing can help you relax. Try these steps to focus on your breathing and repeat as needed. Find a comfortable position and close your eyes. Exhale and drop your shoulders. Breathe in through your nose; fill your lungs and then your belly. Think of relaxing your body, quieting your mind and becoming calm and peaceful. Breathe out slowly through your nose, relaxing your belly. Think of releasing tension, pain, worries or distress. Repeat steps three and four until you feel relaxed. Imagery This involves using your mind to excite the senses -- sound, vision, smell, taste and feeling. This may help ease your stress. Begin by getting comfortable and then do some slow breathing. Imagine a place you love being at. It could be somewhere from your childhood, somewhere you vacationed or just a place in your  imagination. Feel how it is to be in the place you're imagining. Pay attention to the sounds, air, colors, and who is there with you. This is a place where you feel cared for and loved. All is well. You are safe. Take in all the smells, sounds, tastes and feelings. As you do, feel your body being nourished and healed. Feel the calm that surrounds you. Breathe in all the good. Breathe out any discomfort  or tension. 7. Sleep Enough If you get less than seven to eight hours of sleep, your body won't tolerate stress as well as it could. If stress keeps you up at night, address the cause, and add extra meditation into your day to make up for the lost z's. Try to get seven to nine hours of sleep each night. Make a regular bedtime schedule. Keep your room dark and cool. Try to avoid computers, TV, cell phones and tablets before bed. 8. Bond with Connections You Enjoy Go out for a coffee with a friend, chat with a neighbor, call a family member, visit with a clergy member, or even hang out with your pet. Clinical studies show that spending even a short time with a companion animal can cut anxiety levels almost in half. 9. Take a Vacation Getting away from it all can reset your stress tolerance by increasing your mental and emotional outlook, which makes you a happier, more productive person upon return. Leave your cellphone and laptop at home! 10. See a Counselor, Coach or Therapist If negative thoughts overwhelm your ability to make positive changes, it's time to seek professional help. Make an appointment today--your health and life are worth it."  If you are experiencing a Mental Health or [place] or need someone to talk to, please call the Suicide and Crisis Lifeline: 988    Patient Goals: Follow up goal     Follow up:  Patient requests no follow-up at this time.  Plan: The Managed Medicaid care management team is available to follow up with the patient after provider conversation  with the patient regarding recommendation for care management engagement and subsequent re-referral to the care management team.   Eula Fried, BSW, MSW, LCSW Managed Medicaid LCSW [email or web address] Phone: [phone]

## 2022-09-29 ENCOUNTER — Encounter: Payer: Medicaid Other | Admitting: Licensed Clinical Social Worker

## 2022-10-03 ENCOUNTER — Other Ambulatory Visit: Payer: Self-pay | Admitting: Family Medicine

## 2022-10-12 ENCOUNTER — Other Ambulatory Visit: Payer: Medicaid Other | Admitting: Obstetrics and Gynecology

## 2022-10-12 ENCOUNTER — Other Ambulatory Visit: Payer: Medicaid Other

## 2022-10-12 ENCOUNTER — Encounter: Payer: Self-pay | Admitting: Obstetrics and Gynecology

## 2022-10-12 NOTE — Patient Instructions (Signed)
Visit Information  [name] was given information about Medicaid Managed Care team care coordination services as a part of their [place] Medicaid benefit. Hannah Fleming verbally consented to engagement with the [facility] Managed Care team.   If you are experiencing a medical emergency, please call 911 or report to your local emergency department or urgent care.   If you have a non-emergency medical problem during routine business hours, please contact your provider's office and ask to speak with a nurse.   For questions related to your [facility], please call: [phone] or visit the homepage here: [email or web address]  If you would like to schedule transportation through your [facility], please call the following number at least 2 days in advance of your appointment: [phone]   Rides for urgent appointments can also be made after hours by calling Member Services.  Call the [place] at [phone], at any time, 24 hours a day, 7 days a week. If you are in danger or need immediate medical attention call 911.  If you would like help to quit smoking, call 1-800-QUIT-NOW (931) 288-2401) OR Espaol: 1-855-Djelo-Ya HD:1601594) o para ms informacin haga clic aqu or Text READY to 200-400 to register via text  Hannah Fleming - following are the goals we discussed in your visit today:   Goals Addressed   None      The  Patient                                              has been provided with contact information for the Managed Medicaid care management team and has been advised to call with any health related questions or concerns.   Mickel Fuchs, BSW, [place] Managed Medicaid Team  [phone]   Following is a copy of your plan of care:  There are no care plans that you  recently modified to display for this patient.

## 2022-10-12 NOTE — Patient Outreach (Signed)
Medicaid Managed Care Social Work Note  10/12/2022 Name:  Hannah Fleming MRN:  [id number] DOB:  [birth year]  Hannah Fleming is an 42 y.o. year old female who is a primary patient of Hannah Mai, MD.  The [facility] Managed Care Coordination team was consulted for assistance with:   eye and dental resources  Hannah Fleming was given information about Medicaid Managed Care Coordination team services today. Hannah Fleming Patient agreed to services and verbal consent obtained.  Engaged with patient  for by telephone forfollow up visit in response to referral for case management and/or care coordination services.   Assessments/Interventions:  Review of past medical history, allergies, medications, health status, including review of consultants reports, laboratory and other test data, was performed as part of comprehensive evaluation and provision of chronic care management services.  SDOH: (Social Determinant of Health) assessments and interventions performed: SDOH Interventions    Flowsheet Row Patient Outreach Telephone from 09/14/2022 in [place] Patient Outreach Telephone from 09/08/2022 in [place] Patient Outreach Telephone from 08/31/2022 in [place] Patient Outreach Telephone from 08/14/2022 in [place] Patient Outreach Telephone from 08/07/2022 in [place] Patient Outreach Telephone from 08/04/2022 in [place] Interventions        Food Insecurity Interventions -- -- -- -- -- Intervention Not Indicated  Utilities Interventions -- Intervention Not Indicated -- -- -- --  Alcohol Usage Interventions -- Intervention Not Indicated (Score <7) -- -- -- --  Depression Interventions/Treatment  -- -- -- -- Counseling --  Stress Interventions Provide Counseling  [Next Counseling appt on 09/29/22] -- Provide  Counseling, [place] [facility], Provide Counseling Offered [employer], Provide Counseling --      BSW completed a telephone outreach with patient, she stated she did receive the resources BSW sent her, but she has not had a chance to make the appointment because of work. Patient states no other resources are needed at this time. Advanced Directives Status:  Not addressed in this encounter.  Care Plan                 Allergies  Allergen Reactions   Prilosec Otc [Omeprazole Magnesium] Hives   Latex Itching and Rash    Medications Reviewed Today     Reviewed by Su Monks (Physician Assistant Certified) on 0000000 at [place] List Status: <None>   Medication Order Taking? Sig Documenting Provider Last Dose Status Informant  acetaminophen (TYLENOL) 500 MG tablet [id number] No Take 1,000 mg by mouth every 6 (six) hours as needed for mild pain or moderate pain.  Patient not taking: Reported on 08/03/2022   [provider] Not Taking Active Self  ibuprofen (ADVIL) 600 MG tablet [id number] Yes Take 1 tablet (600 mg total) by mouth every 6 (six) hours as needed. Constant, Peggy, MD Taking Active   metroNIDAZOLE (FLAGYL) 500 MG tablet [id number] No Take 1 tablet (500 mg total) by mouth 2 (two) times daily.  Patient not taking: Reported on 08/04/2022   Constant, Peggy, MD Not Taking Active   metroNIDAZOLE 1.3 % GEL 0000000 No Place 1 Applicatorful vaginally 2 (two) times daily.  Patient not taking: Reported on 09/08/2022   Hannah Mai, MD Not Taking Active   mirtazapine (REMERON) 15 MG tablet [id number] Yes Take 1 tablet (15 mg total) by mouth at bedtime. Hannah Mai, MD  Taking Active   Norethindrone Acetate-Ethinyl Estrad-FE (LOESTRIN 24 FE) 1-20 MG-MCG(24) tablet [id number] Yes Take 1 tablet by mouth daily. Constant, Peggy, MD Taking Active   oxyCODONE-acetaminophen (PERCOCET/ROXICET) 5-325 MG  tablet [id number] No Take 1 tablet by mouth every 6 (six) hours as needed.  Patient not taking: Reported on 08/03/2022   Constant, Peggy, MD Not Taking Active   Prenat-Fe Carbonyl-FA-Omega 3 (ONE-A-DAY WOMENS PRENATAL 1) 28-0.8-235 MG CAPS [id number] No Take 1 capsule by mouth daily.  Patient not taking: Reported on 08/03/2022   [provider] Not Taking Active Self  promethazine (PHENERGAN) 25 MG tablet [id number] No Take 1 tablet (25 mg total) by mouth every 6 (six) hours as needed for nausea or vomiting.  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   Follow up:  Patient agrees to Care Plan and Follow-up.  Plan: The  Patient has been provided with contact information for the Managed Medicaid care management team and has been advised to call with any health related questions or concerns.  Mickel Fuchs, BSW, [place] Managed Medicaid Team  [phone]

## 2022-10-12 NOTE — Patient Instructions (Signed)
Hi Ms. Marx, nice speaking with you-have a good afternoon!  Ms. Saltos was given information about Medicaid Managed Care team care coordination services as a part of their [place] Medicaid benefit. [name] verbally consented to engagement with the [facility] Managed Care team.   If you are experiencing a medical emergency, please call 911 or report to your local emergency department or urgent care.   If you have a non-emergency medical problem during routine business hours, please contact your provider's office and ask to speak with a nurse.   For questions related to your [facility], please call: [phone] or visit the homepage here: [email or web address]  If you would like to schedule transportation through your [facility], please call the following number at least 2 days in advance of your appointment: [phone]   Rides for urgent appointments can also be made after hours by calling Member Services.  Call the [place] at [phone], at any time, 24 hours a day, 7 days a week. If you are in danger or need immediate medical attention call 911.  If you would like help to quit smoking, call 1-800-QUIT-NOW 469-270-5499) OR Espaol: 1-855-Djelo-Ya QO:409462) o para ms informacin haga clic aqu or Text READY to 200-400 to register via text  Ms. Avilla - following are the goals we discussed in your visit today:   Goals Addressed             This Visit's Progress    Cope with Chronic Pain       Timeframe:  Long-Range Goal Priority:  Medium Start Date:     04/15/21                        Expected End Date:    ongoing                   Follow Up Date 11/10/22   - learn relaxation techniques - practice acceptance of chronic pain - practice relaxation or meditation daily - spend time with positive people - think  of new ways to do favorite things - use distraction techniques - use relaxation during pain  - review written information mailed to me on chronic pain   Why is this important?   Stress makes chronic pain feel worse.  Feelings like depression, anxiety, stress and anger can make your body more sensitive to pain.  Learning ways to cope with stress or depression may help you find some relief from the pain.     10/12/22:  Patient being followed by Rheumatology. Has PCP appt 3/4    Find Help in My Community       Timeframe:  Short-Term Goal Priority:  High Start Date:       04/15/21                      Expected End Date:    ongoing                   Follow Up Date 11/10/22   - follow-up on any referrals for help I am given    Why is this important?   Knowing how and where to find help for yourself or family in your neighborhood and community is an important skill.  You will want to take some steps to learn how.    10/12/22:  Patient has received eye and dental resources  Learn More About My Health       Timeframe:  Long-Range Goal Priority:  High Start Date:   04/15/21                          Expected End Date:     ongoing                  Follow Up Date 11/10/22   - make a list of questions - ask questions - review educational material mailed to me  on prediabetes and chronic pain - call my health plan customer service number to secure replacement card and to find out about benefits related to weight management, phone program and earning money via completing wellness activities  - review information mailed to me about health plan benefits   Why is this important?   The best way to learn about your health and care is by talking to the doctor and nurse.  They will answer your questions and give you information in the way that you like best.    10/12/22:  patient following up with insurance   Patient verbalizes understanding of instructions and care plan provided today and agrees  to view in [place]. Active MyChart status and patient understanding of how to access instructions and care plan via MyChart confirmed with patient.     The Managed Medicaid care management team will reach out to the patient again over the next 30 business  days.  The  Patient  has been provided with contact information for the Managed Medicaid care management team and has been advised to call with any health related questions or concerns.   Aida Raider RN, BSN [place] Management Coordinator - Managed Medicaid High Risk [phone]   Following is a copy of your plan of care:  Care Plan : [place]  Updates made by [name], RN since 10/12/2022 12:00 AM     Problem: Chronic Disease Management and Care Coordination Needs      Long-Range Goal: Development of Plan Of Care For Chronic Disease Management and Care Coordination Needs To Assist With Meeting Treatment Goals   Start Date: 04/15/2021  Expected End Date: 01/10/2023  Priority: High  Note:   Current Barriers:  Care Coordination needs related eye and dental resources Chronic Disease Management support and education needs related to weight concerns, depression/anxiety 10/12/22:  Patient following up with PCP regarding a prescription for Cymbalta.  Received eye and dental resources.  Has SW and PCP follow up in March.  Rheumatology following RNCM Clinical Goal(s): Patient will verbalize basic understanding of weight management and depression disease process and self health management plan .  take all medications exactly as prescribed and will call provider for medication related questions attend all scheduled medical appointments: with primary care and specialist demonstrate ongoing adherence to prescribed treatment plan for  weight management and depression as evidenced by exercise 5-7 days/week adherence to prescribed medication regimen contacting provider for new or worsened  symptoms or questions . continue to work with RN Care Manager to address care management and care coordination needs related to weight management and depression work with Education officer, museum to address depression through collaboration with Consulting civil engineer, provider, and care tem.  Interventions Inter-disciplinary  care team collaboration (see longitudinal plan of care) Evaluation of current treatment plan related to  self management and patient's adherence to plan as established by  provider Collaborated with LCSW LCSW referral for depression-completed Collaborated with BSW BSW referral for eye and dental providers at patient request-completed  Weight Loss:  (Status: Goal on track: NO.) Provided patient with contact information for managed Medicaid health plan customer service phone number about weight management benefits  05/13/21- Reviewed weight management benefits offered by patient's health plan and again encouraged her to call customer service for details 08/04/22:  Discussed nutrition referral and/or Healthy Weight and Wellness-patient to f/u with provider 09/08/22:  patient considering nutrition referral.  PreDiabetes:  (Status: Goal on track: NO.) Lab Results  Component Value Date   HGBA1C 5.7 (H) 12/06/2019  Assessed patient's understanding of A1c goal:  <5.7% Provided education to patient about basic prediabetes disease process; Provided written information on prediabetes eating plan Counseled on importance of regular laboratory monitoring as prescribed;        Discussed plans with patient for ongoing care management follow up and provided patient with direct contact information for care management team;      09/08/22:  patient considering nutrition referral  Pain:  (Status: New goal. Goal on track: YES.) Pain assessment performed Medications reviewed Reviewed provider established plan for pain management; Discussed importance of adherence to all scheduled medical  appointments; Counseled on the importance of reporting any/all new or changed pain symptoms or management strategies to pain management provider; Reviewed with patient prescribed pharmacological and nonpharmacological pain relief strategies; Provided written health plan information related to alternative healing benefits such as  massage and acupuncture 05/13/21- Encouraged patient to review chronic pain education mailed to her last month 08/04/22:  No complaints of pain  Patient Goals/Self-Care Activities: Patient will self administer medications as prescribed  Patient will attend all scheduled provider appointments  Patient will review educational material mailed to her on 04/15/21 Patient will continue to perform ADL's independently Patient will continue to perform IADL's independently Patient will call provider office for new concerns or questions Patient will follow up with provider regarding medication and weight management

## 2022-10-12 NOTE — Patient Outreach (Signed)
Medicaid Managed Care   Nurse Care Manager Note  10/12/2022 Name:  Hannah Fleming MRN:  [id number] DOB:  July 01, 1981  Hannah Fleming is an 42 y.o. year old female who is a primary patient of [name], MD.  The [facility] Managed Care Coordination team was consulted for assistance with:    Chronic healthcare management needs, anxiety, depression, DDD, osteoarthritis, chronic pain  Ms. Beckham was given information about Medicaid Managed Care Coordination team services today. [name] Patient agreed to services and verbal consent obtained.  Engaged with patient by telephone for follow up visit in response to provider referral for case management and/or care coordination services.   Assessments/Interventions:  Review of past medical history, allergies, medications, health status, including review of consultants reports, laboratory and other test data, was performed as part of comprehensive evaluation and provision of chronic care management services.  SDOH (Social Determinants of Health) assessments and interventions performed: SDOH Interventions    Flowsheet Row Patient Outreach Telephone from 10/12/2022 in [place] Patient Outreach Telephone from 09/14/2022 in [place] Patient Outreach Telephone from 09/08/2022 in [place] Patient Outreach Telephone from 08/31/2022 in [place] Patient Outreach Telephone from 08/14/2022 in [place] Patient Outreach Telephone from 08/07/2022 in [place]  SDOH Interventions        Utilities Interventions -- -- Intervention Not Indicated -- -- --  Alcohol Usage Interventions -- -- Intervention Not Indicated (Score <7) -- -- --  Depression Interventions/Treatment  -- -- -- -- -- Counseling  Physical Activity Interventions Intervention Not Indicated -- -- -- -- --   Stress Interventions -- Provide Counseling  [Next Counseling appt on 09/29/22] -- Provide Counseling, [facility], Provide Counseling Offered [employer], Provide Counseling  Social Connections Interventions Intervention Not Indicated -- -- -- -- --     Care Plan  Allergies  Allergen Reactions   Prilosec Otc [Omeprazole Magnesium] Hives   Latex Itching and Rash    Medications Reviewed Today     Reviewed by [name], RN (Registered Nurse) on 10/12/22 at 54  Med List Status: <None>   Medication Order Taking? Sig Documenting Provider Last Dose Status Informant  acetaminophen (TYLENOL) 500 MG tablet [id number] No Take 1,000 mg by mouth every 6 (six) hours as needed for mild pain or moderate pain.  Patient not taking: Reported on 08/03/2022   [provider] Not Taking Active Self  ibuprofen (ADVIL) 600 MG tablet [id number] No Take 1 tablet (600 mg total) by mouth every 6 (six) hours as needed. Constant, Peggy, MD Taking Active   methocarbamol (ROBAXIN) 500 MG tablet [id number]  Take 1 tablet (500 mg total) by mouth daily as needed for muscle spasms. [name], PA-C  Active   metroNIDAZOLE (FLAGYL) 500 MG tablet [id number] No Take 1 tablet (500 mg total) by mouth 2 (two) times daily.  Patient not taking: Reported on 08/04/2022   Constant, Peggy, MD Not Taking Active   metroNIDAZOLE 1.3 % GEL 0000000 No Place 1 Applicatorful vaginally 2 (two) times daily.  Patient not taking: Reported on 09/08/2022   [name], MD Not Taking Active   mirtazapine (REMERON) 15 MG tablet [id number]  TAKE 1 TABLET BY MOUTH EVERYDAY AT BEDTIME [name], MD  Active   Norethindrone Acetate-Ethinyl Estrad-FE (LOESTRIN 24 FE) 1-20 MG-MCG(24) tablet [id number] No Take 1 tablet by  mouth daily. Constant, Peggy, MD Taking Active   oxyCODONE-acetaminophen (PERCOCET/ROXICET) 5-325 MG tablet [id number] No Take 1 tablet  by mouth every 6 (six) hours as needed.  Patient not taking: Reported on 08/03/2022   Constant, Peggy, MD Not Taking Active   Prenat-Fe Carbonyl-FA-Omega 3 (ONE-A-DAY WOMENS PRENATAL 1) 28-0.8-235 MG CAPS [id number] No Take 1 capsule by mouth daily.  Patient not taking: Reported on 08/03/2022   [provider] Not Taking Active Self  promethazine (PHENERGAN) 25 MG tablet [id number] No Take 1 tablet (25 mg total) by mouth every 6 (six) hours as needed for nausea or vomiting.  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   Bacterial vaginitis 11/27/2020   Candida vaginitis 11/27/2020   Prediabetes 06/21/2019   Ear itch 03/30/2019   Tonsillar calculus 03/30/2019   Menorrhagia with regular cycle 12/28/2013   Anxiety 02/07/2013   Dysmenorrhea 02/07/2013   Conditions to be addressed/monitored per PCP order:  Chronic healthcare management needs, anxiety, depression, DDD, osteoarthritis, chronic pain  Care Plan : RN Care Manager Plan Of Care  Updates made by [name], RN since 10/12/2022 12:00 AM     Problem: Chronic Disease Management and Care Coordination Needs      Long-Range Goal: Development of Plan Of Care For Chronic Disease Management and Care Coordination Needs To Assist With Meeting Treatment Goals   Start Date: 04/15/2021  Expected End Date: 01/10/2023  Priority: High  Note:   Current Barriers:  Care Coordination needs related eye and dental resources Chronic Disease Management support and education needs related to weight concerns, depression/anxiety 10/12/22:  Patient following up with PCP regarding a prescription for Cymbalta.  Received eye and dental resources.  Has SW and PCP follow up in March.   Rheumatology following RNCM Clinical Goal(s): Patient will verbalize basic understanding of weight management and depression disease process and self health management plan .  take all medications exactly as prescribed and will call provider for medication related questions attend all scheduled medical appointments: with primary care and specialist demonstrate ongoing adherence to prescribed treatment plan for  weight management and depression as evidenced by exercise 5-7 days/week adherence to prescribed medication regimen contacting provider for new or worsened symptoms or questions . continue to work with RN Care Manager to address care management and care coordination needs related to weight management and depression work with Education officer, museum to address depression through collaboration with Consulting civil engineer, provider, and care tem.  Interventions Inter-disciplinary  care team collaboration (see longitudinal plan of care) Evaluation of current treatment plan related to  self management and patient's adherence to plan as established by provider Collaborated with LCSW LCSW referral for depression-completed Collaborated with BSW BSW referral for eye and dental providers at patient request-completed  Weight Loss:  (Status: Goal on track: NO.) Provided patient with contact information for managed Medicaid health plan customer service phone number about weight management benefits  05/13/21- Reviewed weight management benefits offered by patient's health plan and again encouraged her to call customer service for details 08/04/22:  Discussed nutrition referral and/or Healthy Weight and Wellness-patient to f/u with provider 09/08/22:  patient considering nutrition referral.  PreDiabetes:  (Status: Goal on track:  NO.) Lab Results  Component Value Date   HGBA1C 5.7 (H) 12/06/2019  Assessed patient's understanding of A1c goal:  <5.7% Provided education to patient about basic prediabetes disease  process; Provided written information on prediabetes eating plan Counseled on importance of regular laboratory monitoring as prescribed;        Discussed plans with patient for ongoing care management follow up and provided patient with direct contact information for care management team;      09/08/22:  patient considering nutrition referral  Pain:  (Status: New goal. Goal on track: YES.) Pain assessment performed Medications reviewed Reviewed provider established plan for pain management; Discussed importance of adherence to all scheduled medical appointments; Counseled on the importance of reporting any/all new or changed pain symptoms or management strategies to pain management provider; Reviewed with patient prescribed pharmacological and nonpharmacological pain relief strategies; Provided written health plan information related to alternative healing benefits such as  massage and acupuncture 05/13/21- Encouraged patient to review chronic pain education mailed to her last month 08/04/22:  No complaints of pain  Patient Goals/Self-Care Activities: Patient will self administer medications as prescribed  Patient will attend all scheduled provider appointments  Patient will review educational material mailed to her on 04/15/21 Patient will continue to perform ADL's independently Patient will continue to perform IADL's independently Patient will call provider office for new concerns or questions Patient will follow up with provider regarding medication and weight management    Follow Up:  Patient agrees to Care Plan and Follow-up.  Plan: The Managed Medicaid care management team will reach out to the patient again over the next 30 business  days. and The  Patient has been provided with contact information for the Managed Medicaid care management team and has been advised to call with any health related questions or concerns.  Date/time of next scheduled RN care management/care coordination  outreach:  11/11/22 at 1030.

## 2022-10-22 ENCOUNTER — Encounter (HOSPITAL_COMMUNITY): Payer: Self-pay

## 2022-10-22 ENCOUNTER — Ambulatory Visit (INDEPENDENT_AMBULATORY_CARE_PROVIDER_SITE_OTHER): Payer: Medicaid Other | Admitting: Clinical

## 2022-10-22 DIAGNOSIS — F331 Major depressive disorder, recurrent, moderate: Secondary | ICD-10-CM | POA: Diagnosis not present
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
## 2022-10-26 ENCOUNTER — Encounter: Payer: Self-pay | Admitting: Family Medicine

## 2022-10-26 ENCOUNTER — Other Ambulatory Visit (HOSPITAL_COMMUNITY)
Admission: RE | Admit: 2022-10-26 | Discharge: 2022-10-26 | Disposition: A | Discharge status: Home / Self Care | Payer: Medicaid Other | Source: Ambulatory Visit | Attending: Family Medicine | Admitting: Family Medicine

## 2022-10-26 ENCOUNTER — Ambulatory Visit (INDEPENDENT_AMBULATORY_CARE_PROVIDER_SITE_OTHER): Payer: Medicaid Other | Admitting: Family Medicine

## 2022-10-26 VITALS — BP 136/91 | HR 89 | Temp 98.1°F | Resp 16 | Wt 213.8 lb

## 2022-10-26 DIAGNOSIS — Z6836 Body mass index (BMI) 36.0-36.9, adult: Secondary | ICD-10-CM

## 2022-10-26 DIAGNOSIS — N760 Acute vaginitis: Secondary | ICD-10-CM | POA: Insufficient documentation

## 2022-10-26 DIAGNOSIS — E6609 Other obesity due to excess calories: Secondary | ICD-10-CM

## 2022-10-26 DIAGNOSIS — F32A Depression, unspecified: Secondary | ICD-10-CM

## 2022-10-26 MED ORDER — DULOXETINE HCL 20 MG PO CPEP: 20.0000 mg | ORAL_CAPSULE | Freq: Every day | ORAL | 1 refills | Status: DC

## 2022-10-26 NOTE — Progress Notes (Unsigned)
New Patient Office Visit  Subjective    Patient ID: Hannah Fleming, female    DOB: [birth year]  Age: 42 y.o. MRN: [id number]  CC:  Chief Complaint  Patient presents with   Follow-up    HPI MIAA VANDIVIER presents to establish care ***  Outpatient Encounter Medications as of 10/26/2022  Medication Sig   ibuprofen (ADVIL) 600 MG tablet Take 1 tablet (600 mg total) by mouth every 6 (six) hours as needed.   methocarbamol (ROBAXIN) 500 MG tablet Take 1 tablet (500 mg total) by mouth daily as needed for muscle spasms.   mirtazapine (REMERON) 15 MG tablet TAKE 1 TABLET BY MOUTH EVERYDAY AT BEDTIME   mometasone (ELOCON) 0.1 % lotion    Norethindrone Acetate-Ethinyl Estrad-FE (LOESTRIN 24 FE) 1-20 MG-MCG(24) tablet Take 1 tablet by mouth daily.   acetaminophen (TYLENOL) 500 MG tablet Take 1,000 mg by mouth every 6 (six) hours as needed for mild pain or moderate pain. (Patient not taking: Reported on 08/03/2022)   metroNIDAZOLE (FLAGYL) 500 MG tablet Take 1 tablet (500 mg total) by mouth 2 (two) times daily. (Patient not taking: Reported on 08/04/2022)   metroNIDAZOLE 1.3 % GEL Place 1 Applicatorful vaginally 2 (two) times daily. (Patient not taking: Reported on 09/08/2022)   oxyCODONE-acetaminophen (PERCOCET/ROXICET) 5-325 MG tablet Take 1 tablet by mouth every 6 (six) hours as needed. (Patient not taking: Reported on 08/03/2022)   Prenat-Fe Carbonyl-FA-Omega 3 (ONE-A-DAY WOMENS PRENATAL 1) 28-0.8-235 MG CAPS Take 1 capsule by mouth daily. (Patient not taking: Reported on 08/03/2022)   promethazine (PHENERGAN) 25 MG tablet Take 1 tablet (25 mg total) by mouth every 6 (six) hours as needed for nausea or vomiting. (Patient not taking: Reported on 07/02/2022)   No facility-administered encounter medications on file as of 10/26/2022.    Past Medical History:  Diagnosis Date   Arthritis    RA   Fibroids    Genital herpes    no outbreak in 79yr   Trichomonas infection    Urticaria      Past Surgical History:  Procedure Laterality Date   DILATATION & CURETTAGE/HYSTEROSCOPY WITH MYOSURE N/A 10/22/2021   Procedure: DILATATION & CURETTAGE/HYSTEROSCOPY WITH MYOSURE;  Surgeon: BGriffin Basil MD;  [facility]  Service: Gynecology;  Laterality: N/A;   DILATION AND CURETTAGE OF UTERUS  2020   DILATION AND EVACUATION N/A 07/09/2022   Procedure: DILATATION AND EVACUATION;  Surgeon: CMora Bellman MD;  [place] [facility]  Service: Gynecology;  Laterality: N/A;   INDUCED ABORTION     WISDOM TOOTH EXTRACTION      Family History  Problem Relation Age of Onset   Healthy Mother    Sickle cell trait Mother    Healthy Father    Sickle cell trait Sister    Sickle cell trait Brother    Healthy Daughter    Healthy Son    Healthy Son    Other Neg Hx     Social History   Socioeconomic History   Marital status: Single    Spouse name: Not on file   Number of children: 3   Years of education: Not on file   Highest education level: Not on file  Occupational History   Not on file  Tobacco Use   Smoking status: Former    Packs/day: 1.00    Years: 4.00    Total pack years: 4.00    Types: Cigarettes    Quit date: 02/07/2009    Years since  quitting: 13.7    Passive exposure: Never   Smokeless tobacco: Never  Vaping Use   Vaping Use: Never used  Substance and Sexual Activity   Alcohol use: Yes    Comment: OCC   Drug use: No   Sexual activity: Yes    Partners: Male    Birth control/protection: None    Comment: approx [redacted] wks gestation per patient  Other Topics Concern   Not on file  Social History Narrative   Not on file   Social Determinants of Health   Financial Resource Strain: High Risk (04/15/2021)   Overall Financial Resource Strain (CARDIA)    Difficulty of Paying Living Expenses: Hard  Food Insecurity: No Food Insecurity (08/04/2022)   Hunger Vital Sign    Worried About Running Out of Food in the Last Year: Never true    Ran  Out of Food in the Last Year: Never true  Transportation Needs: No Transportation Needs (04/15/2021)   PRAPARE - Hydrologist (Medical): No    Lack of Transportation (Non-Medical): No  Physical Activity: Sufficiently Active (10/12/2022)   Exercise Vital Sign    Days of Exercise per Week: 7 days    Minutes of Exercise per Session: 30 min  Stress: Stress Concern Present (09/14/2022)   [place]    Feeling of Stress : To some extent  Social Connections: Socially Integrated (10/12/2022)   Social Connection and Isolation Panel [NHANES]    Frequency of Communication with Friends and Family: More than three times a week    Frequency of Social Gatherings with Friends and Family: More than three times a week    Attends Religious Services: 1 to 4 times per year    Active Member of [employer] or Organizations: Yes    Attends Archivist Meetings: 1 to 4 times per year    Marital Status: Living with partner  Intimate Partner Violence: Not At Risk (08/04/2022)   Humiliation, Afraid, Rape, and Kick questionnaire    Fear of Current or Ex-Partner: No    Emotionally Abused: No    Physically Abused: No    Sexually Abused: No    ROS      Objective    BP (!) 136/91   Pulse 89   Temp 98.1 F (36.7 C) (Oral)   Resp 16   Wt 213 lb 12.8 oz (97 kg)   LMP 03/24/2022 (Approximate)   BMI 36.70 kg/m   Physical Exam  {Labs (Optional):23779}    Assessment & Plan:   Problem List Items Addressed This Visit   None Visit Diagnoses     Depression, unspecified depression type    -  Primary       No follow-ups on file.   Becky Sax, MD

## 2022-10-27 ENCOUNTER — Encounter: Payer: Medicaid Other | Admitting: Licensed Clinical Social Worker

## 2022-10-27 LAB — CERVICOVAGINAL ANCILLARY ONLY
Bacterial Vaginitis (gardnerella): NEGATIVE
Candida Glabrata: NEGATIVE
Candida Vaginitis: NEGATIVE
Chlamydia: NEGATIVE
Comment: NEGATIVE
Comment: NEGATIVE
Comment: NEGATIVE
Comment: NEGATIVE
Comment: NEGATIVE
Comment: NORMAL
Neisseria Gonorrhea: NEGATIVE
Trichomonas: NEGATIVE

## 2022-10-28 ENCOUNTER — Encounter: Payer: Self-pay | Admitting: Family Medicine

## 2022-10-29 ENCOUNTER — Encounter: Payer: Medicaid Other | Admitting: Licensed Clinical Social Worker

## 2022-11-09 NOTE — BH Specialist Note (Signed)
Integrated Behavioral Health via Telemedicine Visit  11/09/2022 Hannah Fleming [id number]  Number of [place] Clinician visits: 1- Initial Visit  Session Start time: [id number]   Session End time: 1500  Total time in minutes: 55  Referring Provider: PCP Redmond Pulling Patient/Family location: home [facility] Provider location: [facility] All persons participating in visit: pt and therapist Types of Service: Individual psychotherapy and Video visit  I connected with [name] via  Telephone or Video Enabled Telemedicine Application  (Video is Caregility application) and verified that I am speaking with the correct person using two identifiers. Discussed confidentiality: Yes   I discussed the limitations of telemedicine and the availability of in person appointments.  Discussed there is a possibility of technology failure and discussed alternative modes of communication if that failure occurs.  I discussed that engaging in this telemedicine visit, they consent to the provision of behavioral healthcare and the services will be billed under their insurance.  Patient and/or legal guardian expressed understanding and consented to Telemedicine visit: Yes   Presenting Concerns: Patient and/or family reports the following symptoms/concerns: depression Duration of problem: for years; Severity of problem: moderate  Patient and/or Family's Strengths/Protective Factors: Concrete supports in place (healthy food, safe environments, etc.)  Goals Addressed: Patient will:  Reduce symptoms of: depression   Increase knowledge and/or ability of: coping skills   Demonstrate ability to: Increase healthy adjustment to current life circumstances  Progress towards Goals: Ongoing  Interventions: Interventions utilized:  Supportive Counseling Standardized Assessments completed: Not Needed  Patient and/or Family Response: Pt was very pleasant to speak with. Pt. discussed that she is not  feeling like herself, pt is very tearful and wanting find some balance in her life and manage her emotions.   Assessment: Patient currently experiencing signs of depression, little to no interest in things, often sadness, life stressors  Patient may benefit from seeking ongoing therapy, taking time to take care of herself rather its exercise and or journaling..  Plan: Follow up with behavioral health clinician on : 4 weeks Behavioral recommendations: seeking ongoing therapy, taking time to take care of herself rather its exercise and or journaling. Referral(s): [place] (In Clinic), Psychiatrist, and Counselor  I discussed the assessment and treatment plan with the patient and/or parent/guardian. They were provided an opportunity to ask questions and all were answered. They agreed with the plan and demonstrated an understanding of the instructions.   They were advised to call back or seek an in-person evaluation if the symptoms worsen or if the condition fails to improve as anticipated.  Shann Medal, LCSWA

## 2022-11-11 ENCOUNTER — Other Ambulatory Visit: Payer: Self-pay | Admitting: Family Medicine

## 2022-11-11 ENCOUNTER — Other Ambulatory Visit: Payer: Medicaid Other | Admitting: Obstetrics and Gynecology

## 2022-11-11 ENCOUNTER — Encounter: Payer: Self-pay | Admitting: Obstetrics and Gynecology

## 2022-11-11 DIAGNOSIS — E6609 Other obesity due to excess calories: Secondary | ICD-10-CM

## 2022-11-11 NOTE — Patient Instructions (Signed)
[name], nice to speak with you today-please follow up with your provider regarding your blood pressure-thank you!!  Hannah Fleming was given information about Medicaid Managed Care team care coordination services as a part of their [place] Medicaid benefit. [name] verbally consented to engagement with the [facility] Managed Care team.   If you are experiencing a medical emergency, please call 911 or report to your local emergency department or urgent care.   If you have a non-emergency medical problem during routine business hours, please contact your provider's office and ask to speak with a nurse.   For questions related to your [facility], please call: [phone] or visit the homepage here: [email or web address]  If you would like to schedule transportation through your [facility], please call the following number at least 2 days in advance of your appointment: [phone]   Rides for urgent appointments can also be made after hours by calling Member Services.  Call the [place] at [phone], at any time, 24 hours a day, 7 days a week. If you are in danger or need immediate medical attention call 911.  If you would like help to quit smoking, call 1-800-QUIT-NOW 614-087-2004) OR Espaol: 1-855-Djelo-Ya HD:1601594) o para ms informacin haga clic aqu or Text READY to 200-400 to register via text  Hannah Fleming - following are the goals we discussed in your visit today:   Goals Addressed             This Visit's Progress    Cope with Chronic Pain       Timeframe:  Long-Range Goal Priority:  Medium Start Date:     04/15/21                        Expected End Date:    ongoing                   Follow Up Date 12/14/22   - learn relaxation techniques - practice acceptance of chronic pain - practice relaxation or  meditation daily - spend time with positive people - think of new ways to do favorite things - use distraction techniques - use relaxation during pain  - review written information mailed to me on chronic pain   Why is this important?   Stress makes chronic pain feel worse.  Feelings like depression, anxiety, stress and anger can make your body more sensitive to pain.  Learning ways to cope with stress or depression may help you find some relief from the pain.     11/11/22:  Patient started on Cymbalta, has f/u appts scheduled with PCP   Patient verbalizes understanding of instructions and care plan provided today and agrees to view in [place]. Active MyChart status and patient understanding of how to access instructions and care plan via MyChart confirmed with patient.     The Managed Medicaid care management team will reach out to the patient again over the next 30 business  days.  The  Patient  has been provided with contact information for the Managed Medicaid care management team and has been advised to call with any health related questions or concerns.   Hannah Raider RN, BSN [place] Management Coordinator - Managed Medicaid High Risk [phone]   Following is a copy of your plan of care:  Care Plan : [place]  Updates made by [name], RN since 11/11/2022 12:00 AM     Problem: Chronic Disease Management and Care Coordination Needs      Long-Range Goal: Development of Plan Of Care For Chronic Disease Management and Care Coordination Needs To Assist With Meeting Treatment Goals   Start Date: 04/15/2021  Expected End Date: 01/10/2023  Priority: High  Note:   Current Barriers:  Care Coordination needs related eye and dental resources Chronic Disease Management support and education needs related to weight concerns, depression/anxiety, chronic pain, DDD, osteoarthritis 11/11/22:  Patient needs nutrition referral,  started on Cymbalta and seeing LCSW which is helping RNCM Clinical Goal(s): Patient will verbalize basic understanding of weight management and depression disease process and self health management plan .  take all medications exactly as prescribed and will call provider for medication related questions attend all scheduled medical appointments: with primary care and specialist demonstrate ongoing adherence to prescribed treatment plan for  weight management and depression as evidenced by exercise 5-7 days/week adherence to prescribed medication regimen contacting provider for new or worsened symptoms or questions . continue to work with RN Care Manager to address care management and care coordination needs related to weight management and depression work with Education officer, museum to address depression through collaboration with Consulting civil engineer, provider, and care tem.  Interventions Inter-disciplinary  care team collaboration (see longitudinal plan of care) Evaluation of current treatment plan related to  self management and patient's adherence to plan as established by provider Collaborated with LCSW LCSW referral for depression-completed Collaborated with BSW BSW referral for eye and dental providers at patient request-completed PCP sent message for nutrition referral  Weight Loss:  (Status: Goal on track: NO.) Provided patient with contact information for managed Medicaid health plan customer service phone number about weight management benefits  05/13/21- Reviewed weight management benefits offered by patient's health plan and again encouraged her to call customer service for details 08/04/22:  Discussed nutrition referral and/or Healthy Weight and Wellness-patient to f/u with provider 09/08/22:  patient considering nutrition referral  PreDiabetes:  (Status: Goal on track: NO.) Lab Results  Component Value Date   HGBA1C 5.7 (H) 12/06/2019  Assessed patient's understanding of A1c goal:   <5.7% Provided education to patient about basic prediabetes disease process; Provided written information on prediabetes eating plan Counseled on importance of regular laboratory monitoring as prescribed;        Discussed plans with patient for ongoing care management follow up and provided patient with direct contact information for care management team;      09/08/22:  patient considering nutrition referral  Pain:  (Status: New goal. Goal on track: YES.) Pain assessment performed Medications reviewed Reviewed provider established plan for pain management; Discussed importance of adherence to all scheduled medical appointments; Counseled on the importance of reporting any/all new or changed pain symptoms or management strategies to pain management provider; Reviewed with patient prescribed pharmacological and nonpharmacological pain relief strategies; Provided written health plan information related to alternative healing benefits such as  massage and acupuncture 05/13/21- Encouraged patient to review chronic pain education mailed to her last month 08/04/22:  No complaints of pain  Patient Goals/Self-Care Activities: Patient will self administer medications as prescribed  Patient will attend all scheduled provider appointments  Patient will review educational material mailed to her on 04/15/21 Patient will continue to perform ADL's independently Patient will continue to perform IADL's independently Patient will call provider office for new concerns or questions Patient will follow up with  provider regarding medication and weight management

## 2022-11-11 NOTE — Patient Outreach (Signed)
Medicaid Managed Care   Nurse Care Manager Note  11/11/2022 Name:  Hannah Fleming MRN:  [id number] DOB:  09/19/1980  Hannah Fleming is an 42 y.o. year old female who is a primary patient of [name], MD.  The [facility] Managed Care Coordination team was consulted for assistance with:    Chronic healthcare management needs, DDD, osteoarthritis, anxiety/depression, chronic pain  Ms. Biese was given information about Medicaid Managed Care Coordination team services today. [name] Patient agreed to services and verbal consent obtained.  Engaged with patient by telephone for follow up visit in response to provider referral for case management and/or care coordination services.   Assessments/Interventions:  Review of past medical history, allergies, medications, health status, including review of consultants reports, laboratory and other test data, was performed as part of comprehensive evaluation and provision of chronic care management services.  SDOH (Social Determinants of Health) assessments and interventions performed: SDOH Interventions    Flowsheet Row Patient Outreach Telephone from 11/11/2022 in [place] Counselor from 10/22/2022 in [facility] Patient Outreach Telephone from 10/12/2022 in [place] Patient Outreach Telephone from 09/14/2022 in [place] Patient Outreach Telephone from 09/08/2022 in [place] Patient Outreach Telephone from 08/31/2022 in [place] Interventions        Housing Interventions Intervention Not Indicated -- -- -- -- --  Transportation Interventions Intervention Not Indicated -- -- -- -- --  Utilities Interventions -- -- -- -- Intervention Not Indicated --  Alcohol Usage Interventions -- -- -- -- Intervention Not Indicated (Score <7) --  Depression  Interventions/Treatment  -- Medication, Referral to Psychiatry -- -- -- --  Physical Activity Interventions -- -- Intervention Not Indicated -- -- --  Stress Interventions -- -- -- Provide Counseling  [Next Counseling appt on 09/29/22] -- Provide Counseling, Offered [employer]  Social Connections Interventions -- -- Intervention Not Indicated -- -- --     Care Plan  Allergies  Allergen Reactions   Prilosec Otc [Omeprazole Magnesium] Hives   Latex Itching and Rash    Medications Reviewed Today     Reviewed by [name], RN (Registered Nurse) on 11/11/22 at 1117  Med List Status: <None>   Medication Order Taking? Sig Documenting Provider Last Dose Status Informant  acetaminophen (TYLENOL) 500 MG tablet [id number] No Take 1,000 mg by mouth every 6 (six) hours as needed for mild pain or moderate pain.  Patient not taking: Reported on 08/03/2022   [provider] Not Taking Active Self  DULoxetine (CYMBALTA) 20 MG capsule [id number]  Take 1 capsule (20 mg total) by mouth daily. [name], MD  Active   ibuprofen (ADVIL) 600 MG tablet [id number] No Take 1 tablet (600 mg total) by mouth every 6 (six) hours as needed. Constant, Peggy, MD Taking Active   methocarbamol (ROBAXIN) 500 MG tablet [id number] No Take 1 tablet (500 mg total) by mouth daily as needed for muscle spasms. [name], PA-C Taking Active   metroNIDAZOLE (FLAGYL) 500 MG tablet [id number] No Take 1 tablet (500 mg total) by mouth 2 (two) times daily.  Patient not taking: Reported on 08/04/2022   Constant, Peggy, MD Not Taking Active   metroNIDAZOLE 1.3 % GEL 0000000 No Place 1 Applicatorful vaginally 2 (two) times daily.  Patient not taking: Reported on 09/08/2022   [name], MD Not Taking Active   mirtazapine (REMERON)  15 MG tablet [id number] No TAKE 1 TABLET BY MOUTH EVERYDAY AT BEDTIME [name], MD Taking Active   mometasone (ELOCON) 0.1 % lotion [id number]   [provider]  Active   Norethindrone Acetate-Ethinyl Estrad-FE (LOESTRIN 24 FE) 1-20 MG-MCG(24) tablet [id number] No Take 1 tablet by mouth daily. Constant, Peggy, MD Taking Active   oxyCODONE-acetaminophen (PERCOCET/ROXICET) 5-325 MG tablet [id number] No Take 1 tablet by mouth every 6 (six) hours as needed.  Patient not taking: Reported on 08/03/2022   Constant, Peggy, MD Not Taking Active   Prenat-Fe Carbonyl-FA-Omega 3 (ONE-A-DAY WOMENS PRENATAL 1) 28-0.8-235 MG CAPS [id number] No Take 1 capsule by mouth daily.  Patient not taking: Reported on 08/03/2022   [provider] Not Taking Active Self  promethazine (PHENERGAN) 25 MG tablet [id number] No Take 1 tablet (25 mg total) by mouth every 6 (six) hours as needed for nausea or vomiting.  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   Bacterial vaginitis 11/27/2020   Candida vaginitis 11/27/2020   Prediabetes 06/21/2019   Ear itch 03/30/2019   Tonsillar calculus 03/30/2019   Menorrhagia with regular cycle 12/28/2013   Anxiety 02/07/2013   Dysmenorrhea 02/07/2013   Conditions to be addressed/monitored per PCP order:  Chronic healthcare management needs, DDD, osteoarthritis, anxiety/depression, chronic pain  Care Plan : RN Care Manager Plan Of Care  Updates made by [name], RN since 11/11/2022 12:00 AM     Problem: Chronic Disease Management and Care Coordination Needs      Long-Range Goal: Development of Plan Of Care For Chronic Disease Management and Care Coordination Needs To Assist With Meeting Treatment Goals   Start Date: 04/15/2021  Expected End Date: 01/10/2023  Priority: High  Note:   Current Barriers:  Care Coordination needs related eye and dental  resources Chronic Disease Management support and education needs related to weight concerns, depression/anxiety, chronic pain, DDD, osteoarthritis 11/11/22:  Patient needs nutrition referral, started on Cymbalta and seeing LCSW which is helping RNCM Clinical Goal(s): Patient will verbalize basic understanding of weight management and depression disease process and self health management plan .  take all medications exactly as prescribed and will call provider for medication related questions attend all scheduled medical appointments: with primary care and specialist demonstrate ongoing adherence to prescribed treatment plan for  weight management and depression as evidenced by exercise 5-7 days/week adherence to prescribed medication regimen contacting provider for new or worsened symptoms or questions . continue to work with RN Care Manager to address care management and care coordination needs related to weight management and depression work with Education officer, museum to address depression through collaboration with Consulting civil engineer, provider, and care tem.  Interventions Inter-disciplinary  care team collaboration (see longitudinal plan of care) Evaluation of current treatment plan related to  self management and patient's adherence to plan as established by provider Collaborated with LCSW LCSW referral for depression-completed Collaborated with BSW BSW referral for eye and dental providers at patient request-completed PCP sent message for nutrition referral  Weight Loss:  (Status: Goal on track: NO.) Provided patient with contact information for managed Medicaid health plan customer service phone number about weight management benefits  05/13/21- Reviewed weight  management benefits offered by patient's health plan and again encouraged her to call customer service for details 08/04/22:  Discussed nutrition referral and/or Healthy Weight and Wellness-patient to f/u with provider 09/08/22:  patient  considering nutrition referral  PreDiabetes:  (Status: Goal on track: NO.) Lab Results  Component Value Date   HGBA1C 5.7 (H) 12/06/2019  Assessed patient's understanding of A1c goal:  <5.7% Provided education to patient about basic prediabetes disease process; Provided written information on prediabetes eating plan Counseled on importance of regular laboratory monitoring as prescribed;        Discussed plans with patient for ongoing care management follow up and provided patient with direct contact information for care management team;      09/08/22:  patient considering nutrition referral  Pain:  (Status: New goal. Goal on track: YES.) Pain assessment performed Medications reviewed Reviewed provider established plan for pain management; Discussed importance of adherence to all scheduled medical appointments; Counseled on the importance of reporting any/all new or changed pain symptoms or management strategies to pain management provider; Reviewed with patient prescribed pharmacological and nonpharmacological pain relief strategies; Provided written health plan information related to alternative healing benefits such as  massage and acupuncture 05/13/21- Encouraged patient to review chronic pain education mailed to her last month 08/04/22:  No complaints of pain  Patient Goals/Self-Care Activities: Patient will self administer medications as prescribed  Patient will attend all scheduled provider appointments  Patient will review educational material mailed to her on 04/15/21 Patient will continue to perform ADL's independently Patient will continue to perform IADL's independently Patient will call provider office for new concerns or questions Patient will follow up with provider regarding medication and weight management    Follow Up:  Patient agrees to Care Plan and Follow-up.  Plan: The Managed Medicaid care management team will reach out to the patient again over the next 30  business  days. and The  Patient has been provided with contact information for the Managed Medicaid care management team and has been advised to call with any health related questions or concerns.  Date/time of next scheduled RN care management/care coordination outreach:  12/14/22 at 1030.

## 2022-11-17 ENCOUNTER — Encounter: Payer: Self-pay | Admitting: Obstetrics and Gynecology

## 2022-11-17 ENCOUNTER — Other Ambulatory Visit: Payer: Medicaid Other | Admitting: Obstetrics and Gynecology

## 2022-11-17 NOTE — Patient Outreach (Signed)
Care Coordination  11/17/2022  Hannah Fleming [birth year] [id number]  RNCM followed up on Nutrition referral patient requested.  Message left for patient.Aida Raider RN, BSN [facility] Care Management Coordinator - Managed Medicaid High Risk [phone].
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
## 2022-11-30 ENCOUNTER — Encounter: Payer: Self-pay | Admitting: Family Medicine

## 2022-11-30 ENCOUNTER — Ambulatory Visit (INDEPENDENT_AMBULATORY_CARE_PROVIDER_SITE_OTHER): Payer: Medicaid Other | Admitting: Family Medicine

## 2022-11-30 VITALS — BP 117/82 | HR 84 | Temp 98.1°F | Resp 16 | Wt 208.4 lb

## 2022-11-30 DIAGNOSIS — N946 Dysmenorrhea, unspecified: Secondary | ICD-10-CM | POA: Diagnosis not present

## 2022-11-30 DIAGNOSIS — N920 Excessive and frequent menstruation with regular cycle: Secondary | ICD-10-CM

## 2022-11-30 DIAGNOSIS — M2550 Pain in unspecified joint: Secondary | ICD-10-CM

## 2022-11-30 DIAGNOSIS — F32A Depression, unspecified: Secondary | ICD-10-CM

## 2022-11-30 NOTE — Progress Notes (Unsigned)
Established Patient Office Visit  Subjective    Patient ID: Hannah Fleming, female    DOB: Nov 19, 1980  Age: 42 y.o. MRN: [id number]  CC: No chief complaint on file.   HPI Hannah Fleming presents for follow up of chronic med issues. Patient also reports episodes of joint pains after having wine. She reports intermittent headaches persist with use of birth controls.    Outpatient Encounter Medications as of 11/30/2022  Medication Sig   DULoxetine (CYMBALTA) 20 MG capsule Take 1 capsule (20 mg total) by mouth daily.   ibuprofen (ADVIL) 600 MG tablet Take 1 tablet (600 mg total) by mouth every 6 (six) hours as needed.   methocarbamol (ROBAXIN) 500 MG tablet Take 1 tablet (500 mg total) by mouth daily as needed for muscle spasms.   mirtazapine (REMERON) 15 MG tablet TAKE 1 TABLET BY MOUTH EVERYDAY AT BEDTIME   mometasone (ELOCON) 0.1 % lotion    Norethindrone Acetate-Ethinyl Estrad-FE (LOESTRIN 24 FE) 1-20 MG-MCG(24) tablet Take 1 tablet by mouth daily.   acetaminophen (TYLENOL) 500 MG tablet Take 1,000 mg by mouth every 6 (six) hours as needed for mild pain or moderate pain. (Patient not taking: Reported on 08/03/2022)   metroNIDAZOLE (FLAGYL) 500 MG tablet Take 1 tablet (500 mg total) by mouth 2 (two) times daily. (Patient not taking: Reported on 08/04/2022)   metroNIDAZOLE 1.3 % GEL Place 1 Applicatorful vaginally 2 (two) times daily. (Patient not taking: Reported on 09/08/2022)   oxyCODONE-acetaminophen (PERCOCET/ROXICET) 5-325 MG tablet Take 1 tablet by mouth every 6 (six) hours as needed. (Patient not taking: Reported on 08/03/2022)   Prenat-Fe Carbonyl-FA-Omega 3 (ONE-A-DAY WOMENS PRENATAL 1) 28-0.8-235 MG CAPS Take 1 capsule by mouth daily. (Patient not taking: Reported on 08/03/2022)   promethazine (PHENERGAN) 25 MG tablet Take 1 tablet (25 mg total) by mouth every 6 (six) hours as needed for nausea or vomiting. (Patient not taking: Reported on 07/02/2022)   No  facility-administered encounter medications on file as of 11/30/2022.    Past Medical History:  Diagnosis Date   Arthritis    RA   Fibroids    Genital herpes    no outbreak in 110yrs   Trichomonas infection    Urticaria     Past Surgical History:  Procedure Laterality Date   DILATATION & CURETTAGE/HYSTEROSCOPY WITH MYOSURE N/A 10/22/2021   Procedure: DILATATION & CURETTAGE/HYSTEROSCOPY WITH MYOSURE;  Surgeon: [name], MD;  Location: [facility];  Service: Gynecology;  Laterality: N/A;   DILATION AND CURETTAGE OF UTERUS  2020   DILATION AND EVACUATION N/A 07/09/2022   Procedure: DILATATION AND EVACUATION;  Surgeon: Catalina Antigua, MD;  Location: [facility] OR;  Service: Gynecology;  Laterality: N/A;   INDUCED ABORTION     WISDOM TOOTH EXTRACTION      Family History  Problem Relation Age of Onset   Healthy Mother    Sickle cell trait Mother    Healthy Father    Sickle cell trait Sister    Sickle cell trait Brother    Healthy Daughter    Healthy Son    Healthy Son    Other Neg Hx     Social History   Socioeconomic History   Marital status: Single    Spouse name: Not on file   Number of children: 3   Years of education: Not on file   Highest education level: Not on file  Occupational History   Not on file  Tobacco Use   Smoking  status: Former    Packs/day: 1.00    Years: 4.00    Additional pack years: 0.00    Total pack years: 4.00    Types: Cigarettes    Quit date: 02/07/2009    Years since quitting: 13.8    Passive exposure: Never   Smokeless tobacco: Never  Vaping Use   Vaping Use: Never used  Substance and Sexual Activity   Alcohol use: Yes    Comment: OCC   Drug use: No   Sexual activity: Yes    Partners: Male    Birth control/protection: None    Comment: approx [redacted] wks gestation per patient  Other Topics Concern   Not on file  Social History Narrative   Not on file   Social Determinants of Health   Financial Resource Strain:  High Risk (04/15/2021)   Overall Financial Resource Strain (CARDIA)    Difficulty of Paying Living Expenses: Hard  Food Insecurity: No Food Insecurity (11/17/2022)   Hunger Vital Sign    Worried About Running Out of Food in the Last Year: Never true    Ran Out of Food in the Last Year: Never true  Transportation Needs: No Transportation Needs (11/11/2022)   PRAPARE - Administrator, Civil Service (Medical): No    Lack of Transportation (Non-Medical): No  Physical Activity: Sufficiently Active (10/12/2022)   Exercise Vital Sign    Days of Exercise per Week: 7 days    Minutes of Exercise per Session: 30 min  Stress: Stress Concern Present (09/14/2022)   [employer] of Occupational Health - Occupational Stress Questionnaire    Feeling of Stress : To some extent  Social Connections: Socially Integrated (10/12/2022)   Social Connection and Isolation Panel [NHANES]    Frequency of Communication with Friends and Family: More than three times a week    Frequency of Social Gatherings with Friends and Family: More than three times a week    Attends Religious Services: 1 to 4 times per year    Active Member of [employer] or Organizations: Yes    Attends Banker Meetings: 1 to 4 times per year    Marital Status: Living with partner  Intimate Partner Violence: Not At Risk (11/17/2022)   Humiliation, Afraid, Rape, and Kick questionnaire    Fear of Current or Ex-Partner: No    Emotionally Abused: No    Physically Abused: No    Sexually Abused: No    ROS      Objective    BP 117/82   Pulse 84   Temp 98.1 F (36.7 C) (Oral)   Resp 16   Wt 208 lb 6.4 oz (94.5 kg)   LMP 03/24/2022 (Approximate)   SpO2 96%   BMI 35.77 kg/m   Physical Exam  {Labs (Optional):23779}    Assessment & Plan:   Problem List Items Addressed This Visit       Genitourinary   Dysmenorrhea - Primary   Relevant Orders   Ambulatory referral to Gynecology     Other   Menorrhagia with  regular cycle   Relevant Orders   Ambulatory referral to Gynecology    No follow-ups on file.   Tommie Raymond, MD

## 2022-12-01 ENCOUNTER — Encounter: Payer: Self-pay | Admitting: Family Medicine

## 2022-12-03 ENCOUNTER — Encounter: Payer: Self-pay | Admitting: Rheumatology

## 2022-12-03 ENCOUNTER — Ambulatory Visit: Payer: Medicaid Other | Attending: Rheumatology | Admitting: Rheumatology

## 2022-12-03 ENCOUNTER — Encounter: Payer: Self-pay | Admitting: *Deleted

## 2022-12-03 VITALS — BP 130/87 | HR 88 | Wt 207.0 lb

## 2022-12-03 DIAGNOSIS — N96 Recurrent pregnancy loss: Secondary | ICD-10-CM

## 2022-12-03 DIAGNOSIS — M19072 Primary osteoarthritis, left ankle and foot: Secondary | ICD-10-CM

## 2022-12-03 DIAGNOSIS — M62838 Other muscle spasm: Secondary | ICD-10-CM

## 2022-12-03 DIAGNOSIS — R768 Other specified abnormal immunological findings in serum: Secondary | ICD-10-CM

## 2022-12-03 DIAGNOSIS — F419 Anxiety disorder, unspecified: Secondary | ICD-10-CM

## 2022-12-03 DIAGNOSIS — M47816 Spondylosis without myelopathy or radiculopathy, lumbar region: Secondary | ICD-10-CM

## 2022-12-03 DIAGNOSIS — M19042 Primary osteoarthritis, left hand: Secondary | ICD-10-CM

## 2022-12-03 DIAGNOSIS — M7918 Myalgia, other site: Secondary | ICD-10-CM

## 2022-12-03 DIAGNOSIS — M7061 Trochanteric bursitis, right hip: Secondary | ICD-10-CM

## 2022-12-03 DIAGNOSIS — N946 Dysmenorrhea, unspecified: Secondary | ICD-10-CM

## 2022-12-03 DIAGNOSIS — R7303 Prediabetes: Secondary | ICD-10-CM

## 2022-12-03 DIAGNOSIS — M19041 Primary osteoarthritis, right hand: Secondary | ICD-10-CM | POA: Diagnosis not present

## 2022-12-03 DIAGNOSIS — M19071 Primary osteoarthritis, right ankle and foot: Secondary | ICD-10-CM | POA: Diagnosis not present

## 2022-12-03 DIAGNOSIS — M4126 Other idiopathic scoliosis, lumbar region: Secondary | ICD-10-CM

## 2022-12-03 DIAGNOSIS — M797 Fibromyalgia: Secondary | ICD-10-CM | POA: Diagnosis not present

## 2022-12-03 DIAGNOSIS — M170 Bilateral primary osteoarthritis of knee: Secondary | ICD-10-CM

## 2022-12-03 DIAGNOSIS — M7062 Trochanteric bursitis, left hip: Secondary | ICD-10-CM

## 2022-12-03 NOTE — Patient Instructions (Signed)

## 2022-12-14 ENCOUNTER — Other Ambulatory Visit: Payer: Medicaid Other | Admitting: Obstetrics and Gynecology

## 2022-12-14 NOTE — Patient Outreach (Signed)
  Medicaid Managed Care   Unsuccessful Attempt Note   12/14/2022 Name: Hannah Fleming MRN: [id number] DOB: [birth year]  Referred by: [name], MD Reason for referral : High Risk Managed Medicaid (Unsuccessful telephone outreach)  An unsuccessful telephone outreach was attempted today. The patient was referred to the case management team for assistance with care management and care coordination.    Follow Up Plan: The Managed Medicaid care management team will reach out to the patient again over the next 30 business  days. and The  Patient has been provided with contact information for the Managed Medicaid care management team and has been advised to call with any health related questions or concerns.   Kathi Der RN, BSN [place]  [facility] [employer] - Managed Medicaid High Risk [phone]

## 2022-12-14 NOTE — Patient Instructions (Signed)
Hi Ms. Duren, I am sorry I was unable to reach you this morning, I will call again Friday afternoon - as a part of your Medicaid benefit, you are eligible for care management and care coordination services at no cost or copay. I was unable to reach you by phone today but would be happy to help you with your health related needs. Please feel free to call me at [phone].  A member of the Managed Medicaid care management team will reach out to you again over the next 30 business  days.   Kathi Der RN, BSN [place]  [facility] - Managed Medicaid High Risk [phone]

## 2022-12-17 ENCOUNTER — Encounter: Payer: Self-pay | Admitting: Obstetrics and Gynecology

## 2022-12-17 ENCOUNTER — Other Ambulatory Visit: Payer: Medicaid Other | Admitting: Obstetrics and Gynecology

## 2022-12-17 NOTE — Patient Instructions (Signed)
[name], thanks for speaking with me-I am glad you have the upcoming appointments and I hope they go well.  Hannah Fleming was given information about Medicaid Managed Care team care coordination services as a part of their [facility] Medicaid benefit. Hannah Fleming verbally consented to engagement with the [facility] Managed Care team.   If you are experiencing a medical emergency, please call 911 or report to your local emergency department or urgent care.   If you have a non-emergency medical problem during routine business hours, please contact your provider's office and ask to speak with a nurse.   For questions related to your [facility], please call: [phone] or visit the homepage here: [email or web address]  If you would like to schedule transportation through your [facility], please call the following number at least 2 days in advance of your appointment: [phone]   Rides for urgent appointments can also be made after hours by calling Member Services.  Call the Behavioral Health Crisis Line at [phone], at any time, 24 hours a day, 7 days a week. If you are in danger or need immediate medical attention call 911.  If you would like help to quit smoking, call 1-800-QUIT-NOW (2061832440) OR Espaol: 1-855-Djelo-Ya (2-536-644-0347) o para ms informacin haga clic aqu or Text READY to 425-956 to register via text  Hannah Fleming - following are the goals we discussed in your visit today:   Goals Addressed             This Visit's Progress    Cope with Chronic Pain       Timeframe:  Long-Range Goal Priority:  Medium Start Date:     04/15/21                        Expected End Date:    ongoing                   Follow Up Date 02/01/23   - learn relaxation techniques - practice acceptance of chronic pain - practice relaxation or meditation  daily - spend time with positive people - think of new ways to do favorite things - use distraction techniques - use relaxation during pain  - review written information mailed to me on chronic pain   Why is this important?   Stress makes chronic pain feel worse.  Feelings like depression, anxiety, stress and anger can make your body more sensitive to pain.  Learning ways to cope with stress or depression may help you find some relief from the pain.    12/17/22:  patient to start PT-has Nutrition appt 6/3     COMPLETED: Find Help in My Community       Timeframe:  Short-Term Goal Priority:  High Start Date:       04/15/21                      Expected End Date:    ongoing                   Follow Up Date 11/10/22   - follow-up on any referrals for help I am given    Why is this important?   Knowing how and where to find help for yourself or family in your neighborhood and community is an important skill.  You will want to take some steps to learn how.  10/12/22:  Patient has received eye and dental resources     COMPLETED: Learn More About My Health       Timeframe:  Long-Range Goal Priority:  High Start Date:   04/15/21                          Expected End Date:     ongoing                  Follow Up Date 11/10/22   - make a list of questions - ask questions - review educational material mailed to me  on prediabetes and chronic pain - call my health plan customer service number to secure replacement card and to find out about benefits related to weight management, phone program and earning money via completing wellness activities  - review information mailed to me about health plan benefits   Why is this important?   The best way to learn about your health and care is by talking to the doctor and nurse.  They will answer your questions and give you information in the way that you like best.    10/12/22:  patient following up with insurance   Patient verbalizes  understanding of instructions and care plan provided today and agrees to view in MyChart. Active MyChart status and patient understanding of how to access instructions and care plan via MyChart confirmed with patient.     The Managed Medicaid care management team will reach out to the patient again over the next 45 business  days.  The  Patient  has been provided with contact information for the Managed Medicaid care management team and has been advised to call with any health related questions or concerns.   Kathi Der RN, BSN [place]  [facility] Care Management Coordinator - Managed Medicaid High Risk [phone]   Following is a copy of your plan of care:  Care Plan : RN Care Manager Plan Of Care  Updates made by Danie Chandler, RN since 12/17/2022 12:00 AM     Problem: Chronic Disease Management and Care Coordination Needs      Long-Range Goal: Development of Plan Of Care For Chronic Disease Management and Care Coordination Needs To Assist With Meeting Treatment Goals   Start Date: 04/15/2021  Expected End Date: 03/18/2023  Priority: High  Note:   Current Barriers:  Care Coordination needs related eye and dental resources Chronic Disease Management support and education needs related to weight concerns, depression/anxiety, chronic pain, DDD, osteoarthritis 12/17/22:  patient without complaint today-did receive a nutrition referral for 6/3 and to start PT for pain  RNCM Clinical Goal(s): Patient will verbalize basic understanding of weight management and depression disease process and self health management plan .  take all medications exactly as prescribed and will call provider for medication related questions attend all scheduled medical appointments: with primary care and specialist demonstrate ongoing adherence to prescribed treatment plan for  weight management and depression as evidenced by exercise 5-7 days/week adherence to prescribed medication regimen  contacting provider for new or worsened symptoms or questions . continue to work with RN Care Manager to address care management and care coordination needs related to weight management and depression work with Child psychotherapist to address depression through collaboration with Medical illustrator, provider, and care tem.   Interventions Inter-disciplinary  care team collaboration (see longitudinal plan of care) Evaluation of current treatment plan related to  self management  and patient's adherence to plan as established by provider Collaborated with LCSW LCSW referral for depression-completed Collaborated with BSW BSW referral for eye and dental providers at patient request-completed PCP sent message for nutrition referral-completed  Weight Loss:  (Status: Goal on track: NO.) Provided patient with contact information for managed Medicaid health plan customer service phone number about weight management benefits  05/13/21- Reviewed weight management benefits offered by patient's health plan and again encouraged her to call customer service for details  PreDiabetes:  (Status: Goal on track: NO.) Lab Results  Component Value Date   HGBA1C 5.7 (H) 12/06/2019  Assessed patient's understanding of A1c goal:  <5.7% Provided education to patient about basic prediabetes disease process; Provided written information on prediabetes eating plan Counseled on importance of regular laboratory monitoring as prescribed;        Discussed plans with patient for ongoing care management follow up and provided patient with direct contact information for care management team;       Pain:  (Status: New goal. Goal on track: YES.) Pain assessment performed Medications reviewed Reviewed provider established plan for pain management; Discussed importance of adherence to all scheduled medical appointments; Counseled on the importance of reporting any/all new or changed pain symptoms or management strategies to pain  management provider; Reviewed with patient prescribed pharmacological and nonpharmacological pain relief strategies; Provided written health plan information related to alternative healing benefits such as  massage and acupuncture 12/17/22, patient to start PT  Patient Goals/Self-Care Activities: Patient will self administer medications as prescribed  Patient will attend all scheduled provider appointments  Patient will review educational material mailed to her on 04/15/21 Patient will continue to perform ADL's independently Patient will continue to perform IADL's independently Patient will call provider office for new concerns or questions Patient will follow up with provider regarding medication and weight management

## 2022-12-17 NOTE — Patient Outreach (Signed)
Medicaid Managed Care   Nurse Care Manager Note  12/17/2022 Name:  Hannah Fleming MRN:  [id number] DOB:  [birth year]  Hannah Fleming is an 42 y.o. year old female who is a primary patient of [name], MD.  The [facility] Managed Care Coordination team was consulted for assistance with:    Chronic healthcare management needs, DDD, anxiety/depression, chronic pain, osteoarthritis, fibromyalgia, myofascial pain syndrome, positive RNP  Ms. Knezevic was given information about Medicaid Managed Care Coordination team services today. [name] Patient agreed to services and verbal consent obtained.  Engaged with patient by telephone for follow up visit in response to provider referral for case management and/or care coordination services.   Assessments/Interventions:  Review of past medical history, allergies, medications, health status, including review of consultants reports, laboratory and other test data, was performed as part of comprehensive evaluation and provision of chronic care management services.  SDOH (Social Determinants of Health) assessments and interventions performed: SDOH Interventions    Flowsheet Row Patient Outreach Telephone from 12/17/2022 in [place] POPULATION HEALTH DEPARTMENT Patient Outreach Telephone from 11/17/2022 in [place] POPULATION HEALTH DEPARTMENT Patient Outreach Telephone from 11/11/2022 in [place] POPULATION HEALTH DEPARTMENT Patient Outreach Telephone from 10/12/2022 in [place] POPULATION HEALTH DEPARTMENT Patient Outreach Telephone from 09/14/2022 in [place] POPULATION HEALTH DEPARTMENT Patient Outreach Telephone from 09/08/2022 in [place] POPULATION HEALTH DEPARTMENT  SDOH Interventions        Food Insecurity Interventions -- Intervention Not Indicated -- -- -- --  Housing Interventions -- -- Intervention Not Indicated -- -- --  Transportation Interventions -- -- Intervention Not Indicated -- -- --  Utilities Interventions  Intervention Not Indicated -- -- -- -- Intervention Not Indicated  Alcohol Usage Interventions Intervention Not Indicated (Score <7) -- -- -- -- Intervention Not Indicated (Score <7)  Physical Activity Interventions -- -- -- Intervention Not Indicated -- --  Stress Interventions -- -- -- -- Provide Counseling  [Next Counseling appt on 09/29/22] --  Social Connections Interventions -- -- -- Intervention Not Indicated -- --     Care Plan  Allergies  Allergen Reactions   Prilosec Otc [Omeprazole Magnesium] Hives   Latex Itching and Rash    Medications Reviewed Today     Reviewed by Danie Chandler, RN (Registered Nurse) on 12/17/22 at 1539  Med List Status: <None>   Medication Order Taking? Sig Documenting Provider Last Dose Status Informant  acetaminophen (TYLENOL) 500 MG tablet [id number] No Take 1,000 mg by mouth every 6 (six) hours as needed for mild pain or moderate pain.  Patient not taking: Reported on 12/03/2022   [provider] Not Taking Active Self  DULoxetine (CYMBALTA) 20 MG capsule [id number] No Take 1 capsule (20 mg total) by mouth daily. [name], MD Taking Active   ibuprofen (ADVIL) 600 MG tablet [id number] No Take 1 tablet (600 mg total) by mouth every 6 (six) hours as needed. Constant, Peggy, MD Taking Active   methocarbamol (ROBAXIN) 500 MG tablet [id number] No Take 1 tablet (500 mg total) by mouth daily as needed for muscle spasms. Gearldine Bienenstock, PA-C Taking Active   metroNIDAZOLE (FLAGYL) 500 MG tablet [id number] No Take 1 tablet (500 mg total) by mouth 2 (two) times daily.  Patient not taking: Reported on 08/04/2022   Constant, Peggy, MD Not Taking Active   metroNIDAZOLE 1.3 % GEL [id number] No Place 1 Applicatorful vaginally 2 (two) times daily.  Patient not taking: Reported on 09/08/2022   [name],  MD Not Taking Active   mirtazapine (REMERON) 15 MG tablet [id number] No TAKE 1 TABLET BY MOUTH EVERYDAY AT BEDTIME  Patient not taking: Reported on  12/03/2022   [name], MD Not Taking Active   mometasone (ELOCON) 0.1 % lotion [id number] No   Patient not taking: Reported on 12/03/2022   [provider] Not Taking Active   Norethindrone Acetate-Ethinyl Estrad-FE (LOESTRIN 24 FE) 1-20 MG-MCG(24) tablet [id number] No Take 1 tablet by mouth daily.  Patient not taking: Reported on 12/03/2022   Constant, Peggy, MD Not Taking Active   oxyCODONE-acetaminophen (PERCOCET/ROXICET) 5-325 MG tablet [id number] No Take 1 tablet by mouth every 6 (six) hours as needed.  Patient not taking: Reported on 08/03/2022   Constant, Peggy, MD Not Taking Active   Prenat-Fe Carbonyl-FA-Omega 3 (ONE-A-DAY WOMENS PRENATAL 1) 28-0.8-235 MG CAPS [id number] No Take 1 capsule by mouth daily.  Patient not taking: Reported on 12/03/2022   [provider] Not Taking Active Self  promethazine (PHENERGAN) 25 MG tablet [id number] No Take 1 tablet (25 mg total) by mouth every 6 (six) hours as needed for nausea or vomiting. [name], MD Taking Active Self           Patient Active Problem List   Diagnosis Date Noted   Missed abortion 07/07/2022   Supervision of other normal pregnancy, antepartum 05/26/2022   Fibroids, intramural    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/17/2021   Bacterial vaginitis 11/27/2020   Candida vaginitis 11/27/2020   Prediabetes 06/21/2019   Ear itch 03/30/2019   Tonsillar calculus 03/30/2019   Menorrhagia with regular cycle 12/28/2013   Anxiety 02/07/2013   Dysmenorrhea 02/07/2013   Conditions to be addressed/monitored per PCP order:  Chronic healthcare management needs, DDD, anxiety/depression, chronic pain, osteoarthritis, fibromyalgia, myofascial pain syndrome, positive RNP  Care Plan : RN Care Manager Plan Of Care  Updates made by Danie Chandler, RN since 12/17/2022 12:00 AM     Problem: Chronic Disease Management and Care Coordination Needs      Long-Range Goal: Development of Plan Of Care For Chronic Disease  Management and Care Coordination Needs To Assist With Meeting Treatment Goals   Start Date: 04/15/2021  Expected End Date: 03/18/2023  Priority: High  Note:   Current Barriers:  Care Coordination needs related eye and dental resources Chronic Disease Management support and education needs related to weight concerns, depression/anxiety, chronic pain, DDD, osteoarthritis 12/17/22:  patient without complaint today-did receive a nutrition referral for 6/3 and to start PT for pain  RNCM Clinical Goal(s): Patient will verbalize basic understanding of weight management and depression disease process and self health management plan .  take all medications exactly as prescribed and will call provider for medication related questions attend all scheduled medical appointments: with primary care and specialist demonstrate ongoing adherence to prescribed treatment plan for  weight management and depression as evidenced by exercise 5-7 days/week adherence to prescribed medication regimen contacting provider for new or worsened symptoms or questions . continue to work with RN Care Manager to address care management and care coordination needs related to weight management and depression work with Child psychotherapist to address depression through collaboration with Medical illustrator, provider, and care tem.   Interventions Inter-disciplinary  care team collaboration (see longitudinal plan of care) Evaluation of current treatment plan related to  self management and patient's adherence to plan as established by provider Collaborated with LCSW LCSW referral for depression-completed Collaborated with BSW BSW referral for eye and dental providers at  patient request-completed PCP sent message for nutrition referral-completed  Weight Loss:  (Status: Goal on track: NO.) Provided patient with contact information for managed Medicaid health plan customer service phone number about weight management benefits  05/13/21-  Reviewed weight management benefits offered by patient's health plan and again encouraged her to call customer service for details  PreDiabetes:  (Status: Goal on track: NO.) Lab Results  Component Value Date   HGBA1C 5.7 (H) 12/06/2019  Assessed patient's understanding of A1c goal:  <5.7% Provided education to patient about basic prediabetes disease process; Provided written information on prediabetes eating plan Counseled on importance of regular laboratory monitoring as prescribed;        Discussed plans with patient for ongoing care management follow up and provided patient with direct contact information for care management team;       Pain:  (Status: New goal. Goal on track: YES.) Pain assessment performed Medications reviewed Reviewed provider established plan for pain management; Discussed importance of adherence to all scheduled medical appointments; Counseled on the importance of reporting any/all new or changed pain symptoms or management strategies to pain management provider; Reviewed with patient prescribed pharmacological and nonpharmacological pain relief strategies; Provided written health plan information related to alternative healing benefits such as  massage and acupuncture 12/17/22, patient to start PT  Patient Goals/Self-Care Activities: Patient will self administer medications as prescribed  Patient will attend all scheduled provider appointments  Patient will review educational material mailed to her on 04/15/21 Patient will continue to perform ADL's independently Patient will continue to perform IADL's independently Patient will call provider office for new concerns or questions Patient will follow up with provider regarding medication and weight management    Follow Up:  Patient agrees to Care Plan and Follow-up.  Plan: The Managed Medicaid care management team will reach out to the patient again over the next 45 business  days. and The  Patient has been  provided with contact information for the Managed Medicaid care management team and has been advised to call with any health related questions or concerns.  Date/time of next scheduled RN care management/care coordination outreach:  02/01/23 at  1030

## 2022-12-22 ENCOUNTER — Ambulatory Visit: Payer: Medicaid Other | Admitting: Physical Therapy
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
## 2023-01-07 ENCOUNTER — Encounter: Payer: Self-pay | Admitting: Physical Therapy

## 2023-01-07 ENCOUNTER — Other Ambulatory Visit: Payer: Self-pay

## 2023-01-07 ENCOUNTER — Ambulatory Visit: Payer: Medicaid Other | Attending: Rheumatology | Admitting: Physical Therapy

## 2023-01-07 DIAGNOSIS — M5459 Other low back pain: Secondary | ICD-10-CM | POA: Insufficient documentation

## 2023-01-07 DIAGNOSIS — M47816 Spondylosis without myelopathy or radiculopathy, lumbar region: Secondary | ICD-10-CM | POA: Diagnosis not present

## 2023-01-07 DIAGNOSIS — M797 Fibromyalgia: Secondary | ICD-10-CM | POA: Insufficient documentation

## 2023-01-07 DIAGNOSIS — M6281 Muscle weakness (generalized): Secondary | ICD-10-CM | POA: Insufficient documentation

## 2023-01-25 ENCOUNTER — Encounter: Payer: Medicaid Other | Attending: Family Medicine | Admitting: Dietician

## 2023-01-25 ENCOUNTER — Encounter: Payer: Self-pay | Admitting: Dietician

## 2023-01-25 VITALS — Ht 64.0 in | Wt 205.1 lb

## 2023-01-25 DIAGNOSIS — E669 Obesity, unspecified: Secondary | ICD-10-CM | POA: Insufficient documentation

## 2023-01-25 NOTE — Progress Notes (Signed)
Medical Nutrition Therapy  Appointment Start time:  2:12  Appointment End time:  3:14  Primary concerns today: weight; fibromyalgia and inflammation; high blood pressure Referral diagnosis: Obesity Preferred learning style: no preference indicated (auditory, visual, hands on, no preference indicated) Learning readiness: preparation (not ready, contemplating, ready, change in progress)  NUTRITION ASSESSMENT   Anthropometrics  Weight: 205.1 lbs Height:  64 in BMI: 35.21 kg/m   Clinical Medical Hx: arthritis, fibromyalgia  Medications: Cymbalta, Robaxin, ibuprofen Labs: RNP antibody (pos) Notable Signs/Symptoms: none noted  Lifestyle & Dietary Hx  Pt states in November she had a miscarriage and weighed more then, stating she was 213 lbs then. Pt states she still is weighing too much, stating her back hurts. Pt states she is constantly in pain every day. Pt states her doctor took her off birth control medication to rule out that causing weight retention. Pt states she does not drink alcohol anymore, stating she used to drink wine. Pt states she doesn't eat a lot of sweets. Pt states she does not eat beef or pork  Estimated daily fluid intake: 36-40 oz Supplements:  Sleep: not really good right now; back and forth. Pt states she gets up about 3 am to use the bathroom and find it difficult to fall back asleep. Stress / self-care: quite spots; physical activity Current average weekly physical activity: re-hab (every other week), aquatics   24-Hr Dietary Recall First Meal: biscuit, [place] sausage or cereal, breakfast bowl, orange juice or apple juice or fruit Snack: occasionally chips or nuts or fruit Second Meal: left overs or [place] sub or salad Snack:  Third Meal: baked chicken, broccoli, rice Snack: ice cream every now and then Beverages: water, sweet tea, ginger ale at meals, juice  Estimated Energy Needs Calories: 1600  NUTRITION DIAGNOSIS  NB-1.1 Food and  nutrition-related knowledge deficit As related to excess energy intake and decrease physical activity.  As evidenced by dietary recall and activity level.   NUTRITION INTERVENTION  Nutrition education (E-1) on the following topics:  Fruits & Vegetables: Aim to fill half your plate with a variety of fruits and vegetables. They are rich in vitamins, minerals, and fiber, and can help reduce the risk of chronic diseases. Choose a colorful assortment of fruits and vegetables to ensure you get a wide range of nutrients. Grains and Starches: Make at least half of your grain choices whole grains, such as Willine Schwalbe rice, whole wheat bread, and oats. Whole grains provide fiber, which aids in digestion and healthy cholesterol levels. Aim for whole forms of starchy vegetables such as potatoes, sweet potatoes, beans, peas, and corn, which are fiber rich and provide many vitamins and minerals.  Protein: Incorporate lean sources of protein, such as poultry, fish, beans, nuts, and seeds, into your meals. Protein is essential for building and repairing tissues, staying full, balancing blood sugar, as well as supporting immune function. Dairy: Include low-fat or fat-free dairy products like milk, yogurt, and cheese in your diet. Dairy foods are excellent sources of calcium and vitamin D, which are crucial for bone health.  Physical Activity: Aim for 150 minutes of physical activity weekly. Regular physical activity promotes overall health-including helping to reduce risk for heart disease and diabetes, promoting mental health, and helping [place] sleep better.  Why you need complex carbohydrates: Whole grains and other complex carbohydrates are required to have a healthy diet. Whole grains provide fiber which can help with blood glucose levels and help keep you satiated. Fruits and starchy vegetables provide  essential vitamins and minerals required for immune function, eyesight support, brain support, bone density, wound healing and  many other functions within the body. According to the current evidenced based 2020-2025 Dietary Guidelines for Americans, complex carbohydrates are part of a healthy eating pattern which is associated with a decreased risk for type 2 diabetes, cancers, and cardiovascular disease.  Encouraged patient to honor their body's internal hunger and fullness cues.  Throughout the day, check in mentally and rate hunger. Stop eating when satisfied not full regardless of how much food is left on the plate.  Get more if still hungry 20-30 minutes later.  The key is to honor satisfaction so throughout the meal, rate fullness factor and stop when comfortably satisfied not physically full. The key is to honor hunger and fullness without any feelings of guilt or shame.  Pay attention to what the internal cues are, rather than any external factors. This will enhance the confidence you have in listening to your own body and following those internal cues enabling you to increase how often you eat when you are hungry not out of appetite and stop when you are satisfied not full.  Encouraged pt to continue to eat balanced meals inclusive of non starchy vegetables 2 times a day 7 days a week Encouraged pt to choose lean protein sources: limiting beef, pork, sausage, hotdogs, and lunch meat Encourage pt to choose healthy fats such as plant based limiting animal fats Encouraged pt to continue to drink a minium 64 fluid ounces with half being plain water to satisfy proper hydration    Handouts Provided Include  Health Benefits of Physical Activity Types of Fat (saturated vs unsaturated)  Learning Style & Readiness for Change Teaching method utilized: Visual & Auditory  Demonstrated degree of understanding via: Teach Back  Barriers to learning/adherence to lifestyle change: fibromyalgia  Goals Established by Pt Meal prep using meal ideas handout; avoid skipping meals; and watch portions sizes Avoid simple carbohydrates, like  soda, juice; eat complex carbohydrates Increase physical activity;    MONITORING & EVALUATION Dietary intake, weekly physical activity.  Next Steps  Patient is to follow-up in August.

## 2023-01-27 ENCOUNTER — Ambulatory Visit: Payer: Medicaid Other | Admitting: Physical Therapy

## 2023-01-29 ENCOUNTER — Telehealth: Payer: Self-pay | Admitting: Physical Therapy
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
## 2023-01-29 NOTE — Telephone Encounter (Signed)
LVM to inform of schedule conflict for appt on 07/03. I called to offer her a 2:45 or a 3:30 for this day. LVM with name and number\

## 2023-02-01 ENCOUNTER — Other Ambulatory Visit: Payer: Medicaid Other | Admitting: Obstetrics and Gynecology

## 2023-02-01 ENCOUNTER — Encounter: Payer: Self-pay | Admitting: Physical Therapy

## 2023-02-01 ENCOUNTER — Other Ambulatory Visit: Payer: Self-pay | Admitting: Family Medicine

## 2023-02-01 ENCOUNTER — Ambulatory Visit: Payer: Medicaid Other | Admitting: Physical Therapy

## 2023-02-01 ENCOUNTER — Encounter: Payer: Self-pay | Admitting: Obstetrics and Gynecology

## 2023-02-01 ENCOUNTER — Ambulatory Visit: Payer: Medicaid Other | Attending: Rheumatology | Admitting: Physical Therapy

## 2023-02-01 DIAGNOSIS — M5459 Other low back pain: Secondary | ICD-10-CM | POA: Diagnosis present

## 2023-02-01 DIAGNOSIS — M6281 Muscle weakness (generalized): Secondary | ICD-10-CM | POA: Diagnosis present

## 2023-02-01 NOTE — Patient Outreach (Signed)
Medicaid Managed Care   Nurse Care Manager Note  02/01/2023 Name:  Hannah Fleming MRN:  [id number] DOB:  12-Feb-1981  Hannah Fleming is an 42 y.o. year old female who is a primary patient of [name], MD.  The [facility] Managed Care Coordination team was consulted for assistance with:    Chronic healthcare management needs, anxiety/depression, chronic pain, osteoarthritis, fibromyalgia, positive RNP, LBP, myofascial pain syndrome  Hannah Fleming was given information about Medicaid Managed Care Coordination team services today. Hannah Fleming Patient agreed to services and verbal consent obtained.  Engaged with patient by telephone for follow up visit in response to provider referral for case management and/or care coordination services.   Assessments/Interventions:  Review of past medical history, allergies, medications, health status, including review of consultants reports, laboratory and other test data, was performed as part of comprehensive evaluation and provision of chronic care management services.  SDOH (Social Determinants of Health) assessments and interventions performed: SDOH Interventions    Flowsheet Row Patient Outreach Telephone from 02/01/2023 in [place] POPULATION HEALTH DEPARTMENT Patient Outreach Telephone from 12/17/2022 in [place] POPULATION HEALTH DEPARTMENT Patient Outreach Telephone from 11/17/2022 in [place] POPULATION HEALTH DEPARTMENT Patient Outreach Telephone from 11/11/2022 in [place] POPULATION HEALTH DEPARTMENT Patient Outreach Telephone from 10/12/2022 in [place] POPULATION HEALTH DEPARTMENT Patient Outreach Telephone from 09/14/2022 in [place] POPULATION HEALTH DEPARTMENT  SDOH Interventions        Food Insecurity Interventions -- -- Intervention Not Indicated -- -- --  Housing Interventions -- -- -- Intervention Not Indicated -- --  Transportation Interventions -- -- -- Intervention Not Indicated -- --  Utilities Interventions --  Intervention Not Indicated -- -- -- --  Alcohol Usage Interventions -- Intervention Not Indicated (Score <7) -- -- -- --  Financial Strain Interventions Intervention Not Indicated -- -- -- -- --  Physical Activity Interventions -- -- -- -- Intervention Not Indicated --  Stress Interventions Intervention Not Indicated -- -- -- -- Provide Counseling  [Next Counseling appt on 09/29/22]  Social Connections Interventions -- -- -- -- Intervention Not Indicated --     Care Plan  Allergies  Allergen Reactions   Prilosec Otc [Omeprazole Magnesium] Hives   Latex Itching and Rash    Medications Reviewed Today     Reviewed by Danie Chandler, RN (Registered Nurse) on 02/01/23 at 1029  Med List Status: <None>   Medication Order Taking? Sig Documenting Provider Last Dose Status Informant  acetaminophen (TYLENOL) 500 MG tablet [id number]  Take 1,000 mg by mouth every 6 (six) hours as needed for mild pain or moderate pain.  Patient not taking: Reported on 12/03/2022   [provider]  Active Self  DULoxetine (CYMBALTA) 20 MG capsule [id number]  Take 1 capsule (20 mg total) by mouth daily. [name], MD  Active   ibuprofen (ADVIL) 600 MG tablet [id number]  Take 1 tablet (600 mg total) by mouth every 6 (six) hours as needed. Constant, Peggy, MD  Active   methocarbamol (ROBAXIN) 500 MG tablet [id number]  Take 1 tablet (500 mg total) by mouth daily as needed for muscle spasms. Gearldine Bienenstock, PA-C  Active   metroNIDAZOLE (FLAGYL) 500 MG tablet [id number]  Take 1 tablet (500 mg total) by mouth 2 (two) times daily.  Patient not taking: Reported on 08/04/2022   Constant, Peggy, MD  Active   metroNIDAZOLE 1.3 % GEL [id number]  Place 1 Applicatorful vaginally 2 (two) times daily.  Patient not taking: Reported  on 09/08/2022   [name], MD  Active   mirtazapine (REMERON) 15 MG tablet [id number]  TAKE 1 TABLET BY MOUTH EVERYDAY AT BEDTIME  Patient not taking: Reported on 12/03/2022   [name], MD   Active   mometasone (ELOCON) 0.1 % lotion [id number]    Patient not taking: Reported on 12/03/2022   [provider]  Active   Norethindrone Acetate-Ethinyl Estrad-FE (LOESTRIN 24 FE) 1-20 MG-MCG(24) tablet [id number]  Take 1 tablet by mouth daily.  Patient not taking: Reported on 12/03/2022   Constant, Peggy, MD  Active   oxyCODONE-acetaminophen (PERCOCET/ROXICET) 5-325 MG tablet [id number]  Take 1 tablet by mouth every 6 (six) hours as needed.  Patient not taking: Reported on 08/03/2022   Constant, Peggy, MD  Active   Prenat-Fe Carbonyl-FA-Omega 3 (ONE-A-DAY WOMENS PRENATAL 1) 28-0.8-235 MG CAPS [id number]  Take 1 capsule by mouth daily.  Patient not taking: Reported on 12/03/2022   [provider]  Active Self  promethazine (PHENERGAN) 25 MG tablet [id number]  Take 1 tablet (25 mg total) by mouth every 6 (six) hours as needed for nausea or vomiting. [name], MD  Active Self           Patient Active Problem List   Diagnosis Date Noted   Missed abortion 07/07/2022   Supervision of other normal pregnancy, antepartum 05/26/2022   Fibroids, intramural    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/17/2021   Bacterial vaginitis 11/27/2020   Candida vaginitis 11/27/2020   Prediabetes 06/21/2019   Ear itch 03/30/2019   Tonsillar calculus 03/30/2019   Menorrhagia with regular cycle 12/28/2013   Anxiety 02/07/2013   Dysmenorrhea 02/07/2013   Conditions to be addressed/monitored per PCP order:  Chronic healthcare management needs, anxiety/depression, chronic pain, osteoarthritis, fibromyalgia, positive RNP, LBP, myofascial pain syndrome  Care Plan : RN Care Manager Plan Of Care  Updates made by Danie Chandler, RN since 02/01/2023 12:00 AM     Problem: Chronic Disease Management and Care Coordination Needs      Long-Range Goal: Development of Plan Of Care For Chronic Disease Management and Care Coordination Needs To Assist With Meeting Treatment Goals   Start Date: 04/15/2021   Expected End Date: 03/18/2023  Priority: High  Note:   Current Barriers:  Care Coordination needs related eye and dental resources Chronic Disease Management support and education needs related to weight concerns, depression/anxiety, chronic pain, DDD, osteoarthritis 46/10/24:  Patient currently seeing nutritionist and PT.  Patient to follow up with PCP regarding headaches, has appt 6/18.  RNCM Clinical Goal(s): Patient will verbalize basic understanding of weight management and depression disease process and self health management plan .  take all medications exactly as prescribed and will call provider for medication related questions attend all scheduled medical appointments: with primary care and specialist demonstrate ongoing adherence to prescribed treatment plan for  weight management and depression as evidenced by exercise 5-7 days/week adherence to prescribed medication regimen contacting provider for new or worsened symptoms or questions . continue to work with RN Care Manager to address care management and care coordination needs related to weight management and depression work with Child psychotherapist to address depression through collaboration with Medical illustrator, provider, and care tem.   Interventions Inter-disciplinary  care team collaboration (see longitudinal plan of care) Evaluation of current treatment plan related to  self management and patient's adherence to plan as established by provider Collaborated with LCSW LCSW referral for depression-completed Collaborated with BSW BSW referral for eye and dental  providers at patient request-completed PCP sent message for nutrition referral-completed  Weight Loss:  (Status: Goal on track: NO.) Provided patient with contact information for managed Medicaid health plan customer service phone number about weight management benefits  05/13/21- Reviewed weight management benefits offered by patient's health plan and again encouraged her  to call customer service for details  PreDiabetes:  (Status: Goal on track: NO.) Lab Results  Component Value Date   HGBA1C 5.7 (H) 12/06/2019  Assessed patient's understanding of A1c goal:  <5.7% Provided education to patient about basic prediabetes disease process; Provided written information on prediabetes eating plan Counseled on importance of regular laboratory monitoring as prescribed;        Discussed plans with patient for ongoing care management follow up and provided patient with direct contact information for care management team;       Pain:  (Status: New goal. Goal on track: YES.) Pain assessment performed Medications reviewed Reviewed provider established plan for pain management; Discussed importance of adherence to all scheduled medical appointments; Counseled on the importance of reporting any/all new or changed pain symptoms or management strategies to pain management provider; Reviewed with patient prescribed pharmacological and nonpharmacological pain relief strategies; Provided written health plan information related to alternative healing benefits such as  massage and acupuncture 12/17/22, patient to start PT  Patient Goals/Self-Care Activities: Patient will self administer medications as prescribed  Patient will attend all scheduled provider appointments  Patient will review educational material mailed to her on 04/15/21 Patient will continue to perform ADL's independently Patient will continue to perform IADL's independently Patient will call provider office for new concerns or questions Patient will follow up with provider regarding medication and weight management    Follow Up:  Patient agrees to Care Plan and Follow-up.  Plan: The Managed Medicaid care management team will reach out to the patient again over the next 30 business  days. and The  Patient has been provided with contact information for the Managed Medicaid care management team and has been  advised to call with any health related questions or concerns.  Date/time of next scheduled RN care management/care coordination outreach: 03/03/23 at 0900.

## 2023-02-01 NOTE — Patient Instructions (Signed)
[name] , glad you are doing well today-have a great week!!  Hannah Fleming was given information about Medicaid Managed Care team care coordination services as a part of their [facility] Community Plan Medicaid benefit. Hannah Fleming verbally consented to engagement with the [facility] Managed Care team.   If you are experiencing a medical emergency, please call 911 or report to your local emergency department or urgent care.   If you have a non-emergency medical problem during routine business hours, please contact your provider's office and ask to speak with a nurse.   For questions related to your [facility], please call: [phone] or visit the homepage here: [email or web address]  If you would like to schedule transportation through your [facility], please call the following number at least 2 days in advance of your appointment: [phone]   Rides for urgent appointments can also be made after hours by calling Member Services.  Call the Behavioral Health Crisis Line at [phone], at any time, 24 hours a day, 7 days a week. If you are in danger or need immediate medical attention call 911.  If you would like help to quit smoking, call 1-800-QUIT-NOW ((817) 644-9380) OR Espaol: 1-855-Djelo-Ya (1-324-401-0272) o para ms informacin haga clic aqu or Text READY to 536-644 to register via text  Hannah Fleming - following are the goals we discussed in your visit today:   Goals Addressed             This Visit's Progress    Cope with Chronic Pain       Timeframe:  Long-Range Goal Priority:  Medium Start Date:     04/15/21                        Expected End Date:    ongoing                   Follow Up Date 03/03/23   - learn relaxation techniques - practice acceptance of chronic pain - practice relaxation or meditation daily - spend time with positive people -  think of new ways to do favorite things - use distraction techniques - use relaxation during pain  - review written information mailed to me on chronic pain   Why is this important?   Stress makes chronic pain feel worse.  Feelings like depression, anxiety, stress and anger can make your body more sensitive to pain.  Learning ways to cope with stress or depression may help you find some relief from the pain.   02/01/23:  Patient doing well today-has started PT and is helpful   Patient verbalizes understanding of instructions and care plan provided today and agrees to view in MyChart. Active MyChart status and patient understanding of how to access instructions and care plan via MyChart confirmed with patient.     The Managed Medicaid care management team will reach out to the patient again over the next 30 business  days.  The  Patient  has been provided with contact information for the Managed Medicaid care management team and has been advised to call with any health related questions or concerns.   Kathi Der RN, BSN [place]  [facility] Care Management Coordinator - Managed Medicaid High Risk [phone]   Following is a copy of your plan of care:  Care Plan : RN Care Manager Plan Of Care  Updates made by Nikolus Marczak, Calvert Cantor, RN since  02/01/2023 12:00 AM     Problem: Chronic Disease Management and Care Coordination Needs      Long-Range Goal: Development of Plan Of Care For Chronic Disease Management and Care Coordination Needs To Assist With Meeting Treatment Goals   Start Date: 04/15/2021  Expected End Date: 03/18/2023  Priority: High  Note:   Current Barriers:  Care Coordination needs related eye and dental resources Chronic Disease Management support and education needs related to weight concerns, depression/anxiety, chronic pain, DDD, osteoarthritis 46/10/24:  Patient currently seeing nutritionist and PT.  Patient to follow up with PCP regarding headaches, has  appt 6/18.  RNCM Clinical Goal(s): Patient will verbalize basic understanding of weight management and depression disease process and self health management plan .  take all medications exactly as prescribed and will call provider for medication related questions attend all scheduled medical appointments: with primary care and specialist demonstrate ongoing adherence to prescribed treatment plan for  weight management and depression as evidenced by exercise 5-7 days/week adherence to prescribed medication regimen contacting provider for new or worsened symptoms or questions . continue to work with RN Care Manager to address care management and care coordination needs related to weight management and depression work with Child psychotherapist to address depression through collaboration with Medical illustrator, provider, and care tem.   Interventions Inter-disciplinary  care team collaboration (see longitudinal plan of care) Evaluation of current treatment plan related to  self management and patient's adherence to plan as established by provider Collaborated with LCSW LCSW referral for depression-completed Collaborated with BSW BSW referral for eye and dental providers at patient request-completed PCP sent message for nutrition referral-completed  Weight Loss:  (Status: Goal on track: NO.) Provided patient with contact information for managed Medicaid health plan customer service phone number about weight management benefits  05/13/21- Reviewed weight management benefits offered by patient's health plan and again encouraged her to call customer service for details  PreDiabetes:  (Status: Goal on track: NO.) Lab Results  Component Value Date   HGBA1C 5.7 (H) 12/06/2019  Assessed patient's understanding of A1c goal:  <5.7% Provided education to patient about basic prediabetes disease process; Provided written information on prediabetes eating plan Counseled on importance of regular laboratory  monitoring as prescribed;        Discussed plans with patient for ongoing care management follow up and provided patient with direct contact information for care management team;       Pain:  (Status: New goal. Goal on track: YES.) Pain assessment performed Medications reviewed Reviewed provider established plan for pain management; Discussed importance of adherence to all scheduled medical appointments; Counseled on the importance of reporting any/all new or changed pain symptoms or management strategies to pain management provider; Reviewed with patient prescribed pharmacological and nonpharmacological pain relief strategies; Provided written health plan information related to alternative healing benefits such as  massage and acupuncture 12/17/22, patient to start PT  Patient Goals/Self-Care Activities: Patient will self administer medications as prescribed  Patient will attend all scheduled provider appointments  Patient will review educational material mailed to her on 04/15/21 Patient will continue to perform ADL's independently Patient will continue to perform IADL's independently Patient will call provider office for new concerns or questions Patient will follow up with provider regarding medication and weight management

## 2023-02-03 ENCOUNTER — Ambulatory Visit: Payer: Medicaid Other | Admitting: Dietician

## 2023-02-09 ENCOUNTER — Ambulatory Visit (INDEPENDENT_AMBULATORY_CARE_PROVIDER_SITE_OTHER): Payer: Medicaid Other | Admitting: Family Medicine

## 2023-02-09 VITALS — BP 130/87 | HR 77 | Temp 97.7°F | Resp 16 | Wt 209.6 lb

## 2023-02-09 DIAGNOSIS — Z6835 Body mass index (BMI) 35.0-35.9, adult: Secondary | ICD-10-CM | POA: Diagnosis not present

## 2023-02-09 DIAGNOSIS — E6609 Other obesity due to excess calories: Secondary | ICD-10-CM

## 2023-02-09 DIAGNOSIS — M797 Fibromyalgia: Secondary | ICD-10-CM | POA: Diagnosis not present

## 2023-02-09 DIAGNOSIS — F32A Depression, unspecified: Secondary | ICD-10-CM

## 2023-02-09 MED ORDER — DULOXETINE HCL 30 MG PO CPEP: 30.0000 mg | ORAL_CAPSULE | Freq: Every day | ORAL | 3 refills | Status: DC

## 2023-02-09 NOTE — Progress Notes (Unsigned)
[facility] Patient Office Visit  Subjective    Patient ID: Hannah Fleming, female    DOB: 1981/06/22  Age: 42 y.o. MRN: [id number]  CC:  Chief Complaint  Patient presents with   Follow-up    HPI Hannah Fleming presents to establish care   Outpatient Encounter Medications as of 02/09/2023  Medication Sig   DULoxetine (CYMBALTA) 20 MG capsule TAKE 1 CAPSULE BY MOUTH EVERY DAY   ibuprofen (ADVIL) 600 MG tablet Take 1 tablet (600 mg total) by mouth every 6 (six) hours as needed.   methocarbamol (ROBAXIN) 500 MG tablet Take 1 tablet (500 mg total) by mouth daily as needed for muscle spasms.   acetaminophen (TYLENOL) 500 MG tablet Take 1,000 mg by mouth every 6 (six) hours as needed for mild pain or moderate pain. (Patient not taking: Reported on 12/03/2022)   metroNIDAZOLE (FLAGYL) 500 MG tablet Take 1 tablet (500 mg total) by mouth 2 (two) times daily. (Patient not taking: Reported on 08/04/2022)   metroNIDAZOLE 1.3 % GEL Place 1 Applicatorful vaginally 2 (two) times daily. (Patient not taking: Reported on 09/08/2022)   mirtazapine (REMERON) 15 MG tablet TAKE 1 TABLET BY MOUTH EVERYDAY AT BEDTIME (Patient not taking: Reported on 12/03/2022)   mometasone (ELOCON) 0.1 % lotion  (Patient not taking: Reported on 12/03/2022)   Norethindrone Acetate-Ethinyl Estrad-FE (LOESTRIN 24 FE) 1-20 MG-MCG(24) tablet Take 1 tablet by mouth daily. (Patient not taking: Reported on 12/03/2022)   oxyCODONE-acetaminophen (PERCOCET/ROXICET) 5-325 MG tablet Take 1 tablet by mouth every 6 (six) hours as needed. (Patient not taking: Reported on 08/03/2022)   Prenat-Fe Carbonyl-FA-Omega 3 (ONE-A-DAY WOMENS PRENATAL 1) 28-0.8-235 MG CAPS Take 1 capsule by mouth daily. (Patient not taking: Reported on 12/03/2022)   promethazine (PHENERGAN) 25 MG tablet Take 1 tablet (25 mg total) by mouth every 6 (six) hours as needed for nausea or vomiting. (Patient not taking: Reported on 02/01/2023)   No facility-administered  encounter medications on file as of 02/09/2023.    Past Medical History:  Diagnosis Date   Arthritis    RA   Fibroids    Genital herpes    no outbreak in 66yrs   Trichomonas infection    Urticaria     Past Surgical History:  Procedure Laterality Date   DILATATION & CURETTAGE/HYSTEROSCOPY WITH MYOSURE N/A 10/22/2021   Procedure: DILATATION & CURETTAGE/HYSTEROSCOPY WITH MYOSURE;  Surgeon: [name], MD;  Location: [facility];  Service: Gynecology;  Laterality: N/A;   DILATION AND CURETTAGE OF UTERUS  2020   DILATION AND EVACUATION N/A 07/09/2022   Procedure: DILATATION AND EVACUATION;  Surgeon: Catalina Antigua, MD;  Location: [facility] OR;  Service: Gynecology;  Laterality: N/A;   INDUCED ABORTION     WISDOM TOOTH EXTRACTION      Family History  Problem Relation Age of Onset   Healthy Mother    Sickle cell trait Mother    Healthy Father    Sickle cell trait Sister    Sickle cell trait Brother    Healthy Daughter    Healthy Son    Healthy Son    Other Neg Hx     Social History   Socioeconomic History   Marital status: Single    Spouse name: Not on file   Number of children: 3   Years of education: Not on file   Highest education level: Not on file  Occupational History   Not on file  Tobacco Use   Smoking status: Former  Packs/day: 1.00    Years: 4.00    Additional pack years: 0.00    Total pack years: 4.00    Types: Cigarettes    Quit date: 02/07/2009    Years since quitting: 14.0    Passive exposure: Never   Smokeless tobacco: Never  Vaping Use   Vaping Use: Never used  Substance and Sexual Activity   Alcohol use: Yes    Comment: OCC   Drug use: No   Sexual activity: Yes    Partners: Male    Birth control/protection: None    Comment: approx [redacted] wks gestation per patient  Other Topics Concern   Not on file  Social History Narrative   Not on file   Social Determinants of Health   Financial Resource Strain: Low Risk  (02/01/2023)    Overall Financial Resource Strain (CARDIA)    Difficulty of Paying Living Expenses: Not very hard  Food Insecurity: No Food Insecurity (11/17/2022)   Hunger Vital Sign    Worried About Running Out of Food in the Last Year: Never true    Ran Out of Food in the Last Year: Never true  Transportation Needs: No Transportation Needs (11/11/2022)   PRAPARE - Administrator, Civil Service (Medical): No    Lack of Transportation (Non-Medical): No  Physical Activity: Sufficiently Active (10/12/2022)   Exercise Vital Sign    Days of Exercise per Week: 7 days    Minutes of Exercise per Session: 30 min  Stress: No Stress Concern Present (02/01/2023)   [employer] of Occupational Health - Occupational Stress Questionnaire    Feeling of Stress : Only a little  Social Connections: Socially Integrated (10/12/2022)   Social Connection and Isolation Panel [NHANES]    Frequency of Communication with Friends and Family: More than three times a week    Frequency of Social Gatherings with Friends and Family: More than three times a week    Attends Religious Services: 1 to 4 times per year    Active Member of [employer] or Organizations: Yes    Attends Banker Meetings: 1 to 4 times per year    Marital Status: Living with partner  Intimate Partner Violence: Not At Risk (11/17/2022)   Humiliation, Afraid, Rape, and Kick questionnaire    Fear of Current or Ex-Partner: No    Emotionally Abused: No    Physically Abused: No    Sexually Abused: No    Review of Systems  Musculoskeletal:  Positive for myalgias.  All other systems reviewed and are negative.       Objective    BP 130/87   Pulse 77   Temp 97.7 F (36.5 C) (Oral)   Resp 16   Wt 209 lb 9.6 oz (95.1 kg)   LMP 03/24/2022 (Approximate)   SpO2 97%   BMI 35.98 kg/m   Physical Exam  {Labs (Optional):23779}    Assessment & Plan:   Problem List Items Addressed This Visit   None Visit Diagnoses      Fibromyalgia    -  Primary       No follow-ups on file.   Tommie Raymond, MD

## 2023-02-10 ENCOUNTER — Telehealth: Payer: Self-pay | Admitting: Physical Therapy

## 2023-02-10 ENCOUNTER — Ambulatory Visit: Payer: Medicaid Other | Admitting: Physical Therapy

## 2023-02-10 NOTE — Telephone Encounter (Signed)
Called pt re: this afternoon's missed appt - pt answers and states she is doing well, had to work today. Advised on attendance policy and confirmed date/time of next appt

## 2023-02-11 ENCOUNTER — Encounter: Payer: Self-pay | Admitting: Family Medicine
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
## 2023-02-16 ENCOUNTER — Ambulatory Visit: Payer: Medicaid Other | Admitting: Physical Therapy

## 2023-02-16 ENCOUNTER — Encounter: Payer: Self-pay | Admitting: Physical Therapy

## 2023-02-16 DIAGNOSIS — M5459 Other low back pain: Secondary | ICD-10-CM

## 2023-02-16 DIAGNOSIS — M6281 Muscle weakness (generalized): Secondary | ICD-10-CM

## 2023-02-18 ENCOUNTER — Telehealth: Payer: Self-pay
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
## 2023-02-24 ENCOUNTER — Ambulatory Visit: Payer: Medicaid Other | Admitting: Physical Therapy

## 2023-03-03 ENCOUNTER — Encounter: Payer: Self-pay | Admitting: Obstetrics and Gynecology

## 2023-03-03 ENCOUNTER — Other Ambulatory Visit: Payer: Medicaid Other | Admitting: Obstetrics and Gynecology

## 2023-03-03 NOTE — Patient Instructions (Signed)
[name], thank you for the updates-have a great day!  Hannah Fleming was given information about Medicaid Managed Care team care coordination services as a part of their [employer] Medicaid benefit. Hannah Fleming verbally consented to engagement with the [facility] Managed Care team.   If you are experiencing a medical emergency, please call 911 or report to your local emergency department or urgent care.   If you have a non-emergency medical problem during routine business hours, please contact your provider's office and ask to speak with a nurse.   For questions related to your [facility], please call: [phone] or visit the homepage here: [email or web address]  If you would like to schedule transportation through your [facility], please call the following number at least 2 days in advance of your appointment: [phone]   Rides for urgent appointments can also be made after hours by calling Member Services.  Call the Behavioral Health Crisis Line at [phone], at any time, 24 hours a day, 7 days a week. If you are in danger or need immediate medical attention call 911.  If you would like help to quit smoking, call 1-800-QUIT-NOW (781-169-3330) OR Espaol: 1-855-Djelo-Ya (1-324-401-0272) o para ms informacin haga clic aqu or Text READY to 536-644 to register via text  [name] - following are the goals we discussed in your visit today:   Goals Addressed             This Visit's Progress    Cope with Chronic Pain       Timeframe:  Long-Range Goal Priority:  Medium Start Date:     04/15/21                        Expected End Date:    ongoing                   Follow Up Date 05/04/23   - learn relaxation techniques - practice acceptance of chronic pain - practice relaxation or meditation daily - spend time with positive people - think of  new ways to do favorite things - use distraction techniques - use relaxation during pain  - review written information mailed to me on chronic pain   Why is this important?   Stress makes chronic pain feel worse.  Feelings like depression, anxiety, stress and anger can make your body more sensitive to pain.  Learning ways to cope with stress or depression may help you find some relief from the pain.   03/03/23:  PT continues, PCP 7/18, Nutrition 8/5   Patient verbalizes understanding of instructions and care plan provided today and agrees to view in MyChart. Active MyChart status and patient understanding of how to access instructions and care plan via MyChart confirmed with patient.     The Managed Medicaid care management team will reach out to the patient again over the next 60 business  days.  The  Patient has been provided with contact information for the Managed Medicaid care management team and has been advised to call with any health related questions or concerns.   Kathi Der RN, BSN [place]  [facility] Care Management Coordinator - Managed Medicaid High Risk [phone]   Following is a copy of your plan of care:  Care Plan : RN Care Manager Plan Of Care  Updates made by Danie Chandler, RN since 03/03/2023 12:00 AM  Problem: Chronic Disease Management and Care Coordination Needs      Long-Range Goal: Development of Plan Of Care For Chronic Disease Management and Care Coordination Needs To Assist With Meeting Treatment Goals   Start Date: 04/15/2021  Expected End Date: 06/03/2023  Priority: High  Note:   Current Barriers:  Care Coordination needs related eye and dental resources Chronic Disease Management support and education needs related to weight concerns, depression/anxiety, chronic pain, DDD, osteoarthritis 03/03/23:  No headaches.  Continues PT sessions and has Nutrition appt 8/5.  Cymbalta 20mg  working better for patient  RNCM Clinical  Goal(s): Patient will verbalize basic understanding of weight management and depression disease process and self health management plan .  take all medications exactly as prescribed and will call provider for medication related questions attend all scheduled medical appointments: with primary care and specialist demonstrate ongoing adherence to prescribed treatment plan for  weight management and depression as evidenced by exercise 5-7 days/week adherence to prescribed medication regimen contacting provider for new or worsened symptoms or questions . continue to work with RN Care Manager to address care management and care coordination needs related to weight management and depression work with Child psychotherapist to address depression through collaboration with Medical illustrator, provider, and care tem.   Interventions Inter-disciplinary  care team collaboration (see longitudinal plan of care) Evaluation of current treatment plan related to  self management and patient's adherence to plan as established by provider Collaborated with LCSW LCSW referral for depression-completed Collaborated with BSW BSW referral for eye and dental providers at patient request-completed PCP sent message for nutrition referral-completed  Weight Loss:  (Status: Goal on track: NO.) Provided patient with contact information for managed Medicaid health plan customer service phone number about weight management benefits  05/13/21- Reviewed weight management benefits offered by patient's health plan and again encouraged her to call customer service for details  PreDiabetes:  (Status: Goal on track: NO.) Lab Results  Component Value Date   HGBA1C 5.7 (H) 12/06/2019  Assessed patient's understanding of A1c goal:  <5.7% Provided education to patient about basic prediabetes disease process; Provided written information on prediabetes eating plan Counseled on importance of regular laboratory monitoring as prescribed;         Discussed plans with patient for ongoing care management follow up and provided patient with direct contact information for care management team;       Pain:  (Status: New goal. Goal on track: YES.) Pain assessment performed Medications reviewed Reviewed provider established plan for pain management; Discussed importance of adherence to all scheduled medical appointments; Counseled on the importance of reporting any/all new or changed pain symptoms or management strategies to pain management provider; Reviewed with patient prescribed pharmacological and nonpharmacological pain relief strategies; Provided written health plan information related to alternative healing benefits such as  massage and acupuncture 12/17/22, patient to start PT  Patient Goals/Self-Care Activities: Patient will self administer medications as prescribed  Patient will attend all scheduled provider appointments  Patient will review educational material mailed to her on 04/15/21 Patient will continue to perform ADL's independently Patient will continue to perform IADL's independently Patient will call provider office for new concerns or questions Patient will follow up with provider regarding medication and weight management

## 2023-03-03 NOTE — Patient Outreach (Signed)
Medicaid Managed Care   Nurse Care Manager Note  03/03/2023 Name:  Hannah Fleming MRN:  [id number] DOB:  1980/11/19  Hannah Fleming is an 42 y.o. year old female who is a primary patient of [name], MD.  The [facility] Managed Care Coordination team was consulted for assistance with:    Chronic healthcare management needs, anxiety/depression, chronic pain, osteoarthritis, fibromyalgia, positive RNP, LBP, myofascial pain syndrome  [name] was given information about Medicaid Managed Care Coordination team services today. [name] Patient agreed to services and verbal consent obtained.  Engaged with patient by telephone for follow up visit in response to provider referral for case management and/or care coordination services.   Assessments/Interventions:  Review of past medical history, allergies, medications, health status, including review of consultants reports, laboratory and other test data, was performed as part of comprehensive evaluation and provision of chronic care management services.  SDOH (Social Determinants of Health) assessments and interventions performed: SDOH Interventions    Flowsheet Row Patient Outreach Telephone from 03/03/2023 in [place] POPULATION HEALTH DEPARTMENT Patient Outreach Telephone from 02/01/2023 in [place] POPULATION HEALTH DEPARTMENT Patient Outreach Telephone from 12/17/2022 in [place] POPULATION HEALTH DEPARTMENT Patient Outreach Telephone from 11/17/2022 in [place] POPULATION HEALTH DEPARTMENT Patient Outreach Telephone from 11/11/2022 in [place] POPULATION HEALTH DEPARTMENT Patient Outreach Telephone from 10/12/2022 in [place] POPULATION HEALTH DEPARTMENT  SDOH Interventions        Food Insecurity Interventions -- -- -- Intervention Not Indicated -- --  Housing Interventions -- -- -- -- Intervention Not Indicated --  Transportation Interventions -- -- -- -- Intervention Not Indicated --  Utilities Interventions --  -- Intervention Not Indicated -- -- --  Alcohol Usage Interventions -- -- Intervention Not Indicated (Score <7) -- -- --  Financial Strain Interventions -- Intervention Not Indicated -- -- -- --  Physical Activity Interventions Intervention Not Indicated -- -- -- -- Intervention Not Indicated  Stress Interventions -- Intervention Not Indicated -- -- -- --  Social Connections Interventions Intervention Not Indicated -- -- -- -- Intervention Not Indicated     Care Plan  Allergies  Allergen Reactions   Prilosec Otc [Omeprazole Magnesium] Hives   Latex Itching and Rash    Medications Reviewed Today     Reviewed by Danie Chandler, RN (Registered Nurse) on 03/03/23 at [phone]  Med List Status: <None>   Medication Order Taking? Sig Documenting Provider Last Dose Status Informant  acetaminophen (TYLENOL) 500 MG tablet [id number]  Take 1,000 mg by mouth every 6 (six) hours as needed for mild pain or moderate pain.  Patient not taking: Reported on 12/03/2022   [provider]  Active Self  DULoxetine (CYMBALTA) 20 MG capsule [id number]  TAKE 1 CAPSULE BY MOUTH EVERY DAY [name], NP  Active   DULoxetine (CYMBALTA) 30 MG capsule [id number] No Take 1 capsule (30 mg total) by mouth daily.  Patient not taking: Reported on 03/03/2023   [name], MD Not Taking Active   ibuprofen (ADVIL) 600 MG tablet [id number]  Take 1 tablet (600 mg total) by mouth every 6 (six) hours as needed. Constant, Peggy, MD  Active   methocarbamol (ROBAXIN) 500 MG tablet [id number]  Take 1 tablet (500 mg total) by mouth daily as needed for muscle spasms. Gearldine Bienenstock, PA-C  Active   metroNIDAZOLE (FLAGYL) 500 MG tablet [id number]  Take 1 tablet (500 mg total) by mouth 2 (two) times daily.  Patient not taking: Reported on  08/04/2022   Constant, Peggy, MD  Active   metroNIDAZOLE 1.3 % GEL [id number]  Place 1 Applicatorful vaginally 2 (two) times daily.  Patient not taking: Reported on 09/08/2022   [name],  MD  Active   mirtazapine (REMERON) 15 MG tablet [id number]  TAKE 1 TABLET BY MOUTH EVERYDAY AT BEDTIME  Patient not taking: Reported on 12/03/2022   [name], MD  Active   mometasone (ELOCON) 0.1 % lotion [id number]    Patient not taking: Reported on 12/03/2022   [provider]  Active   Norethindrone Acetate-Ethinyl Estrad-FE (LOESTRIN 24 FE) 1-20 MG-MCG(24) tablet [id number]  Take 1 tablet by mouth daily.  Patient not taking: Reported on 12/03/2022   Constant, Peggy, MD  Active   oxyCODONE-acetaminophen (PERCOCET/ROXICET) 5-325 MG tablet [id number]  Take 1 tablet by mouth every 6 (six) hours as needed.  Patient not taking: Reported on 08/03/2022   Constant, Peggy, MD  Active   Prenat-Fe Carbonyl-FA-Omega 3 (ONE-A-DAY WOMENS PRENATAL 1) 28-0.8-235 MG CAPS [id number]  Take 1 capsule by mouth daily.  Patient not taking: Reported on 12/03/2022   [provider]  Active Self  promethazine (PHENERGAN) 25 MG tablet [id number]  Take 1 tablet (25 mg total) by mouth every 6 (six) hours as needed for nausea or vomiting.  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   Bacterial vaginitis 11/27/2020   Candida vaginitis 11/27/2020   Prediabetes 06/21/2019   Ear itch 03/30/2019   Tonsillar calculus 03/30/2019   Menorrhagia with regular cycle 12/28/2013   Anxiety 02/07/2013   Dysmenorrhea 02/07/2013   Conditions to be addressed/monitored per PCP order:  Chronic healthcare management needs, anxiety/depression, chronic pain, osteoarthritis, fibromyalgia, positive RNP, LBP, myofascial pain syndrome  Care Plan : RN Care Manager Plan Of Care  Updates made by Danie Chandler, RN since 03/03/2023 12:00 AM     Problem: Chronic Disease  Management and Care Coordination Needs      Long-Range Goal: Development of Plan Of Care For Chronic Disease Management and Care Coordination Needs To Assist With Meeting Treatment Goals   Start Date: 04/15/2021  Expected End Date: 06/03/2023  Priority: High  Note:   Current Barriers:  Care Coordination needs related eye and dental resources Chronic Disease Management support and education needs related to weight concerns, depression/anxiety, chronic pain, DDD, osteoarthritis 03/03/23:  No headaches.  Continues PT sessions and has Nutrition appt 8/5.  Cymbalta 20mg  working better for patient  RNCM Clinical Goal(s): Patient will verbalize basic understanding of weight management and depression disease process and self health management plan .  take all medications exactly as prescribed and will call provider for medication related questions attend all scheduled medical appointments: with primary care and specialist demonstrate ongoing adherence to prescribed treatment plan for  weight management and depression as evidenced by exercise 5-7 days/week adherence to prescribed medication regimen contacting provider for new or worsened symptoms or questions . continue to work with RN Care Manager to address care management and care coordination needs related to weight management and depression work with Child psychotherapist to address depression through collaboration with Medical illustrator, provider, and care tem.   Interventions Inter-disciplinary  care team collaboration (see longitudinal  plan of care) Evaluation of current treatment plan related to  self management and patient's adherence to plan as established by provider Collaborated with LCSW LCSW referral for depression-completed Collaborated with BSW BSW referral for eye and dental providers at patient request-completed PCP sent message for nutrition referral-completed  Weight Loss:  (Status: Goal on track: NO.) Provided patient with contact  information for managed Medicaid health plan customer service phone number about weight management benefits  05/13/21- Reviewed weight management benefits offered by patient's health plan and again encouraged her to call customer service for details  PreDiabetes:  (Status: Goal on track: NO.) Lab Results  Component Value Date   HGBA1C 5.7 (H) 12/06/2019  Assessed patient's understanding of A1c goal:  <5.7% Provided education to patient about basic prediabetes disease process; Provided written information on prediabetes eating plan Counseled on importance of regular laboratory monitoring as prescribed;        Discussed plans with patient for ongoing care management follow up and provided patient with direct contact information for care management team;       Pain:  (Status: New goal. Goal on track: YES.) Pain assessment performed Medications reviewed Reviewed provider established plan for pain management; Discussed importance of adherence to all scheduled medical appointments; Counseled on the importance of reporting any/all new or changed pain symptoms or management strategies to pain management provider; Reviewed with patient prescribed pharmacological and nonpharmacological pain relief strategies; Provided written health plan information related to alternative healing benefits such as  massage and acupuncture 12/17/22, patient to start PT  Patient Goals/Self-Care Activities: Patient will self administer medications as prescribed  Patient will attend all scheduled provider appointments  Patient will review educational material mailed to her on 04/15/21 Patient will continue to perform ADL's independently Patient will continue to perform IADL's independently Patient will call provider office for new concerns or questions Patient will follow up with provider regarding medication and weight management    Follow Up:  Patient agrees to Care Plan and Follow-up.  Plan: The Managed Medicaid  care management team will reach out to the patient again over the next 60 business  days. and The  Patient has been provided with contact information for the Managed Medicaid care management team and has been advised to call with any health related questions or concerns.  Date/time of next scheduled RN care management/care coordination outreach: 05/04/23 at 0900

## 2023-03-09 ENCOUNTER — Ambulatory Visit: Payer: Medicaid Other | Attending: Rheumatology | Admitting: Rheumatology

## 2023-03-09 ENCOUNTER — Ambulatory Visit: Payer: Medicaid Other

## 2023-03-09 ENCOUNTER — Encounter: Payer: Self-pay | Admitting: Rheumatology

## 2023-03-09 VITALS — BP 119/83 | HR 86 | Resp 17 | Ht 64.0 in | Wt 208.4 lb

## 2023-03-09 DIAGNOSIS — M7062 Trochanteric bursitis, left hip: Secondary | ICD-10-CM

## 2023-03-09 DIAGNOSIS — M19071 Primary osteoarthritis, right ankle and foot: Secondary | ICD-10-CM | POA: Diagnosis not present

## 2023-03-09 DIAGNOSIS — M5442 Lumbago with sciatica, left side: Secondary | ICD-10-CM | POA: Diagnosis not present

## 2023-03-09 DIAGNOSIS — N946 Dysmenorrhea, unspecified: Secondary | ICD-10-CM | POA: Diagnosis not present

## 2023-03-09 DIAGNOSIS — R768 Other specified abnormal immunological findings in serum: Secondary | ICD-10-CM | POA: Diagnosis not present

## 2023-03-09 DIAGNOSIS — N96 Recurrent pregnancy loss: Secondary | ICD-10-CM

## 2023-03-09 DIAGNOSIS — M7061 Trochanteric bursitis, right hip: Secondary | ICD-10-CM | POA: Diagnosis not present

## 2023-03-09 DIAGNOSIS — G8929 Other chronic pain: Secondary | ICD-10-CM | POA: Diagnosis not present

## 2023-03-09 DIAGNOSIS — M62838 Other muscle spasm: Secondary | ICD-10-CM

## 2023-03-09 DIAGNOSIS — M5441 Lumbago with sciatica, right side: Secondary | ICD-10-CM

## 2023-03-09 DIAGNOSIS — M47816 Spondylosis without myelopathy or radiculopathy, lumbar region: Secondary | ICD-10-CM

## 2023-03-09 DIAGNOSIS — R7303 Prediabetes: Secondary | ICD-10-CM

## 2023-03-09 DIAGNOSIS — M797 Fibromyalgia: Secondary | ICD-10-CM

## 2023-03-09 DIAGNOSIS — M170 Bilateral primary osteoarthritis of knee: Secondary | ICD-10-CM | POA: Diagnosis not present

## 2023-03-09 DIAGNOSIS — M19041 Primary osteoarthritis, right hand: Secondary | ICD-10-CM

## 2023-03-09 DIAGNOSIS — M19072 Primary osteoarthritis, left ankle and foot: Secondary | ICD-10-CM

## 2023-03-09 DIAGNOSIS — M4126 Other idiopathic scoliosis, lumbar region: Secondary | ICD-10-CM

## 2023-03-09 DIAGNOSIS — F419 Anxiety disorder, unspecified: Secondary | ICD-10-CM

## 2023-03-09 DIAGNOSIS — M19042 Primary osteoarthritis, left hand: Secondary | ICD-10-CM

## 2023-03-09 NOTE — Patient Instructions (Signed)
Standing Labs We placed an order today for your standing lab work.   Please have your standing labs drawn in January  Please have your labs drawn 2 weeks prior to your appointment so that the provider can discuss your lab results at your appointment, if possible.  Please note that you may see your imaging and lab results in MyChart before we have reviewed them. We will contact you once all results are reviewed. Please allow our office up to 72 hours to thoroughly review all of the results before contacting the office for clarification of your results.  WALK-IN LAB HOURS  Monday through Thursday from 8:00 am -12:30 pm and 1:00 pm-5:00 pm and Friday from 8:00 am-12:00 pm.  Patients with office visits requiring labs will be seen before walk-in labs.  You may encounter longer than normal wait times. Please allow additional time. Wait times may be shorter on  Monday and Thursday afternoons.  We do not book appointments for walk-in labs. We appreciate your patience and understanding with our staff.   Labs are drawn by [facility]. Please bring your co-pay at the time of your lab draw.  You may receive a bill from [facility] for your lab work.  Please note if you are on Hydroxychloroquine and and an order has been placed for a Hydroxychloroquine level,  you will need to have it drawn 4 hours or more after your last dose.  If you wish to have your labs drawn at another location, please call the office 24 hours in advance so we can fax the orders.  The office is located at [address], [place], [place] [address]   If you have any questions regarding directions or hours of operation,  please call [phone].   As a reminder, please drink plenty of water prior to coming for your lab work. Thanks!

## 2023-03-11 ENCOUNTER — Ambulatory Visit: Payer: Medicaid Other | Admitting: Family Medicine

## 2023-03-29 ENCOUNTER — Ambulatory Visit: Payer: Medicaid Other | Admitting: Dietician

## 2023-04-06 ENCOUNTER — Emergency Department (HOSPITAL_BASED_OUTPATIENT_CLINIC_OR_DEPARTMENT_OTHER)
Admission: EM | Admit: 2023-04-06 | Discharge: 2023-04-06 | Disposition: A | Discharge status: Home / Self Care | Payer: Medicaid Other

## 2023-04-06 ENCOUNTER — Other Ambulatory Visit: Payer: Self-pay

## 2023-04-06 ENCOUNTER — Emergency Department (HOSPITAL_BASED_OUTPATIENT_CLINIC_OR_DEPARTMENT_OTHER): Payer: Medicaid Other

## 2023-04-06 ENCOUNTER — Encounter (HOSPITAL_BASED_OUTPATIENT_CLINIC_OR_DEPARTMENT_OTHER): Payer: Self-pay | Admitting: Emergency Medicine

## 2023-04-06 ENCOUNTER — Other Ambulatory Visit (HOSPITAL_BASED_OUTPATIENT_CLINIC_OR_DEPARTMENT_OTHER): Payer: Self-pay

## 2023-04-06 ENCOUNTER — Emergency Department (HOSPITAL_BASED_OUTPATIENT_CLINIC_OR_DEPARTMENT_OTHER): Payer: Medicaid Other | Admitting: Radiology

## 2023-04-06 DIAGNOSIS — B3731 Acute candidiasis of vulva and vagina: Secondary | ICD-10-CM | POA: Diagnosis not present

## 2023-04-06 DIAGNOSIS — U071 COVID-19: Secondary | ICD-10-CM | POA: Diagnosis not present

## 2023-04-06 DIAGNOSIS — M791 Myalgia, unspecified site: Secondary | ICD-10-CM

## 2023-04-06 DIAGNOSIS — Z91040 Latex allergy status: Secondary | ICD-10-CM | POA: Diagnosis not present

## 2023-04-06 DIAGNOSIS — R0602 Shortness of breath: Secondary | ICD-10-CM | POA: Diagnosis present

## 2023-04-06 DIAGNOSIS — R0981 Nasal congestion: Secondary | ICD-10-CM

## 2023-04-06 DIAGNOSIS — N898 Other specified noninflammatory disorders of vagina: Secondary | ICD-10-CM

## 2023-04-06 DIAGNOSIS — J029 Acute pharyngitis, unspecified: Secondary | ICD-10-CM

## 2023-04-06 DIAGNOSIS — R079 Chest pain, unspecified: Secondary | ICD-10-CM | POA: Diagnosis not present

## 2023-04-06 LAB — RESP PANEL BY RT-PCR (RSV, FLU A&B, COVID)  RVPGX2
Influenza A by PCR: NEGATIVE
Influenza B by PCR: NEGATIVE
Resp Syncytial Virus by PCR: NEGATIVE
SARS Coronavirus 2 by RT PCR: POSITIVE — AB

## 2023-04-06 LAB — CBC
HCT: 39.1 % (ref 36.0–46.0)
Hemoglobin: 12.9 g/dL (ref 12.0–15.0)
MCH: 29.2 pg (ref 26.0–34.0)
MCHC: 33 g/dL (ref 30.0–36.0)
MCV: 88.5 fL (ref 80.0–100.0)
Platelets: 272 10*3/uL (ref 150–400)
RBC: 4.42 MIL/uL (ref 3.87–5.11)
RDW: 13.9 % (ref 11.5–15.5)
WBC: 8.1 10*3/uL (ref 4.0–10.5)
nRBC: 0 % (ref 0.0–0.2)

## 2023-04-06 LAB — URINALYSIS, ROUTINE W REFLEX MICROSCOPIC
Bacteria, UA: NONE SEEN
Bilirubin Urine: NEGATIVE
Glucose, UA: NEGATIVE mg/dL
Ketones, ur: NEGATIVE mg/dL
Nitrite: NEGATIVE
Protein, ur: NEGATIVE mg/dL
Specific Gravity, Urine: 1.021 (ref 1.005–1.030)
pH: 6.5 (ref 5.0–8.0)

## 2023-04-06 LAB — WET PREP, GENITAL
Clue Cells Wet Prep HPF POC: NONE SEEN
Sperm: NONE SEEN
Trich, Wet Prep: NONE SEEN
WBC, Wet Prep HPF POC: >=10 — AB (ref <10)

## 2023-04-06 LAB — BASIC METABOLIC PANEL
Anion gap: 6 (ref 5–15)
BUN: 13 mg/dL (ref 6–20)
CO2: 26 mmol/L (ref 22–32)
Calcium: 9.1 mg/dL (ref 8.9–10.3)
Chloride: 105 mmol/L (ref 98–111)
Creatinine, Ser: 0.89 mg/dL (ref 0.44–1.00)
GFR, Estimated: >60 mL/min (ref >60)
Glucose, Bld: 106 mg/dL — ABNORMAL HIGH (ref 70–99)
Potassium: 3.7 mmol/L (ref 3.5–5.1)
Sodium: 137 mmol/L (ref 135–145)

## 2023-04-06 LAB — PREGNANCY, URINE: Preg Test, Ur: NEGATIVE

## 2023-04-06 LAB — TROPONIN I (HIGH SENSITIVITY): Troponin I (High Sensitivity): <2 ng/L (ref <18)

## 2023-04-06 MED ORDER — SODIUM CHLORIDE 0.9 % IV BOLUS
1000.0000 mL | Freq: Once | INTRAVENOUS | Status: AC
  Administered 2023-04-06: 1000 mL via INTRAVENOUS

## 2023-04-06 MED ORDER — KETOROLAC TROMETHAMINE 15 MG/ML IJ SOLN
15.0000 mg | Freq: Once | INTRAMUSCULAR | Status: AC
  Administered 2023-04-06: 15 mg via INTRAVENOUS
  Filled 2023-04-06: qty 1

## 2023-04-06 MED ORDER — IBUPROFEN 600 MG PO TABS
600.0000 mg | ORAL_TABLET | Freq: Four times a day (QID) | ORAL | 0 refills | Status: DC | PRN
  Filled 2023-04-06: qty 30, 8d supply, fill #0

## 2023-04-06 MED ORDER — BENZONATATE 100 MG PO CAPS
100.0000 mg | ORAL_CAPSULE | Freq: Three times a day (TID) | ORAL | 0 refills | Status: DC
  Filled 2023-04-06: qty 21, 7d supply, fill #0

## 2023-04-06 MED ORDER — PAXLOVID (300/100) 20 X 150 MG & 10 X 100MG PO TBPK
3.0000 | ORAL_TABLET | Freq: Two times a day (BID) | ORAL | 0 refills | Status: AC
Start: 2023-04-06 — End: 2023-04-11
  Filled 2023-04-06: qty 30, 5d supply, fill #0

## 2023-04-06 MED ORDER — FLUCONAZOLE 150 MG PO TABS
150.0000 mg | ORAL_TABLET | Freq: Once | ORAL | 0 refills | Status: AC
  Filled 2023-04-06: qty 2, 2d supply, fill #0

## 2023-04-06 NOTE — ED Notes (Signed)
Discharge paperwork given and verbally understood. 

## 2023-04-06 NOTE — ED Notes (Signed)
Pt aware of the need for a urine... Unable to collect the urine.Marland KitchenMarland Kitchen

## 2023-04-06 NOTE — Discharge Instructions (Addendum)
As discussed, you tested positive for COVID which I think is most likely causing majority of your symptoms.  We will send you home with antiviral called Paxlovid to take twice daily for the next 5 days.  You may continue your home medications with this antiviral medication.  Your vaginal swab this is positive for yeast which I think is causing your vaginal irritation as well as discharge; we will place you on Diflucan for said infection.  Recommend continues of Tylenol/Motrin as needed for pain/fever, allergy medicine such as Zyrtec/Claritin/Allegra, nasal steroid spray such as Nasacort/Flonase.  Recommend follow-up with primary care for further assessment/evaluation of your symptoms.  Please do not hesitate to return to the emergency department if there are worrisome signs and symptoms we discussed to become apparent.

## 2023-04-06 NOTE — ED Provider Notes (Signed)
[place] EMERGENCY DEPARTMENT AT [facility] Provider Note   CSN: [id number] Arrival date & time: 04/06/23  0945     History  Chief Complaint  Patient presents with   Shortness of Breath    Hannah Fleming is a 42 y.o. female.   Shortness of Breath   42 year old female presents emergency department with multiple complaints.  Patient states that symptoms began this past Friday.  States that she woke up with diffuse bodyaches, mildly sore throat, and nasal congestion.  States that she also developed some vaginal irritation at that time.  Reports vaginal itching sensation with "thick white" vaginal discharge which has remained persistent over the past 2 days.  Denies any known sick contact but was concerned about COVID and had negative at home test on Saturday.  Reports continued symptoms through the weekend of generalized weakness, diffuse bodyaches, mild cough, sore throat prompting visit to the emergency department today.  She states she has a history of fibromyalgia and is unaware of whether or not she is having a "fibromyalgia flareup" or if the body aches are from something different.  States that she has a known latex allergy and participated in penetrative vaginal intercourse earlier this month on 8/2 and developed vaginal irritation as well as rash at that time; she states she took Xyzal and Pepcid of which resolved her symptoms  day after.  She reports similar vaginal irritation feeling at that time but was without vaginal discharge.  Also reports developing central chest pressure last night worsened with coughing.  Reports some mild associated shortness of breath.  Denies any history of DVT/PE, recent surgery/immobilization, known coagulopathy, no malignancy, hormonal therapy.  Past medical history significant for arthritis, genital herpes, fibroids,  Home Medications Prior to Admission medications   Medication Sig Start Date End Date Taking? Authorizing Provider   benzonatate (TESSALON) 100 MG capsule Take 1 capsule (100 mg total) by mouth every 8 (eight) hours. 04/06/23  Yes Sherian Maroon A, PA  ibuprofen (ADVIL) 600 MG tablet Take 1 tablet (600 mg total) by mouth every 6 (six) hours as needed. 04/06/23  Yes Sherian Maroon A, PA  nirmatrelvir & ritonavir (PAXLOVID, 300/100,) 20 x 150 MG & 10 x 100MG  TBPK Take 3 tablets by mouth 2 (two) times daily for 5 days. 04/06/23 04/11/23 Yes Sherian Maroon A, PA  acetaminophen (TYLENOL) 500 MG tablet Take 1,000 mg by mouth every 6 (six) hours as needed for mild pain or moderate pain.    [provider]  DULoxetine (CYMBALTA) 20 MG capsule TAKE 1 CAPSULE BY MOUTH EVERY DAY 02/01/23   [name], NP  DULoxetine (CYMBALTA) 30 MG capsule Take 1 capsule (30 mg total) by mouth daily. Patient not taking: Reported on 03/09/2023 02/09/23   [name], MD  methocarbamol (ROBAXIN) 500 MG tablet Take 1 tablet (500 mg total) by mouth daily as needed for muscle spasms. 09/08/22   Gearldine Bienenstock, PA-C      Allergies    Prilosec otc [omeprazole magnesium] and Latex    Review of Systems   Review of Systems  Respiratory:  Positive for shortness of breath.   All other systems reviewed and are negative.   Physical Exam Updated Vital Signs BP 124/77   Pulse 77   Temp 98.1 F (36.7 C) (Oral)   Resp 17   LMP 02/22/2023 (Approximate)   SpO2 99%  Physical Exam Vitals and nursing note reviewed. Exam conducted with a chaperone present.  Constitutional:  General: She is not in acute distress.    Appearance: She is well-developed.  HENT:     Head: Normocephalic and atraumatic.     Right Ear: Tympanic membrane, ear canal and external ear normal.     Left Ear: Tympanic membrane, ear canal and external ear normal.     Nose: Nose normal.     Mouth/Throat:     Comments: Mild posterior pharyngeal erythema.  Uvula midline and symmetric with phonation.  No sublingual or submandibular swelling.  Tonsils are 1+  bilaterally with no obvious exudate.  No trismus.  No changes in phonation per patient. Eyes:     Conjunctiva/sclera: Conjunctivae normal.  Cardiovascular:     Rate and Rhythm: Normal rate and regular rhythm.     Heart sounds: No murmur heard. Pulmonary:     Effort: Pulmonary effort is normal. No respiratory distress.     Breath sounds: Normal breath sounds. No wheezing, rhonchi or rales.  Abdominal:     Palpations: Abdomen is soft.     Tenderness: There is no abdominal tenderness.  Genitourinary:    Vagina: Normal.     Cervix: No cervical motion tenderness, erythema or cervical bleeding.     Comments: Pelvic exam performed with nursing staff at bedside.  Externally, patient without abnormality.  Internally, patient with thick white vaginal discharge in vaginal canal.  No evidence of bleeding.  No evidence of rash. Musculoskeletal:        General: No swelling.     Cervical back: Neck supple.     Right lower leg: No edema.     Left lower leg: No edema.  Skin:    General: Skin is warm and dry.     Capillary Refill: Capillary refill takes less than 2 seconds.  Neurological:     Mental Status: She is alert.  Psychiatric:        Mood and Affect: Mood normal.     ED Results / Procedures / Treatments   Labs (all labs ordered are listed, but only abnormal results are displayed) Labs Reviewed  RESP PANEL BY RT-PCR (RSV, FLU A&B, COVID)  RVPGX2 - Abnormal; Notable for the following components:      Result Value   SARS Coronavirus 2 by RT PCR POSITIVE (*)    All other components within normal limits  WET PREP, GENITAL - Abnormal; Notable for the following components:   Yeast Wet Prep HPF POC PRESENT (*)    WBC, Wet Prep HPF POC >=10 (*)    All other components within normal limits  BASIC METABOLIC PANEL - Abnormal; Notable for the following components:   Glucose, Bld 106 (*)    All other components within normal limits  URINALYSIS, ROUTINE W REFLEX MICROSCOPIC - Abnormal; Notable  for the following components:   Hgb urine dipstick TRACE (*)    Leukocytes,Ua LARGE (*)    All other components within normal limits  CBC  PREGNANCY, URINE  GC/CHLAMYDIA PROBE AMP () NOT AT [facility]  TROPONIN I (HIGH SENSITIVITY)    EKG None  Radiology DG Chest 2 View  Result Date: 04/06/2023 CLINICAL DATA:  Chest pain EXAM: CHEST - 2 VIEW COMPARISON:  10/05/2021 FINDINGS: Cardiomediastinal silhouette and pulmonary vasculature are within normal limits. Lungs are clear. IMPRESSION: No acute cardiopulmonary process. Electronically Signed   By: Acquanetta Belling M.D.   On: 04/06/2023 11:50    Procedures Procedures    Medications Ordered in ED Medications  sodium chloride 0.9 % bolus 1,000 mL (  0 mLs Intravenous Stopped 04/06/23 1238)  ketorolac (TORADOL) 15 MG/ML injection 15 mg (15 mg Intravenous Given 04/06/23 1108)    ED Course/ Medical Decision Making/ A&P                                 Medical Decision Making  This patient presents to the ED for concern of myalgias, cough, vaginal discharge, this involves an extensive number of treatment options, and is a complaint that carries with it a high risk of complications and morbidity.  The differential diagnosis includes COVID, influenza, RSV, pneumonia, vulvovaginal candidiasis, gonorrhea/chlamydia, BV, trichomoniasis   Co morbidities that complicate the patient evaluation  See HPI   Additional history obtained:  Additional history obtained from EMR External records from outside source obtained and reviewed including hospital records   Lab Tests:  I Ordered, and personally interpreted labs.  The pertinent results include: No leukocytosis.  No evidence of anemia.  Placed within normal range.  Urine pregnancy negative.  UA significant for large leukocytes, 6-10 RBCs but otherwise unremarkable.**   Imaging Studies ordered:  I ordered imaging studies including chest x-ray I independently visualized and interpreted  imaging which showed no acute cardiopulmonary abnormalities I agree with the radiologist interpretation   Cardiac Monitoring: / EKG:  The patient was maintained on a cardiac monitor.  I personally viewed and interpreted the cardiac monitored which showed an underlying rhythm of: No leukocytosis.  No evidence of anemia.  Pace within range.  No electrolyte abnormalities.  No renal dysfunction.  UA significant for large leukocytes, 6-10 RBCs but otherwise unremarkable.  Respiratory viral panel positive for COVID.  Urine pregnancy negative.  Initial troponin less than 2.  Wet prep positive for yeast as well as WBC.   Consultations Obtained:  N/a   Problem List / ED Course / Critical interventions / Medication management  COVID, myalgia, sore throat, nasal congestion, vaginal discharge, vulvovaginal candidiasis I ordered medication including 1 L of normal saline, Toradol   Reevaluation of the patient after these medicines showed that the patient improved I have reviewed the patients home medicines and have made adjustments as needed   Social Determinants of Health:  Former cigarette use.  Denies illicit drug use.   Test / Admission - Considered:  COVID, myalgia, sore throat, nasal congestion, vaginal discharge, vulvovaginal candidiasis Vitals signs within normal range and stable throughout visit. Laboratory/imaging studies significant for: See above 42 year old female presents emergency department with complaints of sore throat, muscle aches, nasal congestion, vaginal discharge/itching for the past 3 to 4 days.  Regarding vaginal discharge/itching, patient with thick white discharge on pelvic exam without appreciable rash externally.  Patient with positive wet prep for yeast concerning for vulvovaginal candidiasis.  Will treat with Diflucan in the outpatient setting and have her follow-up with primary care.  Regarding sore throat, muscle aches, nasal congestion, most likely secondary to  current COVID infection.  Discussed with patient regarding beginning antivirals up which she elected for given she is within treatment window.  Will recommend additional symptomatic therapy as listed in AVS.  Regarding chest pain, seems solely isolated to coughing episodes and not experienced otherwise with exertion, consumption of food.  Pain not pleuritic in nature without tachycardia, tachypnea, hypoxia; patient with Wells PE score of 0 and PERC negative so doubt PE.  Patient with negative troponin, lack of acute ischemic change on EKG so doubt ACS.  Patient without evidence of pneumonia  clinically or on chest x-ray imaging so withhold from empiric antibiotic treatment at this time.  No evidence of pneumothorax on chest x-ray imaging.  I suspect the patient's chest pain is likely secondary to musculoskeletal etiology.  Will treat with NSAIDs in the outpatient setting.  Will give cough suppressant to use as needed. Will recommend close follow-up with primary care in the outpatient setting for further evaluation of symptoms.  Patient overall well-appearing, afebrile in no acute distress.  Treatment plan discussed at length with patient and she knowledge understanding was agreeable to said plan. Worrisome signs and symptoms were discussed with the patient, and the patient acknowledged understanding to return to the ED if noticed. Patient was stable upon discharge.          Final Clinical Impression(s) / ED Diagnoses Final diagnoses:  COVID  Vaginal discharge  Vulvovaginal candidiasis  Myalgia  Sore throat  Nasal congestion    Rx / DC Orders ED Discharge Orders          Ordered    nirmatrelvir & ritonavir (PAXLOVID, 300/100,) 20 x 150 MG & 10 x 100MG  TBPK  2 times daily        04/06/23 1242    benzonatate (TESSALON) 100 MG capsule  Every 8 hours        04/06/23 1242    ibuprofen (ADVIL) 600 MG tablet  Every 6 hours PRN        04/06/23 1242              Peter Garter,  PA 04/06/23 1253    Coral Spikes, DO 04/06/23 1612

## 2023-04-06 NOTE — ED Triage Notes (Signed)
Friday felt weak,tired.Saturday woke up felt tired/weak, went to work , works outside, felt dizzy,lightheaded,itchy throat. Pt has tooth on the right side that needs a root canal. Pt states covid test that day was negative at home. She was starting to feel like her chest was heavy. No fevers. Pt states on the outside of her vagina is irritated.   On August 2nd, pt had sex, latex condoms and she is allergic to latex. Took pepcid and benadryl.

## 2023-04-06 NOTE — Progress Notes (Signed)
RT assessed Pt. Her lungs were clear and her vitals were stable

## 2023-04-12 ENCOUNTER — Encounter: Payer: Self-pay | Admitting: Family Medicine

## 2023-04-12 ENCOUNTER — Ambulatory Visit (INDEPENDENT_AMBULATORY_CARE_PROVIDER_SITE_OTHER): Payer: Medicaid Other | Admitting: Family Medicine

## 2023-04-12 VITALS — BP 116/85 | HR 79 | Temp 98.7°F | Ht 65.0 in | Wt 208.8 lb

## 2023-04-12 DIAGNOSIS — M797 Fibromyalgia: Secondary | ICD-10-CM | POA: Diagnosis not present

## 2023-04-12 DIAGNOSIS — F32A Depression, unspecified: Secondary | ICD-10-CM | POA: Diagnosis not present

## 2023-04-12 NOTE — Progress Notes (Unsigned)
Established Patient Office Visit  Subjective    Patient ID: DEVIDA FOTI, female    DOB: Aug 04, 1981  Age: 42 y.o. MRN: [id number]  CC:  Chief Complaint  Patient presents with   Fibromyalgia    HPI BEYZA SCHONS presents for   Outpatient Encounter Medications as of 04/12/2023  Medication Sig   acetaminophen (TYLENOL) 500 MG tablet Take 1,000 mg by mouth every 6 (six) hours as needed for mild pain or moderate pain.   benzonatate (TESSALON) 100 MG capsule Take 1 capsule (100 mg total) by mouth every 8 (eight) hours.   DULoxetine (CYMBALTA) 20 MG capsule TAKE 1 CAPSULE BY MOUTH EVERY DAY   DULoxetine (CYMBALTA) 30 MG capsule Take 1 capsule (30 mg total) by mouth daily.   ibuprofen (ADVIL) 600 MG tablet Take 1 tablet (600 mg total) by mouth every 6 (six) hours as needed.   methocarbamol (ROBAXIN) 500 MG tablet Take 1 tablet (500 mg total) by mouth daily as needed for muscle spasms.   No facility-administered encounter medications on file as of 04/12/2023.    Past Medical History:  Diagnosis Date   Arthritis    RA   Fibroids    Genital herpes    no outbreak in 67yrs   Trichomonas infection    Urticaria     Past Surgical History:  Procedure Laterality Date   DILATATION & CURETTAGE/HYSTEROSCOPY WITH MYOSURE N/A 10/22/2021   Procedure: DILATATION & CURETTAGE/HYSTEROSCOPY WITH MYOSURE;  Surgeon: [name], MD;  Location: [facility];  Service: Gynecology;  Laterality: N/A;   DILATION AND CURETTAGE OF UTERUS  2020   DILATION AND EVACUATION N/A 07/09/2022   Procedure: DILATATION AND EVACUATION;  Surgeon: Catalina Antigua, MD;  Location: [facility] OR;  Service: Gynecology;  Laterality: N/A;   INDUCED ABORTION     WISDOM TOOTH EXTRACTION      Family History  Problem Relation Age of Onset   Healthy Mother    Sickle cell trait Mother    Healthy Father    Sickle cell trait Sister    Sickle cell trait Brother    Healthy Daughter    Healthy Son     Healthy Son    Other Neg Hx     Social History   Socioeconomic History   Marital status: Single    Spouse name: Not on file   Number of children: 3   Years of education: Not on file   Highest education level: Not on file  Occupational History   Not on file  Tobacco Use   Smoking status: Former    Current packs/day: 0.00    Average packs/day: 1 pack/day for 4.0 years (4.0 ttl pk-yrs)    Types: Cigarettes    Start date: 02/07/2005    Quit date: 02/07/2009    Years since quitting: 14.1    Passive exposure: Never   Smokeless tobacco: Never  Vaping Use   Vaping status: Never Used  Substance and Sexual Activity   Alcohol use: Yes    Comment: OCC   Drug use: No   Sexual activity: Yes    Partners: Male    Birth control/protection: None    Comment: approx [redacted] wks gestation per patient  Other Topics Concern   Not on file  Social History Narrative   Not on file   Social Determinants of Health   Financial Resource Strain: Low Risk  (02/01/2023)   Overall Financial Resource Strain (CARDIA)    Difficulty of Paying Living  Expenses: Not very hard  Food Insecurity: No Food Insecurity (11/17/2022)   Hunger Vital Sign    Worried About Running Out of Food in the Last Year: Never true    Ran Out of Food in the Last Year: Never true  Transportation Needs: No Transportation Needs (11/11/2022)   PRAPARE - Administrator, Civil Service (Medical): No    Lack of Transportation (Non-Medical): No  Physical Activity: Sufficiently Active (03/03/2023)   Exercise Vital Sign    Days of Exercise per Week: 7 days    Minutes of Exercise per Session: 30 min  Stress: No Stress Concern Present (02/01/2023)   [employer] of Occupational Health - Occupational Stress Questionnaire    Feeling of Stress : Only a little  Social Connections: Socially Integrated (03/03/2023)   Social Connection and Isolation Panel [NHANES]    Frequency of Communication with Friends and Family: More than three  times a week    Frequency of Social Gatherings with Friends and Family: More than three times a week    Attends Religious Services: 1 to 4 times per year    Active Member of [employer] or Organizations: Yes    Attends Banker Meetings: 1 to 4 times per year    Marital Status: Living with partner  Intimate Partner Violence: Not At Risk (11/17/2022)   Humiliation, Afraid, Rape, and Kick questionnaire    Fear of Current or Ex-Partner: No    Emotionally Abused: No    Physically Abused: No    Sexually Abused: No    ROS      Objective    BP 116/85   Pulse 79   Temp 98.7 F (37.1 C) (Oral)   Ht 5\' 5"  (1.651 m)   Wt 208 lb 12.8 oz (94.7 kg)   LMP 04/11/2023   SpO2 98%   BMI 34.75 kg/m   Physical Exam  {Labs (Optional):23779}    Assessment & Plan:   Problem List Items Addressed This Visit   None   No follow-ups on file.   Tommie Raymond, MD

## 2023-04-12 NOTE — Progress Notes (Signed)
Pt states she is recovering from covid.

## 2023-04-13 ENCOUNTER — Ambulatory Visit (INDEPENDENT_AMBULATORY_CARE_PROVIDER_SITE_OTHER): Payer: Medicaid Other | Admitting: Obstetrics and Gynecology

## 2023-04-13 ENCOUNTER — Encounter: Payer: Self-pay | Admitting: Obstetrics and Gynecology

## 2023-04-13 VITALS — BP 121/88 | HR 73 | Wt 209.0 lb

## 2023-04-13 DIAGNOSIS — N946 Dysmenorrhea, unspecified: Secondary | ICD-10-CM | POA: Diagnosis not present

## 2023-04-13 DIAGNOSIS — N920 Excessive and frequent menstruation with regular cycle: Secondary | ICD-10-CM | POA: Diagnosis not present

## 2023-04-13 NOTE — Progress Notes (Signed)
Pt is in office for painful cycles. Pt states cycles have been shorter and not as heavy. Pt does have Firbormyalgia, ? Related to current pain.  Pt was taken off BC due to elevated BP.   Pt would like to discuss medical tx vs any type of surgery.

## 2023-04-13 NOTE — Progress Notes (Signed)
Hannah Fleming presents to discuss her cycles and dysmenorrhea She has had Sonata in the past with Dr. Donavan Foil. Cycles were lighter after the procedure but became pregnant and had a MAB requiring a D & C.  She has also since been Dx with fibromyalgia and is followed by PCP and Rheumatology .  She tried OCP's after her D & C for cycle control and [facility] but had to stop due to increased BP.  Cycles have been monthly for the most part except for this month. She had 2 cycles. Cramps with cycles but hard to distinguish form her fibromyalgia pain. Last 5 days.   She is present not sexual active Pap smear is UTD  PE AF VSS Lungs clear Heart RRR Abd soft + BS  A/P Menorrghia        Dysmenorrhea  Will check GYN U/S. Have pt follow up with Dr. Donavan Foil to discuss results and further Tx options.

## 2023-04-14 ENCOUNTER — Encounter: Payer: Self-pay | Admitting: Family Medicine

## 2023-04-20 ENCOUNTER — Ambulatory Visit (HOSPITAL_BASED_OUTPATIENT_CLINIC_OR_DEPARTMENT_OTHER)
Admission: RE | Admit: 2023-04-20 | Discharge: 2023-04-20 | Disposition: A | Discharge status: Home / Self Care | Payer: Medicaid Other | Source: Ambulatory Visit | Attending: Obstetrics and Gynecology | Admitting: Obstetrics and Gynecology

## 2023-04-20 DIAGNOSIS — N939 Abnormal uterine and vaginal bleeding, unspecified: Secondary | ICD-10-CM | POA: Diagnosis not present

## 2023-04-20 DIAGNOSIS — N946 Dysmenorrhea, unspecified: Secondary | ICD-10-CM | POA: Diagnosis present

## 2023-04-20 DIAGNOSIS — N920 Excessive and frequent menstruation with regular cycle: Secondary | ICD-10-CM | POA: Diagnosis not present

## 2023-05-04 ENCOUNTER — Encounter: Payer: Self-pay | Admitting: Obstetrics and Gynecology

## 2023-05-04 ENCOUNTER — Other Ambulatory Visit: Payer: Medicaid Other | Admitting: Obstetrics and Gynecology

## 2023-05-04 NOTE — Patient Instructions (Signed)
Visit Information Hi Ms. Stradling, thank you for updating me this morning-have a nice day!  Ms. Bramlet was given information about Medicaid Managed Care team care coordination services as a part of their [facility] Medicaid benefit. [name] verbally consented to engagement with the [facility] Managed Care team.   If you are experiencing a medical emergency, please call 911 or report to your local emergency department or urgent care.   If you have a non-emergency medical problem during routine business hours, please contact your provider's office and ask to speak with a nurse.   For questions related to your [facility], please call: [phone] or visit the homepage here: [email or web address]  If you would like to schedule transportation through your [facility], please call the following number at least 2 days in advance of your appointment: [phone]   Rides for urgent appointments can also be made after hours by calling Member Services.  Call the Behavioral Health Crisis Line at [phone], at any time, 24 hours a day, 7 days a week. If you are in danger or need immediate medical attention call 911.  If you would like help to quit smoking, call 1-800-QUIT-NOW ((312) 261-7016) OR Espaol: 1-855-Djelo-Ya (1-062-694-8546) o para ms informacin haga clic aqu or Text READY to 270-350 to register via text  Ms. Loux - following are the goals we discussed in your visit today:   Goals Addressed             This Visit's Progress    Cope with Chronic Pain       Timeframe:  Long-Range Goal Priority:  Medium Start Date:     04/15/21                        Expected End Date:    ongoing                   Follow Up Date 06/03/23   - learn relaxation techniques - practice acceptance of chronic pain - practice relaxation or meditation daily - spend time  with positive people - think of new ways to do favorite things - use distraction techniques - use relaxation during pain  - review written information mailed to me on chronic pain   Why is this important?   Stress makes chronic pain feel worse.  Feelings like depression, anxiety, stress and anger can make your body more sensitive to pain.  Learning ways to cope with stress or depression may help you find some relief from the pain.   05/04/23: medication adjusted by PCP, no PT right now.   Patient verbalizes understanding of instructions and care plan provided today and agrees to view in MyChart. Active MyChart status and patient understanding of how to access instructions and care plan via MyChart confirmed with patient.     The Managed Medicaid care management team will reach out to the patient again over the next 30 business  days.  The  Patient  has been provided with contact information for the Managed Medicaid care management team and has been advised to call with any health related questions or concerns.   Kathi Der RN, BSN [place]  [facility] Care Management Coordinator - Managed Medicaid High Risk [phone]   Following is a copy of your plan of care:  Care Plan : RN Care Manager Plan Of Care  Updates made by Bradie Sangiovanni, Calvert Cantor, RN since  05/04/2023 12:00 AM     Problem: Chronic Disease Management and Care Coordination Needs      Long-Range Goal: Development of Plan Of Care For Chronic Disease Management and Care Coordination Needs To Assist With Meeting Treatment Goals   Start Date: 04/15/2021  Expected End Date: 06/03/2023  Priority: High  Note:   Current Barriers:  Chronic Disease Management support and education needs related to weight concerns, depression/anxiety, chronic pain, DDD, osteoarthritis 05/04/23: Medication adjustment by PCP. Has dental resources and is following up with dentist.  Upcoming GYN and PCP appt, no PT right now.  Seen in ED 8/13  for SOB, COVID  RNCM Clinical Goal(s): Patient will verbalize basic understanding of weight management and depression disease process and self health management plan .  take all medications exactly as prescribed and will call provider for medication related questions attend all scheduled medical appointments: with primary care and specialist demonstrate ongoing adherence to prescribed treatment plan for  weight management and depression as evidenced by exercise 5-7 days/week adherence to prescribed medication regimen contacting provider for new or worsened symptoms or questions . continue to work with RN Care Manager to address care management and care coordination needs related to weight management and depression work with Child psychotherapist to address depression through collaboration with Medical illustrator, provider, and care tem.   Interventions Inter-disciplinary  care team collaboration (see longitudinal plan of care) Evaluation of current treatment plan related to  self management and patient's adherence to plan as established by provider Collaborated with LCSW LCSW referral for depression-completed Collaborated with BSW BSW referral for eye and dental providers at patient request-completed PCP sent message for nutrition referral-completed  Weight Loss:  (Status: Goal on track: NO.) Provided patient with contact information for managed Medicaid health plan customer service phone number about weight management benefits  05/13/21- Reviewed weight management benefits offered by patient's health plan and again encouraged her to call customer service for details  PreDiabetes:  (Status: Goal on track: NO.) Lab Results  Component Value Date   HGBA1C 5.7 (H) 12/06/2019   Assessed patient's understanding of A1c goal:  <5.7% Provided education to patient about basic prediabetes disease process; Provided written information on prediabetes eating plan Counseled on importance of regular laboratory  monitoring as prescribed;        Discussed plans with patient for ongoing care management follow up and provided patient with direct contact information for care management team      Pain:  (Status: New goal. Goal on track: YES.) Pain assessment performed Medications reviewed Reviewed provider established plan for pain management; Discussed importance of adherence to all scheduled medical appointments; Counseled on the importance of reporting any/all new or changed pain symptoms or management strategies to pain management provider; Reviewed with patient prescribed pharmacological and nonpharmacological pain relief strategies; Provided written health plan information related to alternative healing benefits such as  massage and acupuncture  Patient Goals/Self-Care Activities: Patient will self administer medications as prescribed  Patient will attend all scheduled provider appointments  Patient will review educational material mailed to her on 04/15/21 Patient will continue to perform ADL's independently Patient will continue to perform IADL's independently Patient will call provider office for new concerns or questions Patient will follow up with provider regarding medication and weight management

## 2023-05-04 NOTE — Patient Outreach (Signed)
Medicaid Managed Care   Nurse Care Manager Note  05/04/2023 Name:  Hannah Fleming MRN:  [id number] DOB:  06/07/1981  Hannah Fleming is an 42 y.o. year old female who is a primary patient of [name], MD.  The [facility] Managed Care Coordination team was consulted for assistance with:    Chronic healthcare management needs, anxiety/depression, chronic pain, osteoarthritis, fibromyalgia, positive RNP, LBP, myofascial pain syndrome  Hannah Fleming was given information about Medicaid Managed Care Coordination team services today. [name] Patient agreed to services and verbal consent obtained.  Engaged with patient by telephone for follow up visit in response to provider referral for case management and/or care coordination services.   Assessments/Interventions:  Review of past medical history, allergies, medications, health status, including review of consultants reports, laboratory and other test data, was performed as part of comprehensive evaluation and provision of chronic care management services.  SDOH (Social Determinants of Health) assessments and interventions performed: SDOH Interventions    Flowsheet Row Patient Outreach Telephone from 05/04/2023 in [place] POPULATION HEALTH DEPARTMENT Patient Outreach Telephone from 03/03/2023 in [place] POPULATION HEALTH DEPARTMENT Patient Outreach Telephone from 02/01/2023 in [place] POPULATION HEALTH DEPARTMENT Patient Outreach Telephone from 12/17/2022 in [place] POPULATION HEALTH DEPARTMENT Patient Outreach Telephone from 11/17/2022 in [place] POPULATION HEALTH DEPARTMENT Patient Outreach Telephone from 11/11/2022 in [place] POPULATION HEALTH DEPARTMENT  SDOH Interventions        Food Insecurity Interventions -- -- -- -- Intervention Not Indicated --  Housing Interventions Intervention Not Indicated -- -- -- -- Intervention Not Indicated  Transportation Interventions -- -- -- -- -- Intervention Not Indicated   Utilities Interventions -- -- -- Intervention Not Indicated -- --  Alcohol Usage Interventions -- -- -- Intervention Not Indicated (Score <7) -- --  Financial Strain Interventions -- -- Intervention Not Indicated -- -- --  Physical Activity Interventions -- Intervention Not Indicated -- -- -- --  Stress Interventions -- -- Intervention Not Indicated -- -- --  Social Connections Interventions -- Intervention Not Indicated -- -- -- --  Health Literacy Interventions Intervention Not Indicated -- -- -- -- --     Care Plan Allergies  Allergen Reactions   Prilosec Otc [Omeprazole Magnesium] Hives   Latex Itching and Rash    Medications Reviewed Today     Reviewed by Danie Chandler, RN (Registered Nurse) on 05/04/23 at [phone]  Med List Status: <None>   Medication Order Taking? Sig Documenting Provider Last Dose Status Informant  acetaminophen (TYLENOL) 500 MG tablet [id number] No Take 1,000 mg by mouth every 6 (six) hours as needed for mild pain or moderate pain. [provider] Taking Active Self  benzonatate (TESSALON) 100 MG capsule [id number] No Take 1 capsule (100 mg total) by mouth every 8 (eight) hours. Peter Garter, PA Taking Active   Brexpiprazole (REXULTI PO) [id number]  Take by mouth. [provider]  Active   DULoxetine (CYMBALTA) 20 MG capsule [id number] No TAKE 1 CAPSULE BY MOUTH EVERY DAY [name], NP Taking Active   DULoxetine (CYMBALTA) 30 MG capsule [id number] No Take 1 capsule (30 mg total) by mouth daily. [name], MD Taking Active            Med Note [facility], Calvert Cantor   Tue May 04, 2023  9:17 AM) Not taking 20mg   ibuprofen (ADVIL) 600 MG tablet [id number] No Take 1 tablet (600 mg total) by mouth every 6 (six) hours as needed. Sherian Maroon  A, PA Taking Active   methocarbamol (ROBAXIN) 500 MG tablet [id number] No Take 1 tablet (500 mg total) by mouth daily as needed for muscle spasms. Gearldine Bienenstock, PA-C Taking Active            Patient  Active Problem List   Diagnosis Date Noted   Prediabetes 06/21/2019   Ear itch 03/30/2019   Tonsillar calculus 03/30/2019   Menorrhagia with regular cycle 12/28/2013   Anxiety 02/07/2013   Dysmenorrhea 02/07/2013   Conditions to be addressed/monitored per PCP order:  Chronic healthcare management needs, anxiety/depression, chronic pain, osteoarthritis, fibromyalgia, positive RNP, LBP, myofascial pain syndrome  Care Plan : RN Care Manager Plan Of Care  Updates made by Danie Chandler, RN since 05/04/2023 12:00 AM     Problem: Chronic Disease Management and Care Coordination Needs      Long-Range Goal: Development of Plan Of Care For Chronic Disease Management and Care Coordination Needs To Assist With Meeting Treatment Goals   Start Date: 04/15/2021  Expected End Date: 06/03/2023  Priority: High  Note:   Current Barriers:  Chronic Disease Management support and education needs related to weight concerns, depression/anxiety, chronic pain, DDD, osteoarthritis 05/04/23: Medication adjustment by PCP. Has dental resources and is following up with dentist.  Upcoming GYN and PCP appt, no PT right now.  Seen in ED 8/13 for SOB, COVID  RNCM Clinical Goal(s): Patient will verbalize basic understanding of weight management and depression disease process and self health management plan .  take all medications exactly as prescribed and will call provider for medication related questions attend all scheduled medical appointments: with primary care and specialist demonstrate ongoing adherence to prescribed treatment plan for  weight management and depression as evidenced by exercise 5-7 days/week adherence to prescribed medication regimen contacting provider for new or worsened symptoms or questions . continue to work with RN Care Manager to address care management and care coordination needs related to weight management and depression work with Child psychotherapist to address depression through collaboration  with Medical illustrator, provider, and care tem.   Interventions Inter-disciplinary  care team collaboration (see longitudinal plan of care) Evaluation of current treatment plan related to  self management and patient's adherence to plan as established by provider Collaborated with LCSW LCSW referral for depression-completed Collaborated with BSW BSW referral for eye and dental providers at patient request-completed PCP sent message for nutrition referral-completed  Weight Loss:  (Status: Goal on track: NO.) Provided patient with contact information for managed Medicaid health plan customer service phone number about weight management benefits  05/13/21- Reviewed weight management benefits offered by patient's health plan and again encouraged her to call customer service for details  PreDiabetes:  (Status: Goal on track: NO.) Lab Results  Component Value Date   HGBA1C 5.7 (H) 12/06/2019   Assessed patient's understanding of A1c goal:  <5.7% Provided education to patient about basic prediabetes disease process; Provided written information on prediabetes eating plan Counseled on importance of regular laboratory monitoring as prescribed;        Discussed plans with patient for ongoing care management follow up and provided patient with direct contact information for care management team      Pain:  (Status: New goal. Goal on track: YES.) Pain assessment performed Medications reviewed Reviewed provider established plan for pain management; Discussed importance of adherence to all scheduled medical appointments; Counseled on the importance of reporting any/all new or changed pain symptoms or management strategies to pain management provider; Reviewed  with patient prescribed pharmacological and nonpharmacological pain relief strategies; Provided written health plan information related to alternative healing benefits such as  massage and acupuncture  Patient Goals/Self-Care  Activities: Patient will self administer medications as prescribed  Patient will attend all scheduled provider appointments  Patient will review educational material mailed to her on 04/15/21 Patient will continue to perform ADL's independently Patient will continue to perform IADL's independently Patient will call provider office for new concerns or questions Patient will follow up with provider regarding medication and weight management    Follow Up:  Patient agrees to Care Plan and Follow-up.  Plan: The Managed Medicaid care management team will reach out to the patient again over the next 30 business  days. and The  Patient has been provided with contact information for the Managed Medicaid care management team and has been advised to call with any health related questions or concerns.  Date/time of next scheduled RN care management/care coordination outreach:  06/03/23 at 0900.

## 2023-05-05 ENCOUNTER — Encounter: Payer: Medicaid Other | Admitting: Family Medicine

## 2023-05-05 ENCOUNTER — Telehealth: Payer: Medicaid Other | Admitting: Family Medicine

## 2023-05-06 ENCOUNTER — Other Ambulatory Visit (HOSPITAL_COMMUNITY)
Admission: RE | Admit: 2023-05-06 | Discharge: 2023-05-06 | Disposition: A | Discharge status: Home / Self Care | Payer: Medicaid Other | Source: Ambulatory Visit | Attending: Obstetrics and Gynecology | Admitting: Obstetrics and Gynecology

## 2023-05-06 ENCOUNTER — Encounter: Payer: Self-pay | Admitting: Obstetrics and Gynecology

## 2023-05-06 ENCOUNTER — Ambulatory Visit (INDEPENDENT_AMBULATORY_CARE_PROVIDER_SITE_OTHER): Payer: Medicaid Other | Admitting: Obstetrics and Gynecology

## 2023-05-06 VITALS — BP 127/85 | HR 92 | Ht 65.0 in | Wt 209.0 lb

## 2023-05-06 DIAGNOSIS — N898 Other specified noninflammatory disorders of vagina: Secondary | ICD-10-CM | POA: Insufficient documentation

## 2023-05-06 DIAGNOSIS — N926 Irregular menstruation, unspecified: Secondary | ICD-10-CM

## 2023-05-06 NOTE — Progress Notes (Signed)
42 y.o. GYN presents for [place] FU.  C/o vaginal itching, irritation, white, clumpy discharge x 3 days

## 2023-05-06 NOTE — Progress Notes (Signed)
Patient did not come on video visit

## 2023-05-07 DIAGNOSIS — N926 Irregular menstruation, unspecified: Secondary | ICD-10-CM | POA: Insufficient documentation

## 2023-05-07 LAB — CERVICOVAGINAL ANCILLARY ONLY
Bacterial Vaginitis (gardnerella): POSITIVE — AB
Candida Glabrata: NEGATIVE
Candida Vaginitis: POSITIVE — AB
Comment: NEGATIVE
Comment: NEGATIVE
Comment: NEGATIVE
Comment: NEGATIVE
Trichomonas: NEGATIVE

## 2023-05-07 NOTE — Progress Notes (Signed)
CC: irregular bleeding Subjective:    Patient ID: Hannah Fleming, female    DOB: November 16, 1980, 42 y.o.   MRN: [id number]  HPI 42 yo G9P3 seen for discussion of irregular bleeding.  Pt is s/p [place] procedure 3/23 for 3.1 x 2.2 cm fibroid. Bleeding improved post procedure.   Pt became pregnant and unfortunately had a 12 week miscarriage.  She received a D and E 11/23.  After the procedure the patient had light but irregular uterine bleeding with a slight uptick in discomfort.  Menses are twice a month, light to moderate lasting 5 days. Pt had tried OCP, but this caused elevation in her BP and was discontinued. She was evaluated for the bleeding by Dr. Alysia Penna on 8/24.  Follow up ultrasound was normal with no fibroid seen on current scan.   Review of Systems     Objective:   Physical Exam Vitals:   05/06/23 1535  BP: 127/85  Pulse: 92  CLINICAL DATA:  Menorrhagia   EXAM: TRANSABDOMINAL AND TRANSVAGINAL ULTRASOUND OF PELVIS   TECHNIQUE: Both transabdominal and transvaginal ultrasound examinations of the pelvis were performed. Transabdominal technique was performed for global imaging of the pelvis including uterus, ovaries, adnexal regions, and pelvic cul-de-sac. It was necessary to proceed with endovaginal exam following the transabdominal exam to visualize the endometrium.   COMPARISON:  None Available.   FINDINGS: Uterus   Measurements: 8.4 x 5.5 x 6.6 cm = volume: 158.1 mL. No fibroids or other mass visualized.   Endometrium   Thickness: 5.9 mm.  No focal abnormality visualized.   Right ovary   Measurements: 1.6 x 1.6 x 1.2 cm = volume: 1.6 mL. Normal appearance/no adnexal mass.   Left ovary   Measurements: 3.7 x 2.0 x 2.4 cm = volume: 9.3 mL. Normal appearance/no adnexal mass.   Other findings   No abnormal free fluid.   IMPRESSION: 1. No acute process. 2. Endometrium measures 5.9 mm. If bleeding remains unresponsive to hormonal or medical therapy,  sonohysterogram should be considered for focal lesion work-up. (Ref: Radiological Reasoning: Algorithmic Workup of Abnormal Vaginal Bleeding with Endovaginal Sonography and Sonohysterography. AJR 2008; 295:J88-41)       Assessment & Plan:   1. Vaginal discharge Self swab by patient - Cervicovaginal ancillary only( [place])  2. Irregular uterine bleeding Pt given options for treatment.  OCPs are not an option.  I. Expectant management II. Trial of lysteda III.  High dose NSAIDs at start of menses IV. Progesterone IUD to decrease/stop bleeding V. Hysterectomy  Pt will pursue expectant management in 4 months to discuss status of current symptoms.  I spent 30 minutes dedicated to the care of this patient including previsit review of records, face to face time with the patient discussing treatment options and post visit testing.     [name], MD Faculty Attending, [facility] for [facility]

## 2023-05-13 ENCOUNTER — Other Ambulatory Visit: Payer: Self-pay

## 2023-05-13 DIAGNOSIS — B379 Candidiasis, unspecified: Secondary | ICD-10-CM

## 2023-05-13 DIAGNOSIS — N760 Acute vaginitis: Secondary | ICD-10-CM

## 2023-05-13 MED ORDER — FLUCONAZOLE 150 MG PO TABS
150.0000 mg | ORAL_TABLET | Freq: Once | ORAL | 0 refills | Status: AC
Start: 2023-05-13 — End: 2023-05-13

## 2023-05-13 MED ORDER — METRONIDAZOLE 500 MG PO TABS
500.0000 mg | ORAL_TABLET | Freq: Two times a day (BID) | ORAL | 0 refills | Status: DC
Start: 2023-05-13 — End: 2023-12-06

## 2023-05-17 ENCOUNTER — Other Ambulatory Visit: Payer: Self-pay

## 2023-05-17 DIAGNOSIS — B9689 Other specified bacterial agents as the cause of diseases classified elsewhere: Secondary | ICD-10-CM

## 2023-05-17 MED ORDER — METRONIDAZOLE 0.75 % VA GEL
1.0000 | Freq: Every day | VAGINAL | 1 refills | Status: DC
Start: 2023-05-17 — End: 2023-12-06

## 2023-06-03 ENCOUNTER — Other Ambulatory Visit: Payer: Medicaid Other | Admitting: Obstetrics and Gynecology

## 2023-06-03 NOTE — Patient Outreach (Signed)
Medicaid Managed Care   Nurse Care Manager Note  06/03/2023 Name:  Hannah Fleming MRN:  [id number] DOB:  February 21, 1981  Hannah Fleming is an 42 y.o. year old female who is a primary patient of Hannah Skeans, MD.  The [facility] Managed Care Coordination team was consulted for assistance with:    Chronic healthcare management needs, anxiety/depression, chronic pain, osteoarthritis, fibromyalgia, positive RNR, LBP, myofacial  pain syndrome.  Hannah Fleming was given information about Medicaid Managed Care Coordination team services today. Hannah Fleming Patient agreed to services and verbal consent obtained.  Engaged with patient by telephone for follow up visit in response to provider referral for case management and/or care coordination services.   Assessments/Interventions:  Review of past medical history, allergies, medications, health status, including review of consultants reports, laboratory and other test data, was performed as part of comprehensive evaluation and provision of chronic care management services.  SDOH (Social Determinants of Health) assessments and interventions performed: SDOH Interventions    Flowsheet Row Patient Outreach Telephone from 06/03/2023 in [place] POPULATION HEALTH DEPARTMENT Patient Outreach Telephone from 05/04/2023 in [place] POPULATION HEALTH DEPARTMENT Patient Outreach Telephone from 03/03/2023 in [place] POPULATION HEALTH DEPARTMENT Patient Outreach Telephone from 02/01/2023 in [place] POPULATION HEALTH DEPARTMENT Patient Outreach Telephone from 12/17/2022 in [place] POPULATION HEALTH DEPARTMENT Patient Outreach Telephone from 11/17/2022 in [place] POPULATION HEALTH DEPARTMENT  SDOH Interventions        Food Insecurity Interventions Intervention Not Indicated -- -- -- -- Intervention Not Indicated  Housing Interventions -- Intervention Not Indicated -- -- -- --  Transportation Interventions Intervention Not Indicated -- -- -- -- --   Utilities Interventions -- -- -- -- Intervention Not Indicated --  Alcohol Usage Interventions -- -- -- -- Intervention Not Indicated (Score <7) --  Financial Strain Interventions -- -- -- Intervention Not Indicated -- --  Physical Activity Interventions -- -- Intervention Not Indicated -- -- --  Stress Interventions -- -- -- Intervention Not Indicated -- --  Social Connections Interventions -- -- Intervention Not Indicated -- -- --  Health Literacy Interventions -- Intervention Not Indicated -- -- -- --     Care Plan Allergies  Allergen Reactions   Prilosec Otc [Omeprazole Magnesium] Hives   Latex Itching and Rash    Medications Reviewed Today     Reviewed by Danie Chandler, RN (Registered Nurse) on 06/03/23 at [phone]  Med List Status: <None>   Medication Order Taking? Sig Documenting Provider Last Dose Status Informant  acetaminophen (TYLENOL) 500 MG tablet [id number]  Take 1,000 mg by mouth every 6 (six) hours as needed for mild pain or moderate pain. [provider]  Active Self  benzonatate (TESSALON) 100 MG capsule [id number]  Take 1 capsule (100 mg total) by mouth every 8 (eight) hours. Peter Garter, PA  Active   Brexpiprazole (REXULTI PO) [id number]  Take by mouth. [provider]  Active   DULoxetine (CYMBALTA) 20 MG capsule [id number]  TAKE 1 CAPSULE BY MOUTH EVERY DAY Hannah Fendt, NP  Active   DULoxetine (CYMBALTA) 30 MG capsule [id number]  Take 1 capsule (30 mg total) by mouth daily. Hannah Skeans, MD  Active            Med Note [facility], Calvert Cantor   Tue May 04, 2023  9:17 AM) Not taking 20mg   ibuprofen (ADVIL) 600 MG tablet [id number]  Take 1 tablet (600 mg total) by mouth every 6 (six) hours as needed. Sherral Hammers,  [place] A, PA  Active   methocarbamol (ROBAXIN) 500 MG tablet [id number]  Take 1 tablet (500 mg total) by mouth daily as needed for muscle spasms. Gearldine Bienenstock, PA-C  Active   metroNIDAZOLE (FLAGYL) 500 MG tablet [id number] No Take 1 tablet (500 mg  total) by mouth 2 (two) times daily.  Patient not taking: Reported on 06/03/2023   Hannah Fillers, MD Not Taking Active   metroNIDAZOLE (METROGEL) 0.75 % vaginal gel [id number] No Place 1 Applicatorful vaginally at bedtime. Apply one applicatorful to vagina at bedtime for 5 days  Patient not taking: Reported on 06/03/2023   Hannah Newcomer, MD Not Taking Active            Patient Active Problem List   Diagnosis Date Noted   Irregular uterine bleeding 05/07/2023   Prediabetes 06/21/2019   Ear itch 03/30/2019   Tonsillar calculus 03/30/2019   Menorrhagia with regular cycle 12/28/2013   Anxiety 02/07/2013   Dysmenorrhea 02/07/2013   Conditions to be addressed/monitored per PCP order:  Chronic healthcare management needs, anxiety/depression, chronic pain, osteoarthritis, fibromyalgia, positive RNR, LBP, myofacial   Care Plan : RN Care Manager Plan Of Care  Updates made by Danie Chandler, RN since 06/03/2023 12:00 AM     Problem: Chronic Disease Management and Care Coordination Needs      Long-Range Goal: Development of Plan Of Care For Chronic Disease Management and Care Coordination Needs To Assist With Meeting Treatment Goals   Start Date: 04/15/2021  Expected End Date: 09/03/2023  Priority: High  Note:   Current Barriers:  Chronic Disease Management support and education needs related to weight concerns, depression/anxiety, chronic pain, DDD, osteoarthritis 06/03/23:  C/O of vaginal itching/irritation today-will f/u with GYN.  Monitoring menses.No other reported issues today.  RNCM Clinical Goal(s): Patient will verbalize basic understanding of weight management and depression disease process and self health management plan .  take all medications exactly as prescribed and will call provider for medication related questions attend all scheduled medical appointments: with primary care and specialist demonstrate ongoing adherence to prescribed treatment plan for  weight  management and depression as evidenced by exercise 5-7 days/week adherence to prescribed medication regimen contacting provider for new or worsened symptoms or questions . continue to work with RN Care Manager to address care management and care coordination needs related to weight management and depression work with Child psychotherapist to address depression through collaboration with Medical illustrator, provider, and care team.   Interventions Inter-disciplinary  care team collaboration (see longitudinal plan of care) Evaluation of current treatment plan related to  self management and patient's adherence to plan as established by provider Collaborated with LCSW LCSW referral for depression-completed Collaborated with BSW BSW referral for eye and dental providers at patient request-completed PCP sent message for nutrition referral-completed  Weight Loss:  (Status: Goal on track: NO.) Provided patient with contact information for managed Medicaid health plan customer service phone number about weight management benefits  05/13/21- Reviewed weight management benefits offered by patient's health plan and again encouraged her to call customer service for details  PreDiabetes:  (Status: Goal on track: NO.) Lab Results  Component Value Date   HGBA1C 5.7 (H) 12/06/2019   Assessed patient's understanding of A1c goal:  <5.7% Provided education to patient about basic prediabetes disease process; Provided written information on prediabetes eating plan Counseled on importance of regular laboratory monitoring as prescribed;        Discussed plans with patient for ongoing  care management follow up and provided patient with direct contact information for care management team      Pain:  (Status: New goal. Goal on track: YES.) Pain assessment performed Medications reviewed Reviewed provider established plan for pain management; Discussed importance of adherence to all scheduled medical  appointments; Counseled on the importance of reporting any/all new or changed pain symptoms or management strategies to pain management provider; Reviewed with patient prescribed pharmacological and nonpharmacological pain relief strategies; Provided written health plan information related to alternative healing benefits such as  massage and acupuncture  Patient Goals/Self-Care Activities: Patient will self administer medications as prescribed  Patient will attend all scheduled provider appointments  Patient will review educational material mailed to her on 04/15/21 Patient will continue to perform ADL's independently Patient will continue to perform IADL's independently Patient will call provider office for new concerns or questions Patient will follow up with provider regarding medication and weight management    Follow Up:  Patient agrees to Care Plan and Follow-up.  Plan: The Managed Medicaid care management team will reach out to the patient again over the next 30 business  days. and The  Patient has been provided with contact information for the Managed Medicaid care management team and has been advised to call with any health related questions or concerns.  Date/time of next scheduled RN care management/care coordination outreach: 08/03/23 at 1030

## 2023-06-03 NOTE — Patient Instructions (Signed)
[name], thank you for updating me today-have a great day!  Hannah Fleming was given information about Medicaid Managed Care team care coordination services as a part of their [facility] Community Plan Medicaid benefit. Hannah Fleming verbally consented to engagement with the [facility] Managed Care team.   If you are experiencing a medical emergency, please call 911 or report to your local emergency department or urgent care.   If you have a non-emergency medical problem during routine business hours, please contact your provider's office and ask to speak with a nurse.   For questions related to your [facility], please call: [phone] or visit the homepage here: [email or web address]  If you would like to schedule transportation through your [facility], please call the following number at least 2 days in advance of your appointment: [phone]   Rides for urgent appointments can also be made after hours by calling Member Services.  Call the Behavioral Health Crisis Line at [phone], at any time, 24 hours a day, 7 days a week. If you are in danger or need immediate medical attention call 911.  If you would like help to quit smoking, call 1-800-QUIT-NOW (562-198-7231) OR Espaol: 1-855-Djelo-Ya (1-324-401-0272) o para ms informacin haga clic aqu or Text READY to 536-644 to register via text  Hannah Fleming - following are the goals we discussed in your visit today:   Goals Addressed             This Visit's Progress    Cope with Chronic Pain       Timeframe:  Long-Range Goal Priority:  Medium Start Date:     04/15/21                        Expected End Date:    ongoing                   Follow Up Date 08/03/23   - learn relaxation techniques - practice acceptance of chronic pain - practice relaxation or meditation daily - spend time with positive people -  think of new ways to do favorite things - use distraction techniques - use relaxation during pain  - review written information mailed to me on chronic pain   Why is this important?   Stress makes chronic pain feel worse.  Feelings like depression, anxiety, stress and anger can make your body more sensitive to pain.  Learning ways to cope with stress or depression may help you find some relief from the pain.   06/03/23:  No complaints today, no upcoming appts   Patient verbalizes understanding of instructions and care plan provided today and agrees to view in MyChart. Active MyChart status and patient understanding of how to access instructions and care plan via MyChart confirmed with patient.     The Managed Medicaid care management team will reach out to the patient again over the next 60 business days.  The  Patient  has been provided with contact information for the Managed Medicaid care management team and has been advised to call with any health related questions or concerns.   Kathi Der RN, BSN [place]  [facility] Care Management Coordinator - Managed Medicaid High Risk [phone]   Following is a copy of your plan of care:  Care Plan : RN Care Manager Plan Of Care  Updates made by Danie Chandler, RN since 06/03/2023 12:00 AM  Problem: Chronic Disease Management and Care Coordination Needs      Long-Range Goal: Development of Plan Of Care For Chronic Disease Management and Care Coordination Needs To Assist With Meeting Treatment Goals   Start Date: 04/15/2021  Expected End Date: 09/03/2023  Priority: High  Note:   Current Barriers:  Chronic Disease Management support and education needs related to weight concerns, depression/anxiety, chronic pain, DDD, osteoarthritis 06/03/23:  C/O of vaginal itching/irritation today-will f/u with GYN.  Monitoring menses.No other reported issues today.  RNCM Clinical Goal(s): Patient will verbalize basic  understanding of weight management and depression disease process and self health management plan .  take all medications exactly as prescribed and will call provider for medication related questions attend all scheduled medical appointments: with primary care and specialist demonstrate ongoing adherence to prescribed treatment plan for  weight management and depression as evidenced by exercise 5-7 days/week adherence to prescribed medication regimen contacting provider for new or worsened symptoms or questions . continue to work with RN Care Manager to address care management and care coordination needs related to weight management and depression work with Child psychotherapist to address depression through collaboration with Medical illustrator, provider, and care team.   Interventions Inter-disciplinary  care team collaboration (see longitudinal plan of care) Evaluation of current treatment plan related to  self management and patient's adherence to plan as established by provider Collaborated with LCSW LCSW referral for depression-completed Collaborated with BSW BSW referral for eye and dental providers at patient request-completed PCP sent message for nutrition referral-completed  Weight Loss:  (Status: Goal on track: NO.) Provided patient with contact information for managed Medicaid health plan customer service phone number about weight management benefits  05/13/21- Reviewed weight management benefits offered by patient's health plan and again encouraged her to call customer service for details  PreDiabetes:  (Status: Goal on track: NO.) Lab Results  Component Value Date   HGBA1C 5.7 (H) 12/06/2019   Assessed patient's understanding of A1c goal:  <5.7% Provided education to patient about basic prediabetes disease process; Provided written information on prediabetes eating plan Counseled on importance of regular laboratory monitoring as prescribed;        Discussed plans with patient for  ongoing care management follow up and provided patient with direct contact information for care management team      Pain:  (Status: New goal. Goal on track: YES.) Pain assessment performed Medications reviewed Reviewed provider established plan for pain management; Discussed importance of adherence to all scheduled medical appointments; Counseled on the importance of reporting any/all new or changed pain symptoms or management strategies to pain management provider; Reviewed with patient prescribed pharmacological and nonpharmacological pain relief strategies; Provided written health plan information related to alternative healing benefits such as  massage and acupuncture  Patient Goals/Self-Care Activities: Patient will self administer medications as prescribed  Patient will attend all scheduled provider appointments  Patient will review educational material mailed to her on 04/15/21 Patient will continue to perform ADL's independently Patient will continue to perform IADL's independently Patient will call provider office for new concerns or questions Patient will follow up with provider regarding medication and weight management

## 2023-06-06 ENCOUNTER — Other Ambulatory Visit: Payer: Self-pay

## 2023-06-06 ENCOUNTER — Encounter (HOSPITAL_BASED_OUTPATIENT_CLINIC_OR_DEPARTMENT_OTHER): Payer: Self-pay

## 2023-06-06 ENCOUNTER — Emergency Department (HOSPITAL_BASED_OUTPATIENT_CLINIC_OR_DEPARTMENT_OTHER): Payer: No Typology Code available for payment source | Admitting: Radiology

## 2023-06-06 DIAGNOSIS — M542 Cervicalgia: Secondary | ICD-10-CM | POA: Diagnosis not present

## 2023-06-06 DIAGNOSIS — Y92410 Unspecified street and highway as the place of occurrence of the external cause: Secondary | ICD-10-CM | POA: Diagnosis not present

## 2023-06-06 DIAGNOSIS — Z87891 Personal history of nicotine dependence: Secondary | ICD-10-CM | POA: Insufficient documentation
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
## 2023-06-07 ENCOUNTER — Emergency Department (HOSPITAL_BASED_OUTPATIENT_CLINIC_OR_DEPARTMENT_OTHER)
Admission: EM | Admit: 2023-06-07 | Discharge: 2023-06-07 | Disposition: A | Discharge status: Home / Self Care | Payer: No Typology Code available for payment source | Attending: Emergency Medicine | Admitting: Emergency Medicine

## 2023-06-07 DIAGNOSIS — M542 Cervicalgia: Secondary | ICD-10-CM

## 2023-06-07 MED ORDER — NAPROXEN 500 MG PO TABS: 500.0000 mg | ORAL_TABLET | Freq: Two times a day (BID) | ORAL | 0 refills | Status: DC

## 2023-06-07 MED ORDER — METHOCARBAMOL 500 MG PO TABS: 500.0000 mg | ORAL_TABLET | Freq: Three times a day (TID) | ORAL | 0 refills | Status: DC | PRN

## 2023-06-07 MED ORDER — KETOROLAC TROMETHAMINE 15 MG/ML IJ SOLN
15.0000 mg | Freq: Once | INTRAMUSCULAR | Status: AC
  Administered 2023-06-07: 15 mg via INTRAMUSCULAR
  Filled 2023-06-07: qty 1
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
## 2023-06-10 ENCOUNTER — Ambulatory Visit: Payer: Self-pay | Admitting: *Deleted

## 2023-06-10 NOTE — Telephone Encounter (Signed)
Message from Phill Myron sent at 06/10/2023  1:47 PM EDT  Summary: Vaginal discharge, itching   Vaginal itching, discharge and irration, just finished medication last week. Symptoms are still there          Call History  Contact Date/Time Type Contact Phone/Fax User  06/10/2023 01:44 PM EDT Phone (Incoming) Hannah Fleming, Hannah Fleming (Self) [phone] Judie PetitLillia Mountain   Reason for Disposition  [1] Symptoms of a "yeast infection" (i.e., itchy, white discharge, not bad smelling) AND [2] not improved > 3 days following Care Advice  Answer Assessment - Initial Assessment Questions 1. DISCHARGE: "Describe the discharge." (e.g., white, yellow, green, gray, foamy, cottage cheese-like)     I had a yeast infection and I took Flyagy     I then got BV.   The gel was given to me.   I used all of it.   I still having clumpy white discharge, with itching and irritation.  2. ODOR: "Is there a bad odor?"     No odor 3. ONSET: "When did the discharge begin?"     Not sexual active. Began around Sept 3rd week. 4. RASH: "Is there a rash in the genital area?" If Yes, ask: "Describe it." (e.g., redness, blisters, sores, bumps)     Yes it's irritated  5. ABDOMEN PAIN: "Are you having any abdomen pain?" If Yes, ask: "What does it feel like? " (e.g., crampy, dull, intermittent, constant)      No 6. ABDOMEN PAIN SEVERITY: If present, ask: "How bad is it?" (e.g., Scale 1-10; mild, moderate, or severe)   - MILD (1-3): Doesn't interfere with normal activities, abdomen soft and not tender to touch.    - MODERATE (4-7): Interferes with normal activities or awakens from sleep, abdomen tender to touch.    - SEVERE (8-10): Excruciating pain, doubled over, unable to do any normal activities. (R/O peritonitis)      It's so irritating 7. CAUSE: "What do you think is causing the discharge?" "Have you had the same problem before? What happened then?"     I'm still having the symptoms of vaginal  infection 8. OTHER SYMPTOMS: "Do you have any other symptoms?" (e.g., fever, itching, vaginal bleeding, pain with urination, injury to genital area, vaginal foreign body)     See above 9. PREGNANCY: "Is there any chance you are pregnant?" "When was your last menstrual period?"     Not asked  Protocols used: Vaginal Discharge-A-AH

## 2023-06-10 NOTE — Telephone Encounter (Signed)
  Chief Complaint: Vaginal infection that did not go away on first round of medication. Symptoms: White, clump discharge, itching, irritation and burning.   Her GYN Dr. Donavan Foil treated her for this.   She wasn't sure whether to call him back or call Dr. Andrey Campanile.   I let her know to call him back since he is treating you already.   He may be able to just call something else in whereas Dr Andrey Campanile is going to need to see you.   Her earliest appt isn't until Nov.  Frequency: The infection never went completely away Pertinent Negatives: Patient denies it having a an odor Disposition: [] ED /[] Urgent Care (no appt availability in office) / [x] Appointment(In office/virtual)/ []  [facility]/ [] Home Care/ [] Refused Recommended Disposition /[] [facility]/ []  Follow-up with PCP Additional Notes: Pt agreeable to calling her GYN back.

## 2023-06-14 ENCOUNTER — Ambulatory Visit
Admission: EM | Admit: 2023-06-14 | Discharge: 2023-06-14 | Disposition: A | Discharge status: Home / Self Care | Payer: Medicaid Other | Attending: Family Medicine | Admitting: Family Medicine

## 2023-06-14 ENCOUNTER — Encounter: Payer: Self-pay | Admitting: Emergency Medicine

## 2023-06-14 ENCOUNTER — Other Ambulatory Visit: Payer: Self-pay

## 2023-06-14 DIAGNOSIS — R110 Nausea: Secondary | ICD-10-CM

## 2023-06-14 DIAGNOSIS — R030 Elevated blood-pressure reading, without diagnosis of hypertension: Secondary | ICD-10-CM | POA: Diagnosis not present

## 2023-06-14 DIAGNOSIS — N898 Other specified noninflammatory disorders of vagina: Secondary | ICD-10-CM

## 2023-06-14 DIAGNOSIS — R42 Dizziness and giddiness: Secondary | ICD-10-CM | POA: Diagnosis not present

## 2023-06-14 DIAGNOSIS — R5383 Other fatigue: Secondary | ICD-10-CM | POA: Diagnosis not present

## 2023-06-14 DIAGNOSIS — R8290 Unspecified abnormal findings in urine: Secondary | ICD-10-CM | POA: Diagnosis not present

## 2023-06-14 DIAGNOSIS — G43809 Other migraine, not intractable, without status migrainosus: Secondary | ICD-10-CM | POA: Diagnosis not present

## 2023-06-14 DIAGNOSIS — B379 Candidiasis, unspecified: Secondary | ICD-10-CM

## 2023-06-14 LAB — POCT URINALYSIS DIP (MANUAL ENTRY)
Bilirubin, UA: NEGATIVE
Glucose, UA: NEGATIVE mg/dL
Ketones, POC UA: NEGATIVE mg/dL
Nitrite, UA: NEGATIVE
Protein Ur, POC: 30 mg/dL — AB
Spec Grav, UA: >=1.03 — AB (ref 1.010–1.025)
Urobilinogen, UA: 1 U/dL
pH, UA: 6 (ref 5.0–8.0)

## 2023-06-14 LAB — POCT FASTING CBG KUC MANUAL ENTRY: POCT Glucose (KUC): 106 mg/dL — AB (ref 70–99)

## 2023-06-14 MED ORDER — ONDANSETRON HCL 4 MG PO TABS: 4.0000 mg | ORAL_TABLET | Freq: Four times a day (QID) | ORAL | 0 refills | Status: DC

## 2023-06-14 MED ORDER — FLUCONAZOLE 150 MG PO TABS
150.0000 mg | ORAL_TABLET | Freq: Once | ORAL | 0 refills | Status: AC
Start: 2023-06-14 — End: 2023-06-14

## 2023-06-14 MED ORDER — ONDANSETRON HCL 4 MG/2ML IJ SOLN
4.0000 mg | Freq: Once | INTRAMUSCULAR | Status: AC
  Administered 2023-06-14: 4 mg via INTRAMUSCULAR

## 2023-06-14 NOTE — ED Provider Notes (Signed)
[name]    CSN: [id number] Arrival date & time: 06/14/23  6962      History   Chief Complaint Chief Complaint  Patient presents with   Dizziness    HPI Hannah Fleming is a 42 y.o. female.    Dizziness Associated symptoms: headaches and nausea   Patient is here for not feeling well.  Saturday she went to the fair.  She did a lot of walking.  She has fibromyaglia, and took tylenol while there due to body aches.  She woke up at 3am with a "migraine".  She got up and felt a bit dizzy, took 800mg  motrin.  She woke up Sunday after feeling dizzy, felt poorly with headache.  She had a headache all day yesterday and laid around the house.  No appetite.  This morning she felt better, but felt more light headed and dizzy.  Left the store.   She had a dx of migraines years ago, but not longer takes mediations for it.  She has recently been having more headaches.   This is the 3rd episode of severe headache, dizziness and nausea in the last 3 months.  They are coming more and more.  This is the pattern, having severe migraine, then feeling wiped out the next day, dizziness, nausea.   Last night her bp was 131/118.  She has checked her bp in the past during headaches, but has never checked her bp when without a headache.   She does have a pcp.  She was taken off of birth control to help with blood pressure.    She states she has been drinking a lot of water since yesterday.  Had coffee this morning.  Otherwise no much appetite.   She had yeast/BV recently.  She has had vaginal irritation for about a week.  She has a tooth infection, given amoxil for this.  She finished this, and wonders if that caused a yeast infection. She did call her ob/gyn and sent out medication today.       Past Medical History:  Diagnosis Date   Arthritis    RA   Fibroids    Genital herpes    no outbreak in 85yrs   Trichomonas infection    Urticaria     Patient Active Problem List    Diagnosis Date Noted   Irregular uterine bleeding 05/07/2023   Prediabetes 06/21/2019   Ear itch 03/30/2019   Tonsillar calculus 03/30/2019   Menorrhagia with regular cycle 12/28/2013   Anxiety 02/07/2013   Dysmenorrhea 02/07/2013    Past Surgical History:  Procedure Laterality Date   DILATATION & CURETTAGE/HYSTEROSCOPY WITH MYOSURE N/A 10/22/2021   Procedure: DILATATION & CURETTAGE/HYSTEROSCOPY WITH MYOSURE;  Surgeon: [name], MD;  Location: [facility];  Service: Gynecology;  Laterality: N/A;   DILATION AND CURETTAGE OF UTERUS  2020   DILATION AND EVACUATION N/A 07/09/2022   Procedure: DILATATION AND EVACUATION;  Surgeon: Catalina Antigua, MD;  Location: [facility] OR;  Service: Gynecology;  Laterality: N/A;   INDUCED ABORTION     WISDOM TOOTH EXTRACTION      OB History     Gravida  9   Para  3   Term  3   Preterm  0   AB  5   Living  3      SAB  1   IAB  3   Ectopic  0   Multiple  0   Live Births  3  Home Medications    Prior to Admission medications   Medication Sig Start Date End Date Taking? Authorizing Provider  Brexpiprazole (REXULTI PO) Take by mouth.   Yes [provider]  DULoxetine (CYMBALTA) 20 MG capsule TAKE 1 CAPSULE BY MOUTH EVERY DAY 02/01/23  Yes Zonia Kief, Amy J, NP  methocarbamol (ROBAXIN) 500 MG tablet Take 1 tablet (500 mg total) by mouth every 8 (eight) hours as needed for muscle spasms. 06/07/23  Yes Sabas Sous, MD  naproxen (NAPROSYN) 500 MG tablet Take 1 tablet (500 mg total) by mouth 2 (two) times daily. 06/07/23  Yes Sabas Sous, MD  acetaminophen (TYLENOL) 500 MG tablet Take 1,000 mg by mouth every 6 (six) hours as needed for mild pain or moderate pain.    [provider]  benzonatate (TESSALON) 100 MG capsule Take 1 capsule (100 mg total) by mouth every 8 (eight) hours. 04/06/23   Peter Garter, PA  DULoxetine (CYMBALTA) 30 MG capsule Take 1 capsule (30 mg total) by  mouth daily. 02/09/23   [name], MD  fluconazole (DIFLUCAN) 150 MG tablet Take 1 tablet (150 mg total) by mouth once for 1 dose. 06/14/23 06/14/23  Constant, Peggy, MD  ibuprofen (ADVIL) 600 MG tablet Take 1 tablet (600 mg total) by mouth every 6 (six) hours as needed. 04/06/23   Peter Garter, PA  metroNIDAZOLE (FLAGYL) 500 MG tablet Take 1 tablet (500 mg total) by mouth 2 (two) times daily. Patient not taking: Reported on 06/03/2023 05/13/23   [name], MD  metroNIDAZOLE (METROGEL) 0.75 % vaginal gel Place 1 Applicatorful vaginally at bedtime. Apply one applicatorful to vagina at bedtime for 5 days Patient not taking: Reported on 06/03/2023 05/17/23   Anyanwu, Jethro Bastos, MD    Family History Family History  Problem Relation Age of Onset   Healthy Mother    Sickle cell trait Mother    Healthy Father    Sickle cell trait Sister    Sickle cell trait Brother    Healthy Daughter    Healthy Son    Healthy Son    Other Neg Hx     Social History Social History   Tobacco Use   Smoking status: Former    Current packs/day: 0.00    Average packs/day: 1 pack/day for 4.0 years (4.0 ttl pk-yrs)    Types: Cigarettes    Start date: 02/07/2005    Quit date: 02/07/2009    Years since quitting: 14.3    Passive exposure: Never   Smokeless tobacco: Never  Vaping Use   Vaping status: Never Used  Substance Use Topics   Alcohol use: Yes    Comment: OCC   Drug use: No     Allergies   Prilosec otc [omeprazole magnesium] and Latex   Review of Systems Review of Systems  Constitutional:  Positive for fatigue.  HENT: Negative.    Respiratory: Negative.    Cardiovascular: Negative.   Gastrointestinal:  Positive for nausea.  Genitourinary:  Positive for vaginal discharge.  Musculoskeletal: Negative.   Neurological:  Positive for dizziness and headaches.  Psychiatric/Behavioral: Negative.       Physical Exam Triage Vital Signs ED Triage Vitals  Encounter Vitals Group      BP 06/14/23 1006 (!) 134/95     Systolic BP Percentile --      Diastolic BP Percentile --      Pulse Rate 06/14/23 1006 80     Resp 06/14/23 1006 18  Temp 06/14/23 1006 97.7 F (36.5 C)     Temp Source 06/14/23 1006 Oral     SpO2 06/14/23 1006 97 %     Weight 06/14/23 1008 208 lb (94.3 kg)     Height 06/14/23 1008 5\' 4"  (1.626 m)     Head Circumference --      Peak Flow --      Pain Score 06/14/23 1008 0     Pain Loc --      Pain Education --      Exclude from Growth Chart --    No data found.  Updated Vital Signs BP (!) 134/95 (BP Location: Right Arm)   Pulse 80   Temp 97.7 F (36.5 C) (Oral)   Resp 18   Ht 5\' 4"  (1.626 m)   Wt 94.3 kg   LMP 06/01/2023 (Exact Date)   SpO2 97%   Breastfeeding No   BMI 35.70 kg/m   Visual Acuity Right Eye Distance:   Left Eye Distance:   Bilateral Distance:    Right Eye Near:   Left Eye Near:    Bilateral Near:     Physical Exam Constitutional:      Appearance: Normal appearance. She is not ill-appearing.     Comments: Appears fatigued  HENT:     Nose: Nose normal.     Mouth/Throat:     Mouth: Mucous membranes are moist.  Eyes:     Extraocular Movements: Extraocular movements intact.     Pupils: Pupils are equal, round, and reactive to light.  Cardiovascular:     Rate and Rhythm: Normal rate and regular rhythm.  Pulmonary:     Effort: Pulmonary effort is normal.     Breath sounds: Normal breath sounds.  Abdominal:     Palpations: Abdomen is soft.  Musculoskeletal:        General: Normal range of motion.     Cervical back: Normal range of motion.  Neurological:     General: No focal deficit present.     Mental Status: She is alert and oriented to person, place, and time.     Cranial Nerves: No cranial nerve deficit.     Sensory: No sensory deficit.     Motor: No weakness.  Psychiatric:        Mood and Affect: Mood normal.      UC Treatments / Results  Labs (all labs ordered are listed, but only abnormal  results are displayed) Labs Reviewed  POCT URINALYSIS DIP (MANUAL ENTRY) - Abnormal; Notable for the following components:      Result Value   Clarity, UA cloudy (*)    Spec Grav, UA >=1.030 (*)    Blood, UA small (*)    Protein Ur, POC =30 (*)    Leukocytes, UA Small (1+) (*)    All other components within normal limits  POCT FASTING CBG [facility] MANUAL ENTRY - Abnormal; Notable for the following components:   POCT Glucose ([facility]) 106 (*)    All other components within normal limits  URINE CULTURE  CERVICOVAGINAL ANCILLARY ONLY    EKG   Radiology No results found.  Procedures Procedures (including critical care time)  Medications Ordered in UC Medications  ondansetron (ZOFRAN) injection 4 mg (4 mg Intramuscular Given 06/14/23 1046)    Initial Impression / Assessment and Plan / UC Course  I have reviewed the triage vital signs and the nursing notes.  Pertinent labs & imaging results that were available during my care of  the patient were reviewed by me and considered in my medical decision making (see chart for details).   Final Clinical Impressions(s) / UC Diagnoses   Final diagnoses:  Nausea without vomiting  Other fatigue  Dizziness  Vaginal discharge  Other migraine without status migrainosus, not intractable  Elevated blood pressure reading  Abnormal urine     Discharge Instructions      You were seen today for various issues.  It appears you are having migraines, and the fatigue and dizziness is part of this.  I do, however, recommend you follow up with your primary care provider to discuss this further and discuss treatment options.  Your blood pressure is elevated today, however, I will not start any blood pressure medication.  You need to follow up with your doctor for further discussion about this as well.  Please monitory your blood pressure at home.  I have given you medication to help with nausea today, and sent this to your pharmacy.   Your blood sugar  was normal.  I will send your urine to the lab for further testing and will treat if needed.  We have done the vaginal swab, which will be resulted tomorrow and you should follow up with your ob/gyn about this if needed.  I recommend you go home and get plenty of rest and fluids.  If you are having worsening symptoms then please follow up or go to the ER for further evaluation.     ED Prescriptions     Medication Sig Dispense Auth. Provider   ondansetron (ZOFRAN) 4 MG tablet Take 1 tablet (4 mg total) by mouth every 6 (six) hours. 12 tablet Jannifer Franklin, MD      PDMP not reviewed this encounter.   Jannifer Franklin, MD 06/14/23 1050

## 2023-06-14 NOTE — Discharge Instructions (Addendum)
You were seen today for various issues.  It appears you are having migraines, and the fatigue and dizziness is part of this.  I do, however, recommend you follow up with your primary care provider to discuss this further and discuss treatment options.  Your blood pressure is elevated today, however, I will not start any blood pressure medication.  You need to follow up with your doctor for further discussion about this as well.  Please monitory your blood pressure at home.  I have given you medication to help with nausea today, and sent this to your pharmacy.   Your blood sugar was normal.  I will send your urine to the lab for further testing and will treat if needed.  We have done the vaginal swab, which will be resulted tomorrow and you should follow up with your ob/gyn about this if needed.  I recommend you go home and get plenty of rest and fluids.  If you are having worsening symptoms then please follow up or go to the ER for further evaluation.

## 2023-06-14 NOTE — ED Triage Notes (Signed)
Patient c/o headache x 1 day, feeling drained, dizziness today.  Took her blood pressure yesterday 131/118, no history of elevated BP.  Patient has taken Tylenol and Ibuprofen.

## 2023-06-15 ENCOUNTER — Telehealth: Payer: Self-pay

## 2023-06-15 LAB — CERVICOVAGINAL ANCILLARY ONLY
Bacterial Vaginitis (gardnerella): POSITIVE — AB
Candida Glabrata: NEGATIVE
Candida Vaginitis: POSITIVE — AB
Chlamydia: NEGATIVE
Comment: NEGATIVE
Comment: NEGATIVE
Comment: NEGATIVE
Comment: NEGATIVE
Comment: NEGATIVE
Comment: NORMAL
Neisseria Gonorrhea: NEGATIVE
Trichomonas: NEGATIVE

## 2023-06-15 MED ORDER — METRONIDAZOLE 500 MG PO TABS: 500.0000 mg | ORAL_TABLET | Freq: Two times a day (BID) | ORAL | 0 refills | Status: AC

## 2023-06-15 MED ORDER — FLUCONAZOLE 150 MG PO TABS: 150.0000 mg | ORAL_TABLET | Freq: Once | ORAL | 0 refills | Status: AC

## 2023-06-15 NOTE — Telephone Encounter (Signed)
Per protocol, pt requires tx with metronidazole and Diflucan.  Attempted to reach patient x1. Unable to LVM.  Rx sent to pharmacy on file.

## 2023-06-16 LAB — URINE CULTURE

## 2023-07-08 ENCOUNTER — Telehealth: Payer: Self-pay | Admitting: *Deleted

## 2023-07-08 NOTE — Telephone Encounter (Signed)
Okay to refer to chiropractor of her choice.  She may be able to schedule an appointment with the chiropractor directly without a referral.

## 2023-07-08 NOTE — Telephone Encounter (Signed)
I called patient, patient will call to schedule appt.
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
## 2023-08-03 ENCOUNTER — Ambulatory Visit: Payer: Self-pay | Admitting: Obstetrics and Gynecology

## 2023-08-11 ENCOUNTER — Other Ambulatory Visit: Payer: Self-pay | Admitting: Obstetrics and Gynecology
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
## 2023-09-09 ENCOUNTER — Ambulatory Visit: Payer: Medicaid Other | Attending: Rheumatology | Admitting: Rheumatology

## 2023-09-09 ENCOUNTER — Encounter: Payer: Self-pay | Admitting: Rheumatology

## 2023-09-09 ENCOUNTER — Ambulatory Visit: Payer: Medicaid Other

## 2023-09-09 VITALS — BP 129/88 | HR 73 | Resp 16 | Ht 64.0 in | Wt 211.0 lb

## 2023-09-09 DIAGNOSIS — M19042 Primary osteoarthritis, left hand: Secondary | ICD-10-CM

## 2023-09-09 DIAGNOSIS — M797 Fibromyalgia: Secondary | ICD-10-CM | POA: Insufficient documentation

## 2023-09-09 DIAGNOSIS — M19071 Primary osteoarthritis, right ankle and foot: Secondary | ICD-10-CM | POA: Diagnosis not present

## 2023-09-09 DIAGNOSIS — F419 Anxiety disorder, unspecified: Secondary | ICD-10-CM

## 2023-09-09 DIAGNOSIS — M4126 Other idiopathic scoliosis, lumbar region: Secondary | ICD-10-CM

## 2023-09-09 DIAGNOSIS — M19041 Primary osteoarthritis, right hand: Secondary | ICD-10-CM

## 2023-09-09 DIAGNOSIS — L659 Nonscarring hair loss, unspecified: Secondary | ICD-10-CM

## 2023-09-09 DIAGNOSIS — N946 Dysmenorrhea, unspecified: Secondary | ICD-10-CM

## 2023-09-09 DIAGNOSIS — M62838 Other muscle spasm: Secondary | ICD-10-CM | POA: Diagnosis not present

## 2023-09-09 DIAGNOSIS — M5442 Lumbago with sciatica, left side: Secondary | ICD-10-CM

## 2023-09-09 DIAGNOSIS — R7303 Prediabetes: Secondary | ICD-10-CM

## 2023-09-09 DIAGNOSIS — M5441 Lumbago with sciatica, right side: Secondary | ICD-10-CM | POA: Diagnosis not present

## 2023-09-09 DIAGNOSIS — M7062 Trochanteric bursitis, left hip: Secondary | ICD-10-CM

## 2023-09-09 DIAGNOSIS — M47816 Spondylosis without myelopathy or radiculopathy, lumbar region: Secondary | ICD-10-CM | POA: Diagnosis not present

## 2023-09-09 DIAGNOSIS — N96 Recurrent pregnancy loss: Secondary | ICD-10-CM

## 2023-09-09 DIAGNOSIS — G8929 Other chronic pain: Secondary | ICD-10-CM

## 2023-09-09 DIAGNOSIS — M19072 Primary osteoarthritis, left ankle and foot: Secondary | ICD-10-CM

## 2023-09-09 DIAGNOSIS — R768 Other specified abnormal immunological findings in serum: Secondary | ICD-10-CM | POA: Diagnosis not present

## 2023-09-09 DIAGNOSIS — M170 Bilateral primary osteoarthritis of knee: Secondary | ICD-10-CM | POA: Diagnosis not present

## 2023-09-09 DIAGNOSIS — M79672 Pain in left foot: Secondary | ICD-10-CM | POA: Diagnosis not present

## 2023-09-09 DIAGNOSIS — M7061 Trochanteric bursitis, right hip: Secondary | ICD-10-CM | POA: Diagnosis not present

## 2023-09-11 LAB — CBC WITH DIFFERENTIAL/PLATELET
Absolute Lymphocytes: 3424 {cells}/uL (ref 850–3900)
Absolute Monocytes: 706 {cells}/uL (ref 200–950)
Basophils Absolute: 54 {cells}/uL (ref 0–200)
Basophils Relative: 0.5 %
Eosinophils Absolute: 193 {cells}/uL (ref 15–500)
Eosinophils Relative: 1.8 %
HCT: 38.7 % (ref 35.0–45.0)
Hemoglobin: 12.6 g/dL (ref 11.7–15.5)
MCH: 29 pg (ref 27.0–33.0)
MCHC: 32.6 g/dL (ref 32.0–36.0)
MCV: 89.2 fL (ref 80.0–100.0)
MPV: 11.5 fL (ref 7.5–12.5)
Monocytes Relative: 6.6 %
Neutro Abs: 6324 {cells}/uL (ref 1500–7800)
Neutrophils Relative %: 59.1 %
Platelets: 288 10*3/uL (ref 140–400)
RBC: 4.34 10*6/uL (ref 3.80–5.10)
RDW: 12.5 % (ref 11.0–15.0)
Total Lymphocyte: 32 %
WBC: 10.7 10*3/uL (ref 3.8–10.8)

## 2023-09-11 LAB — COMPLETE METABOLIC PANEL WITH GFR
AG Ratio: 1.8 (calc) (ref 1.0–2.5)
ALT: 10 U/L (ref 6–29)
AST: 12 U/L (ref 10–30)
Albumin: 4.6 g/dL (ref 3.6–5.1)
Alkaline phosphatase (APISO): 68 U/L (ref 31–125)
BUN: 16 mg/dL (ref 7–25)
CO2: 27 mmol/L (ref 20–32)
Calcium: 10 mg/dL (ref 8.6–10.2)
Chloride: 101 mmol/L (ref 98–110)
Creat: 0.96 mg/dL (ref 0.50–0.99)
Globulin: 2.6 g/dL (ref 1.9–3.7)
Glucose, Bld: 82 mg/dL (ref 65–99)
Potassium: 4.3 mmol/L (ref 3.5–5.3)
Sodium: 135 mmol/L (ref 135–146)
Total Bilirubin: 0.2 mg/dL (ref 0.2–1.2)
Total Protein: 7.2 g/dL (ref 6.1–8.1)
eGFR: 76 mL/min/{1.73_m2} (ref >=60)

## 2023-09-11 LAB — ANTI-DNA ANTIBODY, DOUBLE-STRANDED: ds DNA Ab: <1 [IU]/mL

## 2023-09-11 LAB — PROTEIN / CREATININE RATIO, URINE
Creatinine, Urine: 88 mg/dL (ref 20–275)
Protein/Creat Ratio: 91 mg/g{creat} (ref 24–184)
Protein/Creatinine Ratio: 0.091 mg/mg{creat} (ref 0.024–0.184)
Total Protein, Urine: 8 mg/dL (ref 5–24)

## 2023-09-11 LAB — SEDIMENTATION RATE: Sed Rate: 17 mm/h (ref 0–20)

## 2023-09-11 LAB — ANA: Anti Nuclear Antibody (ANA): POSITIVE — AB

## 2023-09-11 LAB — C3 AND C4
C3 Complement: 161 mg/dL (ref 83–193)
C4 Complement: 34 mg/dL (ref 15–57)

## 2023-09-11 LAB — RNP ANTIBODY: Ribonucleic Protein(ENA) Antibody, IgG: 3 AI — AB

## 2023-09-11 LAB — ANTI-NUCLEAR AB-TITER (ANA TITER): ANA Titer 1: 1:40 {titer} — ABNORMAL HIGH

## 2023-09-14 ENCOUNTER — Other Ambulatory Visit: Payer: Self-pay | Admitting: Obstetrics and Gynecology

## 2023-09-14 NOTE — Patient Outreach (Signed)
Care Coordination  09/14/2023  Hannah Fleming 1981-05-19 [id number]  RNCM called patient at scheduled time-patient answered and asked to be called another time.  RNCM rescheduled appointment at patient request.    Kathi Der RN, BSN, CM [facility] RN Care Manager Direct Dial [phone]/QVZ [phone] Website: [email or web address]

## 2023-09-17 ENCOUNTER — Other Ambulatory Visit: Payer: Self-pay | Admitting: Obstetrics and Gynecology

## 2023-09-17 NOTE — Patient Outreach (Signed)
  Medicaid Managed Care   Unsuccessful Attempt Note   09/17/2023 Name: DANAH REINECKE MRN: [id number] DOB: November 22, 1980  Referred by: [name], MD Reason for referral : High Risk Managed Medicaid (Unsuccessful telephone outtreach)  An unsuccessful telephone outreach was attempted today. The patient was referred to the case management team for assistance with care management and care coordination.    Follow Up Plan: The Managed Medicaid care management team will reach out to the patient again over the next 30 business  days. and The  Patient has been provided with contact information for the Managed Medicaid care management team and has been advised to call with any health related questions or concerns.   Kathi Der RN, BSN, [employer] [facility] RN Care Manager Direct Dial [phone]/WGN [phone] Website: [email or web address]

## 2023-09-17 NOTE — Patient Instructions (Signed)
[name], I hope you are doing okay today, I am sorry I was unable to reach you - as a part of your Medicaid benefit, you are eligible for care management and care coordination services at no cost or copay. I was unable to reach you by phone today but would be happy to help you with your health related needs. Please feel free to call me at [phone].   A member of the Managed Medicaid care management team will reach out to you again over the next 30 business  days.   Kathi Der RN, BSN, [employer] [facility] RN Care Manager Direct Dial [phone]

## 2023-09-21 ENCOUNTER — Other Ambulatory Visit: Payer: Self-pay | Admitting: Obstetrics and Gynecology

## 2023-09-21 NOTE — Patient Instructions (Signed)
[name], sorry to miss you again today, hope you are okay - as a part of your Medicaid benefit, you are eligible for care management and care coordination services at no cost or copay. I was unable to reach you by phone today but would be happy to help you with your health related needs. Please feel free to call me at [phone].   A member of the Managed Medicaid care management team will reach out to you again over the next 30 business  days.   Kathi Der RN, BSN, [employer] [facility] RN Care Manager Direct Dial [phone]/JOA [phone] Website: [email or web address]

## 2023-09-21 NOTE — Patient Outreach (Signed)
  Medicaid Managed Care   Unsuccessful Attempt Note   09/21/2023 Name: Hannah Fleming MRN: [id number] DOB: [birth year]  Referred by: [name], MD Reason for referral : High Risk Managed Medicaid (Unsuccessful telephone outreach)  A second unsuccessful telephone outreach was attempted today. The patient was referred to the case management team for assistance with care management and care coordination.    Follow Up Plan: The Managed Medicaid care management team will reach out to the patient again over the next 30 business  days. and The  Patient has been provided with contact information for the Managed Medicaid care management team and has been advised to call with any health related questions or concerns.   Kathi Der RN, BSN, [employer] [facility] RN Care Manager Direct Dial [phone]/WGN [phone] Website: [email or web address]

## 2023-09-23 ENCOUNTER — Encounter: Payer: Self-pay | Admitting: Podiatry

## 2023-09-23 ENCOUNTER — Ambulatory Visit: Payer: Medicaid Other | Admitting: Podiatry

## 2023-09-23 ENCOUNTER — Ambulatory Visit (INDEPENDENT_AMBULATORY_CARE_PROVIDER_SITE_OTHER): Payer: Medicaid Other

## 2023-09-23 DIAGNOSIS — M722 Plantar fascial fibromatosis: Secondary | ICD-10-CM

## 2023-09-23 DIAGNOSIS — M7752 Other enthesopathy of left foot: Secondary | ICD-10-CM

## 2023-09-23 MED ORDER — TRIAMCINOLONE ACETONIDE 10 MG/ML IJ SUSP
10.0000 mg | Freq: Once | INTRAMUSCULAR | Status: AC
  Administered 2023-09-23: 10 mg via INTRA_ARTICULAR
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
## 2023-09-30 ENCOUNTER — Ambulatory Visit: Payer: Self-pay | Admitting: Family Medicine

## 2023-09-30 ENCOUNTER — Ambulatory Visit
Admission: EM | Admit: 2023-09-30 | Discharge: 2023-09-30 | Disposition: A | Discharge status: Home / Self Care | Payer: Medicaid Other | Attending: Physician Assistant | Admitting: Physician Assistant

## 2023-09-30 DIAGNOSIS — R197 Diarrhea, unspecified: Secondary | ICD-10-CM | POA: Diagnosis not present

## 2023-09-30 DIAGNOSIS — R110 Nausea: Secondary | ICD-10-CM | POA: Diagnosis present

## 2023-09-30 DIAGNOSIS — K0889 Other specified disorders of teeth and supporting structures: Secondary | ICD-10-CM | POA: Insufficient documentation

## 2023-09-30 LAB — POCT URINE PREGNANCY: Preg Test, Ur: NEGATIVE

## 2023-09-30 MED ORDER — ONDANSETRON 4 MG PO TBDP
4.0000 mg | ORAL_TABLET | Freq: Once | ORAL | Status: AC
  Administered 2023-09-30: 4 mg via ORAL

## 2023-09-30 MED ORDER — ONDANSETRON 4 MG PO TBDP: 4.0000 mg | ORAL_TABLET | Freq: Three times a day (TID) | ORAL | 0 refills | Status: DC | PRN

## 2023-09-30 NOTE — Telephone Encounter (Signed)
   Chief Complaint: diarrhea Symptoms: diarrhea, cramping Frequency: constant Pertinent Negatives: Patient denies fever, pain, blood in stool Disposition: [] ED /[x] Urgent Care (no appt availability in office) / [] Appointment(In office/virtual)/ []  [facility]/ [] Home Care/ [] Refused Recommended Disposition /[] [facility]/ []  Follow-up with PCP Additional Notes: states diarrhea started after receiving a steroid shot in foot.  The Monday prior to shot she was diagnosed with a tooth abscess and stopped taking the clindamycin antibiotic and diarrhea has continued.  No apts available for today.  Instructed to go to UC for treatment.  Care advice given, denies questions, pcp office updated.  Reason for Disposition  [1] SEVERE diarrhea (e.g., 7 or more times / day more than normal) AND [2] age > 60 years  Answer Assessment - Initial Assessment Questions 1. DIARRHEA SEVERITY: How bad is the diarrhea? How many more stools have you had in the past 24 hours than normal?    - NO DIARRHEA (SCALE 0)   - MILD (SCALE 1-3): Few loose or mushy BMs; increase of 1-3 stools over normal daily number of stools; mild increase in ostomy output.   -  MODERATE (SCALE 4-7): Increase of 4-6 stools daily over normal; moderate increase in ostomy output.   -  SEVERE (SCALE 8-10; OR WORST POSSIBLE): Increase of 7 or more stools daily over normal; moderate increase in ostomy output; incontinence.     Received an injection of steroids to her foot and diarrhea is severe. 2. ONSET: When did the diarrhea begin?      1/28 or 1/29 3. BM CONSISTENCY: How loose or watery is the diarrhea?      Soft stools, abd cramps 4. VOMITING: Are you also vomiting? If Yes, ask: How many times in the past 24 hours?      na 5. ABDOMEN PAIN: Are you having any abdomen pain? If Yes, ask: What does it feel like? (e.g., crampy, dull, intermittent, constant)      cramping 6. ABDOMEN PAIN SEVERITY: If present,  ask: How bad is the pain?  (e.g., Scale 1-10; mild, moderate, or severe)   - MILD (1-3): doesn't interfere with normal activities, abdomen soft and not tender to touch    - MODERATE (4-7): interferes with normal activities or awakens from sleep, abdomen tender to touch    - SEVERE (8-10): excruciating pain, doubled over, unable to do any normal activities       crampy 7. ORAL INTAKE: If vomiting, Have you been able to drink liquids? How much liquids have you had in the past 24 hours?     na 8. HYDRATION: Any signs of dehydration? (e.g., dry mouth [not just dry lips], too weak to stand, dizziness, new weight loss) When did you last urinate?     Keeping fluids down.  9. EXPOSURE: Have you traveled to a foreign country recently? Have you been exposed to anyone with diarrhea? Could you have eaten any food that was spoiled?     denies 10. ANTIBIOTIC USE: Are you taking antibiotics now or have you taken antibiotics in the past 2 months?       Yes, clindamycin for tooth abscess she stopped taking it. 11. OTHER SYMPTOMS: Do you have any other symptoms? (e.g., fever, blood in stool)       denies 12. PREGNANCY: Is there any chance you are pregnant? When was your last menstrual period?       na  Protocols used: [facility]

## 2023-09-30 NOTE — ED Provider Notes (Signed)
 [name]    CSN: [id number] Arrival date & time: 09/30/23  1618      History   Chief Complaint Chief Complaint  Patient presents with   Nausea   Dental Pain    HPI Hannah Fleming is a 43 y.o. female.   Patient here today for evaluation of loose stools that she has had for the last week.  She reports that she has had some nausea but no vomiting.  She notes that she had had a steroid injection into her foot and is not sure if this is related.  She denies any fever.  She also reports that she has had some dental pain and has recently been prescribed antibiotic for same.    The history is provided by the patient.  Dental Pain Associated symptoms: no fever     Past Medical History:  Diagnosis Date   Arthritis    RA   Fibroids    Genital herpes    no outbreak in 75yrs   Trichomonas infection    Urticaria     Patient Active Problem List   Diagnosis Date Noted   Primary osteoarthritis of both hands 09/09/2023   Primary osteoarthritis of both knees 09/09/2023   Primary osteoarthritis of both feet 09/09/2023   Arthropathy of lumbar facet joint 09/09/2023   Other idiopathic scoliosis, lumbar region 09/09/2023   Fibromyalgia syndrome 09/09/2023   History of multiple miscarriages 09/09/2023   Irregular uterine bleeding 05/07/2023   Prediabetes 06/21/2019   Ear itch 03/30/2019   Tonsillar calculus 03/30/2019   Menorrhagia with regular cycle 12/28/2013   Anxiety 02/07/2013   Dysmenorrhea 02/07/2013    Past Surgical History:  Procedure Laterality Date   DILATATION & CURETTAGE/HYSTEROSCOPY WITH MYOSURE N/A 10/22/2021   Procedure: DILATATION & CURETTAGE/HYSTEROSCOPY WITH MYOSURE;  Surgeon: Zina Jerilynn LABOR, MD;  Location: [facility];  Service: Gynecology;  Laterality: N/A;   DILATION AND CURETTAGE OF UTERUS  2020   DILATION AND EVACUATION N/A 07/09/2022   Procedure: DILATATION AND EVACUATION;  Surgeon: Alger Gong, MD;  Location: [facility]  OR;  Service: Gynecology;  Laterality: N/A;   INDUCED ABORTION     WISDOM TOOTH EXTRACTION      OB History     Gravida  9   Para  3   Term  3   Preterm  0   AB  5   Living  3      SAB  1   IAB  3   Ectopic  0   Multiple  0   Live Births  3            Home Medications    Prior to Admission medications   Medication Sig Start Date End Date Taking? Authorizing Provider  DULoxetine  (CYMBALTA ) 20 MG capsule TAKE 1 CAPSULE BY MOUTH EVERY DAY Patient taking differently: Take 20 mg by mouth daily. Last dose: 28th. 02/01/23  Yes Lorren, Amy J, NP  methocarbamol  (ROBAXIN ) 500 MG tablet Take 1 tablet (500 mg total) by mouth every 8 (eight) hours as needed for muscle spasms. 06/07/23  Yes Theadore Ozell HERO, MD  naproxen  (NAPROSYN ) 500 MG tablet Take 1 tablet (500 mg total) by mouth 2 (two) times daily. 06/07/23  Yes Theadore Ozell HERO, MD  ondansetron  (ZOFRAN -ODT) 4 MG disintegrating tablet Take 1 tablet (4 mg total) by mouth every 8 (eight) hours as needed. 09/30/23  Yes Billy Asberry FALCON, PA-C  Prenatal Vit-Fe Fumarate-FA (PRENATAL PO) Take by mouth.  Yes [provider]  VITAMIN A PO Take by mouth.   Yes [provider]  acetaminophen  (TYLENOL ) 500 MG tablet Take 1,000 mg by mouth every 6 (six) hours as needed for mild pain or moderate pain.    [provider]  amoxicillin  (AMOXIL ) 500 MG capsule Take by mouth 2 (two) times daily. 05/17/23   [provider]  Brexpiprazole (REXULTI PO) Take by mouth. Patient not taking: Reported on 09/09/2023    [provider]  clindamycin (CLEOCIN) 150 MG capsule Take by mouth 4 (four) times daily. 09/14/23   [provider]  fluconazole  (DIFLUCAN ) 150 MG tablet Take by mouth. 06/15/23   [provider]  ibuprofen  (ADVIL ) 600 MG tablet Take 1 tablet (600 mg total) by mouth every 6 (six) hours as needed. 04/06/23   Silver Wonda LABOR, PA  metroNIDAZOLE  (FLAGYL ) 500 MG tablet Take 1  tablet (500 mg total) by mouth 2 (two) times daily. Patient not taking: Reported on 06/03/2023 05/13/23   Zina Jerilynn LABOR, MD  metroNIDAZOLE  (METROGEL ) 0.75 % vaginal gel Place 1 Applicatorful vaginally at bedtime. Apply one applicatorful to vagina at bedtime for 5 days Patient not taking: Reported on 06/03/2023 05/17/23   Anyanwu, Gloris LABOR, MD  Multiple Vitamins-Minerals (ZINC PO) Take by mouth.    [provider]  ondansetron  (ZOFRAN ) 4 MG tablet Take 1 tablet (4 mg total) by mouth every 6 (six) hours. 06/14/23   Darral Longs, MD    Family History Family History  Problem Relation Age of Onset   Healthy Mother    Sickle cell trait Mother    Healthy Father    Sickle cell trait Sister    Sickle cell trait Brother    Healthy Daughter    Healthy Son    Healthy Son    Other Neg Hx     Social History Social History   Tobacco Use   Smoking status: Former    Current packs/day: 0.00    Average packs/day: 1 pack/day for 4.0 years (4.0 ttl pk-yrs)    Types: Cigarettes    Start date: 02/07/2005    Quit date: 02/07/2009    Years since quitting: 14.6    Passive exposure: Never   Smokeless tobacco: Never  Vaping Use   Vaping status: Never Used  Substance Use Topics   Alcohol use: Yes    Comment: Occassionally.   Drug use: No     Allergies   Prilosec otc [omeprazole magnesium] and Latex   Review of Systems Review of Systems  Constitutional:  Negative for chills and fever.  Eyes:  Negative for discharge and redness.  Respiratory:  Negative for shortness of breath.   Gastrointestinal:  Positive for diarrhea and nausea. Negative for abdominal pain and vomiting.     Physical Exam Triage Vital Signs ED Triage Vitals  Encounter Vitals Group     BP 09/30/23 1820 (!) 139/94     Systolic BP Percentile --      Diastolic BP Percentile --      Pulse Rate 09/30/23 1820 80     Resp 09/30/23 1820 18     Temp 09/30/23 1820 98.2 F (36.8 C)     Temp Source 09/30/23 1820  Oral     SpO2 09/30/23 1820 98 %     Weight 09/30/23 1816 210 lb 15.7 oz (95.7 kg)     Height 09/30/23 1816 5' 4 (1.626 m)     Head Circumference --  Peak Flow --      Pain Score 09/30/23 1813 0     Pain Loc --      Pain Education --      Exclude from Growth Chart --    No data found.  Updated Vital Signs BP (!) 139/94 (BP Location: Left Arm)   Pulse 80   Temp 98.2 F (36.8 C) (Oral)   Resp 18   Ht 5' 4 (1.626 m)   Wt 210 lb 15.7 oz (95.7 kg)   LMP 09/12/2023 (Exact Date)   SpO2 98%   BMI 36.21 kg/m   Visual Acuity Right Eye Distance:   Left Eye Distance:   Bilateral Distance:    Right Eye Near:   Left Eye Near:    Bilateral Near:     Physical Exam Vitals and nursing note reviewed.  Constitutional:      General: She is not in acute distress.    Appearance: Normal appearance. She is not ill-appearing.  HENT:     Head: Normocephalic and atraumatic.  Eyes:     Conjunctiva/sclera: Conjunctivae normal.  Cardiovascular:     Rate and Rhythm: Normal rate and regular rhythm.  Pulmonary:     Effort: Pulmonary effort is normal. No respiratory distress.     Breath sounds: No wheezing, rhonchi or rales.  Abdominal:     General: Abdomen is flat. Bowel sounds are normal. There is no distension.     Palpations: Abdomen is soft.     Tenderness: There is no abdominal tenderness. There is no guarding or rebound.  Neurological:     Mental Status: She is alert.  Psychiatric:        Mood and Affect: Mood normal.        Behavior: Behavior normal.        Thought Content: Thought content normal.      UC Treatments / Results  Labs (all labs ordered are listed, but only abnormal results are displayed) Labs Reviewed  GASTROINTESTINAL PANEL BY PCR, STOOL (REPLACES STOOL CULTURE) - Abnormal; Notable for the following components:      Result Value   Norovirus GI/GII DETECTED (*)    All other components within normal limits  POCT URINE PREGNANCY - Normal  OVA + PARASITE  EXAM    EKG   Radiology No results found.  Procedures Procedures (including critical care time)  Medications Ordered in UC Medications  ondansetron  (ZOFRAN -ODT) disintegrating tablet 4 mg (4 mg Oral Given 09/30/23 1825)    Initial Impression / Assessment and Plan / UC Course  I have reviewed the triage vital signs and the nursing notes.  Pertinent labs & imaging results that were available during my care of the patient were reviewed by me and considered in my medical decision making (see chart for details).    Zofran  administered and prescribed for nausea. Will order stool screening given week long diarrhea. Encouraged increased fluids and follow up with any concerns while awaiting results. Deferred further antibiotic therapy for dental concerns given current diarrhea-- recommend follow up with dentistry.  Final Clinical Impressions(s) / UC Diagnoses   Final diagnoses:  Diarrhea, unspecified type   Discharge Instructions   None    ED Prescriptions     Medication Sig Dispense Auth. Provider   ondansetron  (ZOFRAN -ODT) 4 MG disintegrating tablet Take 1 tablet (4 mg total) by mouth every 8 (eight) hours as needed. 20 tablet Billy Asberry FALCON, PA-C      PDMP not reviewed this encounter.  Billy Asberry FALCON, PA-C 10/05/23 [phone]

## 2023-09-30 NOTE — ED Triage Notes (Signed)
 Dental Pain (upper right side) as well.

## 2023-09-30 NOTE — ED Notes (Signed)
 Pt to collect stool at home and return it to UC asap.

## 2023-09-30 NOTE — ED Triage Notes (Signed)
"  I went to the [facility] (30 th) he gave me a steroid shot in my foot (Left foot) and having loose stools and nausea since. No vomiting". "I called my PCP to speak with the nurse who couldn't get me in today but recommended UC".

## 2023-10-05 ENCOUNTER — Telehealth: Payer: Self-pay

## 2023-10-05 ENCOUNTER — Encounter: Payer: Self-pay | Admitting: Physician Assistant

## 2023-10-05 LAB — GASTROINTESTINAL PANEL BY PCR, STOOL (REPLACES STOOL CULTURE)

## 2023-10-05 NOTE — Telephone Encounter (Signed)
Spoke to pt; results given and verbalized undertanding

## 2023-10-05 NOTE — Telephone Encounter (Signed)
Incoming call by [facility] re: results (to provider), See below:  Gastrointestinal Panel by PCR , Stool Order: [id number]  Status: Final result     Next appt: 03/08/2024 at 03:20 PM in Rheumatology Pollyann Savoy, MD)     Dx: Diarrhea, unspecified type     Test Result Released: Yes (not seen)   Specimen Information: Stool  0 Result Notes    Component Ref Range & Units (hover) 3 d ago  Campylobacter species NOT DETECTED  Plesimonas shigelloides NOT DETECTED  Salmonella species NOT DETECTED  Yersinia enterocolitica NOT DETECTED  Vibrio species NOT DETECTED  Vibrio cholerae NOT DETECTED  Enteroaggregative E coli (EAEC) NOT DETECTED  Enteropathogenic E coli (EPEC) NOT DETECTED  Enterotoxigenic E coli (ETEC) NOT DETECTED  Shiga like toxin producing E coli (STEC) NOT DETECTED  Shigella/Enteroinvasive E coli (EIEC) NOT DETECTED  Cryptosporidium NOT DETECTED  Cyclospora cayetanensis NOT DETECTED  Entamoeba histolytica NOT DETECTED  Giardia lamblia NOT DETECTED  Adenovirus F40/41 NOT DETECTED  Astrovirus NOT DETECTED  Norovirus GI/GII DETECTED Abnormal   Rotavirus A NOT DETECTED  Sapovirus (I, II, IV, and V) NOT DETECTED  Comment: Performed at [facility], [address], [place], [place] [address]  Resulting Agency [facility]        Specimen Collected: 10/02/23 15:00 Last Resulted: 10/03/23 15:21    Message/Results sent to R. Reggie Pile for review.  Orion Crook. CMA

## 2023-10-07 LAB — O&P RESULT

## 2023-10-07 LAB — OVA + PARASITE EXAM

## 2023-10-21 ENCOUNTER — Other Ambulatory Visit: Payer: Self-pay | Admitting: Obstetrics and Gynecology

## 2023-10-21 NOTE — Patient Outreach (Cosign Needed)
  Medicaid Managed Care   Unsuccessful Attempt Note   10/21/2023 Name: Hannah Fleming MRN: [id number] DOB: 1980-12-13  Referred by: [name], MD Reason for referral : High Risk Managed Medicaid (Unsuccessful telephone outreach)  Third unsuccessful telephone outreach was attempted today. The patient was referred to the case management team for assistance with care management and care coordination. The patient's primary care provider has been notified of our unsuccessful attempts to make or maintain contact with the patient. The care management team is pleased to engage with this patient at any time in the future should he/she be interested in assistance from the care management team.    Follow Up Plan: The  Patient has been provided with contact information for the Managed Medicaid care management team and has been advised to call with any health related questions or concerns. and The Managed Medicaid care management team is available to follow up with the patient after provider conversation with the patient regarding recommendation for care management engagement and subsequent re-referral to the care management team.    Kathi Der RN, BSN, CM [facility] RN Care Manager Direct Dial [phone]

## 2023-10-21 NOTE — Patient Instructions (Signed)
 Visit Information  Ms. [name]  - as a part of your Medicaid benefit, you are eligible for care management and care coordination services at no cost or copay. I was unable to reach you by phone today but would be happy to help you with your health related needs. Please feel free to call me at [phone].  Kathi Der RN, BSN, [employer] [facility] RN Care Manager Direct Dial [phone]/GNF [phone] Website: [email or web address]

## 2023-10-24 ENCOUNTER — Encounter (HOSPITAL_BASED_OUTPATIENT_CLINIC_OR_DEPARTMENT_OTHER): Payer: Self-pay | Admitting: Emergency Medicine

## 2023-10-24 ENCOUNTER — Other Ambulatory Visit: Payer: Self-pay

## 2023-10-24 ENCOUNTER — Emergency Department (HOSPITAL_BASED_OUTPATIENT_CLINIC_OR_DEPARTMENT_OTHER)
Admission: EM | Admit: 2023-10-24 | Discharge: 2023-10-24 | Disposition: A | Discharge status: Home / Self Care | Attending: Emergency Medicine | Admitting: Emergency Medicine

## 2023-10-24 ENCOUNTER — Emergency Department (HOSPITAL_BASED_OUTPATIENT_CLINIC_OR_DEPARTMENT_OTHER)

## 2023-10-24 DIAGNOSIS — R630 Anorexia: Secondary | ICD-10-CM | POA: Diagnosis not present

## 2023-10-24 DIAGNOSIS — R110 Nausea: Secondary | ICD-10-CM | POA: Diagnosis not present

## 2023-10-24 DIAGNOSIS — R519 Headache, unspecified: Secondary | ICD-10-CM

## 2023-10-24 DIAGNOSIS — Z91040 Latex allergy status: Secondary | ICD-10-CM | POA: Insufficient documentation

## 2023-10-24 DIAGNOSIS — Z8616 Personal history of COVID-19: Secondary | ICD-10-CM | POA: Insufficient documentation

## 2023-10-24 DIAGNOSIS — J0120 Acute ethmoidal sinusitis, unspecified: Secondary | ICD-10-CM | POA: Insufficient documentation

## 2023-10-24 LAB — CBC
HCT: 37.8 % (ref 36.0–46.0)
Hemoglobin: 12.5 g/dL (ref 12.0–15.0)
MCH: 29.1 pg (ref 26.0–34.0)
MCHC: 33.1 g/dL (ref 30.0–36.0)
MCV: 87.9 fL (ref 80.0–100.0)
Platelets: 276 10*3/uL (ref 150–400)
RBC: 4.3 MIL/uL (ref 3.87–5.11)
RDW: 13.7 % (ref 11.5–15.5)
WBC: 8.9 10*3/uL (ref 4.0–10.5)
nRBC: 0 % (ref 0.0–0.2)

## 2023-10-24 LAB — HCG, SERUM, QUALITATIVE: Preg, Serum: NEGATIVE

## 2023-10-24 LAB — RESP PANEL BY RT-PCR (RSV, FLU A&B, COVID)  RVPGX2
Influenza A by PCR: NEGATIVE
Influenza B by PCR: NEGATIVE
Resp Syncytial Virus by PCR: NEGATIVE
SARS Coronavirus 2 by RT PCR: NEGATIVE

## 2023-10-24 LAB — BASIC METABOLIC PANEL
Anion gap: 7 (ref 5–15)
BUN: 14 mg/dL (ref 6–20)
CO2: 25 mmol/L (ref 22–32)
Calcium: 8.3 mg/dL — ABNORMAL LOW (ref 8.9–10.3)
Chloride: 104 mmol/L (ref 98–111)
Creatinine, Ser: 0.73 mg/dL (ref 0.44–1.00)
GFR, Estimated: >60 mL/min (ref >60)
Glucose, Bld: 109 mg/dL — ABNORMAL HIGH (ref 70–99)
Potassium: 3.7 mmol/L (ref 3.5–5.1)
Sodium: 136 mmol/L (ref 135–145)

## 2023-10-24 MED ORDER — AMOXICILLIN-POT CLAVULANATE 875-125 MG PO TABS: 1.0000 | ORAL_TABLET | Freq: Two times a day (BID) | ORAL | 0 refills | Status: DC

## 2023-10-24 MED ORDER — KETOROLAC TROMETHAMINE 15 MG/ML IJ SOLN
15.0000 mg | Freq: Once | INTRAMUSCULAR | Status: AC
  Administered 2023-10-24: 15 mg via INTRAVENOUS
  Filled 2023-10-24: qty 1

## 2023-10-24 MED ORDER — PROCHLORPERAZINE EDISYLATE 10 MG/2ML IJ SOLN
10.0000 mg | Freq: Once | INTRAMUSCULAR | Status: AC
  Administered 2023-10-24: 10 mg via INTRAVENOUS
  Filled 2023-10-24: qty 2

## 2023-10-24 MED ORDER — DIPHENHYDRAMINE HCL 50 MG/ML IJ SOLN
12.5000 mg | Freq: Once | INTRAMUSCULAR | Status: AC
  Administered 2023-10-24: 12.5 mg via INTRAVENOUS
  Filled 2023-10-24: qty 1

## 2023-10-24 NOTE — ED Notes (Signed)
 Discharge paperwork given and verbally understood.

## 2023-10-24 NOTE — ED Provider Notes (Signed)
 [place] EMERGENCY DEPARTMENT AT [facility] Provider Note   CSN: [id number] Arrival date & time: 10/24/23  1414     History  Chief Complaint  Patient presents with   Headache    Hannah Fleming is a 43 y.o. female without significant past medical history is reporting to the emergency room with complaint of 4-day of headaches.  Patient reports she does not typically get headaches and this is very abnormal for her.  Reports she has had gradual onset headache each day that she is woken up and scans were so today.  It is right-sided.  Associated with sensitivity to light and noise.  Does have some decreased appetite and nausea.  Denies any vomiting.  Has not noticed any dizziness or lightheadedness.  No weakness in upper or lower extremity.  No difficulty with ambulation.  No blurry vision or change in vision. She has noted today that she has been congested on the right side.  She has not tried anything for her symptoms. Denies fever, cough, chest pain, shortness of breath, abdominal pain, VD.    Headache      Home Medications Prior to Admission medications   Medication Sig Start Date End Date Taking? Authorizing Provider  acetaminophen (TYLENOL) 500 MG tablet Take 1,000 mg by mouth every 6 (six) hours as needed for mild pain or moderate pain.    [provider]  amoxicillin (AMOXIL) 500 MG capsule Take by mouth 2 (two) times daily. 05/17/23   [provider]  Brexpiprazole (REXULTI PO) Take by mouth. Patient not taking: Reported on 09/09/2023    [provider]  clindamycin (CLEOCIN) 150 MG capsule Take by mouth 4 (four) times daily. 09/14/23   [provider]  DULoxetine (CYMBALTA) 20 MG capsule TAKE 1 CAPSULE BY MOUTH EVERY DAY Patient taking differently: Take 20 mg by mouth daily. Last dose: 28th. 02/01/23   [name], NP  fluconazole (DIFLUCAN) 150 MG tablet Take by mouth. 06/15/23   [provider]  ibuprofen (ADVIL) 600  MG tablet Take 1 tablet (600 mg total) by mouth every 6 (six) hours as needed. 04/06/23   Peter Garter, PA  methocarbamol (ROBAXIN) 500 MG tablet Take 1 tablet (500 mg total) by mouth every 8 (eight) hours as needed for muscle spasms. 06/07/23   Sabas Sous, MD  metroNIDAZOLE (FLAGYL) 500 MG tablet Take 1 tablet (500 mg total) by mouth 2 (two) times daily. Patient not taking: Reported on 06/03/2023 05/13/23   [name], MD  metroNIDAZOLE (METROGEL) 0.75 % vaginal gel Place 1 Applicatorful vaginally at bedtime. Apply one applicatorful to vagina at bedtime for 5 days Patient not taking: Reported on 06/03/2023 05/17/23   Anyanwu, Jethro Bastos, MD  Multiple Vitamins-Minerals (ZINC PO) Take by mouth.    [provider]  naproxen (NAPROSYN) 500 MG tablet Take 1 tablet (500 mg total) by mouth 2 (two) times daily. 06/07/23   Sabas Sous, MD  ondansetron (ZOFRAN) 4 MG tablet Take 1 tablet (4 mg total) by mouth every 6 (six) hours. 06/14/23   Piontek, Denny Peon, MD  ondansetron (ZOFRAN-ODT) 4 MG disintegrating tablet Take 1 tablet (4 mg total) by mouth every 8 (eight) hours as needed. 09/30/23   Tomi Bamberger, PA-C  Prenatal Vit-Fe Fumarate-FA (PRENATAL PO) Take by mouth.    [provider]  VITAMIN A PO Take by mouth.    [provider]      Allergies    Prilosec otc [omeprazole  magnesium] and Latex    Review of Systems   Review of Systems  Neurological:  Positive for headaches.    Physical Exam Updated Vital Signs BP (!) 146/107   Pulse 77   Temp 98.9 F (37.2 C) (Oral)   Resp 18   Ht 5\' 4"  (1.626 m)   Wt 95 kg   LMP 09/12/2023 (Exact Date)   SpO2 98%   BMI 35.95 kg/m  Physical Exam Vitals and nursing note reviewed.  Constitutional:      General: She is not in acute distress.    Appearance: She is not toxic-appearing.  HENT:     Head: Normocephalic and atraumatic.  Eyes:     General: No visual field deficit or scleral icterus.    Extraocular  Movements: Extraocular movements intact.     Conjunctiva/sclera: Conjunctivae normal.     Pupils: Pupils are equal, round, and reactive to light.  Cardiovascular:     Rate and Rhythm: Normal rate and regular rhythm.     Pulses: Normal pulses.     Heart sounds: Normal heart sounds.  Pulmonary:     Effort: Pulmonary effort is normal. No respiratory distress.     Breath sounds: Normal breath sounds.  Abdominal:     General: Abdomen is flat. Bowel sounds are normal.     Palpations: Abdomen is soft.     Tenderness: There is no abdominal tenderness.  Musculoskeletal:        General: No swelling or tenderness.     Cervical back: No rigidity.  Skin:    General: Skin is warm and dry.     Findings: No lesion.  Neurological:     General: No focal deficit present.     Mental Status: She is alert and oriented to person, place, and time. Mental status is at baseline.     GCS: GCS eye subscore is 4. GCS verbal subscore is 5. GCS motor subscore is 6.     Cranial Nerves: No cranial nerve deficit.     Motor: No weakness.     Coordination: Coordination normal.  Psychiatric:        Mood and Affect: Mood normal.     ED Results / Procedures / Treatments   Labs (all labs ordered are listed, but only abnormal results are displayed) Labs Reviewed  BASIC METABOLIC PANEL - Abnormal; Notable for the following components:      Result Value   Glucose, Bld 109 (*)    Calcium 8.3 (*)    All other components within normal limits  RESP PANEL BY RT-PCR (RSV, FLU A&B, COVID)  RVPGX2  CBC  HCG, SERUM, QUALITATIVE    EKG None  Radiology CT HEAD WO CONTRAST Result Date: 10/24/2023 CLINICAL DATA:  Headache, increasing frequency or severity. New onset fatigue. EXAM: CT HEAD WITHOUT CONTRAST TECHNIQUE: Contiguous axial images were obtained from the base of the skull through the vertex without intravenous contrast. RADIATION DOSE REDUCTION: This exam was performed according to the departmental  dose-optimization program which includes automated exposure control, adjustment of the mA and/or kV according to patient size and/or use of iterative reconstruction technique. COMPARISON:  CT head without contrast 10/05/2021 FINDINGS: Brain: No acute infarct, hemorrhage, or mass lesion is present. No significant white matter lesions are present. The ventricles are of normal size. No significant extraaxial fluid collection is present. The brainstem and cerebellum are within normal limits. A relatively empty sella is stable. Midline structures are otherwise within normal limits. Vascular: No hyperdense vessel  or unexpected calcification. Skull: Calvarium is intact. No focal lytic or blastic lesions are present. No significant extracranial soft tissue lesion is present. Sinuses/Orbits: Scattered mucosal thickening is present throughout the ethmoid air cells. The paranasal sinuses and mastoid air cells are otherwise clear. The globes and orbits are within normal limits. IMPRESSION: 1. No acute intracranial abnormality or significant interval change. 2. Stable relatively empty sella. This is a nonspecific finding but can be seen in the setting of idiopathic intracranial hypertension. 3. Scattered mucosal thickening throughout the ethmoid air cells. Electronically Signed   By: Marin Roberts M.D.   On: 10/24/2023 16:17    Procedures Procedures    Medications Ordered in ED Medications  prochlorperazine (COMPAZINE) injection 10 mg (has no administration in time range)  diphenhydrAMINE (BENADRYL) injection 12.5 mg (has no administration in time range)    ED Course/ Medical Decision Making/ A&P                                 Medical Decision Making Amount and/or Complexity of Data Reviewed Labs: ordered. Radiology: ordered.  Risk Prescription drug management.   [name] 43 y.o. presented today for HA. Working Ddx: tension headache, migraine, intracranial mass, intracranial hemorrhage,  intracranial infection including meningitis vs encephalitis, trigeminal neuralgia, AVM, sinusitis, cerebral aneurysm, muscular headache, cavernous sinus thrombosis, carotid artery dissection.  R/o DDx: intracranial mass, hemorrhage,or infection including meningitis vs encephalitis, trigeminal neuralgia, AVM, cerebral aneurysm, muscular headache, cavernous sinus thrombosis, carotid artery dissection are less likely due to history of present illness, physical exam, labs/imaging findings  PMHX: none    Unique Tests and My Interpretation:  CBC: Leukocytosis and no anemia CMP: No significant electrolyte abnormality Resp  panel: Negative  CT Head w/o Contrast: Relatively empty sella turcica which could be a finding related to idiopathic intracranial hypertension as well as ethmoid sinus inflammation.  Problem List / ED Course / Critical interventions / Medication management  Patient with headache.  She reports this is pretty abnormal for her.  No meningeal signs on exam.  Headache started gradually and has gotten worse.  It is right-sided associated with sensitivity to light or noise.  She is alert oriented answering questions appropriately with no focal neurological deficits on exam.  She is hemodynamically stable. Given no history of headache will obtain CT head, basic labs and resp panel.  Feeling better after migraine cocktail.  Headache has resolved.  I do have low suspicion for idiopathic intracranial hypertension giving symptoms are not chronic in nature.  She does not have any associated vision changes or focal neurofindings.  Will refer for neurology.  Given that her symptoms have improved she been hemodynamically stable throughout stay patient appropriate for discharge.  Did discuss with patient sinus infection is most likely viral in nature however she would like to be treated with antibiotic.  I think this is reasonable given CT findings and duration of symptoms. I ordered medication including  Compazine, Benadryl.  Once normal head CT will consider Toradol as well. Reevaluation of the patient after these medicines showed that the patient improved Patients vitals assessed. Upon arrival patient is hemodynamically stable.  I have reviewed the patients home medicines and have made adjustments as needed   Plan: F/u w/ PCP in 2-3d to ensure resolution of sx.  Patient was given return precautions. Patient stable for discharge at this time.  Patient educated on sx/dx and verbalized understanding of plan. Return  to ED if new or worsening sx.           Final Clinical Impression(s) / ED Diagnoses Final diagnoses:  Bad headache  Acute non-recurrent ethmoidal sinusitis    Rx / DC Orders ED Discharge Orders          Ordered    Ambulatory referral to Neurology       Comments: An appointment is requested in approximately: 2 weeks   10/24/23 1727    amoxicillin-clavulanate (AUGMENTIN) 875-125 MG tablet  Every 12 hours        10/24/23 1731              Johnpaul Gillentine, Horald Chestnut, PA-C 10/24/23 1750    Sloan Leiter, DO 10/24/23 2308

## 2023-10-24 NOTE — Discharge Instructions (Signed)
 In the emergency room today for sinus congestion and headache.  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 Given concern for sinus infection I have sent an antibiotic to your pharmacy please take as prescribed.  Most sinus infections are secondary to virus and resolve on their own.  I would also recommend taking Claritin or Zyrtec for 2 weeks.  You can also take Benadryl in the evenings.  Make sure staying well-hydrated with primarily water and you can alternate Pedialyte and Gatorade.  If you develop fever or chills Tylenol 1000 mg every 6 hours you can alternate with ibuprofen. Return to ER with new or worsening symptoms.

## 2023-10-24 NOTE — ED Triage Notes (Signed)
 C/o headache and congestion since Thursday. Denies SHOB or CP.

## 2023-10-25 ENCOUNTER — Encounter: Payer: Self-pay | Admitting: Neurology

## 2023-11-12 ENCOUNTER — Ambulatory Visit (INDEPENDENT_AMBULATORY_CARE_PROVIDER_SITE_OTHER): Admitting: Family Medicine

## 2023-11-12 ENCOUNTER — Encounter: Payer: Self-pay | Admitting: Family Medicine

## 2023-11-12 VITALS — BP 118/80 | HR 93 | Temp 98.0°F | Resp 16 | Ht 64.0 in | Wt 209.2 lb

## 2023-11-12 DIAGNOSIS — R519 Headache, unspecified: Secondary | ICD-10-CM | POA: Diagnosis not present

## 2023-11-12 DIAGNOSIS — M797 Fibromyalgia: Secondary | ICD-10-CM | POA: Diagnosis not present

## 2023-11-12 DIAGNOSIS — L659 Nonscarring hair loss, unspecified: Secondary | ICD-10-CM

## 2023-11-12 DIAGNOSIS — R112 Nausea with vomiting, unspecified: Secondary | ICD-10-CM

## 2023-11-12 MED ORDER — PROMETHAZINE HCL 25 MG PO TABS: 25.0000 mg | ORAL_TABLET | Freq: Three times a day (TID) | ORAL | 0 refills | Status: DC | PRN

## 2023-11-12 NOTE — Progress Notes (Signed)
 Established Patient Office Visit  Subjective    Patient ID: Hannah Fleming, female    DOB: Nov 10, 1980  Age: 43 y.o. MRN: [id number]  CC:  Chief Complaint  Patient presents with   Follow-up    Headache and vomiting, hair falling out    HPI Hannah Fleming presents for follow up of recent ED visit where patient had a headache. Patient reports that she usually does not have headaches regularly. She does have some nausea with her sx. Patient did have viral sx related.   Outpatient Encounter Medications as of 11/12/2023  Medication Sig   acetaminophen (TYLENOL) 500 MG tablet Take 1,000 mg by mouth every 6 (six) hours as needed for mild pain or moderate pain.   DULoxetine (CYMBALTA) 20 MG capsule TAKE 1 CAPSULE BY MOUTH EVERY DAY (Patient taking differently: Take 20 mg by mouth daily. Last dose: 28th.)   ibuprofen (ADVIL) 600 MG tablet Take 1 tablet (600 mg total) by mouth every 6 (six) hours as needed.   methocarbamol (ROBAXIN) 500 MG tablet Take 1 tablet (500 mg total) by mouth every 8 (eight) hours as needed for muscle spasms.   Multiple Vitamins-Minerals (ZINC PO) Take by mouth.   naproxen (NAPROSYN) 500 MG tablet Take 1 tablet (500 mg total) by mouth 2 (two) times daily.   ondansetron (ZOFRAN-ODT) 4 MG disintegrating tablet Take 1 tablet (4 mg total) by mouth every 8 (eight) hours as needed.   Prenatal Vit-Fe Fumarate-FA (PRENATAL PO) Take by mouth.   promethazine (PHENERGAN) 25 MG tablet Take 1 tablet (25 mg total) by mouth every 8 (eight) hours as needed for nausea or vomiting.   VITAMIN A PO Take by mouth.   amoxicillin (AMOXIL) 500 MG capsule Take by mouth 2 (two) times daily. (Patient not taking: Reported on 11/12/2023)   amoxicillin-clavulanate (AUGMENTIN) 875-125 MG tablet Take 1 tablet by mouth every 12 (twelve) hours. (Patient not taking: Reported on 11/12/2023)   Brexpiprazole (REXULTI PO) Take by mouth. (Patient not taking: Reported on 11/12/2023)   clindamycin (CLEOCIN)  150 MG capsule Take by mouth 4 (four) times daily. (Patient not taking: Reported on 11/12/2023)   fluconazole (DIFLUCAN) 150 MG tablet Take by mouth. (Patient not taking: Reported on 11/12/2023)   metroNIDAZOLE (FLAGYL) 500 MG tablet Take 1 tablet (500 mg total) by mouth 2 (two) times daily. (Patient not taking: Reported on 06/03/2023)   metroNIDAZOLE (METROGEL) 0.75 % vaginal gel Place 1 Applicatorful vaginally at bedtime. Apply one applicatorful to vagina at bedtime for 5 days (Patient not taking: Reported on 06/03/2023)   ondansetron (ZOFRAN) 4 MG tablet Take 1 tablet (4 mg total) by mouth every 6 (six) hours. (Patient not taking: Reported on 11/12/2023)   No facility-administered encounter medications on file as of 11/12/2023.    Past Medical History:  Diagnosis Date   Arthritis    RA   Fibroids    Genital herpes    no outbreak in 4yrs   Trichomonas infection    Urticaria     Past Surgical History:  Procedure Laterality Date   DILATATION & CURETTAGE/HYSTEROSCOPY WITH MYOSURE N/A 10/22/2021   Procedure: DILATATION & CURETTAGE/HYSTEROSCOPY WITH MYOSURE;  Surgeon: [name], MD;  Location: [facility];  Service: Gynecology;  Laterality: N/A;   DILATION AND CURETTAGE OF UTERUS  2020   DILATION AND EVACUATION N/A 07/09/2022   Procedure: DILATATION AND EVACUATION;  Surgeon: Catalina Antigua, MD;  Location: [facility];  Service: Gynecology;  Laterality: N/A;   INDUCED  ABORTION     WISDOM TOOTH EXTRACTION      Family History  Problem Relation Age of Onset   Healthy Mother    Sickle cell trait Mother    Healthy Father    Sickle cell trait Sister    Sickle cell trait Brother    Healthy Daughter    Healthy Son    Healthy Son    Other Neg Hx     Social History   Socioeconomic History   Marital status: Single    Spouse name: Not on file   Number of children: 3   Years of education: Not on file   Highest education level: Not on file  Occupational History   Not  on file  Tobacco Use   Smoking status: Former    Current packs/day: 0.00    Average packs/day: 1 pack/day for 4.0 years (4.0 ttl pk-yrs)    Types: Cigarettes    Start date: 02/07/2005    Quit date: 02/07/2009    Years since quitting: 14.7    Passive exposure: Never   Smokeless tobacco: Never  Vaping Use   Vaping status: Never Used  Substance and Sexual Activity   Alcohol use: Yes    Comment: Occassionally.   Drug use: No   Sexual activity: Yes    Partners: Male    Birth control/protection: None  Other Topics Concern   Not on file  Social History Narrative   Not on file   Social Drivers of Health   Financial Resource Strain: Low Risk  (09/14/2023)   Overall Financial Resource Strain (CARDIA)    Difficulty of Paying Living Expenses: Not very hard  Food Insecurity: No Food Insecurity (06/03/2023)   Hunger Vital Sign    Worried About Running Out of Food in the Last Year: Never true    Ran Out of Food in the Last Year: Never true  Transportation Needs: No Transportation Needs (06/03/2023)   PRAPARE - Administrator, Civil Service (Medical): No    Lack of Transportation (Non-Medical): No  Physical Activity: Sufficiently Active (03/03/2023)   Exercise Vital Sign    Days of Exercise per Week: 7 days    Minutes of Exercise per Session: 30 min  Stress: No Stress Concern Present (02/01/2023)   [employer] of Occupational Health - Occupational Stress Questionnaire    Feeling of Stress : Only a little  Social Connections: Socially Integrated (03/03/2023)   Social Connection and Isolation Panel [NHANES]    Frequency of Communication with Friends and Family: More than three times a week    Frequency of Social Gatherings with Friends and Family: More than three times a week    Attends Religious Services: 1 to 4 times per year    Active Member of [employer] or Organizations: Yes    Attends Banker Meetings: 1 to 4 times per year    Marital Status: Living with  partner  Intimate Partner Violence: Not At Risk (08/11/2023)   Humiliation, Afraid, Rape, and Kick questionnaire    Fear of Current or Ex-Partner: No    Emotionally Abused: No    Physically Abused: No    Sexually Abused: No    Review of Systems  All other systems reviewed and are negative.       Objective    BP 118/80   Pulse 93   Temp 98 F (36.7 C) (Oral)   Resp 16   Ht 5\' 4"  (1.626 m)   Wt 209 lb  3.2 oz (94.9 kg)   SpO2 97%   BMI 35.91 kg/m   Physical Exam Vitals and nursing note reviewed.  Constitutional:      General: She is not in acute distress. HENT:     Head: Normocephalic and atraumatic.  Cardiovascular:     Rate and Rhythm: Normal rate and regular rhythm.  Pulmonary:     Effort: Pulmonary effort is normal.     Breath sounds: Normal breath sounds.  Abdominal:     Palpations: Abdomen is soft.     Tenderness: There is no abdominal tenderness.  Neurological:     General: No focal deficit present.     Mental Status: She is alert and oriented to person, place, and time.         Assessment & Plan:   Nonintractable headache, unspecified chronicity pattern, unspecified headache type  Nausea and vomiting, unspecified vomiting type  Fibromyalgia  Hair loss  Other orders -     Promethazine HCl; Take 1 tablet (25 mg total) by mouth every 8 (eight) hours as needed for nausea or vomiting.  Dispense: 30 tablet; Refill: 0     Return in about 4 weeks (around 12/10/2023) for follow up.   Tommie Raymond, MD

## 2023-11-16 ENCOUNTER — Encounter: Payer: Self-pay | Admitting: Family Medicine

## 2023-12-06 ENCOUNTER — Ambulatory Visit (INDEPENDENT_AMBULATORY_CARE_PROVIDER_SITE_OTHER): Admitting: Podiatry

## 2023-12-06 ENCOUNTER — Encounter: Payer: Self-pay | Admitting: Podiatry

## 2023-12-06 DIAGNOSIS — M25572 Pain in left ankle and joints of left foot: Secondary | ICD-10-CM | POA: Diagnosis not present

## 2023-12-06 DIAGNOSIS — M7742 Metatarsalgia, left foot: Secondary | ICD-10-CM

## 2023-12-06 DIAGNOSIS — M7752 Other enthesopathy of left foot: Secondary | ICD-10-CM

## 2023-12-06 MED ORDER — MELOXICAM 15 MG PO TABS: 15.0000 mg | ORAL_TABLET | Freq: Every day | ORAL | 2 refills | Status: DC

## 2023-12-06 MED ORDER — GABAPENTIN 100 MG PO CAPS
100.0000 mg | ORAL_CAPSULE | Freq: Every day | ORAL | 1 refills | Status: DC
Start: 2023-12-06 — End: 2024-02-01

## 2023-12-06 NOTE — Progress Notes (Unsigned)
 Chief Complaint  Patient presents with   Foot Pain    " I am having more pain in my left foot and the pain has shifted from standing on rock and its constantly hurting, I had an injection at the last visit with dr regal, and I feel like I have a limp"   HPI: 43 y.o. female presents today as a follow-up from left foot/ankle pain.  She saw Dr. Charlsie Merles back in January 2025 and states that she had a cortisone injection into the left sinus tarsi joint.  She notes that she still has pain to the area that was injected, but also notes pain along the outside of the left foot and heading up towards the ankle joint.  She has not been immobilized at this time and is full weightbearing.  She notes that she is not able to sleep at night as the pain continually wakes her up.  States that she cannot get comfortable.  Past Medical History:  Diagnosis Date   Arthritis    RA   Fibroids    Genital herpes    no outbreak in 81yrs   Trichomonas infection    Urticaria     Past Surgical History:  Procedure Laterality Date   DILATATION & CURETTAGE/HYSTEROSCOPY WITH MYOSURE N/A 10/22/2021   Procedure: DILATATION & CURETTAGE/HYSTEROSCOPY WITH MYOSURE;  Surgeon: [name], MD;  Location: [facility];  Service: Gynecology;  Laterality: N/A;   DILATION AND CURETTAGE OF UTERUS  2020   DILATION AND EVACUATION N/A 07/09/2022   Procedure: DILATATION AND EVACUATION;  Surgeon: Catalina Antigua, MD;  Location: [facility] OR;  Service: Gynecology;  Laterality: N/A;   INDUCED ABORTION     WISDOM TOOTH EXTRACTION      Allergies  Allergen Reactions   Prilosec Otc [Omeprazole Magnesium] Hives   Latex Itching and Rash    Physical Exam: Palpable pedal pulses noted.  Pain along the peroneal tendons distal to the lateral malleolus, pain along the fifth metatarsal base on the left foot, and pain on palpation of the anterolateral ankle joint of the left.  No crepitus is noted with attempted range of motion.   There is generalized edema to this area.  No ecchymosis is noted and no calor is palpated.  Epicritic sensation is intact.  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 Capsulitis of left ankle   3. Metatarsalgia, left foot      Meds ordered this encounter  Medications   meloxicam (MOBIC) 15 MG tablet    Sig: Take 1 tablet (15 mg total) by mouth daily. Do not take ibuprofen when taking this medication    Dispense:  30 tablet    Refill:  2   gabapentin (NEURONTIN) 100 MG capsule    Sig: Take 1 capsule (100 mg total) by mouth at bedtime.    Dispense:  30 capsule    Refill:  1   MR FOOT LEFT WO CONTRAST  Discussed clinical findings with patient today.  I informed the patient since she is beginning to develop more pain through several areas of the lateral aspect of the left rear foot, that we need to obtain an MRI to evaluate the soft tissues for pathology.  Her previous x-rays were negative for pathology.  Patient was given 2 prescriptions today, 1 for meloxicam 15 mg 1 tablet p.o. daily and the other for Neurontin 100 mg 1 tablet nightly.  This should help  with pain and inflammation to the area while we are waiting on the MRI results.   Joe Murders, DPM, FACFAS [facility]     [address] [place], [place] [address]                Office [phone]  Fax [phone]

## 2023-12-07 DIAGNOSIS — M7752 Other enthesopathy of left foot: Secondary | ICD-10-CM | POA: Diagnosis not present

## 2023-12-07 DIAGNOSIS — M7742 Metatarsalgia, left foot: Secondary | ICD-10-CM | POA: Diagnosis not present

## 2023-12-07 DIAGNOSIS — G5752 Tarsal tunnel syndrome, left lower limb: Secondary | ICD-10-CM | POA: Diagnosis not present

## 2023-12-13 ENCOUNTER — Ambulatory Visit
Admission: RE | Admit: 2023-12-13 | Discharge: 2023-12-13 | Discharge status: Home / Self Care | Source: Ambulatory Visit | Attending: Podiatry

## 2023-12-13 DIAGNOSIS — G8929 Other chronic pain: Secondary | ICD-10-CM | POA: Diagnosis not present

## 2023-12-13 DIAGNOSIS — M79672 Pain in left foot: Secondary | ICD-10-CM | POA: Diagnosis not present

## 2023-12-13 DIAGNOSIS — M7742 Metatarsalgia, left foot: Secondary | ICD-10-CM

## 2023-12-13 DIAGNOSIS — M25572 Pain in left ankle and joints of left foot: Secondary | ICD-10-CM

## 2023-12-13 DIAGNOSIS — M7752 Other enthesopathy of left foot: Secondary | ICD-10-CM

## 2023-12-15 ENCOUNTER — Ambulatory Visit: Admitting: Family Medicine

## 2023-12-29 ENCOUNTER — Encounter: Payer: Self-pay | Admitting: Podiatry

## 2024-01-18 ENCOUNTER — Other Ambulatory Visit: Payer: Self-pay | Admitting: *Deleted

## 2024-01-18 MED ORDER — METHOCARBAMOL 500 MG PO TABS: 500.0000 mg | ORAL_TABLET | Freq: Every evening | ORAL | 0 refills | Status: DC | PRN

## 2024-01-18 NOTE — Addendum Note (Signed)
 Addended by: Adrianne Horn on: 01/18/2024 04:57 PM   Modules accepted: Orders

## 2024-01-18 NOTE — Telephone Encounter (Signed)
 Please ask the patient why she needs methocarbamol .

## 2024-01-18 NOTE — Telephone Encounter (Signed)
 Patient contacted the office requesting a refill on Methocarbamol . We have not refilled this prescription since January 2024. It appears patient did receive a prescription for another provider in October 2024. Please advise.

## 2024-01-18 NOTE — Telephone Encounter (Signed)
 Okay to send a prescription for methocarbamol 500 mg p.o. nightly as needed total 30 tablets with no refill

## 2024-01-18 NOTE — Telephone Encounter (Signed)
 Patient states she needs a refills on the Methocarbamol  for her Fibromyalgia.
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
## 2024-01-31 ENCOUNTER — Encounter: Payer: Self-pay | Admitting: Neurology

## 2024-01-31 ENCOUNTER — Ambulatory Visit: Admitting: Neurology

## 2024-01-31 VITALS — BP 112/83 | HR 85 | Ht 64.0 in | Wt 202.0 lb

## 2024-01-31 DIAGNOSIS — G43009 Migraine without aura, not intractable, without status migrainosus: Secondary | ICD-10-CM

## 2024-01-31 NOTE — Patient Instructions (Signed)
  Limit use of pain relievers to no more than 9 days out of the month.  These medications include acetaminophen , NSAIDs (ibuprofen /Advil /Motrin , naproxen /Aleve , triptans (Imitrex /sumatriptan ), Excedrin, and narcotics.  This will help reduce risk of rebound headaches. Be aware of common food triggers Routine exercise Stay adequately hydrated (aim for 64 oz water daily) Keep headache diary Maintain proper stress management Maintain proper sleep hygiene Do not skip meals Consider supplements:  MigreLief, coenzyme Q10 300mg  daily

## 2024-02-01 ENCOUNTER — Ambulatory Visit: Admitting: Podiatry

## 2024-02-01 DIAGNOSIS — S86312D Strain of muscle(s) and tendon(s) of peroneal muscle group at lower leg level, left leg, subsequent encounter: Secondary | ICD-10-CM

## 2024-02-01 MED ORDER — GABAPENTIN 100 MG PO CAPS: 100.0000 mg | ORAL_CAPSULE | Freq: Every day | ORAL | 3 refills | Status: AC

## 2024-02-02 NOTE — Progress Notes (Signed)
 Chief Complaint  Patient presents with   Results    Rm19/ review MRI left foot and possible surgery consult/ patient says that she has some improvement 50% and that the meloxicam  and gabapentin  helps with the pain and discomfort   HPI: 43 y.o. female presents today to review MRI of her left foot and discuss surgical repair.  She had an MRI of the left foot on 12/13/2023 and the results were available on 12/29/2023.  She has been taking the meloxicam  and gabapentin  and states that she has had improvement of symptoms but it still present.  She still wears the pneumatic cam walker.  There is continued pain even though she has had minimal activity since last seen.  Past Medical History:  Diagnosis Date   Arthritis    RA   Fibroids    Genital herpes    no outbreak in 80yrs   Trichomonas infection    Urticaria    Past Surgical History:  Procedure Laterality Date   DILATATION & CURETTAGE/HYSTEROSCOPY WITH MYOSURE N/A 10/22/2021   Procedure: DILATATION & CURETTAGE/HYSTEROSCOPY WITH MYOSURE;  Surgeon: Abigail Abler, MD;  Location: [facility];  Service: Gynecology;  Laterality: N/A;   DILATION AND CURETTAGE OF UTERUS  2020   DILATION AND EVACUATION N/A 07/09/2022   Procedure: DILATATION AND EVACUATION;  Surgeon: Verlyn Goad, MD;  Location: [facility];  Service: Gynecology;  Laterality: N/A;   INDUCED ABORTION     WISDOM TOOTH EXTRACTION     Allergies  Allergen Reactions   Prilosec Otc [Omeprazole Magnesium] Hives   Latex Itching and Rash    Physical Exam: There are palpable pedal pulses.  There is localized edema along the lateral aspect of the rear foot and ankle area on the left.  There is pain on palpation along the peroneal tendons most notably from the inferior malleolus to the proximity of the fifth metatarsal base.  There is no pain on palpation of the posterior tibial tendon.  There is pain with resisted eversion of the foot in the same area.  No pain with range of  motion of the ankle or subtalar joint.  No crepitus noted.  Antalgic gait noted.  Manual muscle testing 5/5.  No gaps or nodules noted within the peroneal tendons on palpation on the left.  Epicritic sensation intact  MRI left foot, study date 12/13/2023: IMPRESSION: 1. Partial tearing and tendinopathy of the peroneus brevis below the lateral malleolus and extending to the fifth metatarsal base. 2. Mild distal tibialis posterior tenosynovitis.    Electronically Signed   By: Freida Jes M.D.   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 Tear of peroneal tendon, left, subsequent encounter     Meds ordered this encounter  Medications   gabapentin  (NEURONTIN ) 100 MG capsule    Sig: Take 1 capsule (100 mg total) by mouth at bedtime.    Dispense:  30 capsule    Refill:  3   Inform the patient that there was a flattened and attenuated appearance of the peroneus brevis tendon from just below the lateral malleolus extending to the fifth metatarsal base, which was compatible with partial tearing and tendinopathy.  No abnormality was noted to the peroneus longus tendon.  She has went almost 2 months in a cam walker with  use of meloxicam  and gabapentin  combination to help with pain.  She states that this has helped with overall discomfort and she has finally been able to get some sleep at night without the pain waking her up, but she still is barely 50% better.  She denies any high activity levels since last seen.  Her gabapentin  needs to be renewed today.  This is 100 mg 1 tablet p.o. nightly and 3 refills were sent in.  Continue with meloxicam .  She will need to discontinue the meloxicam  ~7 days prior to surgery as well as any other aspirin/ibuprofen  products.  I  would recommend proceeding with surgical intervention to perform a primary repair of the damaged peroneus brevis tendon on the left.  Would also plan to have on hand, some soft tissue graft/substitute material from [employer] present in case there needs to be reinforcement of the damaged tendon.  Also recommend PRP injection to the area prior to closure of the incision.  This would involve outpatient surgery, under intravenous sedation with local anesthesia to the rear foot area.  She would return to her cam walker for approximately 6 weeks postoperatively.  She will be in a posterior splint for the first 1 to 2 weeks and should use a knee scooter to be nonweightbearing at that time.  Discussed benefits, risk, and possible postoperative complications with this outpatient surgery to be performed at [facility] surgical [facility] center.  Also discussed possible sequela if she does not proceed with any surgical intervention which would include prolonged discomfort and ambulatory dysfunction.  Verbal and written consent were obtained today.  All questions were answered.   Joe Murders, DPM, FACFAS [facility]     [address] [place], [place] [address]                Office [phone]  Fax [phone]

## 2024-02-03 ENCOUNTER — Telehealth: Payer: Self-pay | Admitting: Podiatry

## 2024-02-03 NOTE — Telephone Encounter (Signed)
 RECEIVED SURGICAL CONSENT FORMS   LVM FOR PT TO CALL ME DIRECTLY TO GET THE SURGERY SCHEDULED.

## 2024-02-08 ENCOUNTER — Telehealth: Payer: Self-pay | Admitting: Podiatry

## 2024-02-08 NOTE — Telephone Encounter (Signed)
 Pt left message today at 205pm stating she needed to change the date of her surgery due to work.   I returned call and we moved surgery to 03/07/24.  Surgery center was notified

## 2024-02-28 ENCOUNTER — Telehealth: Payer: Self-pay | Admitting: Podiatry

## 2024-02-28 NOTE — Telephone Encounter (Signed)
 PER [employer] WEBSITE NO AUTH IS NEEDED FOR CPT 28086   DOS 03/07/24  DECISION # [id number]

## 2024-02-28 NOTE — Progress Notes (Deleted)
 Office Visit Note  Patient: Hannah Fleming             Date of Birth: [birth year]           MRN: [id number]             PCP: Tanda Bleacher, MD Referring: Tanda Bleacher, MD Visit Date: 03/08/2024 Occupation: @GUAROCC @  Subjective:  No chief complaint on file.   History of Present Illness: Hannah Fleming is a 43 y.o. female ***     Activities of Daily Living:  Patient reports morning stiffness for *** {minute/hour:19697}.   Patient {ACTIONS;DENIES/REPORTS:21021675::Denies} nocturnal pain.  Difficulty dressing/grooming: {ACTIONS;DENIES/REPORTS:21021675::Denies} Difficulty climbing stairs: {ACTIONS;DENIES/REPORTS:21021675::Denies} Difficulty getting out of chair: {ACTIONS;DENIES/REPORTS:21021675::Denies} Difficulty using hands for taps, buttons, cutlery, and/or writing: {ACTIONS;DENIES/REPORTS:21021675::Denies}  No Rheumatology ROS completed.   PMFS History:  Patient Active Problem List   Diagnosis Date Noted   Primary osteoarthritis of both hands 09/09/2023   Primary osteoarthritis of both knees 09/09/2023   Primary osteoarthritis of both feet 09/09/2023   Arthropathy of lumbar facet joint 09/09/2023   Other idiopathic scoliosis, lumbar region 09/09/2023   Fibromyalgia syndrome 09/09/2023   History of multiple miscarriages 09/09/2023   Irregular uterine bleeding 05/07/2023   Prediabetes 06/21/2019   Ear itch 03/30/2019   Tonsillar calculus 03/30/2019   Menorrhagia with regular cycle 12/28/2013   Anxiety 02/07/2013   Dysmenorrhea 02/07/2013    Past Medical History:  Diagnosis Date   Arthritis    RA   Fibroids    Genital herpes    no outbreak in 89yrs   Trichomonas infection    Urticaria     Family History  Problem Relation Age of Onset   Healthy Mother    Sickle cell trait Mother    Healthy Father    Sickle cell trait Sister    Sickle cell trait Brother    Healthy Daughter    Healthy Son    Healthy Son    Other Neg Hx    Past Surgical  History:  Procedure Laterality Date   DILATATION & CURETTAGE/HYSTEROSCOPY WITH MYOSURE N/A 10/22/2021   Procedure: DILATATION & CURETTAGE/HYSTEROSCOPY WITH MYOSURE;  Surgeon: Zina Jerilynn LABOR, MD;  Location: [facility];  Service: Gynecology;  Laterality: N/A;   DILATION AND CURETTAGE OF UTERUS  2020   DILATION AND EVACUATION N/A 07/09/2022   Procedure: DILATATION AND EVACUATION;  Surgeon: Alger Gong, MD;  Location: [facility];  Service: Gynecology;  Laterality: N/A;   INDUCED ABORTION     WISDOM TOOTH EXTRACTION     Social History   Social History Narrative   Right handed   Immunization History  Administered Date(s) Administered   Rho (D) Immune Globulin  06/03/2012     Objective: Vital Signs: There were no vitals taken for this visit.   Physical Exam   Musculoskeletal Exam: ***  CDAI Exam: CDAI Score: -- Patient Global: --; Provider Global: -- Swollen: --; Tender: -- Joint Exam 03/08/2024   No joint exam has been documented for this visit   There is currently no information documented on the homunculus. Go to the Rheumatology activity and complete the homunculus joint exam.  Investigation: No additional findings.  Imaging: No results found.  Recent Labs: Lab Results  Component Value Date   WBC 8.9 10/24/2023   HGB 12.5 10/24/2023   PLT 276 10/24/2023   NA 136 10/24/2023   K 3.7 10/24/2023   CL 104 10/24/2023   CO2 25 10/24/2023   GLUCOSE 109 (H) 10/24/2023  BUN 14 10/24/2023   CREATININE 0.73 10/24/2023   BILITOT 0.2 09/09/2023   ALKPHOS 64 10/22/2021   AST 12 09/09/2023   ALT 10 09/09/2023   PROT 7.2 09/09/2023   ALBUMIN 4.1 10/22/2021   CALCIUM 8.3 (L) 10/24/2023   GFRAA 128 07/22/2020    Speciality Comments: No specialty comments available.  Procedures:  No procedures performed Allergies: Prilosec otc [omeprazole magnesium] and Latex   Assessment / Plan:     Visit Diagnoses: No diagnosis found.  Orders: No orders of the  defined types were placed in this encounter.  No orders of the defined types were placed in this encounter.   Face-to-face time spent with patient was *** minutes. Greater than 50% of time was spent in counseling and coordination of care.  Follow-Up Instructions: No follow-ups on file.   Maya Nash, MD  Note - This record has been created using Animal nutritionist.  Chart creation errors have been sought, but may not always  have been located. Such creation errors do not reflect on  the standard of medical care.

## 2024-03-06 ENCOUNTER — Other Ambulatory Visit: Payer: Self-pay | Admitting: Podiatry

## 2024-03-06 MED ORDER — AMOXICILLIN 500 MG PO CAPS: 500.0000 mg | ORAL_CAPSULE | Freq: Two times a day (BID) | ORAL | 0 refills | Status: AC

## 2024-03-06 MED ORDER — HYDROCODONE-ACETAMINOPHEN 5-325 MG PO TABS: ORAL_TABLET | ORAL | 0 refills | Status: DC

## 2024-03-06 NOTE — Progress Notes (Signed)
 Post-op Rx's sent to patient's pharmacy for tomorrow's case.

## 2024-03-07 ENCOUNTER — Encounter: Admitting: Podiatry

## 2024-03-07 ENCOUNTER — Other Ambulatory Visit: Payer: Self-pay | Admitting: Podiatry

## 2024-03-07 ENCOUNTER — Ambulatory Visit: Admitting: Neurology

## 2024-03-07 DIAGNOSIS — M66362 Spontaneous rupture of flexor tendons, left lower leg: Secondary | ICD-10-CM | POA: Diagnosis not present

## 2024-03-07 DIAGNOSIS — S86312A Strain of muscle(s) and tendon(s) of peroneal muscle group at lower leg level, left leg, initial encounter: Secondary | ICD-10-CM | POA: Diagnosis not present

## 2024-03-07 HISTORY — PX: OTHER SURGICAL HISTORY: SHX169

## 2024-03-08 ENCOUNTER — Ambulatory Visit: Payer: Medicaid Other | Admitting: Rheumatology

## 2024-03-08 DIAGNOSIS — M797 Fibromyalgia: Secondary | ICD-10-CM

## 2024-03-08 DIAGNOSIS — F419 Anxiety disorder, unspecified: Secondary | ICD-10-CM

## 2024-03-08 DIAGNOSIS — M62838 Other muscle spasm: Secondary | ICD-10-CM

## 2024-03-08 DIAGNOSIS — M170 Bilateral primary osteoarthritis of knee: Secondary | ICD-10-CM

## 2024-03-08 DIAGNOSIS — L659 Nonscarring hair loss, unspecified: Secondary | ICD-10-CM

## 2024-03-08 DIAGNOSIS — M19041 Primary osteoarthritis, right hand: Secondary | ICD-10-CM

## 2024-03-08 DIAGNOSIS — M19071 Primary osteoarthritis, right ankle and foot: Secondary | ICD-10-CM

## 2024-03-08 DIAGNOSIS — G8929 Other chronic pain: Secondary | ICD-10-CM

## 2024-03-08 DIAGNOSIS — R7303 Prediabetes: Secondary | ICD-10-CM

## 2024-03-08 DIAGNOSIS — N96 Recurrent pregnancy loss: Secondary | ICD-10-CM

## 2024-03-08 DIAGNOSIS — N946 Dysmenorrhea, unspecified: Secondary | ICD-10-CM

## 2024-03-08 DIAGNOSIS — R768 Other specified abnormal immunological findings in serum: Secondary | ICD-10-CM

## 2024-03-08 DIAGNOSIS — M7061 Trochanteric bursitis, right hip: Secondary | ICD-10-CM

## 2024-03-08 DIAGNOSIS — M4126 Other idiopathic scoliosis, lumbar region: Secondary | ICD-10-CM

## 2024-03-08 DIAGNOSIS — M47816 Spondylosis without myelopathy or radiculopathy, lumbar region: Secondary | ICD-10-CM

## 2024-03-13 ENCOUNTER — Ambulatory Visit (INDEPENDENT_AMBULATORY_CARE_PROVIDER_SITE_OTHER): Admitting: Podiatry

## 2024-03-13 ENCOUNTER — Encounter: Payer: Self-pay | Admitting: Podiatry

## 2024-03-13 DIAGNOSIS — Z9889 Other specified postprocedural states: Secondary | ICD-10-CM

## 2024-03-13 NOTE — Progress Notes (Signed)
 Subjective:  Patient ID: Hannah Fleming, female    DOB: 1980-11-27,  MRN: [id number]  Hannah Fleming presents to clinic today for:  Chief Complaint  Patient presents with   Post-op Follow-up    Pain has not been too bad. No swelling reported by patient. Patient took a shower this morning, dressing was wet all the way through. Patient is using knee scooter. Did not bring cam boot with her today. Son is with her today.    Patient is 1 week postop after undergoing a primary repair of a longitudinal split tear of the peroneus brevis tendon on the left foot with PRP injection.  Patient denies fever, chills, night sweats, nausea/vomiting.  Patient denies chest pain or shortness of breath.  Patient denies calf pain.  She did get her posterior splint/dressing soaked today when attempting to shower using a shower bag.  She noted she has not been icing.  Past Medical History:  Diagnosis Date   Arthritis    RA   Fibroids    Genital herpes    no outbreak in 18yrs   Trichomonas infection    Urticaria     Past Surgical History:  Procedure Laterality Date   DILATATION & CURETTAGE/HYSTEROSCOPY WITH MYOSURE N/A 10/22/2021   Procedure: DILATATION & CURETTAGE/HYSTEROSCOPY WITH MYOSURE;  Surgeon: Zina Jerilynn LABOR, MD;  Location: [facility];  Service: Gynecology;  Laterality: N/A;   DILATION AND CURETTAGE OF UTERUS  2020   DILATION AND EVACUATION N/A 07/09/2022   Procedure: DILATATION AND EVACUATION;  Surgeon: Alger Gong, MD;  Location: [facility];  Service: Gynecology;  Laterality: N/A;   INDUCED ABORTION     peroneal tendon repair left Left 03/07/2024   repair of split tear of peroneus brevis tendon with PRP injection   WISDOM TOOTH EXTRACTION      Allergies  Allergen Reactions   Prilosec Otc [Omeprazole Magnesium] Hives   Latex Itching and Rash    Objective:  Physical Examination: There are palpable pedal pulses.  The leg and the posterior splint are wet upon  removal of the dressing.  There is localized edema to the surgical area.  The incision is well-approximated and the sutures are holding well.  No active drainage is noted.  No clinical signs of infection are seen.  Assessment/Plan: 1. Post-operative state     Reviewed the surgical findings with the patient today.  Xeroform gauze and a dry sterile dressing followed by an Ace wrap was applied to the left foot and ankle.  She was placed back into the posterior splint because she did not bring her cam walker and was informed a second 1 would not be covered by her insurance.  Once she gets home the posterior splint can be removed but patient was instructed not to remove the underlying dressing.  She was instructed to ice as instructed and can place the ice down the cam walker or with the posterior knee.  She needs to do this 5-6 times a day for 20 minutes at a time.  When she gets home today she can remove the posterior splint and go back into her cam walker.  She will continue with the knee scooter.  She can place the foot down for touchdown but otherwise should stay off the foot is much as possible.  Keep it elevated above her heart.  Follow-up as scheduled for postop #2  Awanda CHARM Imperial, DPM, FACFAS [facility]  [address] [place], [place] [address]                Office [phone]  Fax [phone]

## 2024-03-14 ENCOUNTER — Telehealth: Payer: Self-pay | Admitting: Podiatry

## 2024-03-14 DIAGNOSIS — Z0271 Encounter for disability determination: Secondary | ICD-10-CM

## 2024-03-14 NOTE — Telephone Encounter (Signed)
 pt had lft mess and I lft mess she called bk. Last day at work 03/05/24 DOS 03/07/24. RTW 05/29/24. I adv if she wants to go back sooner I will do revision. faxing to [phone] and emailing her addr on file

## 2024-03-21 ENCOUNTER — Encounter: Payer: Self-pay | Admitting: Podiatry

## 2024-03-21 ENCOUNTER — Encounter: Admitting: Podiatry

## 2024-03-21 ENCOUNTER — Ambulatory Visit (INDEPENDENT_AMBULATORY_CARE_PROVIDER_SITE_OTHER): Admitting: Podiatry

## 2024-03-21 VITALS — BP 133/86 | HR 85

## 2024-03-21 DIAGNOSIS — S86312D Strain of muscle(s) and tendon(s) of peroneal muscle group at lower leg level, left leg, subsequent encounter: Secondary | ICD-10-CM

## 2024-03-21 DIAGNOSIS — Z9889 Other specified postprocedural states: Secondary | ICD-10-CM

## 2024-03-21 NOTE — Progress Notes (Unsigned)
 Subjective:  Patient ID: Hannah Fleming, female    DOB: 02/08/1981,  MRN: [id number]  Hannah Fleming presents to clinic today for:  Chief Complaint  Patient presents with   Routine Post Op    POV # 2 DOS 715/25 LT PERONEAL TENDON REPAIR W/ PRP INJECTION It's sore.  My pain level is a four.  The amoxicillin  makes me feel bad.  I feel blotted.  I don't have an appetite.  I got C Diff from it in the past.   Patient is 2 weeks postop after undergoing surgical repair of a longitudinal split tear in the left peroneus brevis tendon with PRP injection.  Patient denies fever, chills, night sweats, nausea/vomiting.  Patient denies chest pain or shortness of breath.  Patient denies calf pain.  She notes the cam walker has been uncomfortable with the thicker bandage.  She has family members with her today.  She has been using her knee scooter.  She states that she has been able to keep the dressing clean and dry.  States that she has 2 more pills of the antibiotic to take and has not been taking it regularly because it gives her nausea, loss of appetite, and she noted that she developed C. difficile infection from this in the past.  She denies any diarrhea at this time.  Past Medical History:  Diagnosis Date   Arthritis    RA   Fibroids    Genital herpes    no outbreak in 80yrs   Trichomonas infection    Urticaria     Past Surgical History:  Procedure Laterality Date   DILATATION & CURETTAGE/HYSTEROSCOPY WITH MYOSURE N/A 10/22/2021   Procedure: DILATATION & CURETTAGE/HYSTEROSCOPY WITH MYOSURE;  Surgeon: Zina Jerilynn LABOR, MD;  Location: [facility];  Service: Gynecology;  Laterality: N/A;   DILATION AND CURETTAGE OF UTERUS  2020   DILATION AND EVACUATION N/A 07/09/2022   Procedure: DILATATION AND EVACUATION;  Surgeon: Alger Gong, MD;  Location: [facility];  Service: Gynecology;  Laterality: N/A;   INDUCED ABORTION     peroneal tendon repair left Left 03/07/2024    repair of split tear of peroneus brevis tendon with PRP injection   WISDOM TOOTH EXTRACTION      Allergies  Allergen Reactions   Prilosec Otc [Omeprazole Magnesium] Hives   Latex Itching and Rash    Objective:  Vitals:   03/21/24 1140  BP: 133/86  Pulse: 85    Physical Examination: There are palpable pedal pulses.  There is localized edema to the left lateral ankle area.  The incision is well-approximated and the sutures are holding well.  No active drainage is noted.  No clinical signs of infection are seen.  There is tenderness on palpation of the incision area.  No dehiscence is noted.  Assessment/Plan: 1. Post-operative state   2. Tear of peroneal tendon, left, subsequent encounter    After the patient was examined, the medical assistant was instructed to remove the sutures.  The patient tolerated this fairly well.  The area was then redressed with Steri-Strips, antibiotic ointment, and a dry sterile dressing was applied.  An Ace wrap was then applied.  She will continue with the cam walker at all times.  She may put the foot down for balance if she is standing at a counter or at a table.  Other than that, she should keep it elevated and use the knee scooter for actual ambulation.  Continue with  ice and elevation.  She may remove the dressing this weekend to begin showering.  She was given instructions on how to perform this and how to redress the foot.  If she does not feel comfortable, she can leave this dressing on until her next appointment.  She does need to redress the foot and use the Ace wrap for compression following any bathing activities.  She was instructed to avoid baths or any soaks where the foot is submerged as the incision could open back up.  She was instructed to go ahead and discontinue the oral antibiotic.  There were no signs of infection today and her concerns of C. difficile and nausea do not warrant having her continue with this medication.  I did clarify with  the patient that she does not have any diarrhea.  She stated it was mostly nausea and loss of appetite.   Return for post-op recheck (as scheduled for P/O #3).   Awanda CHARM Imperial, DPM, FACFAS [facility]     [address] [place], [place] [address]                Office [phone]  Fax [phone]

## 2024-03-28 ENCOUNTER — Encounter: Admitting: Podiatry

## 2024-03-29 NOTE — Progress Notes (Deleted)
 Office Visit Note  Patient: Hannah Fleming             Date of Birth: 06/24/1981           MRN: [id number]             PCP: Tanda Bleacher, MD Referring: Tanda Bleacher, MD Visit Date: 04/12/2024 Occupation: @GUAROCC @  Subjective:  No chief complaint on file.   History of Present Illness: Hannah Fleming is a 43 y.o. female ***     Activities of Daily Living:  Patient reports morning stiffness for *** {minute/hour:19697}.   Patient {ACTIONS;DENIES/REPORTS:21021675::Denies} nocturnal pain.  Difficulty dressing/grooming: {ACTIONS;DENIES/REPORTS:21021675::Denies} Difficulty climbing stairs: {ACTIONS;DENIES/REPORTS:21021675::Denies} Difficulty getting out of chair: {ACTIONS;DENIES/REPORTS:21021675::Denies} Difficulty using hands for taps, buttons, cutlery, and/or writing: {ACTIONS;DENIES/REPORTS:21021675::Denies}  No Rheumatology ROS completed.   PMFS History:  Patient Active Problem List   Diagnosis Date Noted   Primary osteoarthritis of both hands 09/09/2023   Primary osteoarthritis of both knees 09/09/2023   Primary osteoarthritis of both feet 09/09/2023   Arthropathy of lumbar facet joint 09/09/2023   Other idiopathic scoliosis, lumbar region 09/09/2023   Fibromyalgia syndrome 09/09/2023   History of multiple miscarriages 09/09/2023   Irregular uterine bleeding 05/07/2023   Prediabetes 06/21/2019   Ear itch 03/30/2019   Tonsillar calculus 03/30/2019   Menorrhagia with regular cycle 12/28/2013   Anxiety 02/07/2013   Dysmenorrhea 02/07/2013    Past Medical History:  Diagnosis Date   Arthritis    RA   Fibroids    Genital herpes    no outbreak in 28yrs   Trichomonas infection    Urticaria     Family History  Problem Relation Age of Onset   Healthy Mother    Sickle cell trait Mother    Healthy Father    Sickle cell trait Sister    Sickle cell trait Brother    Healthy Daughter    Healthy Son    Healthy Son    Other Neg Hx    Past Surgical  History:  Procedure Laterality Date   DILATATION & CURETTAGE/HYSTEROSCOPY WITH MYOSURE N/A 10/22/2021   Procedure: DILATATION & CURETTAGE/HYSTEROSCOPY WITH MYOSURE;  Surgeon: Zina Jerilynn LABOR, MD;  Location: [facility];  Service: Gynecology;  Laterality: N/A;   DILATION AND CURETTAGE OF UTERUS  2020   DILATION AND EVACUATION N/A 07/09/2022   Procedure: DILATATION AND EVACUATION;  Surgeon: Alger Gong, MD;  Location: [facility];  Service: Gynecology;  Laterality: N/A;   INDUCED ABORTION     peroneal tendon repair left Left 03/07/2024   repair of split tear of peroneus brevis tendon with PRP injection   WISDOM TOOTH EXTRACTION     Social History   Social History Narrative   Right handed   Immunization History  Administered Date(s) Administered   Rho (D) Immune Globulin  06/03/2012     Objective: Vital Signs: LMP 01/27/2024    Physical Exam   Musculoskeletal Exam: ***  CDAI Exam: CDAI Score: -- Patient Global: --; Provider Global: -- Swollen: --; Tender: -- Joint Exam 04/12/2024   No joint exam has been documented for this visit   There is currently no information documented on the homunculus. Go to the Rheumatology activity and complete the homunculus joint exam.  Investigation: No additional findings.  Imaging: No results found.  Recent Labs: Lab Results  Component Value Date   WBC 8.9 10/24/2023   HGB 12.5 10/24/2023   PLT 276 10/24/2023   NA 136 10/24/2023   K 3.7 10/24/2023  CL 104 10/24/2023   CO2 25 10/24/2023   GLUCOSE 109 (H) 10/24/2023   BUN 14 10/24/2023   CREATININE 0.73 10/24/2023   BILITOT 0.2 09/09/2023   ALKPHOS 64 10/22/2021   AST 12 09/09/2023   ALT 10 09/09/2023   PROT 7.2 09/09/2023   ALBUMIN 4.1 10/22/2021   CALCIUM 8.3 (L) 10/24/2023   GFRAA 128 07/22/2020    Speciality Comments: No specialty comments available.  Procedures:  No procedures performed Allergies: Prilosec otc [omeprazole magnesium] and Latex    Assessment / Plan:     Visit Diagnoses: Positive RNP antibody  Primary osteoarthritis of both hands  Trochanteric bursitis of both hips  Primary osteoarthritis of both knees  Primary osteoarthritis of both feet  Chronic midline low back pain with bilateral sciatica  Arthropathy of lumbar facet joint  Other idiopathic scoliosis, lumbar region  Fibromyalgia syndrome  Trapezius muscle spasm  History of multiple miscarriages  Prediabetes  Anxiety  Orders: No orders of the defined types were placed in this encounter.  No orders of the defined types were placed in this encounter.   Face-to-face time spent with patient was *** minutes. Greater than 50% of time was spent in counseling and coordination of care.  Follow-Up Instructions: No follow-ups on file.   Waddell CHRISTELLA Craze, PA-C  Note - This record has been created using Dragon software.  Chart creation errors have been sought, but may not always  have been located. Such creation errors do not reflect on  the standard of medical care.

## 2024-04-03 ENCOUNTER — Telehealth: Payer: Self-pay | Admitting: Podiatry

## 2024-04-03 DIAGNOSIS — Z0271 Encounter for disability determination: Secondary | ICD-10-CM

## 2024-04-03 NOTE — Telephone Encounter (Signed)
 Recd [employer] forms. Faxed notes/form [phone]. RTW approx 05/29/24

## 2024-04-04 ENCOUNTER — Encounter: Payer: Self-pay | Admitting: Podiatry

## 2024-04-04 ENCOUNTER — Ambulatory Visit (INDEPENDENT_AMBULATORY_CARE_PROVIDER_SITE_OTHER): Admitting: Podiatry

## 2024-04-04 DIAGNOSIS — Z9889 Other specified postprocedural states: Secondary | ICD-10-CM

## 2024-04-04 NOTE — Progress Notes (Signed)
 Subjective:  Patient ID: Hannah Fleming, female    DOB: 04/29/1981,  MRN: [id number]  Hannah Fleming presents to clinic today for:  Chief Complaint  Patient presents with   Routine Post Op    Feeling better.  No drainage.  Some dried blood on dressing. Does not like boot. Having some pain. 3.  Feels tight and some throbbing  Non Diabetic. No anti coag.   Patient is approximately 4 weeks postop after undergoing a primary repair of peroneus brevis tendon partial tear with PRP injection..  Patient denies fever, chills, night sweats, nausea/vomiting.  Patient denies chest pain or shortness of breath.  Patient denies calf pain.  States that the cam walker is uncomfortable because it always feels tight in the surgical area.  States that she has been trying to stay off the foot as much as possible.  Past Medical History:  Diagnosis Date   Arthritis    RA   Fibroids    Genital herpes    no outbreak in 69yrs   Trichomonas infection    Urticaria     Past Surgical History:  Procedure Laterality Date   DILATATION & CURETTAGE/HYSTEROSCOPY WITH MYOSURE N/A 10/22/2021   Procedure: DILATATION & CURETTAGE/HYSTEROSCOPY WITH MYOSURE;  Surgeon: Zina Jerilynn LABOR, MD;  Location: [facility];  Service: Gynecology;  Laterality: N/A;   DILATION AND CURETTAGE OF UTERUS  2020   DILATION AND EVACUATION N/A 07/09/2022   Procedure: DILATATION AND EVACUATION;  Surgeon: Alger Gong, MD;  Location: [facility];  Service: Gynecology;  Laterality: N/A;   INDUCED ABORTION     peroneal tendon repair left Left 03/07/2024   repair of split tear of peroneus brevis tendon with PRP injection   WISDOM TOOTH EXTRACTION      Allergies  Allergen Reactions   Prilosec Otc [Omeprazole Magnesium] Hives   Latex Itching and Rash    Objective:  Physical Examination: There are palpable pedal pulses.  There is localized edema to the surgical area.  The incision is 99% coapted with the exception of 2  small areas along the incision that have some healing that needs completed.  There is some drainage noted on the dressing from those 2 locations.  No clinical signs of infection are seen.  The edema has improved since last visit.  There is some tenderness along the peroneal tendons with light palpation to the area.  No range of motion was attempted as it is too soon from the surgery date.  Epicritic sensation is intact  Assessment/Plan: 1. Post-operative state     Discussed findings with patient today.  The Steri-Strips were removed and replaced in the areas that have not completely coapted.  Betadine  solution was applied to those areas as well to help dry out the incision where it slightly macerated.  She will continue changing this when she showers but do not remove the Steri-Strips.  She still needs to wear the cam walker at all times weightbearing.  She can remove the cam walker for sleeping and maintain a gauze dressing on the area.  As she can continue with the gauze dressing while wearing the cam walker, or an Ace wrap while wearing the cam walker.  There needs to be some sort of compression to the area when weightbearing in the cam walker.  Keep activities to a minimum.  Return in about 2 weeks (around 04/18/2024) for post-op recheck & eval for Phys Therapy.   Auna Mikkelsen D. Grason Brailsford, DPM,  FACFAS [facility]     [address] [place], [place] [address]                Office [phone]  Fax [phone]

## 2024-04-11 ENCOUNTER — Ambulatory Visit: Admitting: Podiatry

## 2024-04-11 VITALS — BP 144/94 | Temp 96.3°F

## 2024-04-11 DIAGNOSIS — L02619 Cutaneous abscess of unspecified foot: Secondary | ICD-10-CM

## 2024-04-11 DIAGNOSIS — L03119 Cellulitis of unspecified part of limb: Secondary | ICD-10-CM

## 2024-04-11 MED ORDER — SULFAMETHOXAZOLE-TRIMETHOPRIM 800-160 MG PO TABS
1.0000 | ORAL_TABLET | Freq: Two times a day (BID) | ORAL | 0 refills | Status: DC
Start: 2024-04-11 — End: 2024-04-18

## 2024-04-11 MED ORDER — FLUCONAZOLE 150 MG PO TABS
150.0000 mg | ORAL_TABLET | Freq: Once | ORAL | 0 refills | Status: AC
Start: 2024-04-11 — End: 2024-04-11

## 2024-04-11 NOTE — Progress Notes (Unsigned)
 Subjective:  Patient ID: Hannah Fleming, female    DOB: 1981-03-07,  MRN: [id number]  Hannah Fleming presents to clinic today for:  Chief Complaint  Patient presents with   Post-op Problem    Patient called the office this morning, requesting appt today, with c/o infection to surgical area.  States she saw a small amount of pus come out of the incision over the weekend and is having increased pain around the surgical area.  Began 4 days ago, but had not called or went to Urgent Care over the weekend.  Denies N/V or fever, but does relate night sweats lately.  Still wearing her camwalker.  Changing dressing at home every other day.     Patient is 5 weeks postop after undergoing a left peroneal tendon repair with PRP injection.  Patient called the office this morning and with concern of infection.  States that she saw small amount of pus coming out of the incision over the weekend (and took a photo of this on her phone and showed me) and states that she has had increased pain to the area.  She notes some night sweats but denies fever.  Past Medical History:  Diagnosis Date   Arthritis    RA   Fibroids    Genital herpes    no outbreak in 69yrs   Trichomonas infection    Urticaria     Past Surgical History:  Procedure Laterality Date   DILATATION & CURETTAGE/HYSTEROSCOPY WITH MYOSURE N/A 10/22/2021   Procedure: DILATATION & CURETTAGE/HYSTEROSCOPY WITH MYOSURE;  Surgeon: Zina Jerilynn LABOR, MD;  Location: [facility];  Service: Gynecology;  Laterality: N/A;   DILATION AND CURETTAGE OF UTERUS  2020   DILATION AND EVACUATION N/A 07/09/2022   Procedure: DILATATION AND EVACUATION;  Surgeon: Alger Gong, MD;  Location: [facility];  Service: Gynecology;  Laterality: N/A;   INDUCED ABORTION     peroneal tendon repair left Left 03/07/2024   repair of split tear of peroneus brevis tendon with PRP injection   WISDOM TOOTH EXTRACTION      Allergies  Allergen Reactions    Prilosec Otc [Omeprazole Magnesium] Hives   Latex Itching and Rash    Objective:  Vitals:   04/11/24 1159  BP: (!) 144/94  Temp: (!) 96.3 F (35.7 C)   Physical Examination: There are palpable pedal pulses.  There is localized edema and increased warmth to the surgical area.  The incision still has 2 small spots of dehiscence.  The proximal/superior portion of the incision has active drainage.  It is serosanguineous today and no purulence was noted, but did review patient's cell phone photo of purulence expressed from the incision area over the weekend.  Assessment/Plan: 1. Cellulitis and abscess of foot, except toes     Meds ordered this encounter  Medications   sulfamethoxazole -trimethoprim  (BACTRIM  DS) 800-160 MG tablet    Sig: Take 1 tablet by mouth 2 (two) times daily for 10 days.    Dispense:  20 tablet    Refill:  0   fluconazole  (DIFLUCAN ) 150 MG tablet    Sig: Take 1 tablet (150 mg total) by mouth once for 1 dose.    Dispense:  1 tablet    Refill:  0   A swab of the proximal incision was obtained today and sent to [facility] labs for wound culture.  In the meantime, we will start the patient on Bactrim  DS 1 tablet p.o. twice daily  x 10 days.  Also sent in a prescription for a Diflucan  tablet for possible yeast infection.  She sometimes will get these when on antibiotics.  Patient instructed to go immediately to the emergency room if she starts developing a fever over 101.5 degrees or has unresolving severe pain to the surgical area.  On today's visit there is no defined abscess to drain.  Will let the oral antibiotics take effect and see if this resolves with the postop infection.  The area was dressed with iodine  solution and a dry sterile gauze dressing was applied.  Patient encouraged to rest and elevate the foot and do not change the dressing for a couple of days.  Follow-up next week   Annarae Macnair DSABRA Imperial, DPM, FACFAS [facility]     [address] [place], [place] [address]                Office [phone]  Fax [phone]

## 2024-04-12 ENCOUNTER — Ambulatory Visit: Admitting: Physician Assistant

## 2024-04-12 DIAGNOSIS — N96 Recurrent pregnancy loss: Secondary | ICD-10-CM

## 2024-04-12 DIAGNOSIS — M19041 Primary osteoarthritis, right hand: Secondary | ICD-10-CM

## 2024-04-12 DIAGNOSIS — M4126 Other idiopathic scoliosis, lumbar region: Secondary | ICD-10-CM

## 2024-04-12 DIAGNOSIS — F419 Anxiety disorder, unspecified: Secondary | ICD-10-CM

## 2024-04-12 DIAGNOSIS — G8929 Other chronic pain: Secondary | ICD-10-CM

## 2024-04-12 DIAGNOSIS — M170 Bilateral primary osteoarthritis of knee: Secondary | ICD-10-CM

## 2024-04-12 DIAGNOSIS — M7061 Trochanteric bursitis, right hip: Secondary | ICD-10-CM

## 2024-04-12 DIAGNOSIS — M47816 Spondylosis without myelopathy or radiculopathy, lumbar region: Secondary | ICD-10-CM

## 2024-04-12 DIAGNOSIS — R7303 Prediabetes: Secondary | ICD-10-CM

## 2024-04-12 DIAGNOSIS — M62838 Other muscle spasm: Secondary | ICD-10-CM

## 2024-04-12 DIAGNOSIS — M797 Fibromyalgia: Secondary | ICD-10-CM

## 2024-04-12 DIAGNOSIS — M19071 Primary osteoarthritis, right ankle and foot: Secondary | ICD-10-CM

## 2024-04-12 DIAGNOSIS — R768 Other specified abnormal immunological findings in serum: Secondary | ICD-10-CM

## 2024-04-18 ENCOUNTER — Ambulatory Visit (INDEPENDENT_AMBULATORY_CARE_PROVIDER_SITE_OTHER)

## 2024-04-18 ENCOUNTER — Encounter: Payer: Self-pay | Admitting: Podiatry

## 2024-04-18 ENCOUNTER — Ambulatory Visit (INDEPENDENT_AMBULATORY_CARE_PROVIDER_SITE_OTHER): Admitting: Podiatry

## 2024-04-18 VITALS — BP 137/82 | HR 77 | Temp 97.7°F | Ht 64.0 in | Wt 198.0 lb

## 2024-04-18 DIAGNOSIS — L02619 Cutaneous abscess of unspecified foot: Secondary | ICD-10-CM | POA: Diagnosis not present

## 2024-04-18 DIAGNOSIS — Z9889 Other specified postprocedural states: Secondary | ICD-10-CM

## 2024-04-18 DIAGNOSIS — L03119 Cellulitis of unspecified part of limb: Secondary | ICD-10-CM

## 2024-04-18 MED ORDER — SULFAMETHOXAZOLE-TRIMETHOPRIM 800-160 MG PO TABS: 1.0000 | ORAL_TABLET | Freq: Two times a day (BID) | ORAL | 0 refills | Status: AC

## 2024-04-18 NOTE — Progress Notes (Unsigned)
 Chief Complaint  Patient presents with   Post-op Problem    Post op  Feeling better Feels sore when boot or something is rubbing Non Diabetic.  No anti coag   HPI: 43 y.o. female presents today for recheck on postop infection.  She underwent a primary repair of a partial tear in the peroneal tendon on 03/07/2024 along with a PRP injection.  She is taking the Bactrim  and is not having any adverse effects.  She does feel like the pain and swelling have gone down some.  She continues with concerned that the cam walker irritates the surgical area.  Our office does not have any other cam walkers than the brand she is currently wearing.  Denies fever, chills, night sweats, nausea/vomiting  Past Medical History:  Diagnosis Date   Arthritis    RA   Fibroids    Genital herpes    no outbreak in 72yrs   Trichomonas infection    Urticaria    Past Surgical History:  Procedure Laterality Date   DILATATION & CURETTAGE/HYSTEROSCOPY WITH MYOSURE N/A 10/22/2021   Procedure: DILATATION & CURETTAGE/HYSTEROSCOPY WITH MYOSURE;  Surgeon: Zina Jerilynn LABOR, MD;  Location: [facility];  Service: Gynecology;  Laterality: N/A;   DILATION AND CURETTAGE OF UTERUS  2020   DILATION AND EVACUATION N/A 07/09/2022   Procedure: DILATATION AND EVACUATION;  Surgeon: Alger Gong, MD;  Location: [facility];  Service: Gynecology;  Laterality: N/A;   INDUCED ABORTION     peroneal tendon repair left Left 03/07/2024   repair of split tear of peroneus brevis tendon with PRP injection   WISDOM TOOTH EXTRACTION     Allergies  Allergen Reactions   Prilosec Otc [Omeprazole Magnesium] Hives   Latex Itching and Rash    Physical Exam: Palpable pedal pulses.  There is at least 50% decreased edema and calor since seen last week and on the oral antibiotic.  The proximal/superior area of the incision is now almost completely closed.  The small spot on the middle one third aspect of the incision is still open.  There is  granular tissue.  No purulence is expressed.  Base is granular.  There is some peeling and dry skin slough present in the area.  Radiographic Exam (left foot, 3 weightbearing views, 04/18/2024):  Normal osseous mineralization. No fractures noted.  No gas within the soft tissues.  No increase in soft tissue density in the surgical area.  Assessment/Plan of Care: 1. Cellulitis and abscess of foot, except toes   2. Post-operative state      Meds ordered this encounter  Medications   sulfamethoxazole -trimethoprim  (BACTRIM  DS) 800-160 MG tablet    Sig: Take 1 tablet by mouth 2 (two) times daily for 7 days.    Dispense:  14 tablet    Refill:  0   Inform the patient that she is healing well.  At this time the exudate swab culture is not back yet.  Will call the patient once we receive those results to see if we need to change the antibiotic.  At this time I will extend her Bactrim  for a few more days.  The area was dressed with iodine  solution, new Steri-Strips across the 2 areas of the incision that have not closed, Xeroform gauze and a dry sterile dressing.  Ace wrap was applied.  Today's dressing also had an ABD pad to see if this decreases pressure on the incision area from the cam walker.  As she can change this after  a few days.  She does need to keep the dressing on even if she is changing it daily.  She can wear the cam walker at weightbearing times but she is okay to remove the cam walker when resting or when sleeping.  Follow-up as scheduled in approximately 2 weeks.  She would like to return to work with use of the knee scooter on 05/15/2024.  A work note was provided today at [employer].  Awanda CHARM Imperial, DPM, FACFAS [facility]     [address] [place], [place] [address]                Office [phone]  Fax [phone]

## 2024-04-19 ENCOUNTER — Ambulatory Visit: Payer: Self-pay | Admitting: Podiatry

## 2024-05-02 ENCOUNTER — Encounter: Payer: Self-pay | Admitting: Podiatry

## 2024-05-02 ENCOUNTER — Ambulatory Visit (INDEPENDENT_AMBULATORY_CARE_PROVIDER_SITE_OTHER): Admitting: Podiatry

## 2024-05-02 VITALS — BP 130/89 | HR 82 | Temp 97.5°F

## 2024-05-02 DIAGNOSIS — S86312D Strain of muscle(s) and tendon(s) of peroneal muscle group at lower leg level, left leg, subsequent encounter: Secondary | ICD-10-CM

## 2024-05-02 DIAGNOSIS — Z9889 Other specified postprocedural states: Secondary | ICD-10-CM

## 2024-05-02 NOTE — Progress Notes (Signed)
 Chief Complaint  Patient presents with   Routine Post Op    L lateral ankle is closed no redness at site some swelling.  Pt  reported having itching all over with antibiotic and stopped after 4 days.     HPI: 43 y.o. female presents today for postop visit to recheck the infection that had developed to the surgical area along the peroneal tendon on the left ankle.  She has minimal discomfort at this time.  Denies any recent drainage.  She did have to stop taking the oral antibiotics because she was developing some itching.  That was during the second round of antibiotics.  She still wearing the cam walker at this time.  She is scheduled to return to work on 05/15/2024  Past Medical History:  Diagnosis Date   Arthritis    RA   Fibroids    Genital herpes    no outbreak in 22yrs   Trichomonas infection    Urticaria    Past Surgical History:  Procedure Laterality Date   DILATATION & CURETTAGE/HYSTEROSCOPY WITH MYOSURE N/A 10/22/2021   Procedure: DILATATION & CURETTAGE/HYSTEROSCOPY WITH MYOSURE;  Surgeon: Zina Jerilynn LABOR, MD;  Location: [facility];  Service: Gynecology;  Laterality: N/A;   DILATION AND CURETTAGE OF UTERUS  2020   DILATION AND EVACUATION N/A 07/09/2022   Procedure: DILATATION AND EVACUATION;  Surgeon: Alger Gong, MD;  Location: [facility];  Service: Gynecology;  Laterality: N/A;   INDUCED ABORTION     peroneal tendon repair left Left 03/07/2024   repair of split tear of peroneus brevis tendon with PRP injection   WISDOM TOOTH EXTRACTION     Allergies  Allergen Reactions   Prilosec Otc [Omeprazole Magnesium] Hives   Latex Itching and Rash      Physical Exam:   On exam, there are palpable pedal pulses on the left foot.  There is significant decrease in edema to the lateral aspect of the left ankle.  Minimal pain on palpation along the incision today.  The incision appears closed.  No drainage is noted.  There is some peeling along the incision area.   Manual muscle testing with resisted eversion is 5/5.  No gaps or nodules are noted within the peroneal tendon.  Gentle ankle and STJ range of motion do not produce pain.  Assessment/Plan of Care: 1. Post-operative state   2. Tear of peroneal tendon, left, subsequent encounter     AMB REFERRAL TO PHYSICAL THERAPY  Discussed findings with the patient today.  Will get her set up for a formal round of physical therapy to start working on strengthening the peroneal tendon and do some aquatic therapy and work on scar tissue.  Will follow-up in approximately 5 weeks.  She was fitted for a speed pro brace to wear at all times weightbearing.  The proof of delivery and insurance waiver was signed via motion MD for the brace.  This will allow her to become more active and to transition out of the pneumatic cam walker that she has been in for several months.  This way she can start getting used to wearing regular shoes again.  Will reassess in 5 weeks.  Will have her go ahead and proceed with returning to work on 05/15/2024, but she will keep her scooter with her because she states that her work requires a lot of walking.  She can use the scooter for any prolonged weightbearing activities.   Awanda CHARM Imperial, DPM, FACFAS [facility]     [address] [place], [place] [address]                Office [phone]  Fax [phone]

## 2024-05-15 ENCOUNTER — Ambulatory Visit

## 2024-05-15 NOTE — Therapy (Deleted)
 OUTPATIENT PHYSICAL THERAPY LOWER EXTREMITY EVALUATION   Patient Name: Hannah Fleming MRN: [id number] DOB:[birth year], 43 y.o., female Today's Date: 05/15/2024  END OF SESSION:   Past Medical History:  Diagnosis Date   Arthritis    RA   Fibroids    Genital herpes    no outbreak in 65yrs   Trichomonas infection    Urticaria    Past Surgical History:  Procedure Laterality Date   DILATATION & CURETTAGE/HYSTEROSCOPY WITH MYOSURE N/A 10/22/2021   Procedure: DILATATION & CURETTAGE/HYSTEROSCOPY WITH MYOSURE;  Surgeon: Zina Jerilynn LABOR, MD;  Location: [facility];  Service: Gynecology;  Laterality: N/A;   DILATION AND CURETTAGE OF UTERUS  2020   DILATION AND EVACUATION N/A 07/09/2022   Procedure: DILATATION AND EVACUATION;  Surgeon: Alger Gong, MD;  Location: [facility];  Service: Gynecology;  Laterality: N/A;   INDUCED ABORTION     peroneal tendon repair left Left 03/07/2024   repair of split tear of peroneus brevis tendon with PRP injection   WISDOM TOOTH EXTRACTION     Patient Active Problem List   Diagnosis Date Noted   Primary osteoarthritis of both hands 09/09/2023   Primary osteoarthritis of both knees 09/09/2023   Primary osteoarthritis of both feet 09/09/2023   Arthropathy of lumbar facet joint 09/09/2023   Other idiopathic scoliosis, lumbar region 09/09/2023   Fibromyalgia syndrome 09/09/2023   History of multiple miscarriages 09/09/2023   Irregular uterine bleeding 05/07/2023   Prediabetes 06/21/2019   Ear itch 03/30/2019   Tonsillar calculus 03/30/2019   Menorrhagia with regular cycle 12/28/2013   Anxiety 02/07/2013   Dysmenorrhea 02/07/2013    PCP: Tanda Bleacher, MD   REFERRING PROVIDER: Loel Awanda BIRCH, DPM   REFERRING DIAG: [phone] (ICD-10-CM) - Post-operative state S86.312D (ICD-10-CM) - Tear of peroneal tendon, left, subsequent encounter  THERAPY DIAG:  No diagnosis found.  Rationale for Evaluation and Treatment:  Rehabilitation  ONSET DATE: 03/07/24 DOS  SUBJECTIVE:   SUBJECTIVE STATEMENT: Discussed findings with the patient today.  Will get her set up for a formal round of physical therapy to start working on strengthening the peroneal tendon and do some aquatic therapy and work on scar tissue.  Will follow-up in approximately 5 weeks.   She was fitted for a speed pro brace to wear at all times weightbearing.  The proof of delivery and insurance waiver was signed via motion MD for the brace.  This will allow her to become more active and to transition out of the pneumatic cam walker that she has been in for several months.  This way she can start getting used to wearing regular shoes again.  Will reassess in 5 weeks.  Will have her go ahead and proceed with returning to work on 05/15/2024, but she will keep her scooter with her because she states that her work requires a lot of walking.  She can use the scooter for any prolonged weightbearing activities.  PERTINENT HISTORY: HPI: 43 y.o. female presents today for postop visit to recheck the infection that had developed to the surgical area along the peroneal tendon on the left ankle.  She has minimal discomfort at this time.  Denies any recent drainage.  She did have to stop taking the oral antibiotics because she was developing some itching.  That was during the second round of antibiotics.  She still wearing the cam walker at this time.  She is scheduled to return to work on 05/15/2024 PAIN:  Are you having pain? {OPRCPAIN:27236}  PRECAUTIONS: {Therapy precautions:24002}  RED FLAGS: {PT Red Flags:29287}   WEIGHT BEARING RESTRICTIONS: {Yes ***/No:24003}  FALLS:  Has patient fallen in last 6 months? {fallsyesno:27318}  LIVING ENVIRONMENT: Lives with: {OPRC lives with:25569::lives with their family} Lives in: {Lives in:25570} Stairs: {opstairs:27293} Has following equipment at home: {Assistive devices:23999}  OCCUPATION: ***  PLOF:  {PLOF:24004}  PATIENT GOALS: ***  NEXT MD VISIT: ***  OBJECTIVE:  Note: Objective measures were completed at Evaluation unless otherwise noted.  DIAGNOSTIC FINDINGS: ***  PATIENT SURVEYS:  {rehab surveys:24030}  COGNITION: Overall cognitive status: {cognition:24006}     SENSATION: {sensation:27233}  EDEMA:  {edema:24020}  MUSCLE LENGTH: Hamstrings: Right *** deg; Left *** deg Debby test: Right *** deg; Left *** deg  POSTURE: {posture:25561}  PALPATION: ***  LOWER EXTREMITY ROM:  {AROM/PROM:27142} ROM Right eval Left eval  Hip flexion    Hip extension    Hip abduction    Hip adduction    Hip internal rotation    Hip external rotation    Knee flexion    Knee extension    Ankle dorsiflexion    Ankle plantarflexion    Ankle inversion    Ankle eversion     (Blank rows = not tested)  LOWER EXTREMITY MMT:  MMT Right eval Left eval  Hip flexion    Hip extension    Hip abduction    Hip adduction    Hip internal rotation    Hip external rotation    Knee flexion    Knee extension    Ankle dorsiflexion    Ankle plantarflexion    Ankle inversion    Ankle eversion     (Blank rows = not tested)  LOWER EXTREMITY SPECIAL TESTS:  {LEspecialtests:26242}  FUNCTIONAL TESTS:  {Functional tests:24029}  GAIT: Distance walked: *** Assistive device utilized: {Assistive devices:23999} Level of assistance: {Levels of assistance:24026} Comments: ***                                                                                                                                TREATMENT DATE: ***    PATIENT EDUCATION:  Education details: *** Person educated: {Person educated:25204} Education method: {Education Method:25205} Education comprehension: {Education Comprehension:25206}  HOME EXERCISE PROGRAM: ***  ASSESSMENT:  CLINICAL IMPRESSION: Patient is a *** y.o. *** who was seen today for physical therapy evaluation and treatment for ***.    OBJECTIVE IMPAIRMENTS: {opptimpairments:25111}.   ACTIVITY LIMITATIONS: {activitylimitations:27494}  PARTICIPATION LIMITATIONS: {participationrestrictions:25113}  PERSONAL FACTORS: {Personal factors:25162} are also affecting patient's functional outcome.   REHAB POTENTIAL: {rehabpotential:25112}  CLINICAL DECISION MAKING: {clinical decision making:25114}  EVALUATION COMPLEXITY: {Evaluation complexity:25115}   GOALS: Goals reviewed with patient? {yes/no:20286}  SHORT TERM GOALS: Target date: *** *** Baseline: Goal status: INITIAL  2.  *** Baseline:  Goal status: INITIAL  3.  *** Baseline:  Goal status: INITIAL  4.  *** Baseline:  Goal status: INITIAL  5.  *** Baseline:  Goal status: INITIAL  6.  ***  Baseline:  Goal status: INITIAL  LONG TERM GOALS: Target date: ***  *** Baseline:  Goal status: INITIAL  2.  *** Baseline:  Goal status: INITIAL  3.  *** Baseline:  Goal status: INITIAL  4.  *** Baseline:  Goal status: INITIAL  5.  *** Baseline:  Goal status: INITIAL  6.  *** Baseline:  Goal status: INITIAL   PLAN:  PT FREQUENCY: {rehab frequency:25116}  PT DURATION: {rehab duration:25117}  PLANNED INTERVENTIONS: {rehab planned interventions:25118::97110-Therapeutic exercises,97530- Therapeutic [phone]- Neuromuscular re-education,97535- Self Rjmz,02859- Manual therapy,Patient/Family education}  PLAN FOR NEXT SESSION: ***   Reyes CHRISTELLA Kohut, PT 05/15/2024, 10:04 AM

## 2024-05-18 ENCOUNTER — Other Ambulatory Visit: Payer: Self-pay

## 2024-05-18 ENCOUNTER — Ambulatory Visit: Attending: Podiatry

## 2024-05-18 DIAGNOSIS — R2689 Other abnormalities of gait and mobility: Secondary | ICD-10-CM | POA: Diagnosis present

## 2024-05-18 DIAGNOSIS — R600 Localized edema: Secondary | ICD-10-CM | POA: Insufficient documentation

## 2024-05-18 DIAGNOSIS — M25572 Pain in left ankle and joints of left foot: Secondary | ICD-10-CM | POA: Insufficient documentation

## 2024-05-18 DIAGNOSIS — M6281 Muscle weakness (generalized): Secondary | ICD-10-CM | POA: Diagnosis present

## 2024-05-18 DIAGNOSIS — R2681 Unsteadiness on feet: Secondary | ICD-10-CM | POA: Diagnosis present

## 2024-05-18 NOTE — Therapy (Signed)
 OUTPATIENT PHYSICAL THERAPY LOWER EXTREMITY EVALUATION   Patient Name: Hannah Fleming MRN: [id number] DOB:22-Apr-1981, 43 y.o., female Today's Date: 05/18/2024  END OF SESSION:  PT End of Session - 05/18/24 1102     Visit Number 1    Number of Visits 20    Date for Recertification  07/27/24    Authorization Type [employer] MCD    PT Start Time 0945   arrived late   PT Stop Time 1025    PT Time Calculation (min) 40 min    Activity Tolerance Patient tolerated treatment well    Behavior During Therapy [facility] for tasks assessed/performed          Past Medical History:  Diagnosis Date   Arthritis    RA   Fibroids    Genital herpes    no outbreak in 66yrs   Trichomonas infection    Urticaria    Past Surgical History:  Procedure Laterality Date   DILATATION & CURETTAGE/HYSTEROSCOPY WITH MYOSURE N/A 10/22/2021   Procedure: DILATATION & CURETTAGE/HYSTEROSCOPY WITH MYOSURE;  Surgeon: Zina Jerilynn LABOR, MD;  Location: [facility];  Service: Gynecology;  Laterality: N/A;   DILATION AND CURETTAGE OF UTERUS  2020   DILATION AND EVACUATION N/A 07/09/2022   Procedure: DILATATION AND EVACUATION;  Surgeon: Alger Gong, MD;  Location: [facility] OR;  Service: Gynecology;  Laterality: N/A;   INDUCED ABORTION     peroneal tendon repair left Left 03/07/2024   repair of split tear of peroneus brevis tendon with PRP injection   WISDOM TOOTH EXTRACTION     Patient Active Problem List   Diagnosis Date Noted   Primary osteoarthritis of both hands 09/09/2023   Primary osteoarthritis of both knees 09/09/2023   Primary osteoarthritis of both feet 09/09/2023   Arthropathy of lumbar facet joint 09/09/2023   Other idiopathic scoliosis, lumbar region 09/09/2023   Fibromyalgia syndrome 09/09/2023   History of multiple miscarriages 09/09/2023   Irregular uterine bleeding 05/07/2023   Prediabetes 06/21/2019   Ear itch 03/30/2019   Tonsillar calculus 03/30/2019   Menorrhagia with regular cycle  12/28/2013   Anxiety 02/07/2013   Dysmenorrhea 02/07/2013    PCP: Tanda Bleacher, MD  REFERRING PROVIDER: Loel Awanda BIRCH, DPM   REFERRING DIAG:  [phone] (ICD-10-CM) - Post-operative state S86.312D (ICD-10-CM) - Tear of peroneal tendon, left, subsequent encounter  THERAPY DIAG:  Pain in left ankle and joints of left foot  Muscle weakness (generalized)  Other abnormalities of gait and mobility  Unsteadiness on feet  Localized edema  Rationale for Evaluation and Treatment: Rehabilitation  ONSET DATE: 03/06/2024  SUBJECTIVE:   SUBJECTIVE STATEMENT: Pt presents to PT s/p L peroneal tendon repair performed on 03/06/2024. Post op course complicated by infection at surgical site. She has remained in CAM boot and states that the lace up ASO brace has been too uncomfortable to progress to due to irritation. She is concerned about continued pain and tightness post operatively, also has throbbing along incision site with prolonged sitting.   PERTINENT HISTORY: See PMH  PAIN:  Are you having pain?  Yes: NPRS scale: 3/10 Worst: 4/10 Pain location: L lateral ankle Pain description: sore Aggravating factors: prolonged standing, sitting in dependent  Relieving factors: elevation, ice/compression  PRECAUTIONS: None  RED FLAGS: None   WEIGHT BEARING RESTRICTIONS: No  FALLS:  Has patient fallen in last 6 months? No  LIVING ENVIRONMENT: Lives with: lives with their family Lives in: House/apartment Has following equipment at home: Crutches and CAM boot, knee  scooter, ASO brace  OCCUPATION: [employer]  PLOF: Independent  PATIENT GOALS: get back to PLOF, decrease pain, improve comfort with walking post surgery  NEXT MD VISIT: 05/29/2024  OBJECTIVE:  Note: Objective measures were completed at Evaluation unless otherwise noted.  DIAGNOSTIC FINDINGS: N/A  PATIENT SURVEYS:  LEFS  Extreme difficulty/unable (0), Quite a bit of difficulty (1), Moderate difficulty  (2), Little difficulty (3), No difficulty (4) Survey date:  05/18/2024  Any of your usual work, housework or school activities 4  2. Usual hobbies, recreational or sporting activities 1  3. Getting into/out of the bath 3  4. Walking between rooms 3  5. Putting on socks/shoes 3  6. Squatting  4  7. Lifting an object, like a bag of groceries from the floor 4  8. Performing light activities around your home 4  9. Performing heavy activities around your home 1  10. Getting into/out of a car 3  11. Walking 2 blocks 0  12. Walking 1 mile 0  13. Going up/down 10 stairs (1 flight) 3  14. Standing for 1 hour 1  15.  sitting for 1 hour 3  16. Running on even ground 0  17. Running on uneven ground 0  18. Making sharp turns while running fast 0  19. Hopping  0  20. Rolling over in bed 4  Score total:  44/80     COGNITION: Overall cognitive status: Within functional limits for tasks assessed     SENSATION: WFL  EDEMA:  Circumferential: R 24.5cm; L 26.5cm  POSTURE: rounded shoulders, forward head, and flexed trunk   PALPATION: TTP to L lateral ankle, sensitivity to L incisional site  LOWER EXTREMITY ROM:  Active ROM Right eval Left eval  Hip flexion    Hip extension    Hip abduction    Hip adduction    Hip internal rotation    Hip external rotation    Knee flexion    Knee extension    Ankle dorsiflexion 11 0  Ankle plantarflexion WNL WNL  Ankle inversion WNL 10  Ankle eversion WNL 3   (Blank rows = not tested)  LOWER EXTREMITY MMT:  MMT Right eval Left eval  Hip flexion    Hip extension    Hip abduction    Hip adduction    Hip internal rotation    Hip external rotation    Knee flexion    Knee extension    Ankle dorsiflexion 5 2+  Ankle plantarflexion 5 2+  Ankle inversion 5 2+  Ankle eversion 5 2+   (Blank rows = not tested)  LOWER EXTREMITY SPECIAL TESTS:  DNT  FUNCTIONAL TESTS:  30 Second Sit to Stand: 3 reps with UE SLS: L - 2  sec  GAIT: Distance walked: 23ft Assistive device utilized: CAM boot Level of assistance: Modified independence Comments: severe antalgic gait L   TREATMENT: [facility] Adult PT Treatment:                                                DATE: 05/18/2024 Therapeutic Exercise: Ankle inv/ev AROM x 10 L Ankle pumps x 20 Long sitting calf stretch with towel x 30 L Seated heel raise x 15   PATIENT EDUCATION:  Education details: eval findings, LEFS, HEP, POC Person educated: Patient Education method: Explanation, Demonstration, and Handouts Education comprehension: verbalized understanding  and returned demonstration  HOME EXERCISE PROGRAM: Access Code: [email or web address] URL: [email or web address] Date: 05/18/2024 Prepared by: Alm Kingdom  Exercises - Supine Ankle Inversion AROM  - 2-3 x daily - 7 x weekly - 3 sets - 10 reps - Supine Ankle Eversion AROM  - 2-3 x daily - 7 x weekly - 3 sets - 10 reps - Supine Ankle Pumps  - 2-3 x daily - 7 x weekly - 2-3 sets - 20 reps - Long Sitting Calf Stretch with Strap  - 2-3 x daily - 7 x weekly - 2-3 reps - 30 sec hold - Seated Heel Raise  - 2-3 x daily - 7 x weekly - 3 sets - 15 reps  ASSESSMENT:  CLINICAL IMPRESSION: Patient is a 43 y.o. F who was seen today for physical therapy evaluation and treatment s/p L peroneal tendon repair performed on 03/06/2024. Physical findings are consistent with surgery and recovery timeline as pt demonstrates decrease in L ankle strength, ROM, and stability. LEFS shows severe disability in performance of home ADLs and high level mobility activities post surgery. Pt would benefit from skilled PT services working on improving strength and stability of L ankle post operatively in order to get back to PLOF.   OBJECTIVE IMPAIRMENTS: Abnormal gait, decreased activity tolerance, decreased balance, decreased endurance, decreased mobility, difficulty walking, decreased ROM, decreased strength, and pain.   ACTIVITY  LIMITATIONS: carrying, lifting, sitting, standing, squatting, stairs, transfers, bed mobility, and locomotion level  PARTICIPATION LIMITATIONS: meal prep, cleaning, driving, shopping, community activity, occupation, and yard work  PERSONAL FACTORS: Time since onset of injury/illness/exacerbation and 1 comorbidity: L peroneal tendon repair are also affecting patient's functional outcome.   REHAB POTENTIAL: Good  CLINICAL DECISION MAKING: Evolving/moderate complexity  EVALUATION COMPLEXITY: Moderate   GOALS: Goals reviewed with patient? No  SHORT TERM GOALS: Target date: 06/08/2024   Pt will be compliant and knowledgeable with initial HEP for improved comfort and carryover Baseline: initial HEP given  Goal status: INITIAL  2.  Pt will self report left ankle pain no greater than 2/10 for improved comfort and functional ability Baseline: 4/10 at worst Goal status: INITIAL   LONG TERM GOALS: Target date: 07/13/2024   Pt will improve LEFS to no less than 55/80 as proxy for functional improvement with home ADLs and higher level community activity Baseline: 44/80 Goal status: INITIAL   2.  Pt will self report left foot/ankle pain no greater than 0-1/10 for improved comfort and functional ability Baseline: 4/10 at worst Goal status: INITIAL   3.  Pt will increase 30 Second Sit to Stand rep count to no less than 10 reps for improved balance, strength, and functional mobility Baseline: 3 reps  Goal status: INITIAL   4.  Pt will improve L SLS to no less than 30 seconds for improved functional stability and decrease pain Baseline: 2 seconds Goal status: INITIAL  5.  Pt will improve L ankle DF AROM to at least 10 degrees for improved gait mechanics and decrease pain Baseline: 0 degrees Goal status: INITIAL   PLAN:  PT FREQUENCY: 2x/week  PT DURATION: 8 weeks  PLANNED INTERVENTIONS: 97164- PT Re-evaluation, 97110-Therapeutic exercises, 97530- Therapeutic activity, 97112-  Neuromuscular re-education, 97535- Self Care, [id number]- Manual therapy, [id number]- Gait training, [phone]- Aquatic Therapy, [phone]- Electrical stimulation (unattended), [phone]- Electrical stimulation (manual), 97016- Vasopneumatic device, Patient/Family education, and Cryotherapy  PLAN FOR NEXT SESSION: assess HEP response,    Alm JAYSON Kingdom, PT 05/18/2024, 11:27 AM

## 2024-05-29 ENCOUNTER — Encounter: Payer: Self-pay | Admitting: Podiatry

## 2024-05-29 ENCOUNTER — Ambulatory Visit (INDEPENDENT_AMBULATORY_CARE_PROVIDER_SITE_OTHER): Admitting: Podiatry

## 2024-05-29 DIAGNOSIS — Z9889 Other specified postprocedural states: Secondary | ICD-10-CM

## 2024-05-29 DIAGNOSIS — S86312D Strain of muscle(s) and tendon(s) of peroneal muscle group at lower leg level, left leg, subsequent encounter: Secondary | ICD-10-CM

## 2024-05-29 NOTE — Progress Notes (Signed)
 Chief Complaint  Patient presents with   Routine Post Op    POV. Tear of peroneal tendon, left. 3 pain sharp shooting pain. Wearing ankle brace and had 1 session with P/T. Non diabetic.       HPI: 43 y.o. female presents today for postop follow-up after repair of the left ankle/foot.  She just started physical therapy in the last week and has an upcoming appointment this week.  She is still planning twice per week next week.  She notes she still gets some burning pain/sensitivity along the scar itself.  She has been using some Mederma cream to the area.  She states that she does have some good days but still has some bad days that she is doing on it a lot.  She still uses the knee scooter at work.  Past Medical History:  Diagnosis Date   Arthritis    RA   Fibroids    Genital herpes    no outbreak in 54yrs   Trichomonas infection    Urticaria    Past Surgical History:  Procedure Laterality Date   DILATATION & CURETTAGE/HYSTEROSCOPY WITH MYOSURE N/A 10/22/2021   Procedure: DILATATION & CURETTAGE/HYSTEROSCOPY WITH MYOSURE;  Surgeon: Zina Jerilynn LABOR, MD;  Location: [facility];  Service: Gynecology;  Laterality: N/A;   DILATION AND CURETTAGE OF UTERUS  2020   DILATION AND EVACUATION N/A 07/09/2022   Procedure: DILATATION AND EVACUATION;  Surgeon: Alger Gong, MD;  Location: [facility] OR;  Service: Gynecology;  Laterality: N/A;   INDUCED ABORTION     peroneal tendon repair left Left 03/07/2024   repair of split tear of peroneus brevis tendon with PRP injection   WISDOM TOOTH EXTRACTION     Allergies  Allergen Reactions   Prilosec Otc [Omeprazole Magnesium] Hives   Latex Itching and Rash    Physical Exam: Palpable pedal pulses left foot.  Significant reduction in edema since last seen.  There is still sensitivity directly along the scar itself.  She is able to actively supinate the foot and just feels pulling in the area but no significant pain.  No gaps or nodules are  palpated within the peroneal tendon today.  No erythema or calor is noted  Assessment/Plan of Care: 1. Tear of peroneal tendon, left, subsequent encounter   2. Post-operative state     Discussed with the patient to try silicone scar sheets nightly to the scar area.  She was instructed how to trim these and placed these on the area to help with decreased sensitivity on the scar itself.  She will continue gabapentin  and as needed at this point.  She can call if she needs any refills.  She has returned to work with the restrictions that she is allowed to use her knee scooter.  She will continue with her brace for now, but she was given elastic ankle sleeve to start working towards progression into.  She will have physical therapy let her know when it is time to transition into the elastic ankle sleeve.  Follow-up 5 weeks   Lometa Riggin DSABRA [place], DPM, FACFAS [facility]     [address], [place] [address]  Office [phone]  Fax [phone]

## 2024-06-01 ENCOUNTER — Encounter: Payer: Self-pay | Admitting: Physical Therapy

## 2024-06-01 ENCOUNTER — Ambulatory Visit: Attending: Podiatry | Admitting: Physical Therapy

## 2024-06-01 DIAGNOSIS — M6281 Muscle weakness (generalized): Secondary | ICD-10-CM | POA: Insufficient documentation

## 2024-06-01 DIAGNOSIS — R2689 Other abnormalities of gait and mobility: Secondary | ICD-10-CM | POA: Insufficient documentation

## 2024-06-01 DIAGNOSIS — M25572 Pain in left ankle and joints of left foot: Secondary | ICD-10-CM | POA: Insufficient documentation

## 2024-06-01 NOTE — Therapy (Signed)
 OUTPATIENT PHYSICAL THERAPY LOWER EXTREMITY TREATMENT   Patient Name: Hannah Fleming MRN: [id number] DOB:[birth year], 43 y.o., female Today's Date: 06/01/2024  END OF SESSION:  PT End of Session - 06/01/24 1320     Visit Number 2    Number of Visits 20    Date for Recertification  07/27/24    Authorization Type [employer] MCD    PT Start Time 1317    PT Stop Time 1400    PT Time Calculation (min) 43 min          Past Medical History:  Diagnosis Date   Arthritis    RA   Fibroids    Genital herpes    no outbreak in 76yrs   Trichomonas infection    Urticaria    Past Surgical History:  Procedure Laterality Date   DILATATION & CURETTAGE/HYSTEROSCOPY WITH MYOSURE N/A 10/22/2021   Procedure: DILATATION & CURETTAGE/HYSTEROSCOPY WITH MYOSURE;  Surgeon: Zina Jerilynn LABOR, MD;  Location: [facility] ;  Service: Gynecology;  Laterality: N/A;   DILATION AND CURETTAGE OF UTERUS  2020   DILATION AND EVACUATION N/A 07/09/2022   Procedure: DILATATION AND EVACUATION;  Surgeon: Alger Gong, MD;  Location: [facility] OR;  Service: Gynecology;  Laterality: N/A;   INDUCED ABORTION     peroneal tendon repair left Left 03/07/2024   repair of split tear of peroneus brevis tendon with PRP injection   WISDOM TOOTH EXTRACTION     Patient Active Problem List   Diagnosis Date Noted   Primary osteoarthritis of both hands 09/09/2023   Primary osteoarthritis of both knees 09/09/2023   Primary osteoarthritis of both feet 09/09/2023   Arthropathy of lumbar facet joint 09/09/2023   Other idiopathic scoliosis, lumbar region 09/09/2023   Fibromyalgia syndrome 09/09/2023   History of multiple miscarriages 09/09/2023   Irregular uterine bleeding 05/07/2023   Prediabetes 06/21/2019   Ear itch 03/30/2019   Tonsillar calculus 03/30/2019   Menorrhagia with regular cycle 12/28/2013   Anxiety 02/07/2013   Dysmenorrhea 02/07/2013    PCP: Tanda Bleacher, MD  REFERRING PROVIDER: Loel Awanda BIRCH,  DPM   REFERRING DIAG:  [phone] (ICD-10-CM) - Post-operative state S86.312D (ICD-10-CM) - Tear of peroneal tendon, left, subsequent encounter  THERAPY DIAG:  Pain in left ankle and joints of left foot  Muscle weakness (generalized)  Rationale for Evaluation and Treatment: Rehabilitation  ONSET DATE: 03/06/2024  SUBJECTIVE:   SUBJECTIVE STATEMENT: Pt presents to PT s/p L peroneal tendon repair performed on 03/06/2024. Post op course complicated by infection at surgical site. She has remained in CAM boot and states that the lace up ASO brace has been too uncomfortable to progress to due to irritation. She is concerned about continued pain and tightness post operatively, also has throbbing along incision site with prolonged sitting.   PERTINENT HISTORY: See PMH  PAIN:  Are you having pain?  Yes: NPRS scale: 3/10 Worst: 4/10 Pain location: L lateral ankle Pain description: sore Aggravating factors: prolonged standing, sitting in dependent  Relieving factors: elevation, ice/compression  PRECAUTIONS: None  RED FLAGS: None   WEIGHT BEARING RESTRICTIONS: No  FALLS:  Has patient fallen in last 6 months? No  LIVING ENVIRONMENT: Lives with: lives with their family Lives in: House/apartment Has following equipment at home: Crutches and CAM boot, knee scooter, ASO brace  OCCUPATION: [employer]  PLOF: Independent  PATIENT GOALS: get back to PLOF, decrease pain, improve comfort with walking post surgery  NEXT MD VISIT: 05/29/2024  OBJECTIVE:  Note: Objective  measures were completed at Evaluation unless otherwise noted.  DIAGNOSTIC FINDINGS: N/A  PATIENT SURVEYS:  LEFS  Extreme difficulty/unable (0), Quite a bit of difficulty (1), Moderate difficulty (2), Little difficulty (3), No difficulty (4) Survey date:  05/18/2024  Any of your usual work, housework or school activities 4  2. Usual hobbies, recreational or sporting activities 1  3. Getting into/out of the  bath 3  4. Walking between rooms 3  5. Putting on socks/shoes 3  6. Squatting  4  7. Lifting an object, like a bag of groceries from the floor 4  8. Performing light activities around your home 4  9. Performing heavy activities around your home 1  10. Getting into/out of a car 3  11. Walking 2 blocks 0  12. Walking 1 mile 0  13. Going up/down 10 stairs (1 flight) 3  14. Standing for 1 hour 1  15.  sitting for 1 hour 3  16. Running on even ground 0  17. Running on uneven ground 0  18. Making sharp turns while running fast 0  19. Hopping  0  20. Rolling over in bed 4  Score total:  44/80     COGNITION: Overall cognitive status: Within functional limits for tasks assessed     SENSATION: WFL  EDEMA:  Circumferential: R 24.5cm; L 26.5cm  POSTURE: rounded shoulders, forward head, and flexed trunk   PALPATION: TTP to L lateral ankle, sensitivity to L incisional site  LOWER EXTREMITY ROM:  Active ROM Right eval Left eval Left 06/01/24  Hip flexion     Hip extension     Hip abduction     Hip adduction     Hip internal rotation     Hip external rotation     Knee flexion     Knee extension     Ankle dorsiflexion 11 0 8  Ankle plantarflexion WNL WNL   Ankle inversion WNL 10   Ankle eversion WNL 3    (Blank rows = not tested)  LOWER EXTREMITY MMT:  MMT Right eval Left eval  Hip flexion    Hip extension    Hip abduction    Hip adduction    Hip internal rotation    Hip external rotation    Knee flexion    Knee extension    Ankle dorsiflexion 5 2+  Ankle plantarflexion 5 2+  Ankle inversion 5 2+  Ankle eversion 5 2+   (Blank rows = not tested)  LOWER EXTREMITY SPECIAL TESTS:  DNT  FUNCTIONAL TESTS:  30 Second Sit to Stand: 3 reps with UE SLS: L - 2 sec  GAIT: Distance walked: 37ft Assistive device utilized: CAM boot Level of assistance: Modified independence Comments: severe antalgic gait L   TREATMENT: [facility] Adult PT Treatment:                                                 DATE: 06/01/24 Therapeutic Exercise: Seated ankle circles CW/CCW Seated ankle pumps  Seated Inversion/ eversion AROM  Seated heel raises, seated tow raises  Seated towel slides inv / ev  Supine SLR x 5 each      [facility] Adult PT Treatment:  DATE: 05/18/2024 Therapeutic Exercise: Ankle inv/ev AROM x 10 L Ankle pumps x 20 Long sitting calf stretch with towel x 30 L Seated heel raise x 15   PATIENT EDUCATION:  Education details: eval findings, LEFS, HEP, POC Person educated: Patient Education method: Explanation, Demonstration, and Handouts Education comprehension: verbalized understanding and returned demonstration  HOME EXERCISE PROGRAM: Access Code: [email or web address] URL: [email or web address] Date: 05/18/2024 Prepared by: Alm Kingdom  Exercises - Supine Ankle Inversion AROM  - 2-3 x daily - 7 x weekly - 3 sets - 10 reps - Supine Ankle Eversion AROM  - 2-3 x daily - 7 x weekly - 3 sets - 10 reps - Supine Ankle Pumps  - 2-3 x daily - 7 x weekly - 2-3 sets - 20 reps - Long Sitting Calf Stretch with Strap  - 2-3 x daily - 7 x weekly - 2-3 reps - 30 sec hold - Seated Heel Raise  - 2-3 x daily - 7 x weekly - 3 sets - 15 reps Added - Ankle Inversion Eversion Towel Slide  - 1 x daily - 7 x weekly - 3 sets - 3 reps  ASSESSMENT:  CLINICAL IMPRESSION: Pt DF AROM has improved. She verbalizes compliance with HEP and demonstrates independence. Began desensitization education for scar area. Updated HEP.    EVAL: Patient is a 43 y.o. F who was seen today for physical therapy evaluation and treatment s/p L peroneal tendon repair performed on 03/06/2024. Physical findings are consistent with surgery and recovery timeline as pt demonstrates decrease in L ankle strength, ROM, and stability. LEFS shows severe disability in performance of home ADLs and high level mobility activities post surgery. Pt would benefit from  skilled PT services working on improving strength and stability of L ankle post operatively in order to get back to PLOF.   OBJECTIVE IMPAIRMENTS: Abnormal gait, decreased activity tolerance, decreased balance, decreased endurance, decreased mobility, difficulty walking, decreased ROM, decreased strength, and pain.   ACTIVITY LIMITATIONS: carrying, lifting, sitting, standing, squatting, stairs, transfers, bed mobility, and locomotion level  PARTICIPATION LIMITATIONS: meal prep, cleaning, driving, shopping, community activity, occupation, and yard work  PERSONAL FACTORS: Time since onset of injury/illness/exacerbation and 1 comorbidity: L peroneal tendon repair are also affecting patient's functional outcome.   REHAB POTENTIAL: Good  CLINICAL DECISION MAKING: Evolving/moderate complexity  EVALUATION COMPLEXITY: Moderate   GOALS: Goals reviewed with patient? No  SHORT TERM GOALS: Target date: 06/08/2024   Pt will be compliant and knowledgeable with initial HEP for improved comfort and carryover Baseline: initial HEP given  Goal status: INITIAL  2.  Pt will self report left ankle pain no greater than 2/10 for improved comfort and functional ability Baseline: 4/10 at worst Goal status: INITIAL   LONG TERM GOALS: Target date: 07/13/2024   Pt will improve LEFS to no less than 55/80 as proxy for functional improvement with home ADLs and higher level community activity Baseline: 44/80 Goal status: INITIAL   2.  Pt will self report left foot/ankle pain no greater than 0-1/10 for improved comfort and functional ability Baseline: 4/10 at worst Goal status: INITIAL   3.  Pt will increase 30 Second Sit to Stand rep count to no less than 10 reps for improved balance, strength, and functional mobility Baseline: 3 reps  Goal status: INITIAL   4.  Pt will improve L SLS to no less than 30 seconds for improved functional stability and decrease pain Baseline: 2 seconds Goal status:  INITIAL  5.  Pt will  improve L ankle DF AROM to at least 10 degrees for improved gait mechanics and decrease pain Baseline: 0 degrees Goal status: INITIAL   PLAN:  PT FREQUENCY: 2x/week  PT DURATION: 8 weeks  PLANNED INTERVENTIONS: 97164- PT Re-evaluation, 97110-Therapeutic exercises, 97530- Therapeutic activity, 97112- Neuromuscular re-education, 97535- Self Care, 02859- Manual therapy, [phone]- Gait training, [phone]- Aquatic Therapy, [phone]- Electrical stimulation (unattended), [phone]- Electrical stimulation (manual), 97016- Vasopneumatic device, Patient/Family education, and Cryotherapy  PLAN FOR NEXT SESSION: assess HEP response   Harlene Persons, PTA 06/01/24 2:36 PM Phone: [phone] Fax: [phone]

## 2024-06-05 ENCOUNTER — Ambulatory Visit

## 2024-06-05 NOTE — Therapy (Incomplete)
 OUTPATIENT PHYSICAL THERAPY LOWER EXTREMITY TREATMENT   Patient Name: Hannah Fleming MRN: [id number] DOB:11/09/1980, 43 y.o., female Today's Date: 06/05/2024  END OF SESSION:    Past Medical History:  Diagnosis Date   Arthritis    RA   Fibroids    Genital herpes    no outbreak in 2yrs   Trichomonas infection    Urticaria    Past Surgical History:  Procedure Laterality Date   DILATATION & CURETTAGE/HYSTEROSCOPY WITH MYOSURE N/A 10/22/2021   Procedure: DILATATION & CURETTAGE/HYSTEROSCOPY WITH MYOSURE;  Surgeon: Zina Jerilynn LABOR, MD;  Location: [facility] [place];  Service: Gynecology;  Laterality: N/A;   DILATION AND CURETTAGE OF UTERUS  2020   DILATION AND EVACUATION N/A 07/09/2022   Procedure: DILATATION AND EVACUATION;  Surgeon: Alger Gong, MD;  Location: [facility];  Service: Gynecology;  Laterality: N/A;   INDUCED ABORTION     peroneal tendon repair left Left 03/07/2024   repair of split tear of peroneus brevis tendon with PRP injection   WISDOM TOOTH EXTRACTION     Patient Active Problem List   Diagnosis Date Noted   Primary osteoarthritis of both hands 09/09/2023   Primary osteoarthritis of both knees 09/09/2023   Primary osteoarthritis of both feet 09/09/2023   Arthropathy of lumbar facet joint 09/09/2023   Other idiopathic scoliosis, lumbar region 09/09/2023   Fibromyalgia syndrome 09/09/2023   History of multiple miscarriages 09/09/2023   Irregular uterine bleeding 05/07/2023   Prediabetes 06/21/2019   Ear itch 03/30/2019   Tonsillar calculus 03/30/2019   Menorrhagia with regular cycle 12/28/2013   Anxiety 02/07/2013   Dysmenorrhea 02/07/2013    PCP: Tanda Bleacher, MD  REFERRING PROVIDER: Loel Awanda BIRCH, DPM   REFERRING DIAG:  [phone] (ICD-10-CM) - Post-operative state S86.312D (ICD-10-CM) - Tear of peroneal tendon, left, subsequent encounter  THERAPY DIAG:  No diagnosis found.  Rationale for Evaluation and Treatment:  Rehabilitation  ONSET DATE: 03/06/2024  SUBJECTIVE:   SUBJECTIVE STATEMENT: ***  PERTINENT HISTORY: See PMH  PAIN:  Are you having pain?  Yes: NPRS scale: 3/10 Worst: 4/10 Pain location: L lateral ankle Pain description: sore Aggravating factors: prolonged standing, sitting in dependent  Relieving factors: elevation, ice/compression  PRECAUTIONS: None  RED FLAGS: None   WEIGHT BEARING RESTRICTIONS: No  FALLS:  Has patient fallen in last 6 months? No  LIVING ENVIRONMENT: Lives with: lives with their family Lives in: House/apartment Has following equipment at home: Crutches and CAM boot, knee scooter, ASO brace  OCCUPATION: [employer] [place]  PLOF: Independent  PATIENT GOALS: get back to PLOF, decrease pain, improve comfort with walking post surgery  NEXT MD VISIT: 05/29/2024  OBJECTIVE:  Note: Objective measures were completed at Evaluation unless otherwise noted.  DIAGNOSTIC FINDINGS: N/A  PATIENT SURVEYS:  LEFS  Extreme difficulty/unable (0), Quite a bit of difficulty (1), Moderate difficulty (2), Little difficulty (3), No difficulty (4) Survey date:  05/18/2024  Any of your usual work, housework or school activities 4  2. Usual hobbies, recreational or sporting activities 1  3. Getting into/out of the bath 3  4. Walking between rooms 3  5. Putting on socks/shoes 3  6. Squatting  4  7. Lifting an object, like a bag of groceries from the floor 4  8. Performing light activities around your home 4  9. Performing heavy activities around your home 1  10. Getting into/out of a car 3  11. Walking 2 blocks 0  12. Walking 1 mile 0  13. Going  up/down 10 stairs (1 flight) 3  14. Standing for 1 hour 1  15.  sitting for 1 hour 3  16. Running on even ground 0  17. Running on uneven ground 0  18. Making sharp turns while running fast 0  19. Hopping  0  20. Rolling over in bed 4  Score total:  44/80     COGNITION: Overall cognitive status: Within functional  limits for tasks assessed     SENSATION: WFL  EDEMA:  Circumferential: R 24.5cm; L 26.5cm  POSTURE: rounded shoulders, forward head, and flexed trunk   PALPATION: TTP to L lateral ankle, sensitivity to L incisional site  LOWER EXTREMITY ROM:  Active ROM Right eval Left eval Left 06/01/24  Hip flexion     Hip extension     Hip abduction     Hip adduction     Hip internal rotation     Hip external rotation     Knee flexion     Knee extension     Ankle dorsiflexion 11 0 8  Ankle plantarflexion WNL WNL   Ankle inversion WNL 10   Ankle eversion WNL 3    (Blank rows = not tested)  LOWER EXTREMITY MMT:  MMT Right eval Left eval  Hip flexion    Hip extension    Hip abduction    Hip adduction    Hip internal rotation    Hip external rotation    Knee flexion    Knee extension    Ankle dorsiflexion 5 2+  Ankle plantarflexion 5 2+  Ankle inversion 5 2+  Ankle eversion 5 2+   (Blank rows = not tested)  LOWER EXTREMITY SPECIAL TESTS:  DNT  FUNCTIONAL TESTS:  30 Second Sit to Stand: 3 reps with UE SLS: L - 2 sec  GAIT: Distance walked: 24ft Assistive device utilized: CAM boot Level of assistance: Modified independence Comments: severe antalgic gait L   TREATMENT: [facility] Adult PT Treatment:                                                DATE: 06/05/24 Therapeutic Exercise: Seated ankle circles CW/CCW Seated ankle pumps  Seated Inversion/ eversion AROM  Seated heel raises, seated tow raises  Seated towel slides inv / ev  Supine SLR x 5 each   [facility] Adult PT Treatment:                                                DATE: 06/01/24 Therapeutic Exercise: Seated ankle circles CW/CCW Seated ankle pumps  Seated Inversion/ eversion AROM  Seated heel raises, seated tow raises  Seated towel slides inv / ev  Supine SLR x 5 each   [facility] Adult PT Treatment:                                                DATE: 05/18/2024 Therapeutic Exercise: Ankle inv/ev AROM x 10  L Ankle pumps x 20 Long sitting calf stretch with towel x 30 L Seated heel raise x 15   PATIENT EDUCATION:  Education details: eval findings,  LEFS, HEP, POC Person educated: Patient Education method: Explanation, Demonstration, and Handouts Education comprehension: verbalized understanding and returned demonstration  HOME EXERCISE PROGRAM: Access Code: [email or web address] URL: [email or web address] Date: 05/18/2024 Prepared by: Alm Kingdom  Exercises - Supine Ankle Inversion AROM  - 2-3 x daily - 7 x weekly - 3 sets - 10 reps - Supine Ankle Eversion AROM  - 2-3 x daily - 7 x weekly - 3 sets - 10 reps - Supine Ankle Pumps  - 2-3 x daily - 7 x weekly - 2-3 sets - 20 reps - Long Sitting Calf Stretch with Strap  - 2-3 x daily - 7 x weekly - 2-3 reps - 30 sec hold - Seated Heel Raise  - 2-3 x daily - 7 x weekly - 3 sets - 15 reps Added - Ankle Inversion Eversion Towel Slide  - 1 x daily - 7 x weekly - 3 sets - 3 reps  ASSESSMENT:  CLINICAL IMPRESSION: ***  EVAL: Patient is a 43 y.o. F who was seen today for physical therapy evaluation and treatment s/p L peroneal tendon repair performed on 03/06/2024. Physical findings are consistent with surgery and recovery timeline as pt demonstrates decrease in L ankle strength, ROM, and stability. LEFS shows severe disability in performance of home ADLs and high level mobility activities post surgery. Pt would benefit from skilled PT services working on improving strength and stability of L ankle post operatively in order to get back to PLOF.   OBJECTIVE IMPAIRMENTS: Abnormal gait, decreased activity tolerance, decreased balance, decreased endurance, decreased mobility, difficulty walking, decreased ROM, decreased strength, and pain.   ACTIVITY LIMITATIONS: carrying, lifting, sitting, standing, squatting, stairs, transfers, bed mobility, and locomotion level  PARTICIPATION LIMITATIONS: meal prep, cleaning, driving, shopping, community  activity, occupation, and yard work  PERSONAL FACTORS: Time since onset of injury/illness/exacerbation and 1 comorbidity: L peroneal tendon repair are also affecting patient's functional outcome.   REHAB POTENTIAL: Good  CLINICAL DECISION MAKING: Evolving/moderate complexity  EVALUATION COMPLEXITY: Moderate   GOALS: Goals reviewed with patient? No  SHORT TERM GOALS: Target date: 06/08/2024   Pt will be compliant and knowledgeable with initial HEP for improved comfort and carryover Baseline: initial HEP given  Goal status: INITIAL  2.  Pt will self report left ankle pain no greater than 2/10 for improved comfort and functional ability Baseline: 4/10 at worst Goal status: INITIAL   LONG TERM GOALS: Target date: 07/13/2024   Pt will improve LEFS to no less than 55/80 as proxy for functional improvement with home ADLs and higher level community activity Baseline: 44/80 Goal status: INITIAL   2.  Pt will self report left foot/ankle pain no greater than 0-1/10 for improved comfort and functional ability Baseline: 4/10 at worst Goal status: INITIAL   3.  Pt will increase 30 Second Sit to Stand rep count to no less than 10 reps for improved balance, strength, and functional mobility Baseline: 3 reps  Goal status: INITIAL   4.  Pt will improve L SLS to no less than 30 seconds for improved functional stability and decrease pain Baseline: 2 seconds Goal status: INITIAL  5.  Pt will improve L ankle DF AROM to at least 10 degrees for improved gait mechanics and decrease pain Baseline: 0 degrees Goal status: INITIAL   PLAN:  PT FREQUENCY: 2x/week  PT DURATION: 8 weeks  PLANNED INTERVENTIONS: 97164- PT Re-evaluation, 97110-Therapeutic exercises, 97530- Therapeutic activity, [id number]- Neuromuscular re-education, 97535- Self Care, [id number]- Manual therapy, [id number]- Gait training, [phone]- Aquatic  Therapy, G0283- Electrical stimulation (unattended), [phone]- Electrical stimulation (manual),  [id number]- Vasopneumatic device, Patient/Family education, and Cryotherapy  PLAN FOR NEXT SESSION: assess HEP response   Alm JAYSON Kingdom PT  06/05/24 8:01 AM

## 2024-06-08 ENCOUNTER — Ambulatory Visit

## 2024-06-08 NOTE — Therapy (Incomplete)
 OUTPATIENT PHYSICAL THERAPY LOWER EXTREMITY TREATMENT   Patient Name: Hannah Fleming MRN: [id number] DOB:28-Dec-1980, 43 y.o., female Today's Date: 06/08/2024  END OF SESSION:    Past Medical History:  Diagnosis Date   Arthritis    RA   Fibroids    Genital herpes    no outbreak in 63yrs   Trichomonas infection    Urticaria    Past Surgical History:  Procedure Laterality Date   DILATATION & CURETTAGE/HYSTEROSCOPY WITH MYOSURE N/A 10/22/2021   Procedure: DILATATION & CURETTAGE/HYSTEROSCOPY WITH MYOSURE;  Surgeon: Zina Jerilynn LABOR, MD;  Location: [facility];  Service: Gynecology;  Laterality: N/A;   DILATION AND CURETTAGE OF UTERUS  2020   DILATION AND EVACUATION N/A 07/09/2022   Procedure: DILATATION AND EVACUATION;  Surgeon: Alger Gong, MD;  Location: [facility];  Service: Gynecology;  Laterality: N/A;   INDUCED ABORTION     peroneal tendon repair left Left 03/07/2024   repair of split tear of peroneus brevis tendon with PRP injection   WISDOM TOOTH EXTRACTION     Patient Active Problem List   Diagnosis Date Noted   Primary osteoarthritis of both hands 09/09/2023   Primary osteoarthritis of both knees 09/09/2023   Primary osteoarthritis of both feet 09/09/2023   Arthropathy of lumbar facet joint 09/09/2023   Other idiopathic scoliosis, lumbar region 09/09/2023   Fibromyalgia syndrome 09/09/2023   History of multiple miscarriages 09/09/2023   Irregular uterine bleeding 05/07/2023   Prediabetes 06/21/2019   Ear itch 03/30/2019   Tonsillar calculus 03/30/2019   Menorrhagia with regular cycle 12/28/2013   Anxiety 02/07/2013   Dysmenorrhea 02/07/2013    PCP: Tanda Bleacher, MD  REFERRING PROVIDER: Loel Awanda BIRCH, DPM   REFERRING DIAG:  [phone] (ICD-10-CM) - Post-operative state S86.312D (ICD-10-CM) - Tear of peroneal tendon, left, subsequent encounter  THERAPY DIAG:  No diagnosis found.  Rationale for Evaluation and Treatment:  Rehabilitation  ONSET DATE: 03/06/2024  SUBJECTIVE:   SUBJECTIVE STATEMENT: ***  PERTINENT HISTORY: See PMH  PAIN:  Are you having pain?  Yes: NPRS scale: 3/10 Worst: 4/10 Pain location: L lateral ankle Pain description: sore Aggravating factors: prolonged standing, sitting in dependent  Relieving factors: elevation, ice/compression  PRECAUTIONS: None  RED FLAGS: None   WEIGHT BEARING RESTRICTIONS: No  FALLS:  Has patient fallen in last 6 months? No  LIVING ENVIRONMENT: Lives with: lives with their family Lives in: House/apartment Has following equipment at home: Crutches and CAM boot, knee scooter, ASO brace  OCCUPATION: [employer] [place]  PLOF: Independent  PATIENT GOALS: get back to PLOF, decrease pain, improve comfort with walking post surgery  NEXT MD VISIT: 05/29/2024  OBJECTIVE:  Note: Objective measures were completed at Evaluation unless otherwise noted.  DIAGNOSTIC FINDINGS: N/A  PATIENT SURVEYS:  LEFS  Extreme difficulty/unable (0), Quite a bit of difficulty (1), Moderate difficulty (2), Little difficulty (3), No difficulty (4) Survey date:  05/18/2024  Any of your usual work, housework or school activities 4  2. Usual hobbies, recreational or sporting activities 1  3. Getting into/out of the bath 3  4. Walking between rooms 3  5. Putting on socks/shoes 3  6. Squatting  4  7. Lifting an object, like a bag of groceries from the floor 4  8. Performing light activities around your home 4  9. Performing heavy activities around your home 1  10. Getting into/out of a car 3  11. Walking 2 blocks 0  12. Walking 1 mile 0  13. Going  up/down 10 stairs (1 flight) 3  14. Standing for 1 hour 1  15.  sitting for 1 hour 3  16. Running on even ground 0  17. Running on uneven ground 0  18. Making sharp turns while running fast 0  19. Hopping  0  20. Rolling over in bed 4  Score total:  44/80     COGNITION: Overall cognitive status: Within functional  limits for tasks assessed     SENSATION: WFL  EDEMA:  Circumferential: R 24.5cm; L 26.5cm  POSTURE: rounded shoulders, forward head, and flexed trunk   PALPATION: TTP to L lateral ankle, sensitivity to L incisional site  LOWER EXTREMITY ROM:  Active ROM Right eval Left eval Left 06/01/24  Hip flexion     Hip extension     Hip abduction     Hip adduction     Hip internal rotation     Hip external rotation     Knee flexion     Knee extension     Ankle dorsiflexion 11 0 8  Ankle plantarflexion WNL WNL   Ankle inversion WNL 10   Ankle eversion WNL 3    (Blank rows = not tested)  LOWER EXTREMITY MMT:  MMT Right eval Left eval  Hip flexion    Hip extension    Hip abduction    Hip adduction    Hip internal rotation    Hip external rotation    Knee flexion    Knee extension    Ankle dorsiflexion 5 2+  Ankle plantarflexion 5 2+  Ankle inversion 5 2+  Ankle eversion 5 2+   (Blank rows = not tested)  LOWER EXTREMITY SPECIAL TESTS:  DNT  FUNCTIONAL TESTS:  30 Second Sit to Stand: 3 reps with UE SLS: L - 2 sec  GAIT: Distance walked: 55ft Assistive device utilized: CAM boot Level of assistance: Modified independence Comments: severe antalgic gait L   TREATMENT: [facility] Adult PT Treatment:                                                DATE: 06/05/24 Therapeutic Exercise: Seated ankle circles CW/CCW Seated ankle pumps  Seated Inversion/ eversion AROM  Seated heel raises, seated tow raises  Seated towel slides inv / ev  Supine SLR x 5 each   [facility] Adult PT Treatment:                                                DATE: 06/01/24 Therapeutic Exercise: Seated ankle circles CW/CCW Seated ankle pumps  Seated Inversion/ eversion AROM  Seated heel raises, seated tow raises  Seated towel slides inv / ev  Supine SLR x 5 each   [facility] Adult PT Treatment:                                                DATE: 05/18/2024 Therapeutic Exercise: Ankle inv/ev AROM x 10  L Ankle pumps x 20 Long sitting calf stretch with towel x 30 L Seated heel raise x 15   PATIENT EDUCATION:  Education details: eval findings,  LEFS, HEP, POC Person educated: Patient Education method: Explanation, Demonstration, and Handouts Education comprehension: verbalized understanding and returned demonstration  HOME EXERCISE PROGRAM: Access Code: [email or web address] URL: [email or web address] Date: 05/18/2024 Prepared by: Alm Kingdom  Exercises - Supine Ankle Inversion AROM  - 2-3 x daily - 7 x weekly - 3 sets - 10 reps - Supine Ankle Eversion AROM  - 2-3 x daily - 7 x weekly - 3 sets - 10 reps - Supine Ankle Pumps  - 2-3 x daily - 7 x weekly - 2-3 sets - 20 reps - Long Sitting Calf Stretch with Strap  - 2-3 x daily - 7 x weekly - 2-3 reps - 30 sec hold - Seated Heel Raise  - 2-3 x daily - 7 x weekly - 3 sets - 15 reps Added - Ankle Inversion Eversion Towel Slide  - 1 x daily - 7 x weekly - 3 sets - 3 reps  ASSESSMENT:  CLINICAL IMPRESSION: ***  EVAL: Patient is a 43 y.o. F who was seen today for physical therapy evaluation and treatment s/p L peroneal tendon repair performed on 03/06/2024. Physical findings are consistent with surgery and recovery timeline as pt demonstrates decrease in L ankle strength, ROM, and stability. LEFS shows severe disability in performance of home ADLs and high level mobility activities post surgery. Pt would benefit from skilled PT services working on improving strength and stability of L ankle post operatively in order to get back to PLOF.   OBJECTIVE IMPAIRMENTS: Abnormal gait, decreased activity tolerance, decreased balance, decreased endurance, decreased mobility, difficulty walking, decreased ROM, decreased strength, and pain.   ACTIVITY LIMITATIONS: carrying, lifting, sitting, standing, squatting, stairs, transfers, bed mobility, and locomotion level  PARTICIPATION LIMITATIONS: meal prep, cleaning, driving, shopping, community  activity, occupation, and yard work  PERSONAL FACTORS: Time since onset of injury/illness/exacerbation and 1 comorbidity: L peroneal tendon repair are also affecting patient's functional outcome.   REHAB POTENTIAL: Good  CLINICAL DECISION MAKING: Evolving/moderate complexity  EVALUATION COMPLEXITY: Moderate   GOALS: Goals reviewed with patient? No  SHORT TERM GOALS: Target date: 06/08/2024   Pt will be compliant and knowledgeable with initial HEP for improved comfort and carryover Baseline: initial HEP given  Goal status: INITIAL  2.  Pt will self report left ankle pain no greater than 2/10 for improved comfort and functional ability Baseline: 4/10 at worst Goal status: INITIAL   LONG TERM GOALS: Target date: 07/13/2024   Pt will improve LEFS to no less than 55/80 as proxy for functional improvement with home ADLs and higher level community activity Baseline: 44/80 Goal status: INITIAL   2.  Pt will self report left foot/ankle pain no greater than 0-1/10 for improved comfort and functional ability Baseline: 4/10 at worst Goal status: INITIAL   3.  Pt will increase 30 Second Sit to Stand rep count to no less than 10 reps for improved balance, strength, and functional mobility Baseline: 3 reps  Goal status: INITIAL   4.  Pt will improve L SLS to no less than 30 seconds for improved functional stability and decrease pain Baseline: 2 seconds Goal status: INITIAL  5.  Pt will improve L ankle DF AROM to at least 10 degrees for improved gait mechanics and decrease pain Baseline: 0 degrees Goal status: INITIAL   PLAN:  PT FREQUENCY: 2x/week  PT DURATION: 8 weeks  PLANNED INTERVENTIONS: 97164- PT Re-evaluation, 97110-Therapeutic exercises, 97530- Therapeutic activity, [id number]- Neuromuscular re-education, 97535- Self Care, 02859- Manual therapy, [id number]- Gait training, [phone]- Aquatic  Therapy, G0283- Electrical stimulation (unattended), [phone]- Electrical stimulation (manual),  [id number]- Vasopneumatic device, Patient/Family education, and Cryotherapy  PLAN FOR NEXT SESSION: assess HEP response   Alm JAYSON Kingdom PT  06/08/24 1:46 PM

## 2024-06-09 ENCOUNTER — Ambulatory Visit

## 2024-06-09 DIAGNOSIS — R2689 Other abnormalities of gait and mobility: Secondary | ICD-10-CM

## 2024-06-09 DIAGNOSIS — M25572 Pain in left ankle and joints of left foot: Secondary | ICD-10-CM | POA: Diagnosis not present

## 2024-06-09 DIAGNOSIS — M6281 Muscle weakness (generalized): Secondary | ICD-10-CM

## 2024-06-09 NOTE — Therapy (Signed)
 OUTPATIENT PHYSICAL THERAPY LOWER EXTREMITY TREATMENT   Patient Name: Hannah Fleming MRN: [id number] DOB:[birth year], 43 y.o., female Today's Date: 06/09/2024  END OF SESSION:  PT End of Session - 06/09/24 1115     Visit Number 3    Number of Visits 20    Date for Recertification  07/27/24    Authorization Type [employer] MCD    PT Start Time 1115   arrivd late          Past Medical History:  Diagnosis Date   Arthritis    RA   Fibroids    Genital herpes    no outbreak in 85yrs   Trichomonas infection    Urticaria    Past Surgical History:  Procedure Laterality Date   DILATATION & CURETTAGE/HYSTEROSCOPY WITH MYOSURE N/A 10/22/2021   Procedure: DILATATION & CURETTAGE/HYSTEROSCOPY WITH MYOSURE;  Surgeon: Zina Jerilynn LABOR, MD;  Location: [facility];  Service: Gynecology;  Laterality: N/A;   DILATION AND CURETTAGE OF UTERUS  2020   DILATION AND EVACUATION N/A 07/09/2022   Procedure: DILATATION AND EVACUATION;  Surgeon: Alger Gong, MD;  Location: [facility];  Service: Gynecology;  Laterality: N/A;   INDUCED ABORTION     peroneal tendon repair left Left 03/07/2024   repair of split tear of peroneus brevis tendon with PRP injection   WISDOM TOOTH EXTRACTION     Patient Active Problem List   Diagnosis Date Noted   Primary osteoarthritis of both hands 09/09/2023   Primary osteoarthritis of both knees 09/09/2023   Primary osteoarthritis of both feet 09/09/2023   Arthropathy of lumbar facet joint 09/09/2023   Other idiopathic scoliosis, lumbar region 09/09/2023   Fibromyalgia syndrome 09/09/2023   History of multiple miscarriages 09/09/2023   Irregular uterine bleeding 05/07/2023   Prediabetes 06/21/2019   Ear itch 03/30/2019   Tonsillar calculus 03/30/2019   Menorrhagia with regular cycle 12/28/2013   Anxiety 02/07/2013   Dysmenorrhea 02/07/2013    PCP: Tanda Bleacher, MD  REFERRING PROVIDER: Loel Awanda BIRCH, DPM   REFERRING DIAG:  [phone]  (ICD-10-CM) - Post-operative state S86.312D (ICD-10-CM) - Tear of peroneal tendon, left, subsequent encounter  THERAPY DIAG:  No diagnosis found.  Rationale for Evaluation and Treatment: Rehabilitation  ONSET DATE: 03/06/2024  SUBJECTIVE:   SUBJECTIVE STATEMENT: Pt presents to PT with 2/10 pain in L ankle. Has been compliant with HEP. She has been with her son who is in the hospital right now.   PERTINENT HISTORY: See PMH  PAIN:  Are you having pain?  Yes: NPRS scale: 3/10 Worst: 4/10 Pain location: L lateral ankle Pain description: sore Aggravating factors: prolonged standing, sitting in dependent  Relieving factors: elevation, ice/compression  PRECAUTIONS: None  RED FLAGS: None   WEIGHT BEARING RESTRICTIONS: No  FALLS:  Has patient fallen in last 6 months? No  LIVING ENVIRONMENT: Lives with: lives with their family Lives in: House/apartment Has following equipment at home: Crutches and CAM boot, knee scooter, ASO brace  OCCUPATION: [employer]  PLOF: Independent  PATIENT GOALS: get back to PLOF, decrease pain, improve comfort with walking post surgery  NEXT MD VISIT: 05/29/2024  OBJECTIVE:  Note: Objective measures were completed at Evaluation unless otherwise noted.  DIAGNOSTIC FINDINGS: N/A  PATIENT SURVEYS:  LEFS  Extreme difficulty/unable (0), Quite a bit of difficulty (1), Moderate difficulty (2), Little difficulty (3), No difficulty (4) Survey date:  05/18/2024  Any of your usual work, housework or school activities 4  2. Usual hobbies, recreational or sporting  activities 1  3. Getting into/out of the bath 3  4. Walking between rooms 3  5. Putting on socks/shoes 3  6. Squatting  4  7. Lifting an object, like a bag of groceries from the floor 4  8. Performing light activities around your home 4  9. Performing heavy activities around your home 1  10. Getting into/out of a car 3  11. Walking 2 blocks 0  12. Walking 1 mile 0  13. Going  up/down 10 stairs (1 flight) 3  14. Standing for 1 hour 1  15.  sitting for 1 hour 3  16. Running on even ground 0  17. Running on uneven ground 0  18. Making sharp turns while running fast 0  19. Hopping  0  20. Rolling over in bed 4  Score total:  44/80     COGNITION: Overall cognitive status: Within functional limits for tasks assessed     SENSATION: WFL  EDEMA:  Circumferential: R 24.5cm; L 26.5cm  POSTURE: rounded shoulders, forward head, and flexed trunk   PALPATION: TTP to L lateral ankle, sensitivity to L incisional site  LOWER EXTREMITY ROM:  Active ROM Right eval Left eval Left 06/01/24  Hip flexion     Hip extension     Hip abduction     Hip adduction     Hip internal rotation     Hip external rotation     Knee flexion     Knee extension     Ankle dorsiflexion 11 0 8  Ankle plantarflexion WNL WNL   Ankle inversion WNL 10   Ankle eversion WNL 3    (Blank rows = not tested)  LOWER EXTREMITY MMT:  MMT Right eval Left eval  Hip flexion    Hip extension    Hip abduction    Hip adduction    Hip internal rotation    Hip external rotation    Knee flexion    Knee extension    Ankle dorsiflexion 5 2+  Ankle plantarflexion 5 2+  Ankle inversion 5 2+  Ankle eversion 5 2+   (Blank rows = not tested)  LOWER EXTREMITY SPECIAL TESTS:  DNT  FUNCTIONAL TESTS:  30 Second Sit to Stand: 3 reps with UE SLS: L - 2 sec  GAIT: Distance walked: 62ft Assistive device utilized: CAM boot Level of assistance: Modified independence Comments: severe antalgic gait L   TREATMENT: [facility] Adult PT Treatment:                                                DATE: 06/09/24 Therapeutic Exercise: Long sitting ankle DF/inv/ev 2x10 YTB Seated heel raise with 10# KB 2x10 L STS 2x10 - high table no UE  Seated ankle pumps  Seated Inversion/ eversion AROM  Seated heel raises, seated tow raises  Seated towel slides inv / ev  Supine SLR x 5 each   [facility] Adult PT  Treatment:                                                DATE: 06/01/24 Therapeutic Exercise: Seated ankle circles CW/CCW Seated ankle pumps  Seated Inversion/ eversion AROM  Seated heel raises, seated tow raises  Seated towel  slides inv / ev  Supine SLR x 5 each   [facility] Adult PT Treatment:                                                DATE: 05/18/2024 Therapeutic Exercise: Ankle inv/ev AROM x 10 L Ankle pumps x 20 Long sitting calf stretch with towel x 30 L Seated heel raise x 15   PATIENT EDUCATION:  Education details: eval findings, LEFS, HEP, POC Person educated: Patient Education method: Explanation, Demonstration, and Handouts Education comprehension: verbalized understanding and returned demonstration  HOME EXERCISE PROGRAM: Access Code: [email or web address] URL: [email or web address] Date: 06/09/2024 Prepared by: Alm Kingdom  Exercises - Supine Ankle Inversion AROM  - 2-3 x daily - 7 x weekly - 3 sets - 10 reps - Supine Ankle Eversion AROM  - 2-3 x daily - 7 x weekly - 3 sets - 10 reps - Supine Ankle Pumps  - 2-3 x daily - 7 x weekly - 2-3 sets - 20 reps - Long Sitting Calf Stretch with Strap  - 2-3 x daily - 7 x weekly - 2-3 reps - 30 sec hold - Seated Heel Raise  - 2-3 x daily - 7 x weekly - 3 sets - 15 reps - Ankle Inversion Eversion Towel Slide  - 1 x daily - 7 x weekly - 3 sets - 3 reps - Ankle Dorsiflexion with Resistance  - 1 x daily - 7 x weekly - 2 sets - 10 reps - yellow hold - Ankle Inversion with Resistance  - 1 x daily - 7 x weekly - 2 sets - 10 reps - yellow band hold - Ankle Eversion with Resistance  - 1 x daily - 7 x weekly - 2 sets - 10 reps - yellow band hold  ASSESSMENT:  CLINICAL IMPRESSION: ***  EVAL: Patient is a 43 y.o. F who was seen today for physical therapy evaluation and treatment s/p L peroneal tendon repair performed on 03/06/2024. Physical findings are consistent with surgery and recovery timeline as pt demonstrates decrease in L  ankle strength, ROM, and stability. LEFS shows severe disability in performance of home ADLs and high level mobility activities post surgery. Pt would benefit from skilled PT services working on improving strength and stability of L ankle post operatively in order to get back to PLOF.   OBJECTIVE IMPAIRMENTS: Abnormal gait, decreased activity tolerance, decreased balance, decreased endurance, decreased mobility, difficulty walking, decreased ROM, decreased strength, and pain.   ACTIVITY LIMITATIONS: carrying, lifting, sitting, standing, squatting, stairs, transfers, bed mobility, and locomotion level  PARTICIPATION LIMITATIONS: meal prep, cleaning, driving, shopping, community activity, occupation, and yard work  PERSONAL FACTORS: Time since onset of injury/illness/exacerbation and 1 comorbidity: L peroneal tendon repair are also affecting patient's functional outcome.   REHAB POTENTIAL: Good  CLINICAL DECISION MAKING: Evolving/moderate complexity  EVALUATION COMPLEXITY: Moderate   GOALS: Goals reviewed with patient? No  SHORT TERM GOALS: Target date: 06/08/2024   Pt will be compliant and knowledgeable with initial HEP for improved comfort and carryover Baseline: initial HEP given  Goal status: INITIAL  2.  Pt will self report left ankle pain no greater than 2/10 for improved comfort and functional ability Baseline: 4/10 at worst Goal status: INITIAL   LONG TERM GOALS: Target date: 07/13/2024   Pt will improve LEFS to no less than 55/80 as proxy for  functional improvement with home ADLs and higher level community activity Baseline: 44/80 Goal status: INITIAL   2.  Pt will self report left foot/ankle pain no greater than 0-1/10 for improved comfort and functional ability Baseline: 4/10 at worst Goal status: INITIAL   3.  Pt will increase 30 Second Sit to Stand rep count to no less than 10 reps for improved balance, strength, and functional mobility Baseline: 3 reps  Goal  status: INITIAL   4.  Pt will improve L SLS to no less than 30 seconds for improved functional stability and decrease pain Baseline: 2 seconds Goal status: INITIAL  5.  Pt will improve L ankle DF AROM to at least 10 degrees for improved gait mechanics and decrease pain Baseline: 0 degrees Goal status: INITIAL   PLAN:  PT FREQUENCY: 2x/week  PT DURATION: 8 weeks  PLANNED INTERVENTIONS: 97164- PT Re-evaluation, 97110-Therapeutic exercises, 97530- Therapeutic activity, [id number]- Neuromuscular re-education, 97535- Self Care, [id number]- Manual therapy, [id number]- Gait training, [phone]- Aquatic Therapy, [phone]- Electrical stimulation (unattended), [phone]- Electrical stimulation (manual), 97016- Vasopneumatic device, Patient/Family education, and Cryotherapy  PLAN FOR NEXT SESSION: assess HEP response   Alm JAYSON Kingdom PT  06/09/24 11:15 AM

## 2024-06-12 ENCOUNTER — Telehealth: Payer: Self-pay

## 2024-06-12 ENCOUNTER — Ambulatory Visit

## 2024-06-12 NOTE — Therapy (Incomplete)
 OUTPATIENT PHYSICAL THERAPY LOWER EXTREMITY TREATMENT   Patient Name: Hannah Fleming MRN: [id number] DOB:November 18, 1980, 43 y.o., female Today's Date: 06/12/2024  END OF SESSION:  PT End of Session - 06/09/24 1115     Visit Number 3    Number of Visits 20    Date for Recertification  07/27/24    Authorization Type [employer] MCD    PT Start Time 1115   arrivd late   PT End TIme 1145   Total Time 30 min          Past Medical History:  Diagnosis Date   Arthritis    RA   Fibroids    Genital herpes    no outbreak in 55yrs   Trichomonas infection    Urticaria    Past Surgical History:  Procedure Laterality Date   DILATATION & CURETTAGE/HYSTEROSCOPY WITH MYOSURE N/A 10/22/2021   Procedure: DILATATION & CURETTAGE/HYSTEROSCOPY WITH MYOSURE;  Surgeon: Zina Jerilynn LABOR, MD;  Location: [facility] [place];  Service: Gynecology;  Laterality: N/A;   DILATION AND CURETTAGE OF UTERUS  2020   DILATION AND EVACUATION N/A 07/09/2022   Procedure: DILATATION AND EVACUATION;  Surgeon: Alger Gong, MD;  Location: [facility];  Service: Gynecology;  Laterality: N/A;   INDUCED ABORTION     peroneal tendon repair left Left 03/07/2024   repair of split tear of peroneus brevis tendon with PRP injection   WISDOM TOOTH EXTRACTION     Patient Active Problem List   Diagnosis Date Noted   Primary osteoarthritis of both hands 09/09/2023   Primary osteoarthritis of both knees 09/09/2023   Primary osteoarthritis of both feet 09/09/2023   Arthropathy of lumbar facet joint 09/09/2023   Other idiopathic scoliosis, lumbar region 09/09/2023   Fibromyalgia syndrome 09/09/2023   History of multiple miscarriages 09/09/2023   Irregular uterine bleeding 05/07/2023   Prediabetes 06/21/2019   Ear itch 03/30/2019   Tonsillar calculus 03/30/2019   Menorrhagia with regular cycle 12/28/2013   Anxiety 02/07/2013   Dysmenorrhea 02/07/2013    PCP: Tanda Bleacher, MD  REFERRING PROVIDER: Loel Awanda BIRCH,  DPM   REFERRING DIAG:  [phone] (ICD-10-CM) - Post-operative state S86.312D (ICD-10-CM) - Tear of peroneal tendon, left, subsequent encounter  THERAPY DIAG:  No diagnosis found.  Rationale for Evaluation and Treatment: Rehabilitation  ONSET DATE: 03/06/2024  SUBJECTIVE:   SUBJECTIVE STATEMENT: Pt presents to PT with 2/10 pain in L ankle. Has been compliant with HEP. She has been with her son who is in the hospital right now.   PERTINENT HISTORY: See PMH  PAIN:  Are you having pain?  Yes: NPRS scale: 3/10 Worst: 4/10 Pain location: L lateral ankle Pain description: sore Aggravating factors: prolonged standing, sitting in dependent  Relieving factors: elevation, ice/compression  PRECAUTIONS: None  RED FLAGS: None   WEIGHT BEARING RESTRICTIONS: No  FALLS:  Has patient fallen in last 6 months? No  LIVING ENVIRONMENT: Lives with: lives with their family Lives in: House/apartment Has following equipment at home: Crutches and CAM boot, knee scooter, ASO brace  OCCUPATION: [employer]  PLOF: Independent  PATIENT GOALS: get back to PLOF, decrease pain, improve comfort with walking post surgery  NEXT MD VISIT: 05/29/2024  OBJECTIVE:  Note: Objective measures were completed at Evaluation unless otherwise noted.  DIAGNOSTIC FINDINGS: N/A  PATIENT SURVEYS:  LEFS  Extreme difficulty/unable (0), Quite a bit of difficulty (1), Moderate difficulty (2), Little difficulty (3), No difficulty (4) Survey date:  05/18/2024  Any of your usual work,  housework or school activities 4  2. Usual hobbies, recreational or sporting activities 1  3. Getting into/out of the bath 3  4. Walking between rooms 3  5. Putting on socks/shoes 3  6. Squatting  4  7. Lifting an object, like a bag of groceries from the floor 4  8. Performing light activities around your home 4  9. Performing heavy activities around your home 1  10. Getting into/out of a car 3  11. Walking 2 blocks 0   12. Walking 1 mile 0  13. Going up/down 10 stairs (1 flight) 3  14. Standing for 1 hour 1  15.  sitting for 1 hour 3  16. Running on even ground 0  17. Running on uneven ground 0  18. Making sharp turns while running fast 0  19. Hopping  0  20. Rolling over in bed 4  Score total:  44/80     COGNITION: Overall cognitive status: Within functional limits for tasks assessed     SENSATION: WFL  EDEMA:  Circumferential: R 24.5cm; L 26.5cm  POSTURE: rounded shoulders, forward head, and flexed trunk   PALPATION: TTP to L lateral ankle, sensitivity to L incisional site  LOWER EXTREMITY ROM:  Active ROM Right eval Left eval Left 06/01/24  Hip flexion     Hip extension     Hip abduction     Hip adduction     Hip internal rotation     Hip external rotation     Knee flexion     Knee extension     Ankle dorsiflexion 11 0 8  Ankle plantarflexion WNL WNL   Ankle inversion WNL 10   Ankle eversion WNL 3    (Blank rows = not tested)  LOWER EXTREMITY MMT:  MMT Right eval Left eval  Hip flexion    Hip extension    Hip abduction    Hip adduction    Hip internal rotation    Hip external rotation    Knee flexion    Knee extension    Ankle dorsiflexion 5 2+  Ankle plantarflexion 5 2+  Ankle inversion 5 2+  Ankle eversion 5 2+   (Blank rows = not tested)  LOWER EXTREMITY SPECIAL TESTS:  DNT  FUNCTIONAL TESTS:  30 Second Sit to Stand: 3 reps with UE SLS: L - 2 sec  GAIT: Distance walked: 16ft Assistive device utilized: CAM boot Level of assistance: Modified independence Comments: severe antalgic gait L   TREATMENT: [facility] Adult PT Treatment:                                                DATE: 06/09/24 Therapeutic Exercise: Long sitting ankle DF/inv/ev 2x10 YTB Seated heel raise with 10# KB 2x10 L STS 2x10 - high table no UE Ankle DF/inv/ev TB 2x10 L Tandem stance 2x30 L back  [facility] Adult PT Treatment:                                                DATE:  06/01/24 Therapeutic Exercise: Seated ankle circles CW/CCW Seated ankle pumps  Seated Inversion/ eversion AROM  Seated heel raises, seated tow raises  Seated towel slides inv / ev  Supine SLR x  5 each   [facility] Adult PT Treatment:                                                DATE: 05/18/2024 Therapeutic Exercise: Ankle inv/ev AROM x 10 L Ankle pumps x 20 Long sitting calf stretch with towel x 30 L Seated heel raise x 15   PATIENT EDUCATION:  Education details: eval findings, LEFS, HEP, POC Person educated: Patient Education method: Explanation, Demonstration, and Handouts Education comprehension: verbalized understanding and returned demonstration  HOME EXERCISE PROGRAM: Access Code: [email or web address] URL: [email or web address] Date: 06/09/2024 Prepared by: Alm Kingdom  Exercises - Supine Ankle Inversion AROM  - 2-3 x daily - 7 x weekly - 3 sets - 10 reps - Supine Ankle Eversion AROM  - 2-3 x daily - 7 x weekly - 3 sets - 10 reps - Supine Ankle Pumps  - 2-3 x daily - 7 x weekly - 2-3 sets - 20 reps - Long Sitting Calf Stretch with Strap  - 2-3 x daily - 7 x weekly - 2-3 reps - 30 sec hold - Seated Heel Raise  - 2-3 x daily - 7 x weekly - 3 sets - 15 reps - Ankle Inversion Eversion Towel Slide  - 1 x daily - 7 x weekly - 3 sets - 3 reps - Ankle Dorsiflexion with Resistance  - 1 x daily - 7 x weekly - 2 sets - 10 reps - yellow hold - Ankle Inversion with Resistance  - 1 x daily - 7 x weekly - 2 sets - 10 reps - yellow band hold - Ankle Eversion with Resistance  - 1 x daily - 7 x weekly - 2 sets - 10 reps - yellow band hold  ASSESSMENT:  CLINICAL IMPRESSION: Pt able to complete prescribed exercises with no adverse effect. Progressed ankle strengthening and balance work today which was tolerated well. Continues to benefit from skilled PT, will progress as able.   EVAL: Patient is a 43 y.o. F who was seen today for physical therapy evaluation and treatment s/p L peroneal  tendon repair performed on 03/06/2024. Physical findings are consistent with surgery and recovery timeline as pt demonstrates decrease in L ankle strength, ROM, and stability. LEFS shows severe disability in performance of home ADLs and high level mobility activities post surgery. Pt would benefit from skilled PT services working on improving strength and stability of L ankle post operatively in order to get back to PLOF.   OBJECTIVE IMPAIRMENTS: Abnormal gait, decreased activity tolerance, decreased balance, decreased endurance, decreased mobility, difficulty walking, decreased ROM, decreased strength, and pain.   ACTIVITY LIMITATIONS: carrying, lifting, sitting, standing, squatting, stairs, transfers, bed mobility, and locomotion level  PARTICIPATION LIMITATIONS: meal prep, cleaning, driving, shopping, community activity, occupation, and yard work  PERSONAL FACTORS: Time since onset of injury/illness/exacerbation and 1 comorbidity: L peroneal tendon repair are also affecting patient's functional outcome.   REHAB POTENTIAL: Good  CLINICAL DECISION MAKING: Evolving/moderate complexity  EVALUATION COMPLEXITY: Moderate   GOALS: Goals reviewed with patient? No  SHORT TERM GOALS: Target date: 06/08/2024   Pt will be compliant and knowledgeable with initial HEP for improved comfort and carryover Baseline: initial HEP given  Goal status: INITIAL  2.  Pt will self report left ankle pain no greater than 2/10 for improved comfort and functional ability Baseline: 4/10 at worst Goal status:  INITIAL   LONG TERM GOALS: Target date: 07/13/2024   Pt will improve LEFS to no less than 55/80 as proxy for functional improvement with home ADLs and higher level community activity Baseline: 44/80 Goal status: INITIAL   2.  Pt will self report left foot/ankle pain no greater than 0-1/10 for improved comfort and functional ability Baseline: 4/10 at worst Goal status: INITIAL   3.  Pt will increase 30  Second Sit to Stand rep count to no less than 10 reps for improved balance, strength, and functional mobility Baseline: 3 reps  Goal status: INITIAL   4.  Pt will improve L SLS to no less than 30 seconds for improved functional stability and decrease pain Baseline: 2 seconds Goal status: INITIAL  5.  Pt will improve L ankle DF AROM to at least 10 degrees for improved gait mechanics and decrease pain Baseline: 0 degrees Goal status: INITIAL   PLAN:  PT FREQUENCY: 2x/week  PT DURATION: 8 weeks  PLANNED INTERVENTIONS: 97164- PT Re-evaluation, 97110-Therapeutic exercises, 97530- Therapeutic activity, [id number]- Neuromuscular re-education, 97535- Self Care, 02859- Manual therapy, [id number]- Gait training, [phone]- Aquatic Therapy, [phone]- Electrical stimulation (unattended), [phone]- Electrical stimulation (manual), 97016- Vasopneumatic device, Patient/Family education, and Cryotherapy  PLAN FOR NEXT SESSION: assess HEP response   Alm JAYSON Kingdom PT  06/12/24 7:47 AM

## 2024-06-12 NOTE — Telephone Encounter (Signed)
 PT called and spoke with patient regarding missed visit. Patient is with her son in hospital and was with him and thought her appointment was later in week. Confirmed next appointment.   Alm JAYSON Kingdom   06/12/24 3:59 PM

## 2024-06-15 ENCOUNTER — Ambulatory Visit: Admitting: Physical Therapy

## 2024-06-15 ENCOUNTER — Encounter: Payer: Self-pay | Admitting: Physical Therapy

## 2024-06-15 DIAGNOSIS — M6281 Muscle weakness (generalized): Secondary | ICD-10-CM

## 2024-06-15 DIAGNOSIS — M25572 Pain in left ankle and joints of left foot: Secondary | ICD-10-CM | POA: Diagnosis not present

## 2024-06-15 NOTE — Therapy (Addendum)
 " OUTPATIENT PHYSICAL THERAPY TREATMENT NOTE/DISCHARGE  PHYSICAL THERAPY DISCHARGE SUMMARY  Visits from Start of Care: 4  Current functional level related to goals / functional outcomes: See goals/objective   Remaining deficits: Unable to assess   Education / Equipment: HEP   Patient agrees to discharge. Patient goals were unable to assess. Patient is being discharged due to not returning since the last visit.     Patient Name: Hannah Fleming MRN: [id number] DOB:1981/02/11, 43 y.o., female Today's Date: 06/15/2024   PT End of Session - 06/15/24 0934     Visit Number 4    Number of Visits 20    Date for Recertification  07/27/24    Authorization Type [employer] MCD    PT Start Time 0934    PT Stop Time 1015    PT Time Calculation (min) 41 min             Past Medical History:  Diagnosis Date   Arthritis    RA   Fibroids    Genital herpes    no outbreak in 80yrs   Trichomonas infection    Urticaria    Past Surgical History:  Procedure Laterality Date   DILATATION & CURETTAGE/HYSTEROSCOPY WITH MYOSURE N/A 10/22/2021   Procedure: DILATATION & CURETTAGE/HYSTEROSCOPY WITH MYOSURE;  Surgeon: Zina Jerilynn LABOR, MD;  Location: [facility];  Service: Gynecology;  Laterality: N/A;   DILATION AND CURETTAGE OF UTERUS  2020   DILATION AND EVACUATION N/A 07/09/2022   Procedure: DILATATION AND EVACUATION;  Surgeon: Alger Gong, MD;  Location: [facility];  Service: Gynecology;  Laterality: N/A;   INDUCED ABORTION     peroneal tendon repair left Left 03/07/2024   repair of split tear of peroneus brevis tendon with PRP injection   WISDOM TOOTH EXTRACTION     Patient Active Problem List   Diagnosis Date Noted   Primary osteoarthritis of both hands 09/09/2023   Primary osteoarthritis of both knees 09/09/2023   Primary osteoarthritis of both feet 09/09/2023   Arthropathy of lumbar facet joint 09/09/2023   Other idiopathic scoliosis, lumbar region 09/09/2023    Fibromyalgia syndrome 09/09/2023   History of multiple miscarriages 09/09/2023   Irregular uterine bleeding 05/07/2023   Prediabetes 06/21/2019   Ear itch 03/30/2019   Tonsillar calculus 03/30/2019   Menorrhagia with regular cycle 12/28/2013   Anxiety 02/07/2013   Dysmenorrhea 02/07/2013    PCP: Tanda Bleacher, MD  REFERRING PROVIDER: Loel Awanda BIRCH, DPM   REFERRING DIAG:  [phone] (ICD-10-CM) - Post-operative state S86.312D (ICD-10-CM) - Tear of peroneal tendon, left, subsequent encounter  THERAPY DIAG:  Pain in left ankle and joints of left foot  Muscle weakness (generalized)  Rationale for Evaluation and Treatment: Rehabilitation  ONSET DATE: 03/06/2024  SUBJECTIVE:   SUBJECTIVE STATEMENT: Pt presents to PT with 2-3/10 pain in L ankle. Has been compliant with HEP. She has been with her son who is still in the hospital right now.   PERTINENT HISTORY: See PMH  PAIN:  Are you having pain?  Yes: NPRS scale: 3/10 Worst: 4/10 Pain location: L lateral ankle Pain description: sore Aggravating factors: prolonged standing, sitting in dependent  Relieving factors: elevation, ice/compression  PRECAUTIONS: None  RED FLAGS: None   WEIGHT BEARING RESTRICTIONS: No  FALLS:  Has patient fallen in last 6 months? No  LIVING ENVIRONMENT: Lives with: lives with their family Lives in: House/apartment Has following equipment at home: Crutches and CAM boot, knee scooter, ASO brace  OCCUPATION: [employer]  PLOF: Independent  PATIENT GOALS: get back to PLOF, decrease pain, improve comfort with walking post surgery  NEXT MD VISIT: 05/29/2024  OBJECTIVE:  Note: Objective measures were completed at Evaluation unless otherwise noted.  DIAGNOSTIC FINDINGS: N/A  PATIENT SURVEYS:  LEFS  Extreme difficulty/unable (0), Quite a bit of difficulty (1), Moderate difficulty (2), Little difficulty (3), No difficulty (4) Survey date:  05/18/2024  Any of your usual work,  housework or school activities 4  2. Usual hobbies, recreational or sporting activities 1  3. Getting into/out of the bath 3  4. Walking between rooms 3  5. Putting on socks/shoes 3  6. Squatting  4  7. Lifting an object, like a bag of groceries from the floor 4  8. Performing light activities around your home 4  9. Performing heavy activities around your home 1  10. Getting into/out of a car 3  11. Walking 2 blocks 0  12. Walking 1 mile 0  13. Going up/down 10 stairs (1 flight) 3  14. Standing for 1 hour 1  15.  sitting for 1 hour 3  16. Running on even ground 0  17. Running on uneven ground 0  18. Making sharp turns while running fast 0  19. Hopping  0  20. Rolling over in bed 4  Score total:  44/80     COGNITION: Overall cognitive status: Within functional limits for tasks assessed     SENSATION: WFL  EDEMA:  Circumferential: R 24.5cm; L 26.5cm  POSTURE: rounded shoulders, forward head, and flexed trunk   PALPATION: TTP to L lateral ankle, sensitivity to L incisional site  LOWER EXTREMITY ROM:  Active ROM Right eval Left eval Left 06/01/24  Hip flexion     Hip extension     Hip abduction     Hip adduction     Hip internal rotation     Hip external rotation     Knee flexion     Knee extension     Ankle dorsiflexion 11 0 8  Ankle plantarflexion WNL WNL   Ankle inversion WNL 10   Ankle eversion WNL 3    (Blank rows = not tested)  LOWER EXTREMITY MMT:  MMT Right eval Left eval Left 05/25/24  Hip flexion     Hip extension     Hip abduction     Hip adduction     Hip internal rotation     Hip external rotation     Knee flexion     Knee extension     Ankle dorsiflexion 5 2+ 5  Ankle plantarflexion 5 2+   Ankle inversion 5 2+ 4+  Ankle eversion 5 2+ 3+   (Blank rows = not tested)  LOWER EXTREMITY SPECIAL TESTS:  DNT  FUNCTIONAL TESTS:  30 Second Sit to Stand: 3 reps with UE SLS: L - 2 sec  GAIT: Distance walked: 58ft Assistive device  utilized: CAM boot Level of assistance: Modified independence Comments: severe antalgic gait L   TREATMENT: [facility] Adult PT Treatment:                                                DATE: 06/15/24 Therapeutic Exercise/Activity Seated heel raise Seated toe raise Inv/ev towel slide  Seated Baps L2 CW CCW Standing heel raise x 10 Standing wooden rocker board  Tandem stance L back with  head turns Supported squat x 10   Hip abduction x 10 each  Updated HEP    [facility] Adult PT Treatment:                                                DATE: 06/09/24 Therapeutic Exercise: Long sitting ankle DF/inv/ev 2x10 YTB Seated heel raise with 10# KB 2x10 L STS 2x10 - high table no UE Ankle DF/inv/ev TB 2x10 L Tandem stance 2x30 L back  [facility] Adult PT Treatment:                                                DATE: 06/01/24 Therapeutic Exercise: Seated ankle circles CW/CCW Seated ankle pumps  Seated Inversion/ eversion AROM  Seated heel raises, seated tow raises  Seated towel slides inv / ev  Supine SLR x 5 each   [facility] Adult PT Treatment:                                                DATE: 05/18/2024 Therapeutic Exercise: Ankle inv/ev AROM x 10 L Ankle pumps x 20 Long sitting calf stretch with towel x 30 L Seated heel raise x 15   PATIENT EDUCATION:  Education details: eval findings, LEFS, HEP, POC Person educated: Patient Education method: Explanation, Demonstration, and Handouts Education comprehension: verbalized understanding and returned demonstration  HOME EXERCISE PROGRAM: Access Code: [email or web address] URL: [email or web address] Date: 06/09/2024 Prepared by: Alm Kingdom  Exercises - Supine Ankle Inversion AROM  - 2-3 x daily - 7 x weekly - 3 sets - 10 reps - Supine Ankle Eversion AROM  - 2-3 x daily - 7 x weekly - 3 sets - 10 reps - Supine Ankle Pumps  - 2-3 x daily - 7 x weekly - 2-3 sets - 20 reps - Long Sitting Calf Stretch with Strap  - 2-3 x daily - 7 x weekly -  2-3 reps - 30 sec hold - Seated Heel Raise  - 2-3 x daily - 7 x weekly - 3 sets - 15 reps - Ankle Inversion Eversion Towel Slide  - 1 x daily - 7 x weekly - 3 sets - 3 reps - Ankle Dorsiflexion with Resistance  - 1 x daily - 7 x weekly - 2 sets - 10 reps - yellow hold - Ankle Inversion with Resistance  - 1 x daily - 7 x weekly - 2 sets - 10 reps - yellow band hold - Ankle Eversion with Resistance  - 1 x daily - 7 x weekly - 2 sets - 10 reps - yellow band hold Added 06/15/24 - Heel Raises with Counter Support  - 1 x daily - 7 x weekly - 2 sets - 10 reps - Standing Tandem Balance with Counter Support  - 1 x daily - 7 x weekly - 1 sets - 3 reps - 30 hold  ASSESSMENT:  CLINICAL IMPRESSION: Pt able to complete prescribed exercises with no adverse effect. Progressed ankle strengthening and balance work today which was tolerated well. Updated HEP with closed chain.Ankle strength improved.  Continues to benefit from skilled  PT, will progress as able.   EVAL: Patient is a 43 y.o. F who was seen today for physical therapy evaluation and treatment s/p L peroneal tendon repair performed on 03/06/2024. Physical findings are consistent with surgery and recovery timeline as pt demonstrates decrease in L ankle strength, ROM, and stability. LEFS shows severe disability in performance of home ADLs and high level mobility activities post surgery. Pt would benefit from skilled PT services working on improving strength and stability of L ankle post operatively in order to get back to PLOF.   OBJECTIVE IMPAIRMENTS: Abnormal gait, decreased activity tolerance, decreased balance, decreased endurance, decreased mobility, difficulty walking, decreased ROM, decreased strength, and pain.   ACTIVITY LIMITATIONS: carrying, lifting, sitting, standing, squatting, stairs, transfers, bed mobility, and locomotion level  PARTICIPATION LIMITATIONS: meal prep, cleaning, driving, shopping, community activity, occupation, and yard  work  PERSONAL FACTORS: Time since onset of injury/illness/exacerbation and 1 comorbidity: L peroneal tendon repair are also affecting patient's functional outcome.   REHAB POTENTIAL: Good  CLINICAL DECISION MAKING: Evolving/moderate complexity  EVALUATION COMPLEXITY: Moderate   GOALS: Goals reviewed with patient? No  SHORT TERM GOALS: Target date: 06/08/2024   Pt will be compliant and knowledgeable with initial HEP for improved comfort and carryover Baseline: initial HEP given  Goal status: MET  2.  Pt will self report left ankle pain no greater than 2/10 for improved comfort and functional ability Baseline: 4/10 at worst 06/15/24: 2-3/10 Goal status: ONGOING  LONG TERM GOALS: Target date: 07/13/2024   Pt will improve LEFS to no less than 55/80 as proxy for functional improvement with home ADLs and higher level community activity Baseline: 44/80 Goal status: INITIAL   2.  Pt will self report left foot/ankle pain no greater than 0-1/10 for improved comfort and functional ability Baseline: 4/10 at worst Goal status: INITIAL   3.  Pt will increase 30 Second Sit to Stand rep count to no less than 10 reps for improved balance, strength, and functional mobility Baseline: 3 reps  Goal status: INITIAL   4.  Pt will improve L SLS to no less than 30 seconds for improved functional stability and decrease pain Baseline: 2 seconds Goal status: INITIAL  5.  Pt will improve L ankle DF AROM to at least 10 degrees for improved gait mechanics and decrease pain Baseline: 0 degrees Goal status: INITIAL   PLAN:  PT FREQUENCY: 2x/week  PT DURATION: 8 weeks  PLANNED INTERVENTIONS: 97164- PT Re-evaluation, 97110-Therapeutic exercises, 97530- Therapeutic activity, [id number]- Neuromuscular re-education, 97535- Self Care, [id number]- Manual therapy, [id number]- Gait training, [phone]- Aquatic Therapy, [phone]- Electrical stimulation (unattended), [phone]- Electrical stimulation (manual), 97016- Vasopneumatic  device, Patient/Family education, and Cryotherapy  PLAN FOR NEXT SESSION: assess HEP response   Harlene CHRISTELLA Persons PTA  06/15/24 10:18 AM   "

## 2024-06-19 ENCOUNTER — Ambulatory Visit

## 2024-06-19 NOTE — Therapy (Incomplete)
 OUTPATIENT PHYSICAL THERAPY LOWER EXTREMITY TREATMENT   Patient Name: Hannah Fleming MRN: [id number] DOB:1981-03-10, 43 y.o., female Today's Date: 06/19/2024        Past Medical History:  Diagnosis Date   Arthritis    RA   Fibroids    Genital herpes    no outbreak in 75yrs   Trichomonas infection    Urticaria    Past Surgical History:  Procedure Laterality Date   DILATATION & CURETTAGE/HYSTEROSCOPY WITH MYOSURE N/A 10/22/2021   Procedure: DILATATION & CURETTAGE/HYSTEROSCOPY WITH MYOSURE;  Surgeon: Zina Jerilynn LABOR, MD;  Location: [facility];  Service: Gynecology;  Laterality: N/A;   DILATION AND CURETTAGE OF UTERUS  2020   DILATION AND EVACUATION N/A 07/09/2022   Procedure: DILATATION AND EVACUATION;  Surgeon: Alger Gong, MD;  Location: [facility];  Service: Gynecology;  Laterality: N/A;   INDUCED ABORTION     peroneal tendon repair left Left 03/07/2024   repair of split tear of peroneus brevis tendon with PRP injection   WISDOM TOOTH EXTRACTION     Patient Active Problem List   Diagnosis Date Noted   Primary osteoarthritis of both hands 09/09/2023   Primary osteoarthritis of both knees 09/09/2023   Primary osteoarthritis of both feet 09/09/2023   Arthropathy of lumbar facet joint 09/09/2023   Other idiopathic scoliosis, lumbar region 09/09/2023   Fibromyalgia syndrome 09/09/2023   History of multiple miscarriages 09/09/2023   Irregular uterine bleeding 05/07/2023   Prediabetes 06/21/2019   Ear itch 03/30/2019   Tonsillar calculus 03/30/2019   Menorrhagia with regular cycle 12/28/2013   Anxiety 02/07/2013   Dysmenorrhea 02/07/2013    PCP: Tanda Bleacher, MD  REFERRING PROVIDER: Loel Awanda BIRCH, DPM   REFERRING DIAG:  [phone] (ICD-10-CM) - Post-operative state S86.312D (ICD-10-CM) - Tear of peroneal tendon, left, subsequent encounter  THERAPY DIAG:  No diagnosis found.  Rationale for Evaluation and Treatment: Rehabilitation  ONSET  DATE: 03/06/2024  SUBJECTIVE:   SUBJECTIVE STATEMENT: ***  PERTINENT HISTORY: See PMH  PAIN:  Are you having pain?  Yes: NPRS scale: 3/10 Worst: 4/10 Pain location: L lateral ankle Pain description: sore Aggravating factors: prolonged standing, sitting in dependent  Relieving factors: elevation, ice/compression  PRECAUTIONS: None  RED FLAGS: None   WEIGHT BEARING RESTRICTIONS: No  FALLS:  Has patient fallen in last 6 months? No  LIVING ENVIRONMENT: Lives with: lives with their family Lives in: House/apartment Has following equipment at home: Crutches and CAM boot, knee scooter, ASO brace  OCCUPATION: [employer]  PLOF: Independent  PATIENT GOALS: get back to PLOF, decrease pain, improve comfort with walking post surgery  NEXT MD VISIT: 05/29/2024  OBJECTIVE:  Note: Objective measures were completed at Evaluation unless otherwise noted.  DIAGNOSTIC FINDINGS: N/A  PATIENT SURVEYS:  LEFS  Extreme difficulty/unable (0), Quite a bit of difficulty (1), Moderate difficulty (2), Little difficulty (3), No difficulty (4) Survey date:  05/18/2024  Any of your usual work, housework or school activities 4  2. Usual hobbies, recreational or sporting activities 1  3. Getting into/out of the bath 3  4. Walking between rooms 3  5. Putting on socks/shoes 3  6. Squatting  4  7. Lifting an object, like a bag of groceries from the floor 4  8. Performing light activities around your home 4  9. Performing heavy activities around your home 1  10. Getting into/out of a car 3  11. Walking 2 blocks 0  12. Walking 1 mile 0  13. Going  up/down 10 stairs (1 flight) 3  14. Standing for 1 hour 1  15.  sitting for 1 hour 3  16. Running on even ground 0  17. Running on uneven ground 0  18. Making sharp turns while running fast 0  19. Hopping  0  20. Rolling over in bed 4  Score total:  44/80     COGNITION: Overall cognitive status: Within functional limits for tasks  assessed     SENSATION: WFL  EDEMA:  Circumferential: R 24.5cm; L 26.5cm  POSTURE: rounded shoulders, forward head, and flexed trunk   PALPATION: TTP to L lateral ankle, sensitivity to L incisional site  LOWER EXTREMITY ROM:  Active ROM Right eval Left eval Left 06/01/24  Hip flexion     Hip extension     Hip abduction     Hip adduction     Hip internal rotation     Hip external rotation     Knee flexion     Knee extension     Ankle dorsiflexion 11 0 8  Ankle plantarflexion WNL WNL   Ankle inversion WNL 10   Ankle eversion WNL 3    (Blank rows = not tested)  LOWER EXTREMITY MMT:  MMT Right eval Left eval Left 05/25/24  Hip flexion     Hip extension     Hip abduction     Hip adduction     Hip internal rotation     Hip external rotation     Knee flexion     Knee extension     Ankle dorsiflexion 5 2+ 5  Ankle plantarflexion 5 2+   Ankle inversion 5 2+ 4+  Ankle eversion 5 2+ 3+   (Blank rows = not tested)  LOWER EXTREMITY SPECIAL TESTS:  DNT  FUNCTIONAL TESTS:  30 Second Sit to Stand: 3 reps with UE SLS: L - 2 sec  GAIT: Distance walked: 44ft Assistive device utilized: CAM boot Level of assistance: Modified independence Comments: severe antalgic gait L   TREATMENT: [facility] Adult PT Treatment:                                                DATE: 06/19/24 Therapeutic Exercise/Activity Seated heel raise Seated toe raise Inv/ev towel slide  Seated Baps L2 CW CCW Standing heel raise x 10 Standing wooden rocker board  Tandem stance L back with head turns Supported squat x 10   Hip abduction x 10 each  Updated HEP  [facility] Adult PT Treatment:                                                DATE: 06/15/24 Therapeutic Exercise/Activity Seated heel raise Seated toe raise Inv/ev towel slide  Seated Baps L2 CW CCW Standing heel raise x 10 Standing wooden rocker board  Tandem stance L back with head turns Supported squat x 10   Hip abduction x 10 each   Updated HEP    [facility] Adult PT Treatment:  DATE: 06/09/24 Therapeutic Exercise: Long sitting ankle DF/inv/ev 2x10 YTB Seated heel raise with 10# KB 2x10 L STS 2x10 - high table no UE Ankle DF/inv/ev TB 2x10 L Tandem stance 2x30 L back  [facility] Adult PT Treatment:                                                DATE: 06/01/24 Therapeutic Exercise: Seated ankle circles CW/CCW Seated ankle pumps  Seated Inversion/ eversion AROM  Seated heel raises, seated tow raises  Seated towel slides inv / ev  Supine SLR x 5 each   [facility] Adult PT Treatment:                                                DATE: 05/18/2024 Therapeutic Exercise: Ankle inv/ev AROM x 10 L Ankle pumps x 20 Long sitting calf stretch with towel x 30 L Seated heel raise x 15   PATIENT EDUCATION:  Education details: eval findings, LEFS, HEP, POC Person educated: Patient Education method: Explanation, Demonstration, and Handouts Education comprehension: verbalized understanding and returned demonstration  HOME EXERCISE PROGRAM: Access Code: [email or web address] URL: [email or web address] Date: 06/09/2024 Prepared by: Alm Kingdom  Exercises - Supine Ankle Inversion AROM  - 2-3 x daily - 7 x weekly - 3 sets - 10 reps - Supine Ankle Eversion AROM  - 2-3 x daily - 7 x weekly - 3 sets - 10 reps - Supine Ankle Pumps  - 2-3 x daily - 7 x weekly - 2-3 sets - 20 reps - Long Sitting Calf Stretch with Strap  - 2-3 x daily - 7 x weekly - 2-3 reps - 30 sec hold - Seated Heel Raise  - 2-3 x daily - 7 x weekly - 3 sets - 15 reps - Ankle Inversion Eversion Towel Slide  - 1 x daily - 7 x weekly - 3 sets - 3 reps - Ankle Dorsiflexion with Resistance  - 1 x daily - 7 x weekly - 2 sets - 10 reps - yellow hold - Ankle Inversion with Resistance  - 1 x daily - 7 x weekly - 2 sets - 10 reps - yellow band hold - Ankle Eversion with Resistance  - 1 x daily - 7 x weekly - 2 sets - 10 reps -  yellow band hold Added 06/15/24 - Heel Raises with Counter Support  - 1 x daily - 7 x weekly - 2 sets - 10 reps - Standing Tandem Balance with Counter Support  - 1 x daily - 7 x weekly - 1 sets - 3 reps - 30 hold  ASSESSMENT:  CLINICAL IMPRESSION: ***  EVAL: Patient is a 43 y.o. F who was seen today for physical therapy evaluation and treatment s/p L peroneal tendon repair performed on 03/06/2024. Physical findings are consistent with surgery and recovery timeline as pt demonstrates decrease in L ankle strength, ROM, and stability. LEFS shows severe disability in performance of home ADLs and high level mobility activities post surgery. Pt would benefit from skilled PT services working on improving strength and stability of L ankle post operatively in order to get back to PLOF.   OBJECTIVE IMPAIRMENTS: Abnormal gait, decreased activity tolerance, decreased balance, decreased endurance, decreased mobility, difficulty  walking, decreased ROM, decreased strength, and pain.   ACTIVITY LIMITATIONS: carrying, lifting, sitting, standing, squatting, stairs, transfers, bed mobility, and locomotion level  PARTICIPATION LIMITATIONS: meal prep, cleaning, driving, shopping, community activity, occupation, and yard work  PERSONAL FACTORS: Time since onset of injury/illness/exacerbation and 1 comorbidity: L peroneal tendon repair are also affecting patient's functional outcome.   REHAB POTENTIAL: Good  CLINICAL DECISION MAKING: Evolving/moderate complexity  EVALUATION COMPLEXITY: Moderate   GOALS: Goals reviewed with patient? No  SHORT TERM GOALS: Target date: 06/08/2024   Pt will be compliant and knowledgeable with initial HEP for improved comfort and carryover Baseline: initial HEP given  Goal status: MET  2.  Pt will self report left ankle pain no greater than 2/10 for improved comfort and functional ability Baseline: 4/10 at worst 06/15/24: 2-3/10 Goal status: ONGOING  LONG TERM GOALS:  Target date: 07/13/2024   Pt will improve LEFS to no less than 55/80 as proxy for functional improvement with home ADLs and higher level community activity Baseline: 44/80 Goal status: INITIAL   2.  Pt will self report left foot/ankle pain no greater than 0-1/10 for improved comfort and functional ability Baseline: 4/10 at worst Goal status: INITIAL   3.  Pt will increase 30 Second Sit to Stand rep count to no less than 10 reps for improved balance, strength, and functional mobility Baseline: 3 reps  Goal status: INITIAL   4.  Pt will improve L SLS to no less than 30 seconds for improved functional stability and decrease pain Baseline: 2 seconds Goal status: INITIAL  5.  Pt will improve L ankle DF AROM to at least 10 degrees for improved gait mechanics and decrease pain Baseline: 0 degrees Goal status: INITIAL   PLAN:  PT FREQUENCY: 2x/week  PT DURATION: 8 weeks  PLANNED INTERVENTIONS: 97164- PT Re-evaluation, 97110-Therapeutic exercises, 97530- Therapeutic activity, [id number]- Neuromuscular re-education, 97535- Self Care, [id number]- Manual therapy, [id number]- Gait training, [phone]- Aquatic Therapy, [phone]- Electrical stimulation (unattended), [phone]- Electrical stimulation (manual), 97016- Vasopneumatic device, Patient/Family education, and Cryotherapy  PLAN FOR NEXT SESSION: assess HEP response   Alm JAYSON Kingdom PT  06/19/24 8:14 AM

## 2024-06-26 ENCOUNTER — Encounter: Payer: Self-pay | Admitting: Podiatry

## 2024-06-26 ENCOUNTER — Ambulatory Visit: Admitting: Podiatry

## 2024-06-26 DIAGNOSIS — L905 Scar conditions and fibrosis of skin: Secondary | ICD-10-CM

## 2024-06-26 DIAGNOSIS — Z9889 Other specified postprocedural states: Secondary | ICD-10-CM | POA: Diagnosis not present

## 2024-06-26 MED ORDER — MELOXICAM 15 MG PO TABS: 15.0000 mg | ORAL_TABLET | Freq: Every day | ORAL | 2 refills | Status: AC

## 2024-06-26 NOTE — Progress Notes (Unsigned)
     Chief Complaint  Patient presents with   Foot Pain    recheck L peroneal tendon after P.T. 90 % better. 0 pain at the present. Non diabetic. Using Mederma and wearing triloc brace.   HPI: 43 y.o. female presents today for follow-up of left peroneal tendon repair.  She feels that she has had improvement and feels that she is 90% better.  She did request a renewal on her meloxicam .  She states that the physical therapist at [facility] health and is not performing any ultrasound on the area and she feels that she has now plateaued with her recovery.  Past Medical History:  Diagnosis Date   Arthritis    RA   Fibroids    Genital herpes    no outbreak in 47yrs   Trichomonas infection    Urticaria    Past Surgical History:  Procedure Laterality Date   DILATATION & CURETTAGE/HYSTEROSCOPY WITH MYOSURE N/A 10/22/2021   Procedure: DILATATION & CURETTAGE/HYSTEROSCOPY WITH MYOSURE;  Surgeon: Zina Jerilynn LABOR, MD;  Location: [facility];  Service: Gynecology;  Laterality: N/A;   DILATION AND CURETTAGE OF UTERUS  2020   DILATION AND EVACUATION N/A 07/09/2022   Procedure: DILATATION AND EVACUATION;  Surgeon: Alger Gong, MD;  Location: [facility];  Service: Gynecology;  Laterality: N/A;   INDUCED ABORTION     peroneal tendon repair left Left 03/07/2024   repair of split tear of peroneus brevis tendon with PRP injection   WISDOM TOOTH EXTRACTION     Allergies  Allergen Reactions   Prilosec Otc [Omeprazole Magnesium] Hives   Latex Itching and Rash    Physical Exam: Palpable pedal pulses.  No significant edema is appreciated.  Some discomfort is noted overlying the scar in the central one third aspect.  Does appear to be some adhesions to the scar in this area with deeper underlying soft tissue structures.  Minimal hypertrophy and scar.  She is able to provide an eversion force against resistance.  No gaps or nodules are noted within the peroneal tendons.  She does have good strength  with the peroneal tendons when isolated during testing.  No paresthesia noted in the area.  Assessment/Plan of Care: 1. Scar tissue   2. Post-operative state     Meds ordered this encounter  Medications   meloxicam  (MOBIC ) 15 MG tablet    Sig: Take 1 tablet (15 mg total) by mouth daily.    Dispense:  30 tablet    Refill:  2   Discussed findings with patient today.  We were in agreement to placed a referral to a different physical therapy facility that does perform ultrasound to work on scar tissue.  I feel like patient could improve more with the ultrasound to break up any scar tissue and also help with the fibers of the peroneal tendon repair quicker with the use of ultrasound.  Follow-up as needed after PT   Treyvone Chelf DSABRA Imperial, DPM, FACFAS [facility]     [address] [place], [place] [address]                Office [phone]  Fax [phone]

## 2024-07-03 ENCOUNTER — Ambulatory Visit: Attending: Podiatry | Admitting: Physical Therapy

## 2024-07-26 NOTE — Progress Notes (Signed)
 Office Visit Note  Patient: Hannah Fleming             Date of Birth: 04-Sep-1980           MRN: [id number]             PCP: Tanda Bleacher, MD Referring: Tanda Bleacher, MD Visit Date: 08/08/2024 Occupation: Data Unavailable  Subjective:  Pain in joints  History of Present Illness: Hannah Fleming is a 43 y.o. female with osteoarthritis, myofascial pain syndrome and positive RNP.  She returns today after her last visit in January 2025.  She states after her last visit she continued to have pain and discomfort in her left ankle and left foot.  She was evaluated by Dr. Magdalen and then later by Dr. Loel.  She had MRI of her left foot which showed partial tearing and tendinopathy of the peroneus brevis below the lateral malleolus and extending to the left fifth metatarsal base.  She also had mild distal tibialis posterior tenosynovitis.  She had repair of the longitudinal split tear of the peroneus brevis tendon of the left foot with PRP injection in July.  She states that she was on a scooter for some time and on light duty.  She also had physical therapy.  She gradually resumed her activity.  She states in the first week of December she had a severe flare with pain and discomfort in all of her joints. It lasted for about 1 week.  She states she could not see visible swelling.  She was seen by Dr. Kennieth on July 27, 2024.  According to the note he diagnosed her with inflammatory capsulitis sinus tarsi left.  She had a cortisone injection in the sinus tarsi of the left foot.  She continues to have lower back pain and uses a heating pad at nighttime.    Activities of Daily Living:  Patient reports morning stiffness for a few minutes.   Patient Reports nocturnal pain.  Difficulty dressing/grooming: Denies Difficulty climbing stairs: Denies Difficulty getting out of chair: Denies Difficulty using hands for taps, buttons, cutlery, and/or writing: Reports  Review of Systems   Constitutional:  Positive for fatigue.  HENT:  Negative for mouth sores and mouth dryness.   Eyes:  Negative for dryness.  Respiratory:  Negative for shortness of breath.   Cardiovascular:  Negative for chest pain and palpitations.  Gastrointestinal:  Negative for blood in stool, constipation and diarrhea.  Endocrine: Negative for increased urination.  Genitourinary:  Negative for involuntary urination.  Musculoskeletal:  Positive for joint pain, joint pain, myalgias, morning stiffness and myalgias. Negative for joint swelling, muscle weakness and muscle tenderness.  Skin:  Positive for hair loss. Negative for color change, rash and sensitivity to sunlight.  Allergic/Immunologic: Negative for susceptible to infections.  Neurological:  Negative for dizziness and headaches.  Hematological:  Negative for swollen glands.  Psychiatric/Behavioral:  Positive for sleep disturbance. Negative for depressed mood. The patient is not nervous/anxious.     PMFS History:  Patient Active Problem List   Diagnosis Date Noted   Primary osteoarthritis of both hands 09/09/2023   Primary osteoarthritis of both knees 09/09/2023   Primary osteoarthritis of both feet 09/09/2023   Arthropathy of lumbar facet joint 09/09/2023   Other idiopathic scoliosis, lumbar region 09/09/2023   Fibromyalgia syndrome 09/09/2023   History of multiple miscarriages 09/09/2023   Irregular uterine bleeding 05/07/2023   Prediabetes 06/21/2019   Ear itch 03/30/2019   Tonsillar calculus 03/30/2019  Menorrhagia with regular cycle 12/28/2013   Anxiety 02/07/2013   Dysmenorrhea 02/07/2013    Past Medical History:  Diagnosis Date   Arthritis    RA   Fibroids    Genital herpes    no outbreak in 10yrs   Trichomonas infection    Urticaria     Family History  Problem Relation Age of Onset   Healthy Mother    Sickle cell trait Mother    Healthy Father    Sickle cell trait Sister    Sickle cell trait Brother    Healthy  Daughter    Healthy Son    Healthy Son    Other Neg Hx    Past Surgical History:  Procedure Laterality Date   DILATATION & CURETTAGE/HYSTEROSCOPY WITH MYOSURE N/A 10/22/2021   Procedure: DILATATION & CURETTAGE/HYSTEROSCOPY WITH MYOSURE;  Surgeon: Zina Jerilynn LABOR, MD;  Location: [facility];  Service: Gynecology;  Laterality: N/A;   DILATION AND CURETTAGE OF UTERUS  2020   DILATION AND EVACUATION N/A 07/09/2022   Procedure: DILATATION AND EVACUATION;  Surgeon: Alger Gong, MD;  Location: [facility];  Service: Gynecology;  Laterality: N/A;   INDUCED ABORTION     peroneal tendon repair left Left 03/07/2024   repair of split tear of peroneus brevis tendon with PRP injection   WISDOM TOOTH EXTRACTION     Social History   Tobacco Use   Smoking status: Former    Current packs/day: 0.00    Average packs/day: 1 pack/day for 4.0 years (4.0 ttl pk-yrs)    Types: Cigarettes    Start date: 02/07/2005    Quit date: 02/07/2009    Years since quitting: 15.5    Passive exposure: Never   Smokeless tobacco: Never  Vaping Use   Vaping status: Never Used  Substance Use Topics   Alcohol use: Not Currently   Drug use: No   Social History   Social History Narrative   Right handed     Immunization History  Administered Date(s) Administered   Rho (D) Immune Globulin  06/03/2012     Objective: Vital Signs: BP 132/87   Pulse 78   Temp 98.3 F (36.8 C)   Resp 16   Ht 5' 4 (1.626 m)   Wt 215 lb (97.5 kg)   BMI 36.90 kg/m    Physical Exam Vitals and nursing note reviewed.  Constitutional:      Appearance: She is well-developed.  HENT:     Head: Normocephalic and atraumatic.  Eyes:     Conjunctiva/sclera: Conjunctivae normal.  Cardiovascular:     Rate and Rhythm: Normal rate and regular rhythm.     Heart sounds: Normal heart sounds.  Pulmonary:     Effort: Pulmonary effort is normal.     Breath sounds: Normal breath sounds.  Abdominal:     General: Bowel sounds  are normal.     Palpations: Abdomen is soft.  Musculoskeletal:     Cervical back: Normal range of motion.  Lymphadenopathy:     Cervical: No cervical adenopathy.  Skin:    General: Skin is warm and dry.     Capillary Refill: Capillary refill takes less than 2 seconds.  Neurological:     Mental Status: She is alert and oriented to person, place, and time.  Psychiatric:        Behavior: Behavior normal.      Musculoskeletal Exam: Cervical, thoracic and lumbar spine were in good range of motion.  There was no SI joint tenderness.  Shoulder joints, elbow joints, wrist joints, MCPs, PIPs and DIPs were in good range of motion with no synovitis.  Hip joints and knee joints were in good range of motion without any warmth swelling or effusion.  There was no tenderness over right ankle or MTPs.  Her left ankle and foot was in a brace.   CDAI Exam: CDAI Score: -- Patient Global: --; Provider Global: -- Swollen: --; Tender: -- Joint Exam 08/08/2024   No joint exam has been documented for this visit   There is currently no information documented on the homunculus. Go to the Rheumatology activity and complete the homunculus joint exam.  Investigation: No additional findings.  Imaging: DG Foot 2 Views Left Result Date: 07/27/2024 Please see detailed radiograph report in office note.   Recent Labs: Lab Results  Component Value Date   WBC 8.9 10/24/2023   HGB 12.5 10/24/2023   PLT 276 10/24/2023   NA 136 10/24/2023   K 3.7 10/24/2023   CL 104 10/24/2023   CO2 25 10/24/2023   GLUCOSE 109 (H) 10/24/2023   BUN 14 10/24/2023   CREATININE 0.73 10/24/2023   BILITOT 0.2 09/09/2023   ALKPHOS 64 10/22/2021   AST 12 09/09/2023   ALT 10 09/09/2023   PROT 7.2 09/09/2023   ALBUMIN 4.1 10/22/2021   CALCIUM 8.3 (L) 10/24/2023   GFRAA 128 07/22/2020    Speciality Comments: No specialty comments available.  Procedures:  No procedures performed Allergies: Prilosec otc [omeprazole  magnesium] and Latex   Assessment / Plan:     Visit Diagnoses: Positive RNP antibody - +RNP, +TPO: -Patient complains of hair loss, fatigue and arthralgias.  Has no history of sicca symptoms, oral ulcers, malar rash, photosensitivity, Raynaud's, lymphadenopathy.  Plan: CBC with Differential/Platelet, Comprehensive metabolic panel with GFR, Protein / creatinine ratio, urine, ANA, Anti-DNA antibody, double-stranded, C3 and C4, Sedimentation rate, RNP Antibody  Primary osteoarthritis of both hands-she complains of increased pain and discomfort in her hands.  No synovitis was noted on the examination today.  Trochanteric bursitis of both hips-she continues to have some intermittent discomfort over the IT band.  Primary osteoarthritis of both knees-she had no warmth swelling or effusion in her knee Raynauds.  Patient states she had a flare about 2 weeks ago with increased pain and discomfort in all of her joints.  Primary osteoarthritis of both feet-she continues to have discomfort in left foot and ankle.  Pain in left foot -she was referred to podiatrist at the last visit.  She had surgery for longitudinal split tear of peroneus brevis tendon of the left foot with PRP injection in July.  She has been in a boot.  She also had a recent injection by Dr. Magdalen.- Plan: Rheumatoid factor, Cyclic citrul peptide antibody, IgG  Chronic midline low back pain with bilateral sciatica-she continues to have some lower back pain.  She uses a heating pad at night.  Arthropathy of lumbar facet joint  Other idiopathic scoliosis, lumbar region  Hair loss - referred to dermatology at the last visit.  Patient states her insurance was not accepted.  Will refer to dermatology again.- Plan: Ambulatory referral to Dermatology  Fibromyalgia syndrome-she continues to have generalized pain and discomfort.  She had hyperalgesia and positive tender points.  Need for regular exercise and stretching was advised.  Patient states  due to her left foot she has gained weight.  Patient requested a refill for methocarbamol  which was sent.  Side effects of methocarbamol  use were discussed.  Trapezius muscle spasm-motion exercises were advised.  History of multiple miscarriages - 3 miscarriages.  She is not planning pregnancy in the future.  She is on oral contraceptive pills now.  Dysmenorrhea  Prediabetes  Anxiety  Orders: Orders Placed This Encounter  Procedures   CBC with Differential/Platelet   Comprehensive metabolic panel with GFR   Protein / creatinine ratio, urine   ANA   Anti-DNA antibody, double-stranded   C3 and C4   Sedimentation rate   RNP Antibody   Rheumatoid factor   Cyclic citrul peptide antibody, IgG   Ambulatory referral to Dermatology   Meds ordered this encounter  Medications   methocarbamol  (ROBAXIN ) 500 MG tablet    Sig: Take 1 tablet (500 mg total) by mouth at bedtime as needed for muscle spasms.    Dispense:  30 tablet    Refill:  0     Follow-Up Instructions: Return in about 6 months (around 02/06/2025) for Osteoarthritis, +RNP.   Maya Nash, MD  Note - This record has been created using Animal nutritionist.  Chart creation errors have been sought, but may not always  have been located. Such creation errors do not reflect on  the standard of medical care.

## 2024-07-27 ENCOUNTER — Ambulatory Visit: Admitting: Podiatry

## 2024-07-27 ENCOUNTER — Encounter: Payer: Self-pay | Admitting: Podiatry

## 2024-07-27 ENCOUNTER — Ambulatory Visit

## 2024-07-27 DIAGNOSIS — S86312D Strain of muscle(s) and tendon(s) of peroneal muscle group at lower leg level, left leg, subsequent encounter: Secondary | ICD-10-CM

## 2024-07-27 DIAGNOSIS — M216X2 Other acquired deformities of left foot: Secondary | ICD-10-CM | POA: Diagnosis not present

## 2024-07-27 DIAGNOSIS — M7752 Other enthesopathy of left foot: Secondary | ICD-10-CM

## 2024-07-27 DIAGNOSIS — M216X1 Other acquired deformities of right foot: Secondary | ICD-10-CM | POA: Diagnosis not present

## 2024-07-27 MED ORDER — TRIAMCINOLONE ACETONIDE 10 MG/ML IJ SUSP
10.0000 mg | Freq: Once | INTRAMUSCULAR | Status: AC
  Administered 2024-07-27: 10 mg via INTRA_ARTICULAR

## 2024-07-30 NOTE — Progress Notes (Signed)
 Subjective:   Patient ID: Hannah Fleming, female   DOB: 43 y.o.   MRN: [id number]   HPI Patient states she has developed a lot of pain in a different area on her left foot and she does have significant foot structural issues with acquired deformities and history of tendon repair   ROS      Objective:  Physical Exam  Neurovascular status unchanged significant acquired structural deformities of both feet with flattening of the arch and stress from having had to have the tendon repair left.  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  At this point I did sterile prep and I injected the sinus tarsi left 3 mg Kenalog  5 mg Xylocaine  and applied sterile dressing.  We then had pedorthist evaluation and casted for functional orthotic devices to reduce the stress occurring on both feet of a chronic nature.  Will be seen back when returned or earlier if needed.  X-rays do not indicate fracture do indicate compression of the arch no indications of advanced arthritis

## 2024-08-08 ENCOUNTER — Ambulatory Visit: Attending: Rheumatology | Admitting: Rheumatology

## 2024-08-08 ENCOUNTER — Encounter: Payer: Self-pay | Admitting: Rheumatology

## 2024-08-08 VITALS — BP 132/87 | HR 78 | Temp 98.3°F | Resp 16 | Ht 64.0 in | Wt 215.0 lb

## 2024-08-08 DIAGNOSIS — L659 Nonscarring hair loss, unspecified: Secondary | ICD-10-CM

## 2024-08-08 DIAGNOSIS — M7061 Trochanteric bursitis, right hip: Secondary | ICD-10-CM

## 2024-08-08 DIAGNOSIS — G8929 Other chronic pain: Secondary | ICD-10-CM | POA: Diagnosis present

## 2024-08-08 DIAGNOSIS — R7689 Other specified abnormal immunological findings in serum: Secondary | ICD-10-CM | POA: Insufficient documentation

## 2024-08-08 DIAGNOSIS — N96 Recurrent pregnancy loss: Secondary | ICD-10-CM

## 2024-08-08 DIAGNOSIS — M19071 Primary osteoarthritis, right ankle and foot: Secondary | ICD-10-CM | POA: Insufficient documentation

## 2024-08-08 DIAGNOSIS — R7303 Prediabetes: Secondary | ICD-10-CM | POA: Diagnosis present

## 2024-08-08 DIAGNOSIS — M7062 Trochanteric bursitis, left hip: Secondary | ICD-10-CM | POA: Diagnosis present

## 2024-08-08 DIAGNOSIS — F419 Anxiety disorder, unspecified: Secondary | ICD-10-CM

## 2024-08-08 DIAGNOSIS — M170 Bilateral primary osteoarthritis of knee: Secondary | ICD-10-CM

## 2024-08-08 DIAGNOSIS — N946 Dysmenorrhea, unspecified: Secondary | ICD-10-CM | POA: Insufficient documentation

## 2024-08-08 DIAGNOSIS — M62838 Other muscle spasm: Secondary | ICD-10-CM | POA: Diagnosis not present

## 2024-08-08 DIAGNOSIS — M19042 Primary osteoarthritis, left hand: Secondary | ICD-10-CM | POA: Diagnosis present

## 2024-08-08 DIAGNOSIS — M79672 Pain in left foot: Secondary | ICD-10-CM

## 2024-08-08 DIAGNOSIS — M5441 Lumbago with sciatica, right side: Secondary | ICD-10-CM | POA: Insufficient documentation

## 2024-08-08 DIAGNOSIS — M797 Fibromyalgia: Secondary | ICD-10-CM

## 2024-08-08 DIAGNOSIS — M47816 Spondylosis without myelopathy or radiculopathy, lumbar region: Secondary | ICD-10-CM | POA: Diagnosis not present

## 2024-08-08 DIAGNOSIS — M5442 Lumbago with sciatica, left side: Secondary | ICD-10-CM | POA: Insufficient documentation

## 2024-08-08 DIAGNOSIS — M19041 Primary osteoarthritis, right hand: Secondary | ICD-10-CM | POA: Diagnosis not present

## 2024-08-08 DIAGNOSIS — M19072 Primary osteoarthritis, left ankle and foot: Secondary | ICD-10-CM | POA: Insufficient documentation

## 2024-08-08 DIAGNOSIS — M4126 Other idiopathic scoliosis, lumbar region: Secondary | ICD-10-CM

## 2024-08-08 MED ORDER — METHOCARBAMOL 500 MG PO TABS: 500.0000 mg | ORAL_TABLET | Freq: Every evening | ORAL | 0 refills | Status: AC | PRN

## 2024-08-09 ENCOUNTER — Telehealth: Payer: Self-pay

## 2024-08-09 NOTE — Telephone Encounter (Signed)
 Patient's orthotics are in and she is seeing you on 08/14/2024. You can find them on the wall in front of the computer.

## 2024-08-10 ENCOUNTER — Ambulatory Visit: Payer: Self-pay | Admitting: Rheumatology

## 2024-08-10 LAB — COMPREHENSIVE METABOLIC PANEL WITH GFR
AG Ratio: 1.5 (calc) (ref 1.0–2.5)
ALT: 10 U/L (ref 6–29)
AST: 9 U/L — ABNORMAL LOW (ref 10–30)
Albumin: 4.3 g/dL (ref 3.6–5.1)
Alkaline phosphatase (APISO): 57 U/L (ref 31–125)
BUN: 17 mg/dL (ref 7–25)
CO2: 29 mmol/L (ref 20–32)
Calcium: 9.1 mg/dL (ref 8.6–10.2)
Chloride: 104 mmol/L (ref 98–110)
Creat: 0.81 mg/dL (ref 0.50–0.99)
Globulin: 2.8 g/dL (ref 1.9–3.7)
Glucose, Bld: 88 mg/dL (ref 65–99)
Potassium: 4 mmol/L (ref 3.5–5.3)
Sodium: 138 mmol/L (ref 135–146)
Total Bilirubin: 0.3 mg/dL (ref 0.2–1.2)
Total Protein: 7.1 g/dL (ref 6.1–8.1)
eGFR: 93 mL/min/1.73m2 (ref >=60)

## 2024-08-10 LAB — CBC WITH DIFFERENTIAL/PLATELET
Absolute Lymphocytes: 3024 {cells}/uL (ref 850–3900)
Absolute Monocytes: 605 {cells}/uL (ref 200–950)
Basophils Absolute: 38 {cells}/uL (ref 0–200)
Basophils Relative: 0.4 %
Eosinophils Absolute: 106 {cells}/uL (ref 15–500)
Eosinophils Relative: 1.1 %
HCT: 39.9 % (ref 35.9–46.0)
Hemoglobin: 12.7 g/dL (ref 11.7–15.5)
MCH: 28.7 pg (ref 27.0–33.0)
MCHC: 31.8 g/dL (ref 31.6–35.4)
MCV: 90.1 fL (ref 81.4–101.7)
MPV: 11.3 fL (ref 7.5–12.5)
Monocytes Relative: 6.3 %
Neutro Abs: 5827 {cells}/uL (ref 1500–7800)
Neutrophils Relative %: 60.7 %
Platelets: 292 Thousand/uL (ref 140–400)
RBC: 4.43 Million/uL (ref 3.80–5.10)
RDW: 12.1 % (ref 11.0–15.0)
Total Lymphocyte: 31.5 %
WBC: 9.6 Thousand/uL (ref 3.8–10.8)

## 2024-08-10 LAB — CYCLIC CITRUL PEPTIDE ANTIBODY, IGG: Cyclic Citrullin Peptide Ab: <16 U

## 2024-08-10 LAB — ANA: Anti Nuclear Antibody (ANA): NEGATIVE

## 2024-08-10 LAB — C3 AND C4
C3 Complement: 145 mg/dL (ref 83–193)
C4 Complement: 31 mg/dL (ref 15–57)

## 2024-08-10 LAB — ANTI-DNA ANTIBODY, DOUBLE-STRANDED: ds DNA Ab: <1 [IU]/mL

## 2024-08-10 LAB — PROTEIN / CREATININE RATIO, URINE
Creatinine, Urine: 165 mg/dL (ref 20–275)
Protein/Creat Ratio: 67 mg/g{creat} (ref 24–184)
Protein/Creatinine Ratio: 0.067 mg/mg{creat} (ref 0.024–0.184)
Total Protein, Urine: 11 mg/dL (ref 5–24)

## 2024-08-10 LAB — RHEUMATOID FACTOR: Rheumatoid fact SerPl-aCnc: 10 [IU]/mL (ref <14)

## 2024-08-10 LAB — RNP ANTIBODY: Ribonucleic Protein(ENA) Antibody, IgG: 3.3 AI — AB

## 2024-08-10 LAB — SEDIMENTATION RATE: Sed Rate: 6 mm/h (ref 0–20)

## 2024-08-10 NOTE — Progress Notes (Signed)
 CBC and CMP normal.  ANA negative, double-stranded DNA negative, RNP remains positive.  C3-C4 normal, sed rate normal, RF negative.  Anti-CCP pending.  Labs do not indicate an autoimmune disease flare.

## 2024-08-10 NOTE — Progress Notes (Signed)
Anti-CCP is negative.

## 2024-08-14 ENCOUNTER — Encounter: Payer: Self-pay | Admitting: Podiatry

## 2024-08-14 ENCOUNTER — Ambulatory Visit: Admitting: Podiatry

## 2024-08-14 DIAGNOSIS — S86312D Strain of muscle(s) and tendon(s) of peroneal muscle group at lower leg level, left leg, subsequent encounter: Secondary | ICD-10-CM | POA: Diagnosis not present

## 2024-08-14 DIAGNOSIS — L905 Scar conditions and fibrosis of skin: Secondary | ICD-10-CM

## 2024-08-14 NOTE — Progress Notes (Signed)
 "   Chief Complaint  Patient presents with   Orthotics Pic up    Orthotic pick up and ankle follow up    HPI: 43 y.o. female presents today for follow-up to pick up orthotics and also for evaluation of her left lateral ankle.  She noted that she recently saw Dr. Magdalen, Sr, and was administered a cortisone injection into the left sinus tarsi joint.  She is having pain in the arch on the plantar aspect of the left foot.  Denies trauma.  She has been more active with moving into her new residence and occasionally will still wear her ankle brace for additional support  Past Medical History:  Diagnosis Date   Arthritis    RA   Fibroids    Genital herpes    no outbreak in 71yrs   Trichomonas infection    Urticaria    Past Surgical History:  Procedure Laterality Date   DILATATION & CURETTAGE/HYSTEROSCOPY WITH MYOSURE N/A 10/22/2021   Procedure: DILATATION & CURETTAGE/HYSTEROSCOPY WITH MYOSURE;  Surgeon: Zina Jerilynn LABOR, MD;  Location: [facility];  Service: Gynecology;  Laterality: N/A;   DILATION AND CURETTAGE OF UTERUS  2020   DILATION AND EVACUATION N/A 07/09/2022   Procedure: DILATATION AND EVACUATION;  Surgeon: Alger Gong, MD;  Location: [facility];  Service: Gynecology;  Laterality: N/A;   INDUCED ABORTION     peroneal tendon repair left Left 03/07/2024   repair of split tear of peroneus brevis tendon with PRP injection   WISDOM TOOTH EXTRACTION     Allergies[1]   Physical Exam: Palpable pedal pulses.  Minimal to no edema to the lateral aspect of the left ankle along the peroneal tendon area.  The proximal/superior 50% of the scar is within normal limits.  The distal 50% of the scar has a slight cordlike texture still present.  No pain on palpation of the peroneal tendons.  No pain with resisted eversion of the left foot.  No gaps or nodules noted within the peroneal tendons on the left.  Manual muscle testing 5/5  Assessment/Plan of Care: 1. Tear of peroneal  tendon, left, subsequent encounter   2. Scar tissue     Patient plans on resuming physical therapy in the new year.  They can work on the scar tissue along the lateral left ankle as well as the plantar fascia on the left foot.  She will can continue with the current scar sheets for the incision each night for the next 30 days.  Focus most attention towards the distal 50% of the scar.  She was dispensed her custom molded orthotics which have already been billed to her insurance.  They are a good fit to her feet.  She was instructed on how to break these in by wearing them small portions throughout each day for up to 2 weeks.  Will keep her at light duty until February 2.  If physical therapy feels that she can return to work sooner, they can release her with a new note or call our office to have us  release her sooner.  Follow-up as needed   Awanda CHARM Imperial, DPM, FACFAS [facility]     [address], [place] [address]  Office [phone]  Fax [phone]    [1]  Allergies Allergen Reactions   Prilosec Otc [Omeprazole Magnesium] Hives   Latex Itching and Rash   "

## 2025-02-06 ENCOUNTER — Ambulatory Visit: Admitting: Rheumatology
